# Patient Record
Sex: Female | Born: 1955 | Hispanic: No | Marital: Married | Smoking: Former smoker
Health system: Southern US, Community
[De-identification: ages and names within clinical notes are randomized; demographics above are authoritative.]

## PROBLEM LIST (undated history)

## (undated) DIAGNOSIS — T8859XA Other complications of anesthesia, initial encounter: Secondary | ICD-10-CM

## (undated) DIAGNOSIS — F419 Anxiety disorder, unspecified: Secondary | ICD-10-CM

## (undated) DIAGNOSIS — I10 Essential (primary) hypertension: Secondary | ICD-10-CM

## (undated) DIAGNOSIS — I509 Heart failure, unspecified: Secondary | ICD-10-CM

## (undated) DIAGNOSIS — I341 Nonrheumatic mitral (valve) prolapse: Secondary | ICD-10-CM

## (undated) DIAGNOSIS — I5032 Chronic diastolic (congestive) heart failure: Secondary | ICD-10-CM

## (undated) DIAGNOSIS — T1491XA Suicide attempt, initial encounter: Secondary | ICD-10-CM

## (undated) DIAGNOSIS — K219 Gastro-esophageal reflux disease without esophagitis: Secondary | ICD-10-CM

## (undated) DIAGNOSIS — O24410 Gestational diabetes mellitus in pregnancy, diet controlled: Secondary | ICD-10-CM

## (undated) DIAGNOSIS — R55 Syncope and collapse: Secondary | ICD-10-CM

## (undated) DIAGNOSIS — F32A Depression, unspecified: Secondary | ICD-10-CM

## (undated) DIAGNOSIS — R7302 Impaired glucose tolerance (oral): Secondary | ICD-10-CM

## (undated) DIAGNOSIS — I639 Cerebral infarction, unspecified: Secondary | ICD-10-CM

## (undated) DIAGNOSIS — T4145XA Adverse effect of unspecified anesthetic, initial encounter: Secondary | ICD-10-CM

## (undated) DIAGNOSIS — E785 Hyperlipidemia, unspecified: Secondary | ICD-10-CM

## (undated) DIAGNOSIS — T7840XA Allergy, unspecified, initial encounter: Secondary | ICD-10-CM

## (undated) DIAGNOSIS — J449 Chronic obstructive pulmonary disease, unspecified: Secondary | ICD-10-CM

## (undated) DIAGNOSIS — IMO0002 Reserved for concepts with insufficient information to code with codable children: Secondary | ICD-10-CM

## (undated) DIAGNOSIS — M51369 Other intervertebral disc degeneration, lumbar region without mention of lumbar back pain or lower extremity pain: Secondary | ICD-10-CM

## (undated) DIAGNOSIS — D649 Anemia, unspecified: Secondary | ICD-10-CM

## (undated) DIAGNOSIS — F319 Bipolar disorder, unspecified: Secondary | ICD-10-CM

## (undated) DIAGNOSIS — F329 Major depressive disorder, single episode, unspecified: Secondary | ICD-10-CM

## (undated) DIAGNOSIS — O24919 Unspecified diabetes mellitus in pregnancy, unspecified trimester: Secondary | ICD-10-CM

## (undated) DIAGNOSIS — M5136 Other intervertebral disc degeneration, lumbar region: Secondary | ICD-10-CM

## (undated) HISTORY — DX: Other intervertebral disc degeneration, lumbar region without mention of lumbar back pain or lower extremity pain: M51.369

## (undated) HISTORY — PX: BLADDER SUSPENSION: SHX72

## (undated) HISTORY — DX: Gestational diabetes mellitus in pregnancy, diet controlled: O24.410

## (undated) HISTORY — DX: Chronic diastolic (congestive) heart failure: I50.32

## (undated) HISTORY — PX: AUGMENTATION MAMMAPLASTY: SUR837

## (undated) HISTORY — DX: Allergy, unspecified, initial encounter: T78.40XA

## (undated) HISTORY — DX: Bipolar disorder, unspecified: F31.9

## (undated) HISTORY — DX: Cerebral infarction, unspecified: I63.9

## (undated) HISTORY — DX: Anemia, unspecified: D64.9

## (undated) HISTORY — DX: Suicide attempt, initial encounter: T14.91XA

## (undated) HISTORY — DX: Anxiety disorder, unspecified: F41.9

## (undated) HISTORY — DX: Syncope and collapse: R55

## (undated) HISTORY — DX: Heart failure, unspecified: I50.9

## (undated) HISTORY — DX: Hyperlipidemia, unspecified: E78.5

## (undated) HISTORY — PX: REPAIR PERONEAL TENDONS ANKLE: SUR1201

## (undated) HISTORY — DX: Impaired glucose tolerance (oral): R73.02

## (undated) HISTORY — DX: Essential (primary) hypertension: I10

## (undated) HISTORY — DX: Other intervertebral disc degeneration, lumbar region: M51.36

## (undated) HISTORY — PX: PARTIAL HYSTERECTOMY: SHX80

## (undated) HISTORY — DX: Depression, unspecified: F32.A

## (undated) HISTORY — PX: GASTRIC FUNDOPLICATION: SHX226

## (undated) HISTORY — DX: Chronic obstructive pulmonary disease, unspecified: J44.9

## (undated) HISTORY — PX: GASTRIC BYPASS: SHX52

---
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
## 1977-10-07 HISTORY — PX: TOTAL ABDOMINAL HYSTERECTOMY: SHX209

## 1977-10-07 HISTORY — PX: PARTIAL HYSTERECTOMY: SHX80

## 1999-06-03 ENCOUNTER — Emergency Department (HOSPITAL_COMMUNITY): Admission: EM | Admit: 1999-06-03 | Discharge: 1999-06-03 | Payer: Self-pay | Admitting: Emergency Medicine

## 2000-10-07 HISTORY — PX: BLADDER SUSPENSION: SHX72

## 2001-10-07 HISTORY — PX: AUGMENTATION MAMMAPLASTY: SUR837

## 2006-10-07 HISTORY — PX: REPAIR PERONEAL TENDONS ANKLE: SUR1201

## 2007-07-15 ENCOUNTER — Ambulatory Visit: Payer: Self-pay | Admitting: Family Medicine

## 2007-07-15 DIAGNOSIS — R5381 Other malaise: Secondary | ICD-10-CM | POA: Insufficient documentation

## 2007-07-15 DIAGNOSIS — R5383 Other fatigue: Secondary | ICD-10-CM

## 2007-07-15 DIAGNOSIS — E785 Hyperlipidemia, unspecified: Secondary | ICD-10-CM | POA: Insufficient documentation

## 2007-07-20 ENCOUNTER — Encounter: Payer: Self-pay | Admitting: Family Medicine

## 2007-07-21 ENCOUNTER — Encounter: Payer: Self-pay | Admitting: Family Medicine

## 2007-07-21 LAB — CONVERTED CEMR LAB
ALT: 9 units/L (ref 0–35)
AST: 13 units/L (ref 0–37)
Albumin: 4.1 g/dL (ref 3.5–5.2)
Alkaline Phosphatase: 63 units/L (ref 39–117)
BUN: 9 mg/dL (ref 6–23)
CO2: 23 meq/L (ref 19–32)
Calcium: 9.2 mg/dL (ref 8.4–10.5)
Chloride: 103 meq/L (ref 96–112)
Cholesterol, target level: 200 mg/dL
Cholesterol: 190 mg/dL (ref 0–200)
Creatinine, Ser: 0.76 mg/dL (ref 0.40–1.20)
Glucose, Bld: 100 mg/dL — ABNORMAL HIGH (ref 70–99)
HCT: 44.9 % (ref 36.0–46.0)
HDL goal, serum: 40 mg/dL
HDL: 50 mg/dL (ref >39)
Hemoglobin: 14.7 g/dL (ref 12.0–15.0)
LDL Cholesterol: 115 mg/dL — ABNORMAL HIGH (ref 0–99)
LDL Goal: 160 mg/dL
MCHC: 32.7 g/dL (ref 30.0–36.0)
MCV: 89.4 fL (ref 78.0–100.0)
Platelets: 299 10*3/uL (ref 150–400)
Potassium: 4.2 meq/L (ref 3.5–5.3)
RBC: 5.02 M/uL (ref 3.87–5.11)
RDW: 12.8 % (ref 11.5–14.0)
Sodium: 138 meq/L (ref 135–145)
TSH: 0.822 microintl units/mL (ref 0.350–5.50)
Total Bilirubin: 0.5 mg/dL (ref 0.3–1.2)
Total CHOL/HDL Ratio: 3.8
Total Protein: 6.8 g/dL (ref 6.0–8.3)
Triglycerides: 124 mg/dL (ref <150)
VLDL: 25 mg/dL (ref 0–40)
Vitamin B-12: 331 pg/mL (ref 211–911)
WBC: 10.4 10*3/uL (ref 4.0–10.5)

## 2007-07-24 ENCOUNTER — Ambulatory Visit: Payer: Self-pay | Admitting: Family Medicine

## 2007-12-14 ENCOUNTER — Encounter: Payer: Self-pay | Admitting: Family Medicine

## 2007-12-22 ENCOUNTER — Telehealth: Payer: Self-pay | Admitting: Family Medicine

## 2007-12-22 ENCOUNTER — Ambulatory Visit: Payer: Self-pay | Admitting: Family Medicine

## 2007-12-22 DIAGNOSIS — R7309 Other abnormal glucose: Secondary | ICD-10-CM | POA: Insufficient documentation

## 2008-01-06 ENCOUNTER — Telehealth (INDEPENDENT_AMBULATORY_CARE_PROVIDER_SITE_OTHER): Payer: Self-pay | Admitting: *Deleted

## 2008-01-06 ENCOUNTER — Encounter: Payer: Self-pay | Admitting: Family Medicine

## 2008-01-07 ENCOUNTER — Ambulatory Visit: Payer: Self-pay | Admitting: Family Medicine

## 2008-01-11 ENCOUNTER — Telehealth: Payer: Self-pay | Admitting: Family Medicine

## 2008-01-11 ENCOUNTER — Encounter: Payer: Self-pay | Admitting: Family Medicine

## 2008-02-04 ENCOUNTER — Ambulatory Visit: Payer: Self-pay | Admitting: Family Medicine

## 2008-04-24 ENCOUNTER — Encounter: Payer: Self-pay | Admitting: Family Medicine

## 2008-05-05 ENCOUNTER — Telehealth: Payer: Self-pay | Admitting: Family Medicine

## 2008-06-22 ENCOUNTER — Encounter: Payer: Self-pay | Admitting: Family Medicine

## 2008-07-06 ENCOUNTER — Encounter: Admission: RE | Admit: 2008-07-06 | Discharge: 2008-07-06 | Payer: Self-pay | Admitting: Obstetrics and Gynecology

## 2008-07-29 ENCOUNTER — Encounter: Payer: Self-pay | Admitting: Family Medicine

## 2008-07-31 LAB — CONVERTED CEMR LAB
BUN: 9 mg/dL (ref 6–23)
CO2: 22 meq/L (ref 19–32)
Calcium: 9.2 mg/dL (ref 8.4–10.5)
Chloride: 104 meq/L (ref 96–112)
Creatinine, Ser: 0.69 mg/dL (ref 0.40–1.20)
Glucose, Bld: 155 mg/dL — ABNORMAL HIGH (ref 70–99)
Potassium: 3.5 meq/L (ref 3.5–5.3)
Sodium: 138 meq/L (ref 135–145)

## 2008-08-03 ENCOUNTER — Encounter: Admission: RE | Admit: 2008-08-03 | Discharge: 2008-08-03 | Payer: Self-pay | Admitting: Family Medicine

## 2008-08-03 ENCOUNTER — Ambulatory Visit: Payer: Self-pay | Admitting: Family Medicine

## 2008-08-03 DIAGNOSIS — M79609 Pain in unspecified limb: Secondary | ICD-10-CM | POA: Insufficient documentation

## 2008-08-03 LAB — CONVERTED CEMR LAB: Hgb A1c MFr Bld: 5.6 %

## 2008-08-21 ENCOUNTER — Encounter: Payer: Self-pay | Admitting: Family Medicine

## 2008-08-31 ENCOUNTER — Telehealth: Payer: Self-pay | Admitting: Family Medicine

## 2008-08-31 DIAGNOSIS — G479 Sleep disorder, unspecified: Secondary | ICD-10-CM | POA: Insufficient documentation

## 2008-10-27 ENCOUNTER — Encounter: Payer: Self-pay | Admitting: Family Medicine

## 2008-11-07 ENCOUNTER — Telehealth: Payer: Self-pay | Admitting: Family Medicine

## 2008-12-13 ENCOUNTER — Ambulatory Visit: Payer: Self-pay | Admitting: Family Medicine

## 2008-12-13 LAB — CONVERTED CEMR LAB
Creatinine,U: 200 mg/dL
Hgb A1c MFr Bld: 5.9 %
Microalbumin U total vol: 80 mg/L

## 2008-12-21 ENCOUNTER — Telehealth: Payer: Self-pay | Admitting: Family Medicine

## 2008-12-23 ENCOUNTER — Encounter: Payer: Self-pay | Admitting: Family Medicine

## 2009-05-02 ENCOUNTER — Ambulatory Visit: Payer: Self-pay | Admitting: Family Medicine

## 2009-05-03 ENCOUNTER — Encounter: Payer: Self-pay | Admitting: Family Medicine

## 2009-05-04 LAB — CONVERTED CEMR LAB
ALT: 9 units/L (ref 0–35)
AST: 11 units/L (ref 0–37)
Albumin: 4 g/dL (ref 3.5–5.2)
Alkaline Phosphatase: 90 units/L (ref 39–117)
BUN: 7 mg/dL (ref 6–23)
CO2: 23 meq/L (ref 19–32)
Calcium: 9.5 mg/dL (ref 8.4–10.5)
Chloride: 102 meq/L (ref 96–112)
Cholesterol: 199 mg/dL (ref 0–200)
Creatinine, Ser: 0.76 mg/dL (ref 0.40–1.20)
Glucose, Bld: 103 mg/dL — ABNORMAL HIGH (ref 70–99)
HDL: 38 mg/dL — ABNORMAL LOW (ref >39)
LDL Cholesterol: 127 mg/dL — ABNORMAL HIGH (ref 0–99)
Potassium: 4 meq/L (ref 3.5–5.3)
Sodium: 138 meq/L (ref 135–145)
TSH: 1.465 microintl units/mL (ref 0.350–4.500)
Total Bilirubin: 0.5 mg/dL (ref 0.3–1.2)
Total CHOL/HDL Ratio: 5.2
Total Protein: 6.6 g/dL (ref 6.0–8.3)
Triglycerides: 172 mg/dL — ABNORMAL HIGH (ref <150)
VLDL: 34 mg/dL (ref 0–40)

## 2009-05-05 ENCOUNTER — Telehealth: Payer: Self-pay | Admitting: Family Medicine

## 2009-05-17 ENCOUNTER — Encounter: Payer: Self-pay | Admitting: Cardiology

## 2009-05-17 ENCOUNTER — Ambulatory Visit: Payer: Self-pay | Admitting: Cardiology

## 2009-05-17 DIAGNOSIS — F319 Bipolar disorder, unspecified: Secondary | ICD-10-CM | POA: Insufficient documentation

## 2009-05-17 DIAGNOSIS — R0602 Shortness of breath: Secondary | ICD-10-CM | POA: Insufficient documentation

## 2009-05-17 DIAGNOSIS — R079 Chest pain, unspecified: Secondary | ICD-10-CM | POA: Insufficient documentation

## 2009-05-17 HISTORY — DX: Bipolar disorder, unspecified: F31.9

## 2009-05-22 ENCOUNTER — Ambulatory Visit: Payer: Self-pay | Admitting: Cardiology

## 2009-05-22 ENCOUNTER — Telehealth: Payer: Self-pay | Admitting: Cardiology

## 2009-05-25 ENCOUNTER — Telehealth (INDEPENDENT_AMBULATORY_CARE_PROVIDER_SITE_OTHER): Payer: Self-pay | Admitting: Radiology

## 2009-05-29 ENCOUNTER — Encounter: Payer: Self-pay | Admitting: Cardiology

## 2009-05-29 ENCOUNTER — Ambulatory Visit: Payer: Self-pay

## 2009-05-31 ENCOUNTER — Encounter: Payer: Self-pay | Admitting: Cardiology

## 2009-05-31 ENCOUNTER — Ambulatory Visit: Payer: Self-pay

## 2009-06-02 ENCOUNTER — Ambulatory Visit: Payer: Self-pay | Admitting: Family Medicine

## 2009-06-02 DIAGNOSIS — L680 Hirsutism: Secondary | ICD-10-CM | POA: Insufficient documentation

## 2009-07-13 ENCOUNTER — Encounter (INDEPENDENT_AMBULATORY_CARE_PROVIDER_SITE_OTHER): Payer: Self-pay | Admitting: *Deleted

## 2009-08-09 ENCOUNTER — Encounter: Payer: Self-pay | Admitting: Family Medicine

## 2009-08-23 ENCOUNTER — Telehealth: Payer: Self-pay | Admitting: Family Medicine

## 2009-09-06 ENCOUNTER — Ambulatory Visit: Payer: Self-pay | Admitting: Cardiology

## 2009-09-06 ENCOUNTER — Encounter: Payer: Self-pay | Admitting: Cardiology

## 2009-09-07 ENCOUNTER — Encounter: Payer: Self-pay | Admitting: Cardiology

## 2009-09-11 ENCOUNTER — Ambulatory Visit: Payer: Self-pay | Admitting: Cardiology

## 2009-09-15 ENCOUNTER — Encounter (INDEPENDENT_AMBULATORY_CARE_PROVIDER_SITE_OTHER): Payer: Self-pay | Admitting: *Deleted

## 2009-10-11 ENCOUNTER — Encounter: Payer: Self-pay | Admitting: Cardiology

## 2009-10-11 ENCOUNTER — Ambulatory Visit: Payer: Self-pay | Admitting: Cardiology

## 2009-10-20 ENCOUNTER — Encounter: Payer: Self-pay | Admitting: Cardiology

## 2009-10-20 ENCOUNTER — Encounter: Payer: Self-pay | Admitting: Family Medicine

## 2009-11-13 ENCOUNTER — Encounter: Payer: Self-pay | Admitting: Family Medicine

## 2010-03-16 ENCOUNTER — Encounter: Payer: Self-pay | Admitting: Family Medicine

## 2010-07-18 ENCOUNTER — Ambulatory Visit (HOSPITAL_COMMUNITY): Admission: RE | Admit: 2010-07-18 | Discharge: 2010-07-19 | Payer: Self-pay | Admitting: Obstetrics and Gynecology

## 2010-10-22 ENCOUNTER — Encounter (INDEPENDENT_AMBULATORY_CARE_PROVIDER_SITE_OTHER): Payer: Self-pay | Admitting: *Deleted

## 2010-10-22 ENCOUNTER — Telehealth: Payer: Self-pay | Admitting: Cardiology

## 2010-10-22 ENCOUNTER — Inpatient Hospital Stay (HOSPITAL_COMMUNITY)
Admission: EM | Admit: 2010-10-22 | Discharge: 2010-10-23 | Payer: Self-pay | Source: Home / Self Care | Attending: Cardiology | Admitting: Cardiology

## 2010-10-22 ENCOUNTER — Ambulatory Visit: Admit: 2010-10-22 | Payer: Self-pay | Admitting: Cardiology

## 2010-10-24 LAB — CBC
HCT: 43.4 % (ref 36.0–46.0)
HCT: 37.9 % (ref 36.0–46.0)
Hemoglobin: 14.4 g/dL (ref 12.0–15.0)
Hemoglobin: 12.3 g/dL (ref 12.0–15.0)
MCH: 29 pg (ref 26.0–34.0)
MCH: 28.3 pg (ref 26.0–34.0)
MCHC: 33.2 g/dL (ref 30.0–36.0)
MCHC: 32.5 g/dL (ref 30.0–36.0)
MCV: 87.3 fL (ref 78.0–100.0)
MCV: 87.1 fL (ref 78.0–100.0)
Platelets: 272 10*3/uL (ref 150–400)
Platelets: 235 10*3/uL (ref 150–400)
RBC: 4.97 MIL/uL (ref 3.87–5.11)
RBC: 4.35 MIL/uL (ref 3.87–5.11)
RDW: 13.2 % (ref 11.5–15.5)
RDW: 13.3 % (ref 11.5–15.5)
WBC: 11.8 10*3/uL — ABNORMAL HIGH (ref 4.0–10.5)
WBC: 8.5 10*3/uL (ref 4.0–10.5)

## 2010-10-24 LAB — BASIC METABOLIC PANEL
BUN: 5 mg/dL — ABNORMAL LOW (ref 6–23)
BUN: 9 mg/dL (ref 6–23)
CO2: 27 mEq/L (ref 19–32)
CO2: 29 mEq/L (ref 19–32)
Calcium: 9.3 mg/dL (ref 8.4–10.5)
Calcium: 9 mg/dL (ref 8.4–10.5)
Chloride: 105 mEq/L (ref 96–112)
Chloride: 105 mEq/L (ref 96–112)
Creatinine, Ser: 0.76 mg/dL (ref 0.4–1.2)
Creatinine, Ser: 0.82 mg/dL (ref 0.4–1.2)
GFR calc Af Amer: >60 mL/min (ref >60)
GFR calc Af Amer: >60 mL/min (ref >60)
GFR calc non Af Amer: >60 mL/min (ref >60)
GFR calc non Af Amer: >60 mL/min (ref >60)
Glucose, Bld: 97 mg/dL (ref 70–99)
Glucose, Bld: 104 mg/dL — ABNORMAL HIGH (ref 70–99)
Potassium: 3.7 mEq/L (ref 3.5–5.1)
Potassium: 3.4 mEq/L — ABNORMAL LOW (ref 3.5–5.1)
Sodium: 143 mEq/L (ref 135–145)
Sodium: 141 mEq/L (ref 135–145)

## 2010-10-24 LAB — DIFFERENTIAL
Basophils Absolute: 0.1 10*3/uL (ref 0.0–0.1)
Basophils Relative: 1 % (ref 0–1)
Eosinophils Absolute: 0.4 10*3/uL (ref 0.0–0.7)
Eosinophils Relative: 3 % (ref 0–5)
Lymphocytes Relative: 22 % (ref 12–46)
Lymphs Abs: 2.6 10*3/uL (ref 0.7–4.0)
Monocytes Absolute: 0.7 10*3/uL (ref 0.1–1.0)
Monocytes Relative: 6 % (ref 3–12)
Neutro Abs: 8.1 10*3/uL — ABNORMAL HIGH (ref 1.7–7.7)
Neutrophils Relative %: 68 % (ref 43–77)

## 2010-10-24 LAB — CK TOTAL AND CKMB (NOT AT ARMC)
CK, MB: 1.3 ng/mL (ref 0.3–4.0)
Relative Index: INVALID (ref 0.0–2.5)
Total CK: 86 U/L (ref 7–177)

## 2010-10-24 LAB — TROPONIN I
Troponin I: 0.02 ng/mL (ref 0.00–0.06)
Troponin I: <0.01 ng/mL (ref 0.00–0.06)

## 2010-10-24 LAB — TSH: TSH: 1.36 u[IU]/mL (ref 0.350–4.500)

## 2010-10-24 LAB — MRSA PCR SCREENING: MRSA by PCR: NEGATIVE

## 2010-10-28 ENCOUNTER — Encounter: Payer: Self-pay | Admitting: Cardiology

## 2010-11-06 NOTE — Letter (Signed)
Summary: [facility]  [facility]   Imported By: Lanelle Bal 04/12/2010 13:26:51  _____________________________________________________________________  External Attachment:    Type:   Image     Comment:   External Document

## 2010-11-06 NOTE — Letter (Signed)
Summary: [facility] General Surgery Note  [facility] General Surgery Note   Imported By: Roderic Ovens 02/07/2010 11:43:38  _____________________________________________________________________  External Attachment:    Type:   Image     Comment:   External Document

## 2010-11-06 NOTE — Assessment & Plan Note (Signed)
Summary: [facility]   Visit Type:  6 weeks follow up Primary Provider:  Linford Arnold, C  CC:  No complains.  History of Present Illness: Pleasant female recently seen for hypertension, shortness of breath and chest pain. A nuclear study was performed on 05/29/09 and showed normal perfusion and normal LV function with an ejection fraction of 64%. An echocardiogram was performed on May 29, 2009 and showed normal LV function. Renal Dopplers were technically difficult. There was no stenosis on the left and the right was not well visualized. I last saw her earlier this month we scheduled a CTA to rule out renal artery stenosis and it was negative. We also added hydralazine to her medical regimen. A BNP was also ordered for dyspnea and was normal. Since I last saw her she has some dyspnea on exertion relieved with rest. It is improved compared to previous. It is not associated with chest pain. There is no pedal edema. She has not had palpitations or syncope. She did have foot surgery recently.  Current Medications (verified): 1)  Adult Aspirin Ec Low Strength 81 Mg  Tbec (Aspirin) .... Take 1 Tablet By Mouth Once A Day 2)  Furosemide 20 Mg  Tabs (Furosemide) .... Take 1 Tablet By Mouth Two Times A Day 3)  Klonopin 1 Mg  Tabs (Clonazepam) .... Take 1 Tablet By Mouth Three Times A Day 4)  Geodon 80 Mg Caps (Ziprasidone Hcl) .... Take 1 Tablet By Mouth Two Times A Day 5)  Cymbalta 60 Mg Cpep (Duloxetine Hcl) .... Take 1 Tablet By Mouth Once A Day 6)  Deplin 7.5 Mg  Tabs (L-Methylfolate) .... Take One Tablet By Mouth Once A Day 7)  Guanfacine Hcl 2 Mg  Tabs (Guanfacine Hcl) .... Take One Tablet By Mouth At Bedtime 8)  Ambien Cr 12.5 Mg  Tbcr (Zolpidem Tartrate) .... Take One Tablet By Mouth At Bedtime 9)  Azor 10-40 Mg Tabs (Amlodipine-Olmesartan) .... Take 1 Tablet By Mouth Once A Day 10)  Enalapril Maleate 20 Mg  Tabs (Enalapril Maleate) .... Take 2  Tablet By Mouth Once A Day 11)  Spironolactone  50 Mg Tabs (Spironolactone) .... Take 1 Tablet By Mouth Once A Day 12)  Clonidine Hcl 0.2 Mg Tabs (Clonidine Hcl) .... Take Two Tablets By Mouth Three Times A Day 13)  Pravastatin Sodium 20 Mg Tabs (Pravastatin Sodium) .... Take 1 Tablet By Mouth Once A Day At Bedtime 14)  Carvedilol 25 Mg Tabs (Carvedilol) .... Take 1 Tablet By Mouth Two Times A Day 15)  Percocet 7.5-500 Mg Tabs (Oxycodone-Acetaminophen) .... As Needed 16)  Hydralazine Hcl 25 Mg Tabs (Hydralazine Hcl) .... Take One Tablet By Mouth Three Times A Day  Allergies: 1)  ! Codeine  Past History:  Past Medical History: Reviewed history from 09/06/2009 and no changes required. Current Problems:  HYPERLIPIDEMIA NEC/NOS (ICD-272.4) HYPERTENSION, BENIGN ESSENTIAL (ICD-401.1) SLEEP DISORDER (ICD-780.50) OTHER ABNORMAL GLUCOSE (ICD-790.29) FATIGUE (ICD-780.79) H/O CHF; [facility]osptialized at [facility] Bipolar Disorder Hx of suicide attempt.  Past Surgical History: Reviewed history from 05/17/2009 and no changes required. Partial hysterectomy  Social History: Reviewed history from 08/03/2008 and no changes required. Married to [employer] with 2 adult children.   Former Smoker Alcohol use-yes Drug use-no Regular exercise-yes  Review of Systems       Pain in the Left Foot from Recent Surgery but no fevers or chills, productive cough, hemoptysis, dysphasia, odynophagia, melena, hematochezia, dysuria, hematuria, rash, seizure activity, orthopnea, PND, pedal edema, claudication. Remaining  systems are negative.   Vital Signs:  Patient profile:   55 year old female Height:      65 inches Weight:      235 pounds BMI:     39.25 Pulse rate:   76 / minute Pulse rhythm:   regular Resp:     18 per minute BP sitting:   149 / 90  (right arm) Cuff size:   large  Vitals Entered By: Vikki Ports (October 11, 2009 3:08 PM)  Physical Exam  General:  Well-developed well-nourished in no acute distress.  Skin is warm  and dry.  HEENT is normal.  Neck is supple. No thyromegaly.  Chest is clear to auscultation with normal expansion.  Cardiovascular exam is regular rate and rhythm.  Abdominal exam nontender or distended. No masses palpated. Extremities show no edema. neuro grossly intact    Impression & Recommendations:  Problem # 1:  DYSPNEA (ICD-786.05) She is not volume overloaded on examination and recent BNP normal. I do not think this is cardiac related. Her updated medication list for this problem includes:    Adult Aspirin Ec Low Strength 81 Mg Tbec (Aspirin) .[name]... Take 1 tablet by mouth once a day    Furosemide 20 Mg Tabs (Furosemide) .[name]... Take 1 tablet by mouth two times a day    Azor 10-40 Mg Tabs (Amlodipine-olmesartan) .[name]... Take 1 tablet by mouth once a day    Enalapril Maleate 20 Mg Tabs (Enalapril maleate) .[name]... Take 2  tablet by mouth once a day    Spironolactone 50 Mg Tabs (Spironolactone) .[name]... Take 1 tablet by mouth once a day    Carvedilol 25 Mg Tabs (Carvedilol) .[name]... Take 1 tablet by mouth two times a day  Problem # 2:  HYPERLIPIDEMIA NEC/NOS (ICD-272.4) Continue statin. Lipids and liver monitored by her primary care. Her updated medication list for this problem includes:    Pravastatin Sodium 20 Mg Tabs (Pravastatin sodium) .[name]... Take 1 tablet by mouth once a day at bedtime  Problem # 3:  HYPERTENSION, BENIGN ESSENTIAL (ICD-401.1) Her blood pressure is improved but remains elevated. Increase hydralazine to 50 mg p.o. t.i.d. Her updated medication list for this problem includes:    Adult Aspirin Ec Low Strength 81 Mg Tbec (Aspirin) .[name]... Take 1 tablet by mouth once a day    Furosemide 20 Mg Tabs (Furosemide) .[name]... Take 1 tablet by mouth two times a day    Guanfacine Hcl 2 Mg Tabs (Guanfacine hcl) .[name]... Take one tablet by mouth at bedtime    Azor 10-40 Mg Tabs (Amlodipine-olmesartan) .[name]... Take 1 tablet by mouth once a day    Enalapril Maleate 20 Mg Tabs (Enalapril maleate)  .[name]... Take 2  tablet by mouth once a day    Spironolactone 50 Mg Tabs (Spironolactone) .[name]... Take 1 tablet by mouth once a day    Clonidine Hcl 0.2 Mg Tabs (Clonidine hcl) .[name]... Take two tablets by mouth three times a day    Carvedilol 25 Mg Tabs (Carvedilol) .[name]... Take 1 tablet by mouth two times a day    Hydralazine Hcl 50 Mg Tabs (Hydralazine hcl) .[name]... Take one tablet by mouth three times a day  Problem # 4:  CHEST PAIN UNSPECIFIED (ICD-786.50) No further chest pain. Recent Myoview negative. No further cardiac workup. Her updated medication list for this problem includes:    Adult Aspirin Ec Low Strength 81 Mg Tbec (Aspirin) .[name]... Take 1 tablet by mouth once a day    Azor  10-40 Mg Tabs (Amlodipine-olmesartan) .[name]... Take 1 tablet by mouth once a day    Enalapril Maleate 20 Mg Tabs (Enalapril maleate) .[name]... Take 2  tablet by mouth once a day    Carvedilol 25 Mg Tabs (Carvedilol) .[name]... Take 1 tablet by mouth two times a day  Problem # 5:  BIPOLAR DISORDER UNSPECIFIED (ICD-296.80)  Patient Instructions: 1)  Your physician recommends that you schedule a follow-up appointment in: 3 months 2)  Your physician has recommended you make the following change in your medication: increase hydralazine to 50mg  one tablet three times daily Prescriptions: HYDRALAZINE HCL 50 MG TABS (HYDRALAZINE HCL) Take one tablet by mouth three times a day  #90 x 12   Entered by:   Deliah Goody, RN   Authorized by:   Ferman Hamming, MD, [facility]   Signed by:   Deliah Goody, RN on 10/11/2009   Method used:   Electronically to        [facility] [address] # [phone]* (retail)       [address]       [place], [place]  [address]       Ph: [phone]       Fax: [phone]   RxID:   [id number]

## 2010-11-06 NOTE — Letter (Signed)
Summary: [facility] General Surgery  [facility] General Surgery   Imported By: Lanelle Bal 01/20/2010 10:58:54  _____________________________________________________________________  External Attachment:    Type:   Image     Comment:   External Document

## 2010-11-06 NOTE — Medication Information (Signed)
Summary: [facility]  [facility]   Imported By: Lanelle Bal 11/17/2009 12:34:21  _____________________________________________________________________  External Attachment:    Type:   Image     Comment:   External Document

## 2010-11-08 NOTE — Letter (Signed)
Summary: ER Notification  [facility], Main Office  [address]   [place], [place] [address]   Phone: [phone]  Fax: [phone]    October 22, 2010 10:46 AM  [name]  The above referenced patient has been advised to report directly to the Emergency Room. Please see below for more information:  Dx: recurrent chest pain     Private Vehicle  _____xxxxxx__________    Orders:  Yes ______ or No  _______   Notify upon arrival:     Trish [phone]       Or _________________   Thank you,     [facility]

## 2010-11-08 NOTE — Progress Notes (Signed)
Summary: pt having chest discomfort/weakness  Phone Note Call from Patient   Caller: Patient [phone] Reason for Call: Talk to Nurse Summary of Call: pt had chest pain thursday-went to er sat night, gave her nitro-today feels weak, some chest discomfort, bp 160/108 this am-pls advise Initial call taken by: Glynda Jaeger,  October 22, 2010 9:14 AM  Follow-up for Phone Call        spoke with pt, she had chest pain over the weekend and went to the [facility]. she reports they wanted to admit her due to an abnormal EKG. she reports they told her that her blood work she was not having a MI but that she probably needed a cath. she refused admission and went home. she had another episode of chest pain on sunday and NTG relieved the pain. today she feels very fatiqued and just doesn't feel right in her chest. discussed with dr Jens Som, pt instructed to go to [place]. she will have her husband drive her Deliah Goody, RN  October 22, 2010 10:45 AM

## 2010-11-12 NOTE — Discharge Summary (Signed)
  [name], [name]                ACCOUNT NO.:  [id number]  MEDICAL RECORD NO.:  [id number]          PATIENT TYPE:  INP  LOCATION:  2603                         FACILITY:  [facility]  PHYSICIAN:  Madolyn Frieze. Jens Som, MD, FACCDATE OF BIRTH:  01-13-1956  DATE OF ADMISSION:  10/22/2010 DATE OF DISCHARGE:  10/23/2010                              DISCHARGE SUMMARY   ADDENDUM:  Job #[id number].  Please refer to that dictation for complete details.  This is an updated patient's discharge medication list.  She has informed us that she no longer takes Geodon and therefore we have stricken that from her discharge medication list.     Nicolasa Ducking, ANP   ______________________________ Madolyn Frieze. Jens Som, MD, [facility]    CB/MEDQ  D:  10/23/2010  T:  10/24/2010  Job:  [id number]  Electronically Signed by Nicolasa Ducking ANP on 11/05/2010 12:24:05 PM Electronically Signed by Olga Millers MD [facility] on 11/12/2010 08:10:39 AM

## 2010-11-12 NOTE — Discharge Summary (Signed)
[name], [name]                ACCOUNT NO.:  [id number]  MEDICAL RECORD NO.:  [id number]          PATIENT TYPE:  INP  LOCATION:  2603                         FACILITY:  [facility]  PHYSICIAN:  Madolyn Frieze. Jens Som, MD, FACCDATE OF BIRTH:  January 16, 1956  DATE OF ADMISSION:  10/22/2010 DATE OF DISCHARGE:  10/23/2010                              DISCHARGE SUMMARY   PRIMARY CARDIOLOGIST:  Madolyn Frieze. Jens Som, MD, [facility]  PRIMARY CARE DOCTOR:  Nani Gasser, MD in [place].  DISCHARGE DIAGNOSIS:  Hypertensive urgency.  SECONDARY DIAGNOSES: 1. Chest pain without objective evidence of ischemia. 2. Hyperlipidemia. 3. Questionable medication compliance. 4. Bipolar disorder with history of suicide attempts. 5. History of sleep disorder. 6. Symptomatic cystocele/rectocele and urinary incontinence, status     post sling, October 2011. 7. Status post partial hysterectomy. 8. Status post hip surgery.  ALLERGIES:  CODEINE.  PROCEDURES:  CT angio of the chest revealing no evidence of pulmonary embolism.  Normal thoracic aorta.  No acute pulmonary findings, though small scattered pulmonary nodules were noted with recommendation for followup noncontrast chest CT in 6 months.  HISTORY OF PRESENT ILLNESS:  A 55 year old female with prior history of hypertension and atypical chest pain, status post normal Myoview in August 2010, who was in her usual state of health until late last week, when she developed mild chest discomfort with radiation to her back associated with mild dyspnea.  Discomfort persisted over the subsequent 3 days and were worsened by deep inspiration, exertion and while lying supine.  She presented to [facility] on October 20, 2010 where she apparently relieved after being treated with nitroglycerin.  She refused admission.  On the morning of October 22, 2010, the patient called our office and was instructed to present to the ED for evaluation.  In the ED,  she was hypertensive with pressure was high at 210/123.  ECG showed LVH, but otherwise no acute changes.  She was admitted for further evaluation.  HOSPITAL COURSE:  The patient was hypertensive and placed on intravenous nitroglycerin.  Her home medication regimen was adjusted and according to pharmacy here, after inquire about her medicines through her local pharmacy, it appeared that many of her home medicines were not been filled in quite some time.  With reinstitution of her medications, blood pressure was improved and we were able to discontinue IV nitroglycerin this morning.  Pressure has since been stable in the one teens while cardiac markers have remained negative.  Plan to discharge the patient today in good condition and plan for follow up with Dr. Jens Som in the outpatient setting.  Of note, she was mildly hyperkalemic this morning and as a result, we produced her Lasix dose and plan to follow up basic metabolic panel this Thursday, on October 25, 2010, at which point, decision can be made whether or not the patient will require potassium supplementation going forward.  DISCHARGE LABS:  Hemoglobin 12.3, hematocrit 37.9, WBC 8.5, platelets 235.  Sodium 141, potassium 3.4, chloride 105, CO2 29, BUN 9, creatinine 0.82, glucose 104, calcium 9.0.  CK 86, MB 1.3, troponin-I less than 0.01.  TSH 1.360.  MRSA screen was negative.  DISPOSITION:  The patient will be discharged home today in good condition.  FOLLOWUP PLANS AND APPOINTMENTS:  The patient will have basic metabolic panel on Thursday, October 25, 2010 with her primary care provider.  A prescription has been provided.  She will follow up with Dr. Jens Som on November 21, 2010 at 2:45 p.m. or sooner if necessary.  DISCHARGE MEDICATIONS: 1. Clonazepam 1 mg t.i.d. 2. Duloxetine 60 mg daily. 3. Hydralazine 50 mg t.i.d. 4. Nifedipine XL 60 mg two tablets daily. 5. Geodon 80 mg b.i.d. 6. Enalapril 20 mg b.i.d. 7.  Furosemide 20 mg daily. 8. Aspirin 81 mg daily. 9. Carvedilol 25 mg b.i.d. 10.Clonidine 0.2 mg two tablets t.i.d. 11.Multivitamin one tablet daily. 12.Pravachol 20 mg nightly. 13.Prilosec 20 mg b.i.d. 14.Spirolactone 25 mg daily. 15.Vitamin B12 one tablet daily. 16.Vitamin B6 one tablet daily.  OUTSTANDING LABORATORY STUDIES:  Follow up basic metabolic panel on October 25, 2010.  DURATION OF DISCHARGE ENCOUNTER:  Forty minutes including physician time.     Nicolasa Ducking, ANP   ______________________________ Madolyn Frieze. Jens Som, MD, [facility]    CB/MEDQ  D:  10/23/2010  T:  10/24/2010  Job:  [id number]  cc:   Nani Gasser, M.D.  Electronically Signed by Nicolasa Ducking ANP on 11/05/2010 12:23:46 PM Electronically Signed by Olga Millers MD [facility] on 11/12/2010 08:10:36 AM

## 2010-11-15 NOTE — H&P (Signed)
[name], JANOSKI NO.:  [id number]  MEDICAL RECORD NO.:  [id number]          PATIENT TYPE:  INP  LOCATION:  2603                         FACILITY:  [facility]  PHYSICIAN:  Marca Ancona, MD      DATE OF BIRTH:  01/10/1956  DATE OF ADMISSION:  10/22/2010 DATE OF DISCHARGE:                             HISTORY & PHYSICAL   PRIMARY CARDIOLOGIST:  Madolyn Frieze. Jens Som, MD, [facility]  PRIMARY CARE PHYSICIAN:  Nani Gasser, MD  PLAN:  Chest pain.  HISTORY OF PRESENT ILLNESS:  Ms. [name] is a 55 year old African American female with a known history of severe hypertension and atypical chest discomfort, previously ruled out for ischemia with a negative Myoview in August 2010 and a normally EF on echocardiogram at that same time and ruled out for renal artery stenosis with CTA in December 2010 as well as history significant for hyperlipidemia, chronic back pain, and bipolar disorder (history of suicide attempts) who presents with recurrence of her atypical chest discomfort that had sudden onset in the setting of continued poorly controlled hypertension with reported excellent compliance with her many antihypertensive medications.  The patient was in her usual state of health until she went to see her gastroenterologist last Thursday and she reported some mild chest discomfort similar to the chest pain that brought her in, but much less severe and not lasting very long at all.  On her drive home from her gastroenterologist office (no procedures completed at that appointment), the patient had sudden onset of much more severe left-sided chest discomfort with radiation to her back associated with slightly worsening of her chronic dyspnea (very mild, at baseline).  The patient tried to ignore these symptoms, but unfortunately they continued for the next 3 days seemingly worse with deep inspiration, exertion, and while lying supine.  She eventually presented to [facility] and was given nitroglycerin.  She reports some relief with this medication.  She refused admission because she feared her insurance would not pay for that stay and called [facility] office this morning, was instructed to present to [facility] ED for further evaluation.  Here in the emergency department, the patient's symptoms have significantly improved with only minimal residual chest discomfort, feeling like slight heaviness, but not quite like her prior symptoms leading to her evaluation at [facility].  She does have significant hypertension with systolic ranging from 180-210 and diastolic ranging from 97-123.  EKG does not show any concerning ST-T wave changes nor it does show LVH (?) and she is in normal sinus and rest of her vital signs are within normal limits and stable.  Labs unremarkable including white count only at 11.8.  Her H&H within normal limits.  BMET without any abnormalities and first full set of cardiac enzymes completely within normal limits.  She is resting comfortably now and just concerned about her elevated blood pressure last check 191/103 (checked in both upper extremities without significant change).  Notes faxed from [facility] evaluation has just come through and the patient had one full set of enzymes that were negative, liver function tests within normal limits as well as  normal BMET, CBC within normal limits.  Chest x-ray without any significant findings and blood pressures consistent with findings here in the emergency department.  D- dimer was negative.  PAST MEDICAL HISTORY: 1. Hypertension (poor to moderate control despite multiple     antihypertensives). 2. Hyperlipidemia. 3. Sleep disorder. 4. Bipolar disorder with history of suicide attempts. 5. History of obesity (72-pound weight loss secondary to diet). 6. Symptomatic cystocele/rectocele/urinary incontinence.     a.     S/p anterior and posterior colporrhaphy, TVT sling,  October      2011. 7. S/p partial hysterectomy. 8. S/p foot surgery.  SOCIAL HISTORY:  The patient lives in [place] with her husband. She has 2 children.  She is unemployed.  She has remote tobacco abuse quitting in 2005.  She rarely consumes EtOH and denies any binge drinking.  No illicit drugs.  No herbal meds.  Follows a high-protein diet, eating 6 small meals a day and no regular exercise, but is independent in terms of all daily activities.  FAMILY HISTORY:  Father had a myocardial infarction at age 46.  The patient also thinks she had an aneurysm at that time and blood clot that went to her brain (?).  She had a paternal grandfather who also had a heart attack, unknown age.  Mother and maternal grandparents all with diabetes and hypertension.  Siblings with no known heart disease.  REVIEW OF SYSTEMS:  Please see HPI.  All other systems are reviewed and are negative.  CODE STATUS:  Full.  ALLERGIES:  CODEINE.  MEDICATIONS: 1. Enteric-coated aspirin 81 mg p.o. daily. 2. Furosemide 20 mg 1 tablet p.o. b.i.d. 3. Klonopin 1 mg p.o. t.i.d. 4. Geodon 80 mg p.o. b.i.d. 5. Cymbalta 60 mg 1 tablet p.o. daily. 6. Deplin 7.5 mg 1 tablet p.o. daily. 7. Guaifenesin 2 mg 1 tablet p.o. nightly. 8. Ambien CR 12.5 mg p.o. nightly. 9. Azor 10-40 one tablet p.o. daily. 10.Enalapril 20 mg 2 tablets p.o. daily. 11.Spironolactone 50 mg 1 tablet daily. 12.Clonidine 0.2 mg 2 tablets p.o. t.i.d. 13.Pravastatin 20 mg p.o. nightly. 14.Carvedilol 25 mg 1 tablet p.o. b.i.d. 15.Percocet 7.5-500 mg p.r.n. 16.Hydralazine 50 mg p.o. t.i.d.  PHYSICAL EXAMINATION:  VITAL SIGNS:  Temp 98.2 degrees Fahrenheit, BP ranging from 180-210 with diastolic of 97-123, pulse 64-72, respiration rate 18-22, and O2 saturation 100% on 2 liters by nasal cannula. GENERAL:  The patient is alert and oriented X3, in no apparent distress, able to speak easily in full sentences without any respiratory  distress. HEENT:  Head is normocephalic and atraumatic.  Pupils are equal, round, and reactive to light.  Extraocular muscles are intact.  Nares are patent without discharge.  Dentition is fair.  Oropharynx is without erythema or exudates. NECK:  Supple without lymphadenopathy.  No JVD.  No bruits. HEART:  Rate is regular with audible S1 and S2.  No clicks, rubs, murmurs, or gallops.  Pulses are 2+ and equal in both upper and lower extremities bilaterally. LUNGS:  Clear to auscultation bilaterally. SKIN:  No rashes, lesions, or petechiae. ABDOMEN:  Soft, nontender, and nondistended.  Normal abdominal bowel sounds.  No rebound or guarding. EXTREMITIES:  Without clubbing, cyanosis, or edema. MUSCULOSKELETAL:  Without joint deformity or effusions.  No spinal or CVA tenderness. NEURO:  Cranial nerves II-XII grossly intact.  Strength 5/5 in all extremities and axial groups.  Normal sensation throughout.  Normal cerebellar function.  RADIOLOGY:  Chest x-ray showed no active disease in 1 view.  EKG:  Rhythm NSR, rate 68 bpm, nonspecific ST-T wave changes with question of significant Q-waves in V1 and V2 with normal axis, no evidence of hypertrophy (?).  PR 166, QRS 60, QTc of 395.  No prior tracing for comparison.LABORATORY DATA:  WBC is 11.8, HGB 14.4, HCT 43.4, and PLT count 272. Sodium 143, potassium 3.7, chloride 105, bicarb 27, BUN 5, creatinine 0.76, and glucose 97.  First full set of cardiac enzymes is negative.  ASSESSMENT/PLAN:  The patient was initially seen by Jarrett Ables, PAC, and then seen and thoroughly assessed by Dr. Marca Ancona.  Ms. Ziesmer is a 55 year old African American female with a known history of resistant hypertension who presents with chest discomfort x3 days as well as hypertensive urgency.  Her pain began suddenly 3 days ago and radiated to her back.  Pain is still present, but has significantly subsided.  Cardiac enzymes are negative x2 (here at  the hospital and in [place] on Saturday), EKG without acute changes. 1. Chest pain:  Atypical for ischemia - prolonged, no enzyme     elevation, no significant EKG changes.  Given sudden onset and     severity with associated elevation of blood pressure we will check     a CTA of the chest to rule out aortic dissection.  Also pleuritic     component, so we will assess same study for pulmonary embolism.  We     will continue enteric-coated aspirin, cycle cardiac enzymes.  Note,     negative Myoview in 2010. 2. Hypertension:  Long history of resistant hypertension.  The patient     reports excellent compliance with her medications (?).  We will     continue her home medications except we will discontinue amlodipine     in favor of Procardia XL 120 mg p.o. daily.  We will also initiate     nitroglycerin drip and titrate for systolic blood pressure goal of     160-170 mmHg (slowly convert hyperintensive urgency).  We will also     check 2-D echocardiogram.     Jarrett Ables, PAC   ______________________________ Marca Ancona, MD    MS/MEDQ  D:  10/22/2010  T:  10/23/2010  Job:  [id number]  Electronically Signed by Jarrett Ables PAC on 10/26/2010 01:35:56 PM Electronically Signed by Marca Ancona MD on 11/15/2010 08:34:43 AM

## 2010-11-21 ENCOUNTER — Encounter: Payer: Self-pay | Admitting: Cardiology

## 2010-12-20 LAB — BASIC METABOLIC PANEL
Calcium: 8.4 mg/dL (ref 8.4–10.5)
GFR calc Af Amer: >60 mL/min (ref >60)
GFR calc non Af Amer: >60 mL/min (ref >60)
Glucose, Bld: 125 mg/dL — ABNORMAL HIGH (ref 70–99)
Potassium: 3.2 mEq/L — ABNORMAL LOW (ref 3.5–5.1)
Sodium: 136 mEq/L (ref 135–145)

## 2010-12-20 LAB — CBC
HCT: 36.9 % (ref 36.0–46.0)
HCT: 43.3 % (ref 36.0–46.0)
Hemoglobin: 12.3 g/dL (ref 12.0–15.0)
Hemoglobin: 14.4 g/dL (ref 12.0–15.0)
MCH: 29.5 pg (ref 26.0–34.0)
MCHC: 33.3 g/dL (ref 30.0–36.0)
MCV: 88.5 fL (ref 78.0–100.0)
Platelets: 243 10*3/uL (ref 150–400)
RBC: 4.21 MIL/uL (ref 3.87–5.11)
RBC: 4.89 MIL/uL (ref 3.87–5.11)
RDW: 13.8 % (ref 11.5–15.5)
WBC: 12.4 10*3/uL — ABNORMAL HIGH (ref 4.0–10.5)
WBC: 8 10*3/uL (ref 4.0–10.5)

## 2010-12-20 LAB — URINALYSIS, ROUTINE W REFLEX MICROSCOPIC
Glucose, UA: NEGATIVE mg/dL
Hgb urine dipstick: NEGATIVE
Protein, ur: NEGATIVE mg/dL
pH: 6.5 (ref 5.0–8.0)

## 2010-12-20 LAB — COMPREHENSIVE METABOLIC PANEL
ALT: 15 U/L (ref 0–35)
AST: 21 U/L (ref 0–37)
Alkaline Phosphatase: 85 U/L (ref 39–117)
CO2: 32 mEq/L (ref 19–32)
GFR calc Af Amer: >60 mL/min (ref >60)
Glucose, Bld: 103 mg/dL — ABNORMAL HIGH (ref 70–99)
Potassium: 3.5 mEq/L (ref 3.5–5.1)
Sodium: 139 mEq/L (ref 135–145)
Total Protein: 6.5 g/dL (ref 6.0–8.3)

## 2010-12-20 LAB — GLUCOSE, CAPILLARY: Glucose-Capillary: 136 mg/dL — ABNORMAL HIGH (ref 70–99)

## 2010-12-20 LAB — SURGICAL PCR SCREEN
MRSA, PCR: NEGATIVE
Staphylococcus aureus: NEGATIVE

## 2010-12-20 LAB — PROTIME-INR: Prothrombin Time: 13.7 seconds (ref 11.6–15.2)

## 2011-05-21 ENCOUNTER — Encounter: Payer: Self-pay | Admitting: Cardiology

## 2011-06-25 ENCOUNTER — Telehealth: Payer: Self-pay | Admitting: Family Medicine

## 2011-06-26 NOTE — Telephone Encounter (Signed)
Closed

## 2011-06-27 ENCOUNTER — Encounter: Payer: Self-pay | Admitting: Family Medicine

## 2011-06-27 ENCOUNTER — Telehealth: Payer: Self-pay | Admitting: Family Medicine

## 2011-06-27 ENCOUNTER — Ambulatory Visit (INDEPENDENT_AMBULATORY_CARE_PROVIDER_SITE_OTHER): Payer: Federal, State, Local not specified - PPO | Admitting: Family Medicine

## 2011-06-27 VITALS — BP 176/109 | HR 91 | Temp 98.6°F | Wt 163.0 lb

## 2011-06-27 DIAGNOSIS — J40 Bronchitis, not specified as acute or chronic: Secondary | ICD-10-CM

## 2011-06-27 DIAGNOSIS — Z23 Encounter for immunization: Secondary | ICD-10-CM

## 2011-06-27 MED ORDER — FLUCONAZOLE 150 MG PO TABS: 150.0000 mg | ORAL_TABLET | Freq: Once | ORAL | Status: AC

## 2011-06-27 MED ORDER — BENZONATATE 200 MG PO CAPS: 200.0000 mg | ORAL_CAPSULE | Freq: Three times a day (TID) | ORAL | Status: AC | PRN

## 2011-06-27 MED ORDER — AZITHROMYCIN 500 MG PO TABS: ORAL_TABLET | ORAL | Status: DC

## 2011-06-27 NOTE — Patient Instructions (Signed)
Call if not better in about 5 days and will get a Chest Xray.

## 2011-06-27 NOTE — Progress Notes (Signed)
  Subjective:    Patient ID: [name], female    DOB: 07/22/1956, 55 y.o.   MRN: [id number]  HPI Cough is wet and productive for 3 weeks. No fever, ST or nasal congestion or headache.  Took IBU, robitussin and brandy and didn't really help.   Says her chest feels heavy and feels SOB at times. She says her sputum is mostly white or clear. No worsening or alleviating symptoms. No history of asthma or pulmonary disease.  She is still seeing cardiology for her BP.   Review of Systems     Objective:   Physical Exam  Constitutional: She is oriented to person, place, and time. She appears well-developed and well-nourished.  HENT:  Head: Normocephalic and atraumatic.  Right Ear: External ear normal.  Left Ear: External ear normal.  Nose: Nose normal.  Mouth/Throat: Oropharynx is clear and moist.       TMs and canals are clear.   Eyes: Conjunctivae and EOM are normal. Pupils are equal, round, and reactive to light.  Neck: Neck supple. No thyromegaly present.  Cardiovascular: Normal rate, regular rhythm and normal heart sounds.   Pulmonary/Chest: Effort normal. She has wheezes.       Slight Expiratory wheeze at the bases bilat.  Cleared after several coughs.   Lymphadenopathy:    She has no cervical adenopathy.  Neurological: She is alert and oriented to person, place, and time.  Skin: Skin is warm and dry.  Psychiatric: She has a normal mood and affect.          Assessment & Plan:  Bronchitis - With her SOB and chest heaviness possible PNA. Will tx with zpack, given cough tabs, Call if not iprovment over the weekend and will get a CXR. She also wanted rx for diflucan as usually gets yeast infection with ABX.

## 2011-06-27 NOTE — Telephone Encounter (Signed)
Called pharmacist and corrected dose to 250mg .

## 2011-06-27 NOTE — Telephone Encounter (Signed)
Pharmacist called from [place].  Questioning Zpak prescription that was written for 500mg  strength, but the directions were written as if pt was taking the 250 mg dosing.  If wants the 500mg  that was sent normally it is ordered 500mg  daily X 3 days.  Please advise. Plan:  Routed the encounter to Dr. Marlyne Beards, LPN [facility]

## 2011-07-01 ENCOUNTER — Telehealth: Payer: Self-pay | Admitting: Family Medicine

## 2011-07-01 DIAGNOSIS — R059 Cough, unspecified: Secondary | ICD-10-CM

## 2011-07-01 DIAGNOSIS — R05 Cough: Secondary | ICD-10-CM

## 2011-07-01 NOTE — Telephone Encounter (Signed)
Called and [facility] for the pt telling her to go for the CXR today downstairs in our building. Jarvis Newcomer, LPN [facility]

## 2011-07-01 NOTE — Telephone Encounter (Signed)
Order placed. She can go for it today.

## 2011-07-01 NOTE — Telephone Encounter (Signed)
Pt called, and was seen last Thursday with URI and has finished Ab tx yesterday, and still C/O chest hurting, lots of congestion(in chest), coughing a lot (sometimes productive, and sometimes not).  No fever. Plan: Reviewed last office note and said to do CXR if no better by Monday.  Will send pt for the chest xray, but will route this encounter to Dr. Linford Arnold to determine amt of views for CXR.     Jarvis Newcomer, LPN [facility]

## 2011-07-02 ENCOUNTER — Telehealth: Payer: Self-pay | Admitting: Family Medicine

## 2011-07-02 ENCOUNTER — Ambulatory Visit
Admission: RE | Admit: 2011-07-02 | Discharge: 2011-07-02 | Disposition: A | Discharge status: Home / Self Care | Payer: Medicare Other | Source: Ambulatory Visit | Attending: Family Medicine | Admitting: Family Medicine

## 2011-07-02 DIAGNOSIS — R05 Cough: Secondary | ICD-10-CM

## 2011-07-02 DIAGNOSIS — R059 Cough, unspecified: Secondary | ICD-10-CM

## 2011-07-02 NOTE — Telephone Encounter (Signed)
Closed

## 2011-07-03 ENCOUNTER — Telehealth: Payer: Self-pay | Admitting: *Deleted

## 2011-07-03 NOTE — Telephone Encounter (Signed)
Pt notified of results. Instructed pt that if no better by next week to call and make a followup appt. Pt verbalized understanding. KJ LPN

## 2011-07-03 NOTE — Telephone Encounter (Signed)
Message copied by Lanae Crumbly on Wed Jul 03, 2011  9:05 AM ------      Message from: Nani Gasser D      Created: Tue Jul 02, 2011 10:14 AM       CXR is normal. No pneumon or bronchitis. THat means the cough etc is viral. May take another week to get better symptomatic care.

## 2011-08-08 ENCOUNTER — Encounter: Payer: Self-pay | Admitting: Physician Assistant

## 2011-08-08 ENCOUNTER — Ambulatory Visit (INDEPENDENT_AMBULATORY_CARE_PROVIDER_SITE_OTHER): Payer: Federal, State, Local not specified - PPO | Admitting: Physician Assistant

## 2011-08-08 DIAGNOSIS — R0602 Shortness of breath: Secondary | ICD-10-CM

## 2011-08-08 DIAGNOSIS — Z01810 Encounter for preprocedural cardiovascular examination: Secondary | ICD-10-CM

## 2011-08-08 DIAGNOSIS — I10 Essential (primary) hypertension: Secondary | ICD-10-CM

## 2011-08-08 DIAGNOSIS — M5136 Other intervertebral disc degeneration, lumbar region: Secondary | ICD-10-CM

## 2011-08-08 DIAGNOSIS — M5137 Other intervertebral disc degeneration, lumbosacral region: Secondary | ICD-10-CM

## 2011-08-08 DIAGNOSIS — R55 Syncope and collapse: Secondary | ICD-10-CM | POA: Insufficient documentation

## 2011-08-08 MED ORDER — HYDRALAZINE HCL 50 MG PO TABS: ORAL_TABLET | ORAL | Status: DC

## 2011-08-08 NOTE — Assessment & Plan Note (Signed)
Pending surgery with Dr. Darrelyn Hillock.  She will undergo myoview testing.  If her BNP is ok and her Myoview is low risk,she will require no further workup prior to her surgery.  She should remain on her beta blocker throughout the perioperative period to reduce risk of cardiovascular complications.

## 2011-08-08 NOTE — Assessment & Plan Note (Signed)
She has some chronic dyspnea and has had some slight change in her symptoms.  This may have been related to her recent URI.  She does not particularly look volume overloaded to me.  Check a bmet and bnp.  Proceed with myoview.

## 2011-08-08 NOTE — Assessment & Plan Note (Signed)
Uncontrolled.  Increase Hydralazine to 75 mg TID.

## 2011-08-08 NOTE — Assessment & Plan Note (Addendum)
Etiology unclear.  She was evaluated at [facility] and there has been no apparent recurrence.  I will request records.  I will also arrange Lexiscan Myoview to rule out ischemic heart disease prior to clearing for surgery.  If echo was ok at [facility] and her Celine Ahr is ok, she will require no further cardiac workup prior to her surgery.     Addendum 08/15/11:  Stress Myoview low risk with breast attenuation and normal LV function.   According to [facility] and AHA guidelines, the patient requires no further cardiac workup prior to their noncardiac surgery.  The patient should be at acceptable risk.  Our service is available as necessary in the perioperative period.  08/15/2011 9:29 AM Tereso Newcomer, PA-C

## 2011-08-08 NOTE — Patient Instructions (Signed)
Lab today---BMP/BNP 401.1  780.2  786.05  V72.81  Increase hydralazine to 75mg  three times a day. This will be one and one-half 50mg  tablets three times a day    .  Your physician has requested that you have a lexiscan myoview. For further information please visit [email or web address] Please follow instruction sheet, as given.   Your physician recommends that you schedule a follow-up appointment in: 3 months with Dr Jens Som.

## 2011-08-08 NOTE — Progress Notes (Signed)
History of Present Illness: Primary Cardiologist: Dr. Olga Millers   [name] is a 55 y.o. female presents for surgical clearance.  She has a h/o hypertension, shortness of breath and chest pain. A nuclear study was performed on 05/29/09 and showed normal perfusion and normal LV function with an ejection fraction of 64%. An echocardiogram was performed on May 29, 2009 and showed normal LV function. Renal Dopplers were technically difficult. There was no stenosis on the left and the right was not well visualized.  Follow up CTA to rule out renal artery stenosis was done and it was negative.  Hydralazine was added to her medical regimen. A BNP was also ordered for dyspnea and was normal.  She was last seen by Dr. Jens Som in 10/2009.  She was admitted to [facility] in 10/2010 with chest pain and elevated BP.  MI was r/o and her BP meds were adjusted.  She has not followed up.  She needs L5-S1 microdiskectomy and laminectomy due to HNP with Dr. Darrelyn Hillock.  She has been limited significantly by her back pain.  She cannot achieve 4 METs.  She does note dyspnea from time to time.  She sleeps at an incline chronically without significant change.  No PND.  No edema.  No palpitations.  She does tell me she was admitted to [facility] in the last 6 months for syncope.  States she had an echo and was told she was ok.  She does not think she had a stress test.  She has recently been tx for bronchitis.    Past Medical History  Diagnosis Date  . HLD (hyperlipidemia)     nec/nos  . HTN (hypertension)     benign essential  . Narcolepsy   . Glucose intolerance (impaired glucose tolerance)   . Fatigue   . Chronic diastolic heart failure     hospitalized at [facility]  . Bipolar disorder   . Suicide attempt     hx  . DDD (degenerative disc disease), lumbar     Current Outpatient Prescriptions  Medication Sig Dispense Refill  . aspirin (ADULT ASPIRIN EC LOW STRENGTH) 81 MG EC tablet Take 81 mg by mouth daily.          . carvedilol (COREG) 25 MG tablet Take 25 mg by mouth 2 (two) times daily.        . clonazePAM (KLONOPIN) 1 MG tablet Take 1 mg by mouth 3 (three) times daily.        . cloNIDine (CATAPRES) 0.2 MG tablet Take 0.4 mg by mouth 3 (three) times daily.        . DULoxetine (CYMBALTA) 60 MG capsule Take 60 mg by mouth daily.        . enalapril (VASOTEC) 20 MG tablet Take 40 mg by mouth daily.        . furosemide (LASIX) 20 MG tablet Take 20 mg by mouth 2 (two) times daily.        [name] L-Methylfolate (DEPLIN) 7.5 MG TABS Take by mouth daily.        [name] spironolactone (ALDACTONE) 50 MG tablet Take 25 mg by mouth daily.       [name] zolpidem (AMBIEN CR) 12.5 MG CR tablet Take 12.5 mg by mouth at bedtime.        [name] DISCONTD: hydrALAZINE (APRESOLINE) 50 MG tablet Take 50 mg by mouth 3 (three) times daily.        . hydrALAZINE (APRESOLINE) 50 MG tablet Take 1 and 1/2  tablets three times a day  135 tablet  6  . lamoTRIgine (LAMICTAL) 100 MG tablet 100 mg by Per post-pyloric tube route Daily.        Allergies: Allergies  Allergen Reactions  . Codeine     REACTION: nausea    History  Substance Use Topics  . Smoking status: Former Games developer  . Smokeless tobacco: Not on file  . Alcohol Use: Yes     ROS:  Please see the history of present illness.  All other systems reviewed and negative.   Vital Signs: BP 140/100  Pulse 80  Ht 5\' 6"  (1.676 m)  Wt 168 lb (76.204 kg)  BMI 27.12 kg/m2  PHYSICAL EXAM: Well nourished, well developed, in no acute distress HEENT: normal Neck: no JVD Vascular: No carotid bruits Cardiac:  normal S1, S2; RRR; no murmur Lungs:  Faint crackles at the bases bilaterally that partially clears with cough, no wheezing, rhonchi Abd: soft, nontender, no hepatomegaly Ext: no edema Skin: warm and dry Neuro:  CNs 2-12 intact, no focal abnormalities noted Psych:  Normal affect  EKG:  NSR, HR 68, normal axis, NSSTTW changes, no significant changes  ASSESSMENT AND PLAN:

## 2011-08-09 ENCOUNTER — Telehealth: Payer: Self-pay | Admitting: Physician Assistant

## 2011-08-09 ENCOUNTER — Encounter: Payer: Self-pay | Admitting: Physician Assistant

## 2011-08-09 LAB — BASIC METABOLIC PANEL
BUN: 9 mg/dL (ref 6–23)
CO2: 28 mEq/L (ref 19–32)
Chloride: 104 mEq/L (ref 96–112)
Glucose, Bld: 109 mg/dL — ABNORMAL HIGH (ref 70–99)
Potassium: 3.7 mEq/L (ref 3.5–5.1)
Sodium: 139 mEq/L (ref 135–145)

## 2011-08-09 NOTE — Telephone Encounter (Signed)
ROI was faxed to [facility] 08/08/11 @ 4:15 spoke with Mr Staff there let them know we needed records ASAP for pt in Office seeing Tereso Newcomer, Dena and I Selena Batten M) called their MR Dept for 45 minutes on 2 separate phone lines trying to get someone to answer the phone to check the Status of these records, I finally spoke with Trula Ore in MR to let her know we needed these records STAT @ this Point, Records were finally faxed to [facility] @ 5:07 pm, they have been given to Carol/Scott 08/09/11/km

## 2011-08-13 ENCOUNTER — Telehealth: Payer: Self-pay | Admitting: *Deleted

## 2011-08-13 NOTE — Telephone Encounter (Signed)
lmom will continue with lexiscan here in our office, since no stress test @ [facility]. Danielle Rankin

## 2011-08-14 ENCOUNTER — Ambulatory Visit (HOSPITAL_COMMUNITY): Payer: Federal, State, Local not specified - PPO | Attending: Cardiology | Admitting: Radiology

## 2011-08-14 DIAGNOSIS — Z8249 Family history of ischemic heart disease and other diseases of the circulatory system: Secondary | ICD-10-CM | POA: Insufficient documentation

## 2011-08-14 DIAGNOSIS — R55 Syncope and collapse: Secondary | ICD-10-CM

## 2011-08-14 DIAGNOSIS — R9431 Abnormal electrocardiogram [ECG] [EKG]: Secondary | ICD-10-CM

## 2011-08-14 DIAGNOSIS — E785 Hyperlipidemia, unspecified: Secondary | ICD-10-CM | POA: Insufficient documentation

## 2011-08-14 DIAGNOSIS — R0989 Other specified symptoms and signs involving the circulatory and respiratory systems: Secondary | ICD-10-CM | POA: Insufficient documentation

## 2011-08-14 DIAGNOSIS — R000 Tachycardia, unspecified: Secondary | ICD-10-CM | POA: Insufficient documentation

## 2011-08-14 DIAGNOSIS — E119 Type 2 diabetes mellitus without complications: Secondary | ICD-10-CM | POA: Insufficient documentation

## 2011-08-14 DIAGNOSIS — R0602 Shortness of breath: Secondary | ICD-10-CM

## 2011-08-14 DIAGNOSIS — R5381 Other malaise: Secondary | ICD-10-CM | POA: Insufficient documentation

## 2011-08-14 DIAGNOSIS — R5383 Other fatigue: Secondary | ICD-10-CM | POA: Insufficient documentation

## 2011-08-14 DIAGNOSIS — Z01810 Encounter for preprocedural cardiovascular examination: Secondary | ICD-10-CM

## 2011-08-14 DIAGNOSIS — R110 Nausea: Secondary | ICD-10-CM | POA: Insufficient documentation

## 2011-08-14 DIAGNOSIS — Z87891 Personal history of nicotine dependence: Secondary | ICD-10-CM | POA: Insufficient documentation

## 2011-08-14 DIAGNOSIS — R079 Chest pain, unspecified: Secondary | ICD-10-CM

## 2011-08-14 DIAGNOSIS — R42 Dizziness and giddiness: Secondary | ICD-10-CM | POA: Insufficient documentation

## 2011-08-14 DIAGNOSIS — I10 Essential (primary) hypertension: Secondary | ICD-10-CM | POA: Insufficient documentation

## 2011-08-14 DIAGNOSIS — R0609 Other forms of dyspnea: Secondary | ICD-10-CM | POA: Insufficient documentation

## 2011-08-14 DIAGNOSIS — R002 Palpitations: Secondary | ICD-10-CM | POA: Insufficient documentation

## 2011-08-14 MED ORDER — TECHNETIUM TC 99M TETROFOSMIN IV KIT
33.0000 | PACK | Freq: Once | INTRAVENOUS | Status: AC | PRN
  Administered 2011-08-14: 33 via INTRAVENOUS

## 2011-08-14 MED ORDER — REGADENOSON 0.4 MG/5ML IV SOLN
0.4000 mg | Freq: Once | INTRAVENOUS | Status: AC
  Administered 2011-08-14: 0.4 mg via INTRAVENOUS

## 2011-08-14 MED ORDER — TECHNETIUM TC 99M TETROFOSMIN IV KIT
10.9000 | PACK | Freq: Once | INTRAVENOUS | Status: AC | PRN
  Administered 2011-08-14: 10.9 via INTRAVENOUS

## 2011-08-14 NOTE — Progress Notes (Signed)
[facility] SITE 3 NUCLEAR MED [address] [place]  [address] [phone]  Cardiology Nuclear Med Study  [name] is a 55 y.o. female [id number] 01/21/1956   Nuclear Med Background Indication for Stress Test:  Evaluation for Ischemia and Pending Surgical Clearance for Spinal Surgery by Dr. Gioffre History:  '10 Echo:EF=65%; '10 [facility]:Normal, EF=64%; H/O CHF; ~6 months ago Echo @ [facility]:OK per patient Cardiac Risk Factors: Family History - CAD, History of Smoking, Hypertension, Lipids and Diet Controlled NIDDM  Symptoms:  Chest Pain (last episode of chest discomfort was about six months ago when admitted to [facility] with CP and syncope), Dizziness, DOE, Fatigue, Nausea, Palpitations, Rapid HR and Syncope   Nuclear Pre-Procedure Caffeine/Decaff Intake:  None NPO After: 7:00am   Lungs:  Clear.  O2 SAT 98% on RA. IV 0.9% NS with Angio Cath:  22g  IV Site: R Hand  IV Started by:  Jackie Smith, RN  Chest Size (in):  40 Cup Size: D  Height: 5' 6" (1.676 m)  Weight:  168 lb (76.204 kg)  BMI:  Body mass index is 27.12 kg/(m^2). Tech Comments:  Patient held Lasix and Carvedilol this AM    Nuclear Med Study 1 or 2 day study: 1 day  Stress Test Type:  Lexiscan  Reading MD: Andraya Frigon, MD  Order Authorizing Provider:  Brian Crenshaw; Scott Weaver, PA-C; Ronald Gioffre, MD  Resting Radionuclide: Technetium 99m Tetrofosmin  Resting Radionuclide Dose: 10.9 mCi   Stress Radionuclide:  Technetium 99m Tetrofosmin  Stress Radionuclide Dose: 33 mCi           Stress Protocol Rest HR: 78 Stress HR: 100  Rest BP: 151/99 Stress BP: 153/102 and 171/91  Exercise Time (min): n/a METS: n/a   Predicted Max HR: 166 bpm % Max HR: 60.24 bpm Rate Pressure Product: 17100   Dose of Adenosine (mg):  n/a Dose of Lexiscan: 0.4 mg  Dose of Atropine (mg): n/a Dose of Dobutamine: n/a mcg/kg/min (at max HR)  Stress Test Technologist: Sherri Ballard, CMA-N  Nuclear  Technologist:  Stephen Carbone, CNMT     Rest Procedure:  Myocardial perfusion imaging was performed at rest 45 minutes following the intravenous administration of Technetium 99m Tetrofosmin.  Rest ECG: Diffuse T-wave changes.  Stress Procedure:  The patient received IV Lexiscan 0.4 mg over 15-seconds.  Technetium 99m Tetrofosmin injected at 30-seconds.  There were no significant changes with Lexiscan.  Quantitative spect images were obtained after a 45 minute delay.  Stress ECG: No significant change from baseline ECG  QPS Raw Data Images:  There is a breast shadow that accounts for the anterior attenuation. Stress Images:  Decreased upstake in anterior wall Rest Images:  Decreased uptake in anterior wall Subtraction (SDS):  SDS 2 abnormal at apex Transient Ischemic Dilatation (Normal <1.22):  1.0 Lung/Heart Ratio (Normal <0.45):  .21  Quantitative Gated Spect Images QGS EDV:  71 ml QGS ESV:  20 ml QGS cine images:  NL LV Function; NL Wall Motion QGS EF: 71%  Impression Exercise Capacity:  Lexiscan with no exercise. BP Response:  Normal blood pressure response. Clinical Symptoms:  No chest pain. ECG Impression:  Baseline ECG LVH with strain Comparison with Prior Nuclear Study: No images to compare  Overall Impression:   Breast attenuation.  Low risk study  EF 71%    Rhetta Cleek  

## 2011-08-30 ENCOUNTER — Encounter (HOSPITAL_COMMUNITY): Payer: Self-pay

## 2011-09-03 NOTE — H&P (Signed)
Demeka A. Rowlette DOB: Apr 23, 1956 Married / Language: English / Race: Black or African American Female   History of Present Illness The patient is a 55 year old female who comes in today for a preoperative History and Physical. The patient is scheduled for a hemilaminectomy L-5/S-1 to the right to be performed by Dr. Georges Lynch. Darrelyn Hillock, MD at [facility] on September 11, 2011 .  Problem List/Past Medical Lumbar disc herniation (722.10)  Allergies CODEINE.   Family History Diabetes Mellitus. mother and grandmother mothers side Congestive Heart Failure. grandmother mothers side Heart disease in female family member before age 4 Heart Disease. father and grandfather fathers side Cerebrovascular Accident. father, grandmother mothers side and grandmother fathers side Hypertension. mother and father Heart disease in female family member before age 26 Osteoarthritis. mother   Social History Drug/Alcohol Rehab (Currently). no Current work status. disabled Exercise. Exercises rarely Drug/Alcohol Rehab (Previously). no Children. 2 Alcohol use. never consumed alcohol Illicit drug use. no Tobacco use. former smoker; smoke(d) less than 1/2 pack(s) per day Tobacco / smoke exposure. yes Marital status. married Living situation. live with spouse Pain Contract. no Number of flights of stairs before winded. less than 1   Medication History Percocet (10-650MG  Tablet, TAKE 1 TABLET EVERY 4 HOURS AS NEEDED FOR PAIN., Taken starting 08/22/2009) Active. Amlodipine-Atorvastatin (10-40MG  Tablet, Oral) Active. Aspirin (81MG  Tablet, Oral) Active. Coreg (25MG  Tablet, Oral) Active. CloNIDine HCl (0.2MG  Tablet, Oral) Active. Colace (100MG  Capsule, Oral) Active. B-12 ( Tablet, Oral) Active. Enalapril Maleate (20MG  Tablet, Oral) Active. Lasix (20MG  Tablet, Oral) Active. HydrALAZINE HCl (25MG  Tablet, Oral) Active. PriLOSEC (20MG  Capsule DR, Oral)  Active. Pravastatin Sodium (20MG  Tablet, Oral) Active. Pravastatin Sodium (40MG  Tablet, Oral) Active. Spironolactone (50MG  Tablet, Oral) Active. Ambien CR (12.5MG  Tablet ER, Oral) Active.  Pregnancy / Birth History Pregnant. no  Past Surgical History Foot Surgery. left Colectomy. partial Hysterectomy. partial (cancerous)  Other Problems Psychiatric disorder Osteoarthritis High blood pressure Depression Congestive Heart Failure Gastroesophageal Reflux Disease Diabetes Mellitus, Type II Anxiety Disorder  Review of Systems General:Not Present- Chills, Fever, Night Sweats, Fatigue, Weight Gain, Weight Loss and Memory Loss. Skin:Not Present- Hives, Itching, Rash, Eczema and Lesions. HEENT:Not Present- Tinnitus, Headache, Double Vision, Visual Loss, Hearing Loss and Dentures. Respiratory:Present- Shortness of breath with exertion. Not Present- Shortness of breath at rest, Allergies, Coughing up blood and Chronic Cough. Cardiovascular:Not Present- Chest Pain, Racing/skipping heartbeats, Difficulty Breathing Lying Down, Murmur, Swelling and Palpitations. Gastrointestinal:Present- Heartburn. Not Present- Bloody Stool, Abdominal Pain, Vomiting, Nausea, Constipation, Diarrhea, Difficulty Swallowing, Jaundice and Loss of appetitie. Female Genitourinary:Not Present- Blood in Urine, Urinary frequency, Weak urinary stream, Discharge, Flank Pain, Incontinence, Painful Urination, Urgency, Urinary Retention and Urinating at Night. Musculoskeletal:Present- Back Pain and Spasms. Not Present- Muscle Weakness, Muscle Pain, Joint Swelling, Joint Pain and Morning Stiffness. Neurological:Not Present- Tremor, Dizziness, Blackout spells, Paralysis, Difficulty with balance and Weakness. Psychiatric:Not Present- Insomnia.  Vitals 09/02/2011 4:34 PM Weight: 165 lb Height: 66 in Body Surface Area: 1.87 m Body Mass Index: 26.63 kg/m Pulse: 72 (Regular) Resp.: 17 (Unlabored) BP:  156/90 (Sitting, Left Arm, Standard)   Physical Exam The physical exam findings are as follows: General: Mental Status - Alert, cooperative and good historian. General Appearance- pleasant. Not in acute distress. Orientation- Oriented X3. Build & Nutrition- Well nourished and Well developed. Head- normocephalic, atraumatic . Neck- supple. no bruit auscultated on the right and no bruit auscultated on the left. Eye: Pupil- Bilateral- PERRLA. Motion- Bilateral- EOMI. Chest and Lung Exam:Breath sounds:- clear at anterior  chest wall and - clear at posterior chest wall. Adventitious sounds:- No Adventitious sounds. Cardiovascular:Rhythm- Regular rate and rhythm. Heart Sounds- S1 WNL and S2 WNL. No Murmurs. Abdomen: Palpation/Percussion:Tenderness- Abdomen is non-tender to palpation. Rigidity (guarding)- Abdomen is soft. Bowel sounds normal. Musculoskeletal: On physical exam of her back she can barely go through range of motion because of her back pain. Her straight leg raise is positive on the right. Difficult to detect any definite motor weakness. I examined her today because of the pain she is having. She appears to be having some pain and weakness with plantar flexion and dorsiflexion of the right foot. Her sensory exam, marked decreased sensation in the posterior right calf compared to the left. Her calves are soft and nontender. No phlebitis. Hips and knees are negative.  RADIOGRAPHS: X rays of her lower back show degenerative disc disease at L5-S1. Of note, we have a previous MRI that shows a herniated disc at L5-S1 on the right.  Assessment & Plan Lumbar disc herniation (722.10)  We will proceed with a hemilaminectomy and microdiscectomy at L5-S1 on the right. The patient has been cleared for surgery by Tereso Newcomer, PA-C, from Dr. Lewayne Bunting cardiology office.  Daveion Robar A 09/03/2011 08:06

## 2011-09-06 ENCOUNTER — Encounter (HOSPITAL_COMMUNITY): Payer: Self-pay

## 2011-09-06 ENCOUNTER — Ambulatory Visit (HOSPITAL_COMMUNITY)
Admission: RE | Admit: 2011-09-06 | Discharge: 2011-09-06 | Disposition: A | Discharge status: Home / Self Care | Payer: Federal, State, Local not specified - PPO | Source: Ambulatory Visit | Attending: Orthopedic Surgery | Admitting: Orthopedic Surgery

## 2011-09-06 ENCOUNTER — Encounter (HOSPITAL_COMMUNITY)
Admission: RE | Admit: 2011-09-06 | Discharge: 2011-09-06 | Disposition: A | Discharge status: Home / Self Care | Payer: Federal, State, Local not specified - PPO | Source: Ambulatory Visit | Attending: Orthopedic Surgery | Admitting: Orthopedic Surgery

## 2011-09-06 DIAGNOSIS — M5126 Other intervertebral disc displacement, lumbar region: Secondary | ICD-10-CM | POA: Insufficient documentation

## 2011-09-06 DIAGNOSIS — J984 Other disorders of lung: Secondary | ICD-10-CM | POA: Insufficient documentation

## 2011-09-06 DIAGNOSIS — Z01811 Encounter for preprocedural respiratory examination: Secondary | ICD-10-CM | POA: Insufficient documentation

## 2011-09-06 DIAGNOSIS — Z01812 Encounter for preprocedural laboratory examination: Secondary | ICD-10-CM | POA: Insufficient documentation

## 2011-09-06 HISTORY — DX: Reserved for concepts with insufficient information to code with codable children: IMO0002

## 2011-09-06 HISTORY — DX: Gastro-esophageal reflux disease without esophagitis: K21.9

## 2011-09-06 HISTORY — DX: Unspecified diabetes mellitus in pregnancy, unspecified trimester: O24.919

## 2011-09-06 HISTORY — DX: Nonrheumatic mitral (valve) prolapse: I34.1

## 2011-09-06 HISTORY — DX: Adverse effect of unspecified anesthetic, initial encounter: T41.45XA

## 2011-09-06 HISTORY — DX: Other complications of anesthesia, initial encounter: T88.59XA

## 2011-09-06 LAB — URINALYSIS, ROUTINE W REFLEX MICROSCOPIC
Bilirubin Urine: NEGATIVE
Glucose, UA: NEGATIVE mg/dL
Hgb urine dipstick: NEGATIVE
Specific Gravity, Urine: 1.019 (ref 1.005–1.030)
pH: 6 (ref 5.0–8.0)

## 2011-09-06 LAB — DIFFERENTIAL
Basophils Absolute: 0.1 10*3/uL (ref 0.0–0.1)
Basophils Relative: 1 % (ref 0–1)
Eosinophils Absolute: 0.5 10*3/uL (ref 0.0–0.7)
Eosinophils Relative: 6 % — ABNORMAL HIGH (ref 0–5)
Lymphocytes Relative: 35 % (ref 12–46)

## 2011-09-06 LAB — COMPREHENSIVE METABOLIC PANEL
ALT: 10 U/L (ref 0–35)
CO2: 29 mEq/L (ref 19–32)
Calcium: 9.4 mg/dL (ref 8.4–10.5)
GFR calc Af Amer: >90 mL/min (ref >90)
GFR calc non Af Amer: >90 mL/min (ref >90)
Glucose, Bld: 95 mg/dL (ref 70–99)
Sodium: 139 mEq/L (ref 135–145)
Total Bilirubin: 0.4 mg/dL (ref 0.3–1.2)

## 2011-09-06 LAB — CBC
Hemoglobin: 14 g/dL (ref 12.0–15.0)
MCH: 28.6 pg (ref 26.0–34.0)
MCV: 89 fL (ref 78.0–100.0)
RBC: 4.9 MIL/uL (ref 3.87–5.11)

## 2011-09-06 LAB — ABO/RH: ABO/RH(D): B NEG

## 2011-09-06 LAB — SURGICAL PCR SCREEN
MRSA, PCR: NEGATIVE
Staphylococcus aureus: NEGATIVE

## 2011-09-06 LAB — PROTIME-INR: Prothrombin Time: 14 seconds (ref 11.6–15.2)

## 2011-09-06 NOTE — Patient Instructions (Addendum)
20 VALKYRIE GUARDIOLA  09/06/2011   Your procedure is scheduled on:  09/11/11  Report to [facility] at 6:30 AM.  Call this number if you have problems the morning of surgery: [phone]   Remember:   Do not eat food:After Midnight.  May have clear liquids:until Midnight .  Clear liquids include soda, tea, black coffee, apple or grape juice, broth.  Take these medicines the morning of surgery with A SIP OF WATER: carvedilol / klonopin / catapres / nexium / apresoline / dilaudid if needed or oxycodone   Do not wear jewelry, make-up or nail polish.  Do not wear lotions, powders, or perfumes. You may wear deodorant.  Do not shave 48 hours prior to surgery.  Do not bring valuables to the hospital.  Contacts, dentures or bridgework may not be worn into surgery.  Leave suitcase in the car. After surgery it may be brought to your room.  For patients admitted to the hospital, checkout time is 11:00 AM the day of discharge.   Patients discharged the day of surgery will not be allowed to drive home.  Name and phone number of your driver:   Special Instructions: CHG Shower Use Special Wash: 1/2 bottle night before surgery and 1/2 bottle morning of surgery.   Please read over the following fact sheets that you were given: MRSA Information

## 2011-09-10 NOTE — Anesthesia Preprocedure Evaluation (Addendum)
Anesthesia Evaluation  Patient identified by MRN, date of birth, ID band Patient awake    Reviewed: Allergy & Precautions, H&P , NPO status , Patient's Chart, lab work & pertinent test results  Airway Mallampati: II TM Distance: >3 FB Neck ROM: full    Dental No notable dental hx. (+) Teeth Intact and Dental Advisory Given   Pulmonary neg pulmonary ROS, shortness of breath and with exertion,  Difficulty breathing when wakes up from anesthesia sometimes clear to auscultation  Pulmonary exam normal       Cardiovascular Exercise Tolerance: Good hypertension, On Medications +CHF and neg cardio ROS regular Normal Had wet lungs after a surgery one time.   Neuro/Psych PSYCHIATRIC DISORDERS Bipolar Disorder Negative Neurological ROS  Negative Psych ROS   GI/Hepatic negative GI ROS, Neg liver ROS, GERD-  Controlled and Medicated,  Endo/Other  Negative Endocrine ROSDiabetes mellitus-, Type 2  Renal/GU negative Renal ROS  Genitourinary negative   Musculoskeletal   Abdominal   Peds  Hematology negative hematology ROS (+)   Anesthesia Other Findings   Reproductive/Obstetrics negative OB ROS                          Anesthesia Physical Anesthesia Plan  ASA: III  Anesthesia Plan: General   Post-op Pain Management:    Induction: Intravenous  Airway Management Planned: Oral ETT  Additional Equipment:   Intra-op Plan:   Post-operative Plan: Extubation in OR  Informed Consent: I have reviewed the patients History and Physical, chart, labs and discussed the procedure including the risks, benefits and alternatives for the proposed anesthesia with the patient or authorized representative who has indicated his/her understanding and acceptance.   Dental Advisory Given  Plan Discussed with: CRNA and Surgeon  Anesthesia Plan Comments:         Anesthesia Quick Evaluation

## 2011-09-11 ENCOUNTER — Other Ambulatory Visit: Payer: Self-pay | Admitting: Orthopedic Surgery

## 2011-09-11 ENCOUNTER — Encounter (HOSPITAL_COMMUNITY): Payer: Self-pay | Admitting: Anesthesiology

## 2011-09-11 ENCOUNTER — Other Ambulatory Visit (HOSPITAL_COMMUNITY): Payer: Federal, State, Local not specified - PPO

## 2011-09-11 ENCOUNTER — Encounter (HOSPITAL_COMMUNITY)
Admission: RE | Disposition: A | Discharge status: Home / Self Care | Payer: Self-pay | Source: Ambulatory Visit | Attending: Orthopedic Surgery

## 2011-09-11 ENCOUNTER — Inpatient Hospital Stay (HOSPITAL_COMMUNITY): Payer: Federal, State, Local not specified - PPO

## 2011-09-11 ENCOUNTER — Inpatient Hospital Stay (HOSPITAL_COMMUNITY): Payer: Federal, State, Local not specified - PPO | Admitting: Anesthesiology

## 2011-09-11 ENCOUNTER — Ambulatory Visit (HOSPITAL_COMMUNITY)
Admission: RE | Admit: 2011-09-11 | Discharge: 2011-09-13 | Disposition: A | Discharge status: Home / Self Care | Payer: Federal, State, Local not specified - PPO | Source: Ambulatory Visit | Attending: Orthopedic Surgery | Admitting: Orthopedic Surgery

## 2011-09-11 ENCOUNTER — Encounter (HOSPITAL_COMMUNITY): Payer: Self-pay | Admitting: *Deleted

## 2011-09-11 DIAGNOSIS — F3289 Other specified depressive episodes: Secondary | ICD-10-CM | POA: Insufficient documentation

## 2011-09-11 DIAGNOSIS — E119 Type 2 diabetes mellitus without complications: Secondary | ICD-10-CM | POA: Insufficient documentation

## 2011-09-11 DIAGNOSIS — I10 Essential (primary) hypertension: Secondary | ICD-10-CM | POA: Insufficient documentation

## 2011-09-11 DIAGNOSIS — I509 Heart failure, unspecified: Secondary | ICD-10-CM | POA: Insufficient documentation

## 2011-09-11 DIAGNOSIS — F411 Generalized anxiety disorder: Secondary | ICD-10-CM | POA: Insufficient documentation

## 2011-09-11 DIAGNOSIS — M5117 Intervertebral disc disorders with radiculopathy, lumbosacral region: Secondary | ICD-10-CM | POA: Diagnosis present

## 2011-09-11 DIAGNOSIS — Z79899 Other long term (current) drug therapy: Secondary | ICD-10-CM | POA: Insufficient documentation

## 2011-09-11 DIAGNOSIS — K219 Gastro-esophageal reflux disease without esophagitis: Secondary | ICD-10-CM | POA: Insufficient documentation

## 2011-09-11 DIAGNOSIS — M1990 Unspecified osteoarthritis, unspecified site: Secondary | ICD-10-CM | POA: Insufficient documentation

## 2011-09-11 DIAGNOSIS — F329 Major depressive disorder, single episode, unspecified: Secondary | ICD-10-CM | POA: Insufficient documentation

## 2011-09-11 DIAGNOSIS — M5126 Other intervertebral disc displacement, lumbar region: Secondary | ICD-10-CM | POA: Insufficient documentation

## 2011-09-11 HISTORY — PX: LUMBAR LAMINECTOMY/DECOMPRESSION MICRODISCECTOMY: SHX5026

## 2011-09-11 LAB — TYPE AND SCREEN: Antibody Screen: NEGATIVE

## 2011-09-11 SURGERY — LUMBAR LAMINECTOMY/DECOMPRESSION MICRODISCECTOMY
Anesthesia: General | Site: Back | Laterality: Right | Wound class: Clean

## 2011-09-11 MED ORDER — ROCURONIUM BROMIDE 100 MG/10ML IV SOLN
INTRAVENOUS | Status: DC | PRN
  Administered 2011-09-11: 10 mg via INTRAVENOUS
  Administered 2011-09-11: 40 mg via INTRAVENOUS

## 2011-09-11 MED ORDER — DULOXETINE HCL 60 MG PO CPEP
60.0000 mg | ORAL_CAPSULE | Freq: Every evening | ORAL | Status: DC
  Administered 2011-09-11 – 2011-09-12 (×2): 60 mg via ORAL
  Filled 2011-09-11 (×3): qty 1

## 2011-09-11 MED ORDER — EPHEDRINE SULFATE 50 MG/ML IJ SOLN
INTRAMUSCULAR | Status: DC | PRN
  Administered 2011-09-11: 5 mg via INTRAVENOUS
  Administered 2011-09-11: 10 mg via INTRAVENOUS

## 2011-09-11 MED ORDER — PROMETHAZINE HCL 25 MG/ML IJ SOLN
6.2500 mg | INTRAMUSCULAR | Status: DC | PRN
  Administered 2011-09-11: 6.25 mg via INTRAVENOUS

## 2011-09-11 MED ORDER — MENTHOL 3 MG MT LOZG: 1.0000 | LOZENGE | OROMUCOSAL | Status: DC | PRN

## 2011-09-11 MED ORDER — METHOCARBAMOL 500 MG PO TABS
500.0000 mg | ORAL_TABLET | Freq: Four times a day (QID) | ORAL | Status: DC | PRN
  Administered 2011-09-11 – 2011-09-12 (×4): 500 mg via ORAL
  Filled 2011-09-11 (×7): qty 1

## 2011-09-11 MED ORDER — HYDRALAZINE HCL 50 MG PO TABS
75.0000 mg | ORAL_TABLET | Freq: Three times a day (TID) | ORAL | Status: DC
  Administered 2011-09-11 – 2011-09-13 (×6): 75 mg via ORAL
  Filled 2011-09-11 (×8): qty 1

## 2011-09-11 MED ORDER — GLYCOPYRROLATE 0.2 MG/ML IJ SOLN
INTRAMUSCULAR | Status: DC | PRN
  Administered 2011-09-11: .6 mg via INTRAVENOUS

## 2011-09-11 MED ORDER — ACETAMINOPHEN 650 MG RE SUPP: 650.0000 mg | RECTAL | Status: DC | PRN

## 2011-09-11 MED ORDER — POVIDONE-IODINE 7.5 % EX SOLN
Freq: Once | CUTANEOUS | Status: DC
  Filled 2011-09-11: qty 118

## 2011-09-11 MED ORDER — LACTATED RINGERS IV SOLN
INTRAVENOUS | Status: DC
  Administered 2011-09-11 (×2): via INTRAVENOUS

## 2011-09-11 MED ORDER — DEPLIN 7.5 MG PO TABS
1.0000 | ORAL_TABLET | Freq: Every evening | ORAL | Status: DC
  Administered 2011-09-11 – 2011-09-12 (×2): 7.5 mg via ORAL
  Filled 2011-09-11 (×3): qty 1

## 2011-09-11 MED ORDER — HYDROMORPHONE HCL PF 1 MG/ML IJ SOLN
0.2500 mg | INTRAMUSCULAR | Status: DC | PRN
  Administered 2011-09-11 (×4): 0.5 mg via INTRAVENOUS

## 2011-09-11 MED ORDER — SPIRONOLACTONE 25 MG PO TABS
25.0000 mg | ORAL_TABLET | Freq: Every day | ORAL | Status: DC
Start: 2011-09-12 — End: 2011-09-13
  Administered 2011-09-12 – 2011-09-13 (×2): 25 mg via ORAL
  Filled 2011-09-11 (×2): qty 1

## 2011-09-11 MED ORDER — HYDROMORPHONE HCL PF 1 MG/ML IJ SOLN
INTRAMUSCULAR | Status: AC
  Filled 2011-09-11: qty 1

## 2011-09-11 MED ORDER — ONDANSETRON HCL 4 MG/2ML IJ SOLN
INTRAMUSCULAR | Status: DC | PRN
  Administered 2011-09-11: 4 mg via INTRAVENOUS

## 2011-09-11 MED ORDER — PROMETHAZINE HCL 25 MG/ML IJ SOLN
INTRAMUSCULAR | Status: AC
  Filled 2011-09-11: qty 1

## 2011-09-11 MED ORDER — SODIUM CHLORIDE 0.9 % IR SOLN
Status: DC | PRN
  Administered 2011-09-11: 08:00:00

## 2011-09-11 MED ORDER — FUROSEMIDE 20 MG PO TABS
20.0000 mg | ORAL_TABLET | Freq: Every day | ORAL | Status: DC
  Administered 2011-09-11 – 2011-09-13 (×3): 20 mg via ORAL
  Filled 2011-09-11 (×3): qty 1

## 2011-09-11 MED ORDER — CARVEDILOL 25 MG PO TABS
25.0000 mg | ORAL_TABLET | Freq: Two times a day (BID) | ORAL | Status: DC
  Administered 2011-09-11 – 2011-09-13 (×5): 25 mg via ORAL
  Filled 2011-09-11 (×6): qty 1

## 2011-09-11 MED ORDER — PANTOPRAZOLE SODIUM 40 MG PO TBEC
40.0000 mg | DELAYED_RELEASE_TABLET | Freq: Every day | ORAL | Status: DC
  Administered 2011-09-11 – 2011-09-13 (×3): 40 mg via ORAL
  Filled 2011-09-11 (×3): qty 1

## 2011-09-11 MED ORDER — ENALAPRIL MALEATE 20 MG PO TABS
20.0000 mg | ORAL_TABLET | Freq: Two times a day (BID) | ORAL | Status: DC
  Administered 2011-09-11 – 2011-09-13 (×5): 20 mg via ORAL
  Filled 2011-09-11 (×6): qty 1

## 2011-09-11 MED ORDER — THROMBIN 5000 UNITS EX KIT
PACK | CUTANEOUS | Status: DC | PRN
  Administered 2011-09-11: 10000 [IU] via TOPICAL

## 2011-09-11 MED ORDER — PHENOL 1.4 % MT LIQD: 1.0000 | OROMUCOSAL | Status: DC | PRN

## 2011-09-11 MED ORDER — CEFAZOLIN SODIUM 1-5 GM-% IV SOLN
1.0000 g | Freq: Three times a day (TID) | INTRAVENOUS | Status: AC
  Administered 2011-09-11 – 2011-09-12 (×3): 1 g via INTRAVENOUS
  Filled 2011-09-11 (×3): qty 50

## 2011-09-11 MED ORDER — MIDAZOLAM HCL 5 MG/5ML IJ SOLN
INTRAMUSCULAR | Status: DC | PRN
  Administered 2011-09-11: 2 mg via INTRAVENOUS

## 2011-09-11 MED ORDER — ONDANSETRON HCL 4 MG/2ML IJ SOLN: 4.0000 mg | INTRAMUSCULAR | Status: DC | PRN

## 2011-09-11 MED ORDER — NEOSTIGMINE METHYLSULFATE 1 MG/ML IJ SOLN
INTRAMUSCULAR | Status: DC | PRN
  Administered 2011-09-11: 4 mg via INTRAVENOUS

## 2011-09-11 MED ORDER — ACETAMINOPHEN 325 MG PO TABS: 650.0000 mg | ORAL_TABLET | ORAL | Status: DC | PRN

## 2011-09-11 MED ORDER — PROPOFOL 10 MG/ML IV BOLUS
INTRAVENOUS | Status: DC | PRN
  Administered 2011-09-11: 150 mg via INTRAVENOUS

## 2011-09-11 MED ORDER — HYDROMORPHONE HCL PF 1 MG/ML IJ SOLN
0.5000 mg | INTRAMUSCULAR | Status: DC | PRN
  Administered 2011-09-11 – 2011-09-13 (×8): 1 mg via INTRAVENOUS
  Filled 2011-09-11 (×9): qty 1

## 2011-09-11 MED ORDER — METHOCARBAMOL 100 MG/ML IJ SOLN
500.0000 mg | Freq: Four times a day (QID) | INTRAVENOUS | Status: DC | PRN
  Filled 2011-09-11: qty 5

## 2011-09-11 MED ORDER — FENTANYL CITRATE 0.05 MG/ML IJ SOLN
INTRAMUSCULAR | Status: DC | PRN
  Administered 2011-09-11: 50 ug via INTRAVENOUS
  Administered 2011-09-11 (×2): 100 ug via INTRAVENOUS

## 2011-09-11 MED ORDER — CLONAZEPAM 1 MG PO TABS
1.0000 mg | ORAL_TABLET | Freq: Three times a day (TID) | ORAL | Status: DC
  Administered 2011-09-11 – 2011-09-13 (×6): 1 mg via ORAL
  Filled 2011-09-11 (×6): qty 1

## 2011-09-11 MED ORDER — LACTATED RINGERS IV SOLN
INTRAVENOUS | Status: DC
  Administered 2011-09-11: 125 mL/h via INTRAVENOUS

## 2011-09-11 MED ORDER — BUPIVACAINE LIPOSOME 1.3 % IJ SUSP
20.0000 mL | INTRAMUSCULAR | Status: DC
  Filled 2011-09-11: qty 20

## 2011-09-11 MED ORDER — CEFAZOLIN SODIUM 1-5 GM-% IV SOLN
1.0000 g | INTRAVENOUS | Status: AC
  Administered 2011-09-11: 1 g via INTRAVENOUS

## 2011-09-11 MED ORDER — BACITRACIN-NEOMYCIN-POLYMYXIN 400-5-5000 EX OINT
TOPICAL_OINTMENT | CUTANEOUS | Status: DC | PRN
  Administered 2011-09-11: 1 via TOPICAL

## 2011-09-11 MED ORDER — CLONIDINE HCL 0.2 MG PO TABS
0.2000 mg | ORAL_TABLET | Freq: Two times a day (BID) | ORAL | Status: DC
  Administered 2011-09-11 – 2011-09-13 (×5): 0.2 mg via ORAL
  Filled 2011-09-11 (×6): qty 1

## 2011-09-11 MED ORDER — ACETAMINOPHEN 10 MG/ML IV SOLN
INTRAVENOUS | Status: DC | PRN
  Administered 2011-09-11: 1000 mg via INTRAVENOUS

## 2011-09-11 MED ORDER — LACTATED RINGERS IV SOLN
INTRAVENOUS | Status: DC
  Administered 2011-09-12 – 2011-09-13 (×3): via INTRAVENOUS

## 2011-09-11 MED ORDER — ALUM & MAG HYDROXIDE-SIMETH 400-400-40 MG/5ML PO SUSP
30.0000 mL | Freq: Four times a day (QID) | ORAL | Status: DC | PRN
  Filled 2011-09-11: qty 30

## 2011-09-11 MED ORDER — LIDOCAINE HCL (CARDIAC) 20 MG/ML IV SOLN
INTRAVENOUS | Status: DC | PRN
  Administered 2011-09-11: 60 mg via INTRAVENOUS

## 2011-09-11 SURGICAL SUPPLY — 40 items
BAG ZIPLOCK 12X15 (MISCELLANEOUS) ×2 IMPLANT
BENZOIN TINCTURE PRP APPL 2/3 (GAUZE/BANDAGES/DRESSINGS) ×2 IMPLANT
CLEANER TIP ELECTROSURG 2X2 (MISCELLANEOUS) ×2 IMPLANT
CLOTH BEACON ORANGE TIMEOUT ST (SAFETY) ×2 IMPLANT
CONT SPECI 4OZ STER CLIK (MISCELLANEOUS) ×2 IMPLANT
DRAIN PENROSE 18X1/4 LTX STRL (WOUND CARE) IMPLANT
DRAPE MICROSCOPE LEICA (MISCELLANEOUS) ×2 IMPLANT
DRAPE POUCH INSTRU U-SHP 10X18 (DRAPES) ×2 IMPLANT
DRAPE SURG 17X11 SM STRL (DRAPES) ×2 IMPLANT
DRSG ADAPTIC 3X8 NADH LF (GAUZE/BANDAGES/DRESSINGS) ×2 IMPLANT
DRSG PAD ABDOMINAL 8X10 ST (GAUZE/BANDAGES/DRESSINGS) ×8 IMPLANT
DURAPREP 26ML APPLICATOR (WOUND CARE) ×2 IMPLANT
ELECT REM PT RETURN 9FT ADLT (ELECTROSURGICAL) ×2
ELECTRODE REM PT RTRN 9FT ADLT (ELECTROSURGICAL) ×1 IMPLANT
GLOVE BIOGEL PI IND STRL 8.5 (GLOVE) ×1 IMPLANT
GLOVE BIOGEL PI INDICATOR 8.5 (GLOVE) ×1
GLOVE ECLIPSE 8.0 STRL XLNG CF (GLOVE) ×2 IMPLANT
GOWN PREVENTION PLUS LG XLONG (DISPOSABLE) ×4 IMPLANT
GOWN STRL REIN XL XLG (GOWN DISPOSABLE) ×4 IMPLANT
KIT BASIN OR (CUSTOM PROCEDURE TRAY) ×2 IMPLANT
KIT POSITIONING SURG ANDREWS (MISCELLANEOUS) ×2 IMPLANT
MANIFOLD NEPTUNE II (INSTRUMENTS) ×2 IMPLANT
NEEDLE SPNL 18GX3.5 QUINCKE PK (NEEDLE) ×4 IMPLANT
NS IRRIG 1000ML POUR BTL (IV SOLUTION) ×2 IMPLANT
PATTIES SURGICAL .5 X.5 (GAUZE/BANDAGES/DRESSINGS) IMPLANT
PATTIES SURGICAL .75X.75 (GAUZE/BANDAGES/DRESSINGS) IMPLANT
PATTIES SURGICAL 1X1 (DISPOSABLE) IMPLANT
PIN SAFETY NICK PLATE  2 MED (MISCELLANEOUS)
PIN SAFETY NICK PLATE 2 MED (MISCELLANEOUS) IMPLANT
POSITIONER SURGICAL ARM (MISCELLANEOUS) ×2 IMPLANT
SPONGE LAP 4X18 X RAY DECT (DISPOSABLE) IMPLANT
SPONGE SURGIFOAM ABS GEL 100 (HEMOSTASIS) ×2 IMPLANT
STAPLER VISISTAT 35W (STAPLE) IMPLANT
SUT VIC AB 0 CT1 27 (SUTURE) ×1
SUT VIC AB 0 CT1 27XBRD ANTBC (SUTURE) ×1 IMPLANT
SUT VIC AB 1 CT1 27 (SUTURE) ×4
SUT VIC AB 1 CT1 27XBRD ANTBC (SUTURE) ×4 IMPLANT
TOWEL OR 17X26 10 PK STRL BLUE (TOWEL DISPOSABLE) ×4 IMPLANT
TRAY LAMINECTOMY (CUSTOM PROCEDURE TRAY) ×2 IMPLANT
WATER STERILE IRR 1500ML POUR (IV SOLUTION) ×2 IMPLANT

## 2011-09-11 NOTE — Brief Op Note (Signed)
09/11/2011  10:15 AM  PATIENT:  [name]  55 y.o. female  PRE-OPERATIVE DIAGNOSIS:  Herniated Disc L-5--S-1 on right and Spinal Stenosis POST-OPERATIVE DIAGNOSIS:  herniated disc L-5--S-1 right  PROCEDURE:  Procedure(s): LUMBAR LAMINECTOMY/DECOMPRESSION MICRODISCECTOMY L-5--S-1 on right for Spinal Stenosis and HNP  SURGEON:  Surgeon(s): Windy Fast A Morena Mckissack James P Aplington  PHYSICIAN ASSISTANT:   ASSISTANTS: Dr. Fayrene Fearing Aplington   ANESTHESIA:   general  EBL:  Total I/O In: 1150 [I.V.:1000; IV Piggyback:150] Out: -   BLOOD ADMINISTERED:none  DRAINS: none   LOCAL MEDICATIONS USED:  MARCAINE 20CCExaparel plus 20cc Saline  SPECIMEN:  Source of Specimen:  L-5--S-1 on the Right  DISPOSITION OF SPECIMEN:  PATHOLOGY  COUNTS:  YES    DICTATION: .Other Dictation: Dictation Number [id number]  PLAN OF CARE: Admit for overnight observation  PATIENT DISPOSITION:  PACU - hemodynamically stable.

## 2011-09-11 NOTE — Transfer of Care (Signed)
Immediate Anesthesia Transfer of Care Note  Patient: [name]  Procedure(s) Performed:  LUMBAR LAMINECTOMY/DECOMPRESSION MICRODISCECTOMY - Hemi Laminectomy/Microdiscectomy L5 - S1 on the Right (X-Ray)  Patient Location: PACU  Anesthesia Type: General  Level of Consciousness: awake, alert  and oriented  Airway & Oxygen Therapy: Patient Spontanous Breathing and Patient connected to face mask oxygen  Post-op Assessment: Report given to PACU RN, Post -op Vital signs reviewed and stable and Patient moving all extremities  Post vital signs: Reviewed and stable  Complications: No apparent anesthesia complications

## 2011-09-11 NOTE — Op Note (Signed)
NAMEMARYJEAN, CORPENING                ACCOUNT NO.:  [id number]  MEDICAL RECORD NO.:  [id number]  LOCATION:  [facility]                         FACILITY:  [facility]  PHYSICIAN:  Georges Lynch. Haneef Hallquist, M.D.DATE OF BIRTH:  [birth year]  DATE OF PROCEDURE:  09/11/2011 DATE OF DISCHARGE:                              OPERATIVE REPORT   SURGEON:  Georges Lynch. Darrelyn Hillock, M.D.  ASSISTANT:  Marlowe Kays, M.D.  PREOPERATIVE DIAGNOSES: 1. Severe lateral recess stenosis, L5-S1 on the right. 2. Herniated lumbar disk to L5-S1 on the right.  Note she had right     leg pain preoperatively and some weakness of the right foot     preoperatively in regard to dorsiflex.  POSTOPERATIVE DIAGNOSES: 1. Severe lateral recess stenosis, L5-S1 on the right. 2. Herniated lumbar disk to L5-S1 on the right.  Note she had right     leg pain preoperatively and some weakness of the right foot     preoperatively in regard to dorsiflex.  OPERATION: 1. Decompressive lateral recess of L5-S1 on the right extensive     foraminotomy with the S1 root. 2. Microdiskectomy at L5-S1 on the right.  PROCEDURE:  Under general anesthesia, routine orthopedic prep and draping was carried out with the patient on the spinal frame.  She had 1 g of IV Ancef.  The appropriate time-out was carried out prior to placing any needles in the back or making incision.  I also marked the appropriate right side of the back in the holding area.  After the time- out, 2 needles were placed in the back for localization.  For localization purposes, x-ray was taken.  Incision was made over the L5- S1 interspace.  Bleeders identified and cauterized.  I then stripped the muscle bilaterally from the lamina spinous process.  I identified the sacrum I inserted the self-retaining retractors after I took another x- ray.  Following that, I identified L5-S1 and carried out my hemilaminectomy in usual fashion, both proximally and distally on out far laterally to decompress  the recess.  The microscope was brought in. I removed the ligamentum flavum with great care taken not to injure the underlying dura.  At this time, I identified the S1 root.  Highlighted the S1 root was quite swollen at this point.  We gently retracted that we cauterized lateral recess veins.  I then made a cruciate incision in the posterior longitudinal ligament, went down and completed microdiskectomy.  A instrument was placed in the space for identification purposes as well.  Following that, I went subligamentous with the Epstein curettes and a nerve hook, went in all directions and totally decompressed the space.  I went up into the foramen as well and removed the disk material from the foramen thoroughly irrigated out the area of the root and dura now was completely free.  I was able to easily pass a hockey-stick about all directions without any problem.  As I stated, the S1 root was quite swollen there in the right, thoroughly irrigated out the area, loosely applied some thrombin-soaked Gelfoam, and I closed the wound layers usual fashion.  I left a small deep distal and proximal part of the wound open for  drainage purposes.  The subcutaneous was closed with 0 Vicryl, skin with metal staples and a sterile Neosporin dressing applied.  The surgeon Dr. Darrelyn Hillock, assistant Dr. Simonne Come the during the procedure.  During the procedure, Dr. Simonne Come helped in the laminectomy as well as gently retracting the dura and also with the hemostasis.          ______________________________ Georges Lynch Darrelyn Hillock, M.D.     RAG/MEDQ  D:  09/11/2011  T:  09/11/2011  Job:  [id number]

## 2011-09-11 NOTE — Anesthesia Postprocedure Evaluation (Signed)
  Anesthesia Post-op Note  Patient: [name]  Procedure(s) Performed:  LUMBAR LAMINECTOMY/DECOMPRESSION MICRODISCECTOMY - Hemi Laminectomy/Microdiscectomy L5 - S1 on the Right (X-Ray)  Patient Location: PACU  Anesthesia Type: General  Level of Consciousness: awake and alert   Airway and Oxygen Therapy: Patient Spontanous Breathing  Post-op Pain: mild  Post-op Assessment: Post-op Vital signs reviewed, Patient's Cardiovascular Status Stable, Respiratory Function Stable, Patent Airway and No signs of Nausea or vomiting  Post-op Vital Signs: stable  Complications: No apparent anesthesia complications

## 2011-09-11 NOTE — Discharge Summary (Signed)
Physician Discharge Summary  Patient ID: [name] MRN: [id number] DOB/AGE: 1956/07/07 55 y.o.  Admit date: 09/11/2011  Discharge date: 09/13/2011 Admission Diagnoses: Herniated Disc and spinal stenosis.  Discharge Diagnoses: Same  Discharged Condition: stable   Hospital Course:  I reviewed vitals, labs, x-rays, and performed physical exam daily. There were no complicating factors post-operatively.   Prednisone started because of severe leg pain post op  Consults: none  Significant Diagnostic Studies: Xrays in OR  Operative Procedure:  Procedure(s): LUMBAR LAMINECTOMY/DECOMPRESSION MICRODISCECTOMY @L -5--S-1 on right  Date of Surgical Procedure: 09/11/2011  Disposition: DC Home today.  Discharge Orders    Future Appointments: Provider: Department: Dept Phone: Center:   11/13/2011 2:00 PM Lewayne Bunting, MD [facility] [phone] [id number]     Current Discharge Medication List    CONTINUE these medications which have NOT CHANGED   Details  aspirin (ADULT ASPIRIN EC LOW STRENGTH) 81 MG EC tablet Take 81 mg by mouth daily.      carvedilol (COREG) 25 MG tablet Take 25 mg by mouth 2 (two) times daily.     clonazePAM (KLONOPIN) 1 MG tablet Take 1 mg by mouth 3 (three) times daily.     cloNIDine (CATAPRES) 0.2 MG tablet Take 0.2 mg by mouth 2 (two) times daily.     Cyanocobalamin (VITAMIN B-12 PO) Take 1 tablet by mouth daily.      enalapril (VASOTEC) 20 MG tablet Take 20 mg by mouth 2 (two) times daily.     esomeprazole (NEXIUM) 40 MG capsule Take 40 mg by mouth 2 (two) times daily as needed. 1 capsule daily scheduled, will take an additional capsule if needed    furosemide (LASIX) 20 MG tablet Take 20 mg by mouth.     hydrALAZINE (APRESOLINE) 50 MG tablet Take 75 mg by mouth 3 (three) times daily.     HYDROmorphone (DILAUDID) 2 MG tablet Take 2 mg by mouth every 4 (four) hours as needed. For pain    lamoTRIgine (LAMICTAL) 25 MG tablet Take 25 mg by mouth  every evening.     Multiple Vitamins-Minerals (MULTIVITAMINS THER. W/MINERALS) TABS Take 1 tablet by mouth daily.      PRESCRIPTION MEDICATION every 2 (two) hours. Pain medication every 2 to 4 hours as needed for pain    spironolactone (ALDACTONE) 25 MG tablet Take 25 mg by mouth every morning.     zolpidem (AMBIEN CR) 12.5 MG CR tablet Take 12.5 mg by mouth at bedtime as needed. For sleep    DULoxetine (CYMBALTA) 60 MG capsule Take 60 mg by mouth every evening.     L-Methylfolate (DEPLIN) 7.5 MG TABS Take 1 tablet by mouth every evening.     oxyCODONE-acetaminophen (PERCOCET) 10-650 MG per tablet Take 1 tablet by mouth every 6 (six) hours as needed.           Discharge Instructions: Change your dressing daily.Continue your Prednisone at home as instructed. Shower only, no tub bath. Call if any temperatures greater than 101 or any wound complications: [phone] during the day and ask for Dr. Jeannetta Ellis nurse, Mackey [place].  Signed: Jacki Cones 09/12/2011

## 2011-09-11 NOTE — Interval H&P Note (Signed)
History and Physical Interval Note:  09/11/2011 8:03 AM  [name]  has presented today for surgery, with the diagnosis of Herniated Disc  The various methods of treatment have been discussed with the patient and family. After consideration of risks, benefits and other options for treatment, the patient has consented to  Procedure(s): LUMBAR LAMINECTOMY/DECOMPRESSION MICRODISCECTOMY as a surgical intervention .  The patients' history has been reviewed, patient examined, no change in status, stable for surgery.  I have reviewed the patients' chart and labs.  Questions were answered to the patient's satisfaction.     Iyauna Sing A

## 2011-09-11 NOTE — H&P (View-Only) (Signed)
[facility] SITE 3 NUCLEAR MED [address] [place] [address] [phone]  Cardiology Nuclear Med Study  [name] is a 55 y.o. female [id number] 02/03/1956   Nuclear Med Background Indication for Stress Test:  Evaluation for Ischemia and Pending Surgical Clearance for Spinal Surgery by Dr. Darrelyn Hillock History:  '10 Echo:EF=65%; '10 QIO:NGEXBM, EF=64%; H/O CHF; ~6 months ago Echo @ [facility] per patient Cardiac Risk Factors: Family History - CAD, History of Smoking, Hypertension, Lipids and Diet Controlled NIDDM  Symptoms:  Chest Pain (last episode of chest discomfort was about six months ago when admitted to [facility] with CP and syncope), Dizziness, DOE, Fatigue, Nausea, Palpitations, Rapid HR and Syncope   Nuclear Pre-Procedure Caffeine/Decaff Intake:  None NPO After: 7:00am   Lungs:  Clear.  O2 SAT 98% on RA. IV 0.9% NS with Angio Cath:  22g  IV Site: R Hand  IV Started by:  Bonnita Levan, RN  Chest Size (in):  40 Cup Size: D  Height: 5\' 6"  (1.676 m)  Weight:  168 lb (76.204 kg)  BMI:  Body mass index is 27.12 kg/(m^2). Tech Comments:  Patient held Lasix and Carvedilol this AM    Nuclear Med Study 1 or 2 day study: 1 day  Stress Test Type:  Lexiscan  Reading MD: Charlton Haws, MD  Order Authorizing Provider:  Olga Millers; Tereso Newcomer, PA-C; Ranee Gosselin, MD  Resting Radionuclide: Technetium 28m Tetrofosmin  Resting Radionuclide Dose: 10.9 mCi   Stress Radionuclide:  Technetium 46m Tetrofosmin  Stress Radionuclide Dose: 33 mCi           Stress Protocol Rest HR: 78 Stress HR: 100  Rest BP: 151/99 Stress BP: 153/102 and 171/91  Exercise Time (min): n/a METS: n/a   Predicted Max HR: 166 bpm % Max HR: 60.24 bpm Rate Pressure Product: 84132   Dose of Adenosine (mg):  n/a Dose of Lexiscan: 0.4 mg  Dose of Atropine (mg): n/a Dose of Dobutamine: n/a mcg/kg/min (at max HR)  Stress Test Technologist: Smiley Houseman, CMA-N  Nuclear  Technologist:  Domenic Polite, CNMT     Rest Procedure:  Myocardial perfusion imaging was performed at rest 45 minutes following the intravenous administration of Technetium 66m Tetrofosmin.  Rest ECG: Diffuse T-wave changes.  Stress Procedure:  The patient received IV Lexiscan 0.4 mg over 15-seconds.  Technetium 68m Tetrofosmin injected at 30-seconds.  There were no significant changes with Lexiscan.  Quantitative spect images were obtained after a 45 minute delay.  Stress ECG: No significant change from baseline ECG  QPS Raw Data Images:  There is a breast shadow that accounts for the anterior attenuation. Stress Images:  Decreased upstake in anterior wall Rest Images:  Decreased uptake in anterior wall Subtraction (SDS):  SDS 2 abnormal at apex Transient Ischemic Dilatation (Normal <1.22):  1.0 Lung/Heart Ratio (Normal <0.45):  .21  Quantitative Gated Spect Images QGS EDV:  71 ml QGS ESV:  20 ml QGS cine images:  NL LV Function; NL Wall Motion QGS EF: 71%  Impression Exercise Capacity:  Lexiscan with no exercise. BP Response:  Normal blood pressure response. Clinical Symptoms:  No chest pain. ECG Impression:  Baseline ECG LVH with strain Comparison with Prior Nuclear Study: No images to compare  Overall Impression:   Breast attenuation.  Low risk study  EF 71%    Charlton Haws

## 2011-09-11 NOTE — Preoperative (Signed)
Beta Blockers   Reason not to administer Beta Blockers:Patient took Coreg this am at 0530

## 2011-09-12 ENCOUNTER — Encounter (HOSPITAL_COMMUNITY): Payer: Self-pay | Admitting: Orthopedic Surgery

## 2011-09-12 MED ORDER — PREDNISONE (PAK) 10 MG PO TABS
20.0000 mg | ORAL_TABLET | Freq: Every morning | ORAL | Status: AC
  Administered 2011-09-12: 20 mg via ORAL
  Filled 2011-09-12: qty 21

## 2011-09-12 MED ORDER — PREDNISONE (PAK) 10 MG PO TABS
10.0000 mg | ORAL_TABLET | ORAL | Status: AC
  Administered 2011-09-12: 10 mg via ORAL

## 2011-09-12 MED ORDER — HYDROMORPHONE HCL 2 MG PO TABS
2.0000 mg | ORAL_TABLET | ORAL | Status: DC | PRN
  Administered 2011-09-12 – 2011-09-13 (×7): 2 mg via ORAL
  Filled 2011-09-12 (×7): qty 1

## 2011-09-12 MED ORDER — PREDNISONE (PAK) 10 MG PO TABS: 10.0000 mg | ORAL_TABLET | Freq: Four times a day (QID) | ORAL | Status: DC

## 2011-09-12 MED ORDER — PREDNISONE (PAK) 10 MG PO TABS
20.0000 mg | ORAL_TABLET | Freq: Every evening | ORAL | Status: AC
  Administered 2011-09-12: 20 mg via ORAL

## 2011-09-12 MED ORDER — PREDNISONE (PAK) 10 MG PO TABS
10.0000 mg | ORAL_TABLET | Freq: Three times a day (TID) | ORAL | Status: DC
  Administered 2011-09-13 (×2): 10 mg via ORAL

## 2011-09-12 MED ORDER — DOCUSATE SODIUM 100 MG PO CAPS
100.0000 mg | ORAL_CAPSULE | Freq: Two times a day (BID) | ORAL | Status: DC
  Administered 2011-09-12 – 2011-09-13 (×3): 100 mg via ORAL
  Filled 2011-09-12 (×4): qty 1

## 2011-09-12 MED ORDER — PREDNISONE (PAK) 10 MG PO TABS: 20.0000 mg | ORAL_TABLET | Freq: Every evening | ORAL | Status: DC

## 2011-09-12 NOTE — Progress Notes (Signed)
09/12/2011, Kathi Der RNC-MNN, BSN.  CM reviewed orders.  OT has recommended HH OT and 3N1.  Will follow for additional needs.

## 2011-09-12 NOTE — Progress Notes (Signed)
Physical Therapy Evaluation Patient Details Name: [name] MRN: [id number] DOB: January 15, 1956 Today's Date: 09/12/2011  Problem List:  Patient Active Problem List  Diagnoses  . HYPERLIPIDEMIA NEC/NOS  . BIPOLAR DISORDER UNSPECIFIED  . HYPERTENSION, BENIGN ESSENTIAL  . HIRSUTISM  . Pain in Soft Tissues of Limb  . SLEEP DISORDER  . FATIGUE  . DYSPNEA  . CHEST PAIN UNSPECIFIED  . OTHER ABNORMAL GLUCOSE  . Syncope and collapse  . DDD (degenerative disc disease), lumbar  . 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 HLD (hyperlipidemia)     nec/nos  . HTN (hypertension)     benign essential  . Narcolepsy   . Glucose intolerance (impaired glucose tolerance)   . Fatigue   . Chronic diastolic heart failure     hospitalized at [facility]  . Bipolar disorder   . Suicide attempt     hx  . Syncope     admx to [facility] in 2/12: Echo with normal LVF; head CT unremarkable, ECG stable, no further w/u  . Complication of anesthesia     had "breathing problems and ended up in ICU" 03/2010 report on chart  . CHF (congestive heart failure)   . MVP (mitral valve prolapse)   . DDD (degenerative disc disease), lumbar   . GERD (gastroesophageal reflux disease)   . Ulcer   . Diabetes in pregnancy     diet cnontrolled  . Bipolar 1 disorder    Past Surgical History:  Past Surgical History  Procedure Date  . Partial hysterectomy   . Bladder suspension   . Gastric bypass   . Repair peroneal tendons ankle     PT Assessment/Plan/Recommendation PT Assessment Clinical Impression Statement: 55 y.o. female admitted to [facility] s/p Hemi Laminectomy/Microdiscectomy L5 - S1 on the Right.  The patient presents with increased pain, decreased mobility, decreased awareness of precautions, decreased strength, balance and gait.  She would benefit from skilled PT in the acute care setting to maximize  independence, functional mobility and safety so that she may be able to return home with husband's assist and supervision at discharge.   PT Recommendation/Assessment: Patient will need skilled PT in the acute care venue PT Problem List: Decreased strength;Decreased activity tolerance;Decreased balance;Decreased mobility;Decreased knowledge of use of DME;Decreased knowledge of precautions;Pain PT Therapy Diagnosis : Difficulty walking;Abnormality of gait;Generalized weakness;Acute pain PT Plan PT Frequency: Min 5X/week PT Treatment/Interventions: DME instruction;Gait training;Stair training;Functional mobility training;Therapeutic activities;Therapeutic exercise;Balance training;Neuromuscular re-education;Patient/family education PT Recommendation Follow Up Recommendations: Home health PT Equipment Recommended:  (wheel attachments for her standard walker) PT Goals  Acute Rehab PT Goals PT Goal Formulation: With patient Time For Goal Achievement: 7 days Pt will Roll Supine to Right Side: with modified independence PT Goal: Rolling Supine to Right Side - Progress: Not met Pt will Roll Supine to Left Side: with modified independence PT Goal: Rolling Supine to Left Side - Progress: Not met Pt will go Supine/Side to Sit: with modified independence;with HOB 0 degrees PT Goal: Supine/Side to Sit - Progress: Not met Pt will go Sit to Supine/Side: with modified independence;with HOB 0 degrees PT Goal: Sit to Supine/Side - Progress: Not met Pt will go Sit to Stand: with modified independence PT Goal: Sit to Stand - Progress: Progressing toward goal Pt will go Stand to Sit: with modified independence PT Goal: Stand to Sit - Progress: Progressing toward goal Pt will Transfer Bed  to Chair/Chair to Bed: with modified independence PT Transfer Goal: Bed to Chair/Chair to Bed - Progress: Not met Pt will Ambulate: >150 feet;with modified independence;with rolling walker PT Goal: Ambulate - Progress: Not  met Additional Goals Additional Goal #1: Patient will report 3/3 back precautions independently PT Goal: Additional Goal #1 - Progress: Progressing toward goal  PT Evaluation Precautions/Restrictions  Precautions Precautions: Back Precaution Booklet Issued: Yes (comment) Required Braces or Orthoses: No Prior Functioning  Home Living Lives With: Spouse;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 (husband to take a few days off and use FMLA to help) Type of Home: House Home Layout: One level Home Access: Level entry Bathroom Shower/Tub: Tub/shower unit;Curtain Bathroom Toilet: Handicapped height Home Adaptive Equipment: Walker - standard;Wheelchair - manual;Straight cane Prior Function Level of Independence: Independent with basic ADLs;Independent with homemaking with ambulation;Independent with gait;Independent with transfers Driving: Yes Cognition Cognition Arousal/Alertness: Awake/alert Overall Cognitive Status: Appears within functional limits for tasks assessed Orientation Level: Oriented X4 Sensation/Coordination Sensation Light Touch: Appears Intact Extremity Assessment RUE Assessment RUE Assessment: Within Functional Limits LUE Assessment LUE Assessment: Within Functional Limits RLE Assessment RLE Assessment: Exceptions to [facility] RLE Strength RLE Overall Strength: Deficits;Due to pain (pain in right hip with transitions and upon standing.) RLE Overall Strength Comments: grossly 5/5 per functional assessment.   LLE Assessment LLE Assessment: Within Functional Limits Mobility (including Balance) Bed Mobility Bed Mobility: Yes Left Sidelying to Sit: 4: Min assist Left Sidelying to Sit Details (indicate cue type and reason): educated on log roll technique. patient on her side when OT arrived. Transfers Transfers: Yes Sit to Stand: 4: Min assist Sit to Stand Details (indicate cue type and reason): min assist to support trunk fro balance during  transition from armrests on chair to RW.  Verbal cues for safe hand placement.   Stand to Sit: 4: Min assist Stand to Sit Details: to help control descent to sit.  Verbal cues for slow lowering and hand placement.   Ambulation/Gait Ambulation/Gait: No (patient unable at this time secondary to pain in R hip.)    Exercise    End of Session PT - End of Session Equipment Utilized During Treatment:  (RW) Activity Tolerance: Patient limited by pain Patient left: in chair;with call bell in reach Nurse Communication: Mobility status for transfers General Behavior During Session: [facility] for tasks performed Cognition: [facility] for tasks performed  Lovetta Condie B. Daltyn Degroat, PT, DPT [phone]  09/12/2011, 3:31 PM

## 2011-09-12 NOTE — Progress Notes (Signed)
Occupational Therapy Evaluation Patient Details Name: [name] MRN: [id number] DOB: October 06, 1956 Today's Date: 09/12/2011 Time in: 9:21 am Time out: 10:00am Eval II El[place]  Problem List:  Patient Active Problem List  Diagnoses  . HYPERLIPIDEMIA NEC/NOS  . BIPOLAR DISORDER UNSPECIFIED  . HYPERTENSION, BENIGN ESSENTIAL  . HIRSUTISM  . Pain in Soft Tissues of Limb  . SLEEP DISORDER  . FATIGUE  . DYSPNEA  . CHEST PAIN UNSPECIFIED  . OTHER ABNORMAL GLUCOSE  . Syncope and collapse  . DDD (degenerative disc disease), lumbar  . 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 HLD (hyperlipidemia)     nec/nos  . HTN (hypertension)     benign essential  . Narcolepsy   . Glucose intolerance (impaired glucose tolerance)   . Fatigue   . Chronic diastolic heart failure     hospitalized at [facility]  . Bipolar disorder   . Suicide attempt     hx  . Syncope     admx to [facility] in 2/12: Echo with normal LVF; head CT unremarkable, ECG stable, no further w/u  . Complication of anesthesia     had "breathing problems and ended up in ICU" 03/2010 report on chart  . CHF (congestive heart failure)   . MVP (mitral valve prolapse)   . DDD (degenerative disc disease), lumbar   . GERD (gastroesophageal reflux disease)   . Ulcer   . Diabetes in pregnancy     diet cnontrolled  . Bipolar 1 disorder    Past Surgical History:  Past Surgical History  Procedure Date  . Partial hysterectomy   . Bladder suspension   . Gastric bypass   . Repair peroneal tendons ankle     OT Assessment/Plan/Recommendation OT Assessment Clinical Impression Statement: Patient will benefit from OT services to increase independence and awareness of back precautions with ADL for discharge home with spouse.  OT Recommendation/Assessment: Patient will need skilled OT in the acute care venue OT Problem List: Decreased  strength;Decreased activity tolerance;Decreased knowledge of use of DME or AE;Decreased knowledge of precautions;Pain OT Therapy Diagnosis : Generalized weakness;Acute pain OT Plan OT Frequency: Min 2X/week OT Treatment/Interventions: Self-care/ADL training;Therapeutic activities;DME and/or AE instruction;Patient/family education OT Recommendation Follow Up Recommendations: Home health OT Equipment Recommended: 3 in 1 bedside comode Individuals Consulted Consulted and Agree with Results and Recommendations: Patient OT Goals Acute Rehab OT Goals OT Goal Formulation: With patient Time For Goal Achievement: 7 days ADL Goals Pt Will Perform Grooming: with supervision;Standing at sink ADL Goal: Grooming - Progress: Progressing toward goals Pt Will Perform Upper Body Bathing: with set-up;Sitting, chair;Sitting, edge of bed ADL Goal: Upper Body Bathing - Progress: Progressing toward goals Pt Will Perform Lower Body Bathing: with supervision;Sit to stand from chair;Sit to stand from bed;with adaptive equipment ADL Goal: Lower Body Bathing - Progress: Progressing toward goals Pt Will Perform Upper Body Dressing: with set-up;Sitting, bed;Sitting, chair ADL Goal: Upper Body Dressing - Progress: Progressing toward goals Pt Will Perform Lower Body Dressing: with supervision;Sit to stand from chair;Sit to stand from bed;with adaptive equipment ADL Goal: Lower Body Dressing - Progress: Progressing toward goals Pt Will Transfer to Toilet: with supervision;with DME;3-in-1;Ambulation ADL Goal: Toilet Transfer - Progress: Progressing toward goals Pt Will Perform Toileting - Clothing Manipulation: with supervision;Standing ADL Goal: Toileting - Clothing Manipulation - Progress: Progressing toward goals Pt Will Perform Toileting - Hygiene: with supervision;Standing at 3-in-1/toilet ADL  Goal: Toileting - Hygiene - Progress: Progressing toward goals Pt Will Perform Tub/Shower Transfer: with supervision;with  DME;Other (comment) (may need a tubseat) ADL Goal: Tub/Shower Transfer - Progress: Progressing toward goals  OT Evaluation Precautions/Restrictions  Precautions Precautions: Back Precaution Booklet Issued: Yes (comment) Prior Functioning Home Living Lives With: Spouse;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 (husband to take a few days off and use FMLA to help) Type of Home: House Home Layout: One level Home Access: Level entry Bathroom Shower/Tub: Tub/shower unit;Curtain Bathroom Toilet: Handicapped height Home Adaptive Equipment: Walker - standard;Wheelchair - manual;Straight cane Prior Function Level of Independence: Independent with basic ADLs;Independent with homemaking with ambulation;Independent with gait;Independent with transfers Driving: Yes ADL ADL Eating/Feeding: Simulated;Independent Where Assessed - Eating/Feeding: Edge of bed Grooming: Performed;Minimal assistance Grooming Details (indicate cue type and reason): min guard assist Where Assessed - Grooming: Standing at sink Upper Body Bathing: Simulated;Chest;Right arm;Left arm;Abdomen;Set up;Supervision/safety Where Assessed - Upper Body Bathing: Sitting, bed;Unsupported Lower Body Bathing: Simulated;Moderate assistance Lower Body Bathing Details (indicate cue type and reason): without adaptive equipment Where Assessed - Lower Body Bathing: Sit to stand from bed Upper Body Dressing: Simulated;Set up;Supervision/safety Where Assessed - Upper Body Dressing: Sitting, bed;Unsupported Lower Body Dressing: Maximal assistance Lower Body Dressing Details (indicate cue type and reason): without adaptive equipment Where Assessed - Lower Body Dressing: Sit to stand from bed Toilet Transfer: Performed;Minimal assistance Toilet Transfer Details (indicate cue type and reason): min verbal cues for hand placement and back precautions Toilet Transfer Method: Ambulating Toilet Transfer Equipment: Raised  toilet seat with arms (or 3-in-1 over toilet) Toileting - Clothing Manipulation: Minimal assistance Where Assessed - Toileting Clothing Manipulation: Sit to stand from 3-in-1 or toilet Toileting - Hygiene: Performed;Minimal assistance Where Assessed - Toileting Hygiene: Standing Tub/Shower Transfer: Not assessed Tub/Shower Transfer Method: Not assessed Equipment Used: Rolling walker ADL Comments: Patient with significant pain at rest and with activity even with being premedicated. She is pleasant and very motivated to return to independent level. Will need to assess for AE needs. Reviewed back precautions.  Vision/Perception  Vision - History Baseline Vision: No visual deficits Cognition Cognition Arousal/Alertness: Awake/alert Overall Cognitive Status: Appears within functional limits for tasks assessed Orientation Level: Oriented X4 Sensation/Coordination Sensation Light Touch: Appears Intact (for upper extremities.) Extremity Assessment RUE Assessment RUE Assessment: Within Functional Limits LUE Assessment LUE Assessment: Within Functional Limits Mobility  Bed Mobility Bed Mobility: Yes Left Sidelying to Sit: 4: Min assist Left Sidelying to Sit Details (indicate cue type and reason): educated on log roll technique. patient on her side when OT arrived. Transfers Transfers: Yes Sit to Stand: 4: Min assist Sit to Stand Details (indicate cue type and reason): min assist to support trunk fro balance during transition from armrests on chair to RW.  Verbal cues for safe hand placement.   Stand to Sit: 4: Min assist Stand to Sit Details: slowly due to increased pain reported in right hip Exercises   End of Session OT - End of Session Equipment Utilized During Treatment: Other (comment) (rolling walker) Activity Tolerance: Patient limited by pain Patient left: in chair;with call bell in reach Nurse Communication: Mobility status for transfers General Behavior During Session:  [facility] for tasks performed Cognition: [facility] for tasks performed   Lennox Laity [phone] 09/12/2011, 12:05 PM

## 2011-09-12 NOTE — Progress Notes (Signed)
Subjective: Significant right leg pain today will KEEP HER ANOTHER DAY FOR PAIN CONTROL.  Will start Prednisone .  Objective: Vital signs in last 24 hours: Temp:  [97 F (36.1 C)-99.7 F (37.6 C)] 99.7 F (37.6 C) (12/06 0546) Pulse Rate:  [45-84] 83  (12/06 0546) Resp:  [8-18] 16  (12/06 0546) BP: (100-162)/(63-101) 120/76 mmHg (12/06 0546) SpO2:  [96 %-100 %] 97 % (12/06 0546) Weight:  [74.844 kg (165 lb)] 165 lb (74.844 kg) (12/05 1343)  Intake/Output from previous day: 12/05 0701 - 12/06 0700 In: 2575 [P.O.:240; I.V.:2185; IV Piggyback:150] Out: 2300 [Urine:2300] Intake/Output this shift:    No results found for this basename: HGB:5 in the last 72 hours No results found for this basename: WBC:2,RBC:2,HCT:2,PLT:2 in the last 72 hours No results found for this basename: NA:2,K:2,CL:2,CO2:2,BUN:2,CREATININE:2,GLUCOSE:2,CALCIUM:2 in the last 72 hours No results found for this basename: LABPT:2,INR:2 in the last 72 hours  Neurologically intact Dorsiflexion/Plantar flexion intact  Assessment/Plan: KEEP FOR PAIN CONTROL,UNTIL TOMORROW   Che Rachal A 09/12/2011, 7:17 AM

## 2011-09-12 NOTE — Progress Notes (Signed)
Physical Therapy Treatment Patient Details Name: [name] MRN: [id number] DOB: [birth year] Today's Date: 09/12/2011  PT Assessment/Plan  PT - Assessment/Plan Comments on Treatment Session: Patient was tearful throughout the treatment session.  "I didn't think it was going to be like this" re: post-op pain.  She moves very well with very little assistance despite her pain and I anticipate she will progress well enough to go home with her husband.   PT Plan: Discharge plan remains appropriate PT Frequency: Min 5X/week Follow Up Recommendations: Home health PT Equipment Recommended:  (needs wheel attachments for her standard walker.) PT Goals  Acute Rehab PT Goals PT Goal Formulation: With patient Time For Goal Achievement: 7 days Pt will Roll Supine to Right Side: Independently PT Goal: Rolling Supine to Right Side - Progress: Revised (modified due to lack of progress/goal met) Pt will Roll Supine to Left Side: with modified independence PT Goal: Rolling Supine to Left Side - Progress: Not met Pt will go Supine/Side to Sit: with modified independence;with HOB 0 degrees PT Goal: Supine/Side to Sit - Progress: Not met Pt will go Sit to Supine/Side: with modified independence;with HOB 0 degrees PT Goal: Sit to Supine/Side - Progress: Progressing toward goal Pt will go Sit to Stand: with modified independence PT Goal: Sit to Stand - Progress: Progressing toward goal Pt will go Stand to Sit: with modified independence PT Goal: Stand to Sit - Progress: Progressing toward goal Pt will Transfer Bed to Chair/Chair to Bed: with modified independence PT Transfer Goal: Bed to Chair/Chair to Bed - Progress: Not met Pt will Ambulate: >150 feet;with modified independence;with rolling walker PT Goal: Ambulate - Progress: Progressing toward goal Additional Goals Additional Goal #1: Patient will report 3/3 back precautions independently PT Goal: Additional Goal #1 - Progress: Progressing toward  goal  PT Treatment Precautions/Restrictions  Precautions Precautions: Back Precaution Booklet Issued: Yes (comment)-given by OT Required Braces or Orthoses: No Mobility (including Balance) Bed Mobility Bed Mobility: Yes Rolling Right: 6: Modified independent (Device/Increase time);With rail Sit to Supine - Right: 4: Min assist;With rail;HOB flat Sit to Supine - Right Details (indicate cue type and reason): min assist of bil legs back into bed.  Verbal cues for safe technique.   Transfers Transfers: Yes Sit to Stand: 4: Min assist;From chair/3-in-1 Sit to Stand Details (indicate cue type and reason): min guard assist to make sure that patient's legs would support her.  Stand to Sit: 4: Min assist;To bed;To chair/3-in-1 Stand to Sit Details: to help control descent to sit Ambulation/Gait Ambulation/Gait: Yes Ambulation/Gait Assistance: 4: Min assist Ambulation/Gait Assistance Details (indicate cue type and reason): min guard assist for safety secondary to patient reporting so much right leg and hip pain Ambulation Distance (Feet): 20 Feet Assistive device: Rolling walker Gait Pattern: Shuffle;Antalgic;Decreased stance time - right;Decreased weight shift to right    Exercise    End of Session PT - End of Session Equipment Utilized During Treatment:  (RW) Activity Tolerance: Patient limited by pain Patient left: in bed;with call bell in reach;Other (comment) (lying on her right side with pillow between her knees ) Nurse Communication:  (called for pain meds) General Behavior During Session: [facility] for tasks performed Cognition: [facility] for tasks performed  [name], Claretta Fraise 09/12/2011, 4:12 PM

## 2011-09-13 MED ORDER — PREDNISONE (PAK) 10 MG PO TABS: 10.0000 mg | ORAL_TABLET | Freq: Three times a day (TID) | ORAL | Status: AC

## 2011-09-13 NOTE — Progress Notes (Signed)
Subjective: Improved on Prednisone. Has voided    Objective: Vital signs in last 24 hours: Temp:  [98 F (36.7 C)-99.7 F (37.6 C)] 98.4 F (36.9 C) (12/07 0520) Pulse Rate:  [70-104] 70  (12/07 0520) Resp:  [18-20] 18  (12/07 0520) BP: (106-154)/(70-90) 106/70 mmHg (12/07 0520) SpO2:  [92 %-97 %] 96 % (12/07 0520)  Intake/Output from previous day: 12/06 0701 - 12/07 0700 In: 1380 [P.O.:480; I.V.:900] Out: 2600 [Urine:2600] Intake/Output this shift: Total I/O In: 480 [P.O.:480] Out: 1600 [Urine:1600]  No results found for this basename: HGB:5 in the last 72 hours No results found for this basename: WBC:2,RBC:2,HCT:2,PLT:2 in the last 72 hours No results found for this basename: NA:2,K:2,CL:2,CO2:2,BUN:2,CREATININE:2,GLUCOSE:2,CALCIUM:2 in the last 72 hours No results found for this basename: LABPT:2,INR:2 in the last 72 hours  Neurologically intact Dorsiflexion/Plantar flexion intact Incision: dressing C/D/I and no drainage  Assessment/Plan: Plan on DC later today.   Shateria Paternostro A 09/13/2011, 6:42 AM

## 2011-09-13 NOTE — Progress Notes (Signed)
Physical Therapy Treatment Patient Details Name: [name] MRN: [id number] DOB: May 09, 1956 Today's Date: 09/13/2011  PT Assessment/Plan  PT - Assessment/Plan Comments on Treatment Session: The patient is progressing slowly, but mostly moving on her own power.  Supervision only needed to make sure she is safe.   PT Plan: Discharge plan remains appropriate Follow Up Recommendations: Home health PT Equipment Recommended: 3 in 1 bedside comode (wheel attachments for her standard walker) PT Goals  Acute Rehab PT Goals PT Goal: Sit to Stand - Progress: Progressing toward goal PT Goal: Stand to Sit - Progress: Progressing toward goal PT Goal: Ambulate - Progress: Progressing toward goal Additional Goals PT Goal: Additional Goal #1 - Progress: Progressing toward goal (she named 2/3 today)  PT Treatment Precautions/Restrictions  Precautions Precautions: Back Precaution Booklet Issued: Yes (comment) Required Braces or Orthoses: No Restrictions Weight Bearing Restrictions: No Mobility (including Balance) Transfers Sit to Stand: 5: Supervision;From chair/3-in-1;With upper extremity assist;With armrests Sit to Stand Details (indicate cue type and reason): close supervision for safety.  Patient very slow to move secondary to right hip pain.   Stand to Sit: 5: Supervision;With upper extremity assist;With armrests;To chair/3-in-1 Stand to Sit Details: supervision for safety, patient lowering slowly to chair with arms.   Ambulation/Gait Ambulation/Gait Assistance: 5: Supervision Ambulation/Gait Assistance Details (indicate cue type and reason): close supervision needed for safetly Ambulation Distance (Feet): 65 Feet Assistive device: Rolling walker Gait Pattern: Shuffle    Exercise    End of Session PT - End of Session Equipment Utilized During Treatment:  (RW) Activity Tolerance: Patient limited by pain Patient left: in chair;with call bell in reach;with family/visitor present  (husband came in at end of session) General Behavior During Session: [name] for tasks performed Cognition: [name] for tasks performed  Areta Terwilliger B. Lynnley Doddridge, PT, DPT [phone]  09/13/2011, 12:52 PM

## 2011-09-13 NOTE — Progress Notes (Signed)
Occupational Therapy Treatment Patient Details Name: [name] MRN: [id number] DOB: 1956-05-31 Today's Date: 09/13/2011 0820 [id number] 2 [place] OT Assessment/Plan OT Assessment/Plan OT Plan: Discharge plan remains appropriate OT Frequency: Min 2X/week Follow Up Recommendations: Home health OT Equipment Recommended: 3 in 1 bedside comode;Other (comment) (HHOT can further assess for tub bench) OT Goals ADL Goals ADL Goal: Grooming - Progress: Not addressed ADL Goal: Upper Body Bathing - Progress: Not addressed ADL Goal: Lower Body Bathing - Progress: Other (comment) (simulated with reacher) ADL Goal: Upper Body Dressing - Progress: Not addressed ADL Goal: Lower Body Dressing - Progress: Progressing toward goals ADL Goal: Toilet Transfer - Progress: Not addressed ADL Goal: Toileting - Clothing Manipulation - Progress: Not addressed ADL Goal: Toileting - Hygiene - Progress: Progressing toward goals ADL Goal: Tub/Shower Transfer - Progress: Not met  OT Treatment Precautions/Restrictions  Precautions Precautions: Back Required Braces or Orthoses: No Restrictions Weight Bearing Restrictions: No   ADL ADL Lower Body Bathing: Simulated;Supervision/safety Where Assessed - Lower Body Bathing: Sit to stand from bed;Other (comment) (with AE) Lower Body Dressing: Performed;Minimal assistance;Other (comment) (socks with sock aid; simulated pants with reacher) Where Assessed - Lower Body Dressing: Sit to stand from bed Toileting - Hygiene: Simulated;Other (comment) (pt feels she is OK:  no twisting noted but simulated only) Tub/Shower Transfer: Other (comment) (educated on method for stepping over tub; pt not ready.  ) Equipment Used: Reacher;Sock aid;Other (comment) (educated on long sponge; wears slide on shoes) ADL Comments: pain interferes with function.  Pt had performed ADL prior to OT.  Educated on AE and back precautions with ADLS. May want to consider tub bench Mobility  Bed Mobility Bed  Mobility: Yes Sit to Supine - Right: 5: Supervision;With rail;HOB flat Sit to Supine - Right Details (indicate cue type and reason): assisted to place pillow between legs in sidelying Transfers Sit to Stand: 5: Supervision;With armrests;Other (comment) (surface high; would beneift from 3:1 commode over her toilet) Stand to Sit: 5: Supervision Exercises    End of Session OT - End of Session Activity Tolerance: Patient limited by pain Patient left: in bed;with call bell in reach Nurse Communication: Other (comment) (pain level) General Behavior During Session: [name] for tasks performed Cognition: [facility] for tasks performed  [name] [phone] 09/13/2011, 9:25 AM

## 2011-09-13 NOTE — Progress Notes (Signed)
CM reviewed orders.  HH PT has been rec. for pt as well.  Pt will be using [facility] for [facility] services.

## 2011-09-13 NOTE — Discharge Instructions (Signed)
Change your dressing daily. °Shower only, no tub bath. °Call if any temperatures greater than 101 or any wound complications: [phone] during the day and ask for Dr. Dareld Mcauliffe's nurse, Tammy Johnson. °

## 2011-09-13 NOTE — Progress Notes (Signed)
Discharge instructions and prescriptions given to spouse.  Left via wheelchair, with spouse.  No questions.  Patient to have [place] for home health pt/ot and advanced home care to bring 3 in 1 chair as well as fit for wheels for walker.

## 2011-10-16 DIAGNOSIS — M5126 Other intervertebral disc displacement, lumbar region: Secondary | ICD-10-CM | POA: Diagnosis not present

## 2011-10-21 DIAGNOSIS — F314 Bipolar disorder, current episode depressed, severe, without psychotic features: Secondary | ICD-10-CM | POA: Diagnosis not present

## 2011-10-22 DIAGNOSIS — M5126 Other intervertebral disc displacement, lumbar region: Secondary | ICD-10-CM | POA: Diagnosis not present

## 2011-10-24 DIAGNOSIS — M5126 Other intervertebral disc displacement, lumbar region: Secondary | ICD-10-CM | POA: Diagnosis not present

## 2011-10-28 DIAGNOSIS — M5126 Other intervertebral disc displacement, lumbar region: Secondary | ICD-10-CM | POA: Diagnosis not present

## 2011-11-10 DIAGNOSIS — J209 Acute bronchitis, unspecified: Secondary | ICD-10-CM | POA: Diagnosis not present

## 2011-11-10 DIAGNOSIS — R0602 Shortness of breath: Secondary | ICD-10-CM | POA: Diagnosis not present

## 2011-11-11 DIAGNOSIS — M1610 Unilateral primary osteoarthritis, unspecified hip: Secondary | ICD-10-CM | POA: Diagnosis not present

## 2011-11-11 DIAGNOSIS — M169 Osteoarthritis of hip, unspecified: Secondary | ICD-10-CM | POA: Diagnosis not present

## 2011-11-13 ENCOUNTER — Encounter: Payer: Federal, State, Local not specified - PPO | Admitting: Cardiology

## 2011-11-13 NOTE — Progress Notes (Signed)
HPI: Pleasant female previously seen for hypertension, shortness of breath and chest pain. A nuclear study was performed on 05/29/09 and showed normal perfusion and normal LV function with an ejection fraction of 64%. An echocardiogram was performed on May 29, 2009 and showed normal LV function. Renal Dopplers were technically difficult. There was no stenosis on the left and the right was not well visualized. FU CTA to rule out renal artery stenosis was negative. Followup echocardiogram at [facility] in February of 2012 showed normal LV function and grade 1 diastolic dysfunction. Recently seen by Tereso Newcomer for preoperative clearance. Repeat Myoview in November of 2012 revealed breast attenuation but no ischemia. Ejection fraction 71%. Note previous CT in January of 2012 showed small modules and followup recommended.  Current Outpatient Prescriptions  Medication Sig Dispense Refill  . aspirin (ADULT ASPIRIN EC LOW STRENGTH) 81 MG EC tablet Take 81 mg by mouth daily.        . carvedilol (COREG) 25 MG tablet Take 25 mg by mouth 2 (two) times daily.       . clonazePAM (KLONOPIN) 1 MG tablet Take 1 mg by mouth 3 (three) times daily.       . cloNIDine (CATAPRES) 0.2 MG tablet Take 0.2 mg by mouth 2 (two) times daily.       . Cyanocobalamin (VITAMIN B-12 PO) Take 1 tablet by mouth daily.        . DULoxetine (CYMBALTA) 60 MG capsule Take 60 mg by mouth every evening.       . enalapril (VASOTEC) 20 MG tablet Take 20 mg by mouth 2 (two) times daily.       [name] esomeprazole (NEXIUM) 40 MG capsule Take 40 mg by mouth 2 (two) times daily as needed. 1 capsule daily scheduled, will take an additional capsule if needed      . furosemide (LASIX) 20 MG tablet Take 20 mg by mouth.       . hydrALAZINE (APRESOLINE) 50 MG tablet Take 75 mg by mouth 3 (three) times daily.       [name] HYDROmorphone (DILAUDID) 2 MG tablet Take 2 mg by mouth every 4 (four) hours as needed. For pain      . L-Methylfolate (DEPLIN) 7.5 MG TABS  Take 1 tablet by mouth every evening.       . lamoTRIgine (LAMICTAL) 25 MG tablet Take 25 mg by mouth every evening.       . Multiple Vitamins-Minerals (MULTIVITAMINS THER. W/MINERALS) TABS Take 1 tablet by mouth daily.        [name] spironolactone (ALDACTONE) 25 MG tablet Take 25 mg by mouth every morning.          Past Medical History  Diagnosis Date  . HLD (hyperlipidemia)     nec/nos  . HTN (hypertension)     benign essential  . Narcolepsy   . Glucose intolerance (impaired glucose tolerance)   . Fatigue   . Chronic diastolic heart failure     hospitalized at [facility]  . Bipolar disorder   . Suicide attempt     hx  . Syncope     admx to [facility] in 2/12: Echo with normal LVF; head CT unremarkable, ECG stable, no further w/u  . Complication of anesthesia     had "breathing problems and ended up in ICU" 03/2010 report on chart  . CHF (congestive heart failure)   . MVP (mitral valve prolapse)   . DDD (degenerative disc disease), lumbar   .  GERD (gastroesophageal reflux disease)   . Ulcer   . Diabetes in pregnancy     diet cnontrolled  . Bipolar 1 disorder     Past Surgical History  Procedure Date  . Partial hysterectomy   . Bladder suspension   . Gastric bypass   . Repair peroneal tendons ankle   . Lumbar laminectomy/decompression microdiscectomy 09/11/2011    Procedure: LUMBAR LAMINECTOMY/DECOMPRESSION MICRODISCECTOMY;  Surgeon: Jacki Cones;  Location: [facility] ORS;  Service: Orthopedics;  Laterality: Right;  Hemi Laminectomy/Microdiscectomy L5 - S1 on the Right (X-Ray)    History   Social History  . Marital Status: Married    Spouse Name: N/A    Number of Children: N/A  . Years of Education: N/A   Occupational History  . Not on file.   Social History Main Topics  . Smoking status: Former Smoker    Quit date: 10/08/2003  . Smokeless tobacco: Not on file  . Alcohol Use: Yes     rare  . Drug Use: No  . Sexually Active:    Other Topics Concern  . Not  on file   Social History Narrative   Regularly exercises.     ROS: no fevers or chills, productive cough, hemoptysis, dysphasia, odynophagia, melena, hematochezia, dysuria, hematuria, rash, seizure activity, orthopnea, PND, pedal edema, claudication. Remaining systems are negative.  Physical Exam: Well-developed well-nourished in no acute distress.  Skin is warm and dry.  HEENT is normal.  Neck is supple. No thyromegaly.  Chest is clear to auscultation with normal expansion.  Cardiovascular exam is regular rate and rhythm.  Abdominal exam nontender or distended. No masses palpated. Extremities show no edema. neuro grossly intact  ECG     This encounter was created in error - please disregard.

## 2011-11-21 DIAGNOSIS — F314 Bipolar disorder, current episode depressed, severe, without psychotic features: Secondary | ICD-10-CM | POA: Diagnosis not present

## 2012-03-05 DIAGNOSIS — M169 Osteoarthritis of hip, unspecified: Secondary | ICD-10-CM | POA: Diagnosis not present

## 2012-03-05 DIAGNOSIS — M1610 Unilateral primary osteoarthritis, unspecified hip: Secondary | ICD-10-CM | POA: Diagnosis not present

## 2012-03-09 ENCOUNTER — Other Ambulatory Visit: Payer: Self-pay | Admitting: Cardiovascular Disease

## 2012-03-10 DIAGNOSIS — M1610 Unilateral primary osteoarthritis, unspecified hip: Secondary | ICD-10-CM | POA: Diagnosis not present

## 2012-03-10 DIAGNOSIS — M169 Osteoarthritis of hip, unspecified: Secondary | ICD-10-CM | POA: Diagnosis not present

## 2012-03-13 DIAGNOSIS — M5126 Other intervertebral disc displacement, lumbar region: Secondary | ICD-10-CM | POA: Diagnosis not present

## 2012-03-13 DIAGNOSIS — M549 Dorsalgia, unspecified: Secondary | ICD-10-CM | POA: Diagnosis not present

## 2012-03-16 DIAGNOSIS — F314 Bipolar disorder, current episode depressed, severe, without psychotic features: Secondary | ICD-10-CM | POA: Diagnosis not present

## 2012-03-19 DIAGNOSIS — M5126 Other intervertebral disc displacement, lumbar region: Secondary | ICD-10-CM | POA: Diagnosis not present

## 2012-03-19 DIAGNOSIS — M549 Dorsalgia, unspecified: Secondary | ICD-10-CM | POA: Diagnosis not present

## 2012-04-13 DIAGNOSIS — M5126 Other intervertebral disc displacement, lumbar region: Secondary | ICD-10-CM | POA: Diagnosis not present

## 2012-06-02 DIAGNOSIS — F314 Bipolar disorder, current episode depressed, severe, without psychotic features: Secondary | ICD-10-CM | POA: Diagnosis not present

## 2012-06-05 ENCOUNTER — Encounter: Payer: Federal, State, Local not specified - PPO | Admitting: Family Medicine

## 2012-06-17 ENCOUNTER — Encounter (INDEPENDENT_AMBULATORY_CARE_PROVIDER_SITE_OTHER): Payer: Federal, State, Local not specified - PPO | Admitting: Cardiology

## 2012-06-17 ENCOUNTER — Encounter: Payer: Self-pay | Admitting: Cardiology

## 2012-06-17 ENCOUNTER — Ambulatory Visit (INDEPENDENT_AMBULATORY_CARE_PROVIDER_SITE_OTHER): Payer: Federal, State, Local not specified - PPO | Admitting: Cardiology

## 2012-06-17 VITALS — BP 171/100 | HR 62 | Ht 65.0 in | Wt 168.0 lb

## 2012-06-17 DIAGNOSIS — I10 Essential (primary) hypertension: Secondary | ICD-10-CM | POA: Diagnosis not present

## 2012-06-17 DIAGNOSIS — R0989 Other specified symptoms and signs involving the circulatory and respiratory systems: Secondary | ICD-10-CM

## 2012-06-17 MED ORDER — AMLODIPINE BESYLATE 10 MG PO TABS: 10.0000 mg | ORAL_TABLET | Freq: Every day | ORAL | Status: DC

## 2012-06-17 NOTE — Patient Instructions (Addendum)
Your physician wants you to follow-up in: 6 MONTHS WITH DR Jens Som [place]  You will receive a reminder letter in the mail two months in advance. If you don't receive a letter, please call our office to schedule the follow-up appointment.   Your physician recommends that you HAVE LAB WORK TODAY  START AMLODIPINE 10 MG ONCE DAILY

## 2012-06-17 NOTE — Progress Notes (Signed)
HPI:  Pleasant female for fu of hypertension and chest pain. Previous renal Dopplers were technically difficult. There was no stenosis on the left and the right was not well visualized. Follow up CTA to rule out renal artery stenosis was done and was negative. Echocardiogram in February of 2012 showed normal LV function. Last Myoview in November 2012 showed an ejection fraction of 71%, breast attenuation but no ischemia. Since I last saw her she denies significant dyspnea, chest pain, palpitations or syncope. She states her blood pressure is running approximately 170/100.  Current Outpatient Prescriptions  Medication Sig Dispense Refill  . aspirin (ADULT ASPIRIN EC LOW STRENGTH) 81 MG EC tablet Take 81 mg by mouth daily.        . carvedilol (COREG) 25 MG tablet Take 25 mg by mouth 2 (two) times daily.       . clonazePAM (KLONOPIN) 1 MG tablet Take 1 mg by mouth 3 (three) times daily.       . cloNIDine (CATAPRES) 0.2 MG tablet TAKE TWO TABLETS BY MOUTH THREE TIMES DAILY  90 tablet  1  . Cyanocobalamin (VITAMIN B-12 PO) Take 1 tablet by mouth daily.        . DULoxetine (CYMBALTA) 60 MG capsule Take 60 mg by mouth every evening.       . enalapril (VASOTEC) 20 MG tablet Take 20 mg by mouth 2 (two) times daily.       [name] esomeprazole (NEXIUM) 40 MG capsule Take 40 mg by mouth 2 (two) times daily as needed. 1 capsule daily scheduled, will take an additional capsule if needed      . furosemide (LASIX) 20 MG tablet Take 20 mg by mouth.       . hydrALAZINE (APRESOLINE) 50 MG tablet Take 75 mg by mouth 3 (three) times daily.       [name] HYDROmorphone (DILAUDID) 2 MG tablet Take 2 mg by mouth every 4 (four) hours as needed. For pain      . L-Methylfolate (DEPLIN) 7.5 MG TABS Take 1 tablet by mouth every evening.       . lamoTRIgine (LAMICTAL) 25 MG tablet Take 25 mg by mouth every evening.       . Multiple Vitamins-Minerals (MULTIVITAMINS THER. W/MINERALS) TABS Take 1 tablet by mouth daily.        [name]  spironolactone (ALDACTONE) 25 MG tablet Take 25 mg by mouth every morning.       . VENTOLIN HFA 108 (90 BASE) MCG/ACT inhaler prn         Past Medical History  Diagnosis Date  . HLD (hyperlipidemia)     nec/nos  . HTN (hypertension)     benign essential  . Narcolepsy   . Glucose intolerance (impaired glucose tolerance)   . Chronic diastolic heart failure     hospitalized at [facility]  . Bipolar disorder   . Suicide attempt     hx  . Syncope     admx to [facility] in 2/12: Echo with normal LVF; head CT unremarkable, ECG stable, no further w/u  . Complication of anesthesia     had "breathing problems and ended up in ICU" 03/2010 report on chart  . CHF (congestive heart failure)   . MVP (mitral valve prolapse)   . DDD (degenerative disc disease), lumbar   . GERD (gastroesophageal reflux disease)   . Ulcer   . Diabetes in pregnancy     diet cnontrolled    Past Surgical History  Procedure Date  . Partial hysterectomy   . Bladder suspension   . Gastric bypass   . Repair peroneal tendons ankle   . Lumbar laminectomy/decompression microdiscectomy 09/11/2011    Procedure: LUMBAR LAMINECTOMY/DECOMPRESSION MICRODISCECTOMY;  Surgeon: Jacki Cones;  Location: [facility];  Service: Orthopedics;  Laterality: Right;  Hemi Laminectomy/Microdiscectomy L5 - S1 on the Right (X-Ray)    History   Social History  . Marital Status: Married    Spouse Name: N/A    Number of Children: N/A  . Years of Education: N/A   Occupational History  . Not on file.   Social History Main Topics  . Smoking status: Former Smoker    Quit date: 10/08/2003  . Smokeless tobacco: Not on file  . Alcohol Use: Yes     rare  . Drug Use: No  . Sexually Active:    Other Topics Concern  . Not on file   Social History Narrative   Regularly exercises.     ROS: no fevers or chills, productive cough, hemoptysis, dysphasia, odynophagia, melena, hematochezia, dysuria, hematuria, rash, seizure activity,  orthopnea, PND, pedal edema, claudication. Remaining systems are negative.  Physical Exam: Well-developed well-nourished in no acute distress.  Skin is warm and dry.  HEENT is normal.  Neck is supple.  Chest is clear to auscultation with normal expansion.  Cardiovascular exam is regular rate and rhythm.  Abdominal exam nontender or distended. No masses palpated. Extremities show no edema. neuro grossly intact  ECG sinus rhythm at a rate of 62. Nonspecific T-wave changes.

## 2012-06-17 NOTE — Progress Notes (Addendum)
HPI: Pleasant female for fu of hypertension and chest pain. Previous renal Dopplers were technically difficult. There was no stenosis on the left and the right was not well visualized. Follow up CTA to rule out renal artery stenosis was done and was negative. Echocardiogram in February of 2012 showed normal LV function. Last Myoview in November 2012 showed an ejection fraction of 71%, breast attenuation but no ischemia.    Current Outpatient Prescriptions  Medication Sig Dispense Refill  . amoxicillin-clavulanate (AUGMENTIN) 875-125 MG per tablet As directed      . aspirin (ADULT ASPIRIN EC LOW STRENGTH) 81 MG EC tablet Take 81 mg by mouth daily.        . carvedilol (COREG) 25 MG tablet Take 25 mg by mouth 2 (two) times daily.       . clonazePAM (KLONOPIN) 1 MG tablet Take 1 mg by mouth 3 (three) times daily.       . cloNIDine (CATAPRES) 0.2 MG tablet TAKE TWO TABLETS BY MOUTH THREE TIMES DAILY  90 tablet  1  . Cyanocobalamin (VITAMIN B-12 PO) Take 1 tablet by mouth daily.        . DULoxetine (CYMBALTA) 60 MG capsule Take 60 mg by mouth every evening.       . enalapril (VASOTEC) 20 MG tablet Take 20 mg by mouth 2 (two) times daily.       [name] esomeprazole (NEXIUM) 40 MG capsule Take 40 mg by mouth 2 (two) times daily as needed. 1 capsule daily scheduled, will take an additional capsule if needed      . furosemide (LASIX) 20 MG tablet Take 20 mg by mouth.       . hydrALAZINE (APRESOLINE) 50 MG tablet Take 75 mg by mouth 3 (three) times daily.       [name] HYDROmorphone (DILAUDID) 2 MG tablet Take 2 mg by mouth every 4 (four) hours as needed. For pain      . L-Methylfolate (DEPLIN) 7.5 MG TABS Take 1 tablet by mouth every evening.       . lamoTRIgine (LAMICTAL) 25 MG tablet Take 25 mg by mouth every evening.       . Multiple Vitamins-Minerals (MULTIVITAMINS THER. W/MINERALS) TABS Take 1 tablet by mouth daily.        [name] spironolactone (ALDACTONE) 25 MG tablet Take 25 mg by mouth every morning.       .  VENTOLIN HFA 108 (90 BASE) MCG/ACT inhaler prn         Past Medical History  Diagnosis Date  . HLD (hyperlipidemia)     nec/nos  . HTN (hypertension)     benign essential  . Narcolepsy   . Glucose intolerance (impaired glucose tolerance)   . Chronic diastolic heart failure     hospitalized at [facility]  . Bipolar disorder   . Suicide attempt     hx  . Syncope     admx to [facility] in 2/12: Echo with normal LVF; head CT unremarkable, ECG stable, no further w/u  . Complication of anesthesia     had "breathing problems and ended up in ICU" 03/2010 report on chart  . CHF (congestive heart failure)   . MVP (mitral valve prolapse)   . DDD (degenerative disc disease), lumbar   . GERD (gastroesophageal reflux disease)   . Ulcer   . Diabetes in pregnancy     diet cnontrolled    Past Surgical History  Procedure Date  . Partial hysterectomy   .  Bladder suspension   . Gastric bypass   . Repair peroneal tendons ankle   . Lumbar laminectomy/decompression microdiscectomy 09/11/2011    Procedure: LUMBAR LAMINECTOMY/DECOMPRESSION MICRODISCECTOMY;  Surgeon: Jacki Cones;  Location: [facility];  Service: Orthopedics;  Laterality: Right;  Hemi Laminectomy/Microdiscectomy L5 - S1 on the Right (X-Ray)    History   Social History  . Marital Status: Married    Spouse Name: N/A    Number of Children: N/A  . Years of Education: N/A   Occupational History  . Not on file.   Social History Main Topics  . Smoking status: Former Smoker    Quit date: 10/08/2003  . Smokeless tobacco: Not on file  . Alcohol Use: Yes     rare  . Drug Use: No  . Sexually Active:    Other Topics Concern  . Not on file   Social History Narrative   Regularly exercises.     ROS: no fevers or chills, productive cough, hemoptysis, dysphasia, odynophagia, melena, hematochezia, dysuria, hematuria, rash, seizure activity, orthopnea, PND, pedal edema, claudication. Remaining systems are  negative.  Physical Exam: Well-developed well-nourished in no acute distress.  Skin is warm and dry.  HEENT is normal.  Neck is supple. No thyromegaly.  Chest is clear to auscultation with normal expansion.  Cardiovascular exam is regular rate and rhythm.  Abdominal exam nontender or distended. No masses palpated. Extremities show no edema. neuro grossly intact  ECG     This encounter was created in error - please disregard.

## 2012-06-17 NOTE — Assessment & Plan Note (Signed)
Symptoms have improved. LV function normal. Euvolemic on examination.

## 2012-06-17 NOTE — Assessment & Plan Note (Signed)
Management per primary care. 

## 2012-06-17 NOTE — Assessment & Plan Note (Signed)
Blood pressure remains elevated. Continue present medications and add Norvasc 10 mg daily. Continue to adjust as needed. Check potassium and renal function.

## 2012-06-18 LAB — BASIC METABOLIC PANEL WITH GFR
GFR, Est African American: 83 mL/min
GFR, Est Non African American: 72 mL/min
Potassium: 3.9 mEq/L (ref 3.5–5.3)
Sodium: 141 mEq/L (ref 135–145)

## 2012-06-23 NOTE — Addendum Note (Signed)
Addended by: Kem Parkinson on: 06/23/2012 11:24 AM   Modules accepted: Orders

## 2012-06-30 ENCOUNTER — Encounter: Payer: Self-pay | Admitting: *Deleted

## 2012-08-11 ENCOUNTER — Other Ambulatory Visit: Payer: Self-pay | Admitting: Cardiovascular Disease

## 2012-08-12 ENCOUNTER — Other Ambulatory Visit: Payer: Self-pay | Admitting: Cardiovascular Disease

## 2012-08-12 NOTE — Telephone Encounter (Signed)
Fax Received. Refill Completed. [name] (R.M.A)   

## 2012-10-07 HISTORY — PX: GASTRIC BYPASS: SHX52

## 2012-11-15 ENCOUNTER — Other Ambulatory Visit: Payer: Self-pay | Admitting: Cardiovascular Disease

## 2012-11-17 DIAGNOSIS — M169 Osteoarthritis of hip, unspecified: Secondary | ICD-10-CM | POA: Diagnosis not present

## 2012-11-17 DIAGNOSIS — M5126 Other intervertebral disc displacement, lumbar region: Secondary | ICD-10-CM | POA: Diagnosis not present

## 2012-11-17 DIAGNOSIS — M1610 Unilateral primary osteoarthritis, unspecified hip: Secondary | ICD-10-CM | POA: Diagnosis not present

## 2012-11-23 DIAGNOSIS — M169 Osteoarthritis of hip, unspecified: Secondary | ICD-10-CM | POA: Diagnosis not present

## 2012-11-23 DIAGNOSIS — M1610 Unilateral primary osteoarthritis, unspecified hip: Secondary | ICD-10-CM | POA: Diagnosis not present

## 2012-11-25 DIAGNOSIS — F314 Bipolar disorder, current episode depressed, severe, without psychotic features: Secondary | ICD-10-CM | POA: Diagnosis not present

## 2012-11-27 ENCOUNTER — Other Ambulatory Visit: Payer: Self-pay | Admitting: Family Medicine

## 2012-11-27 ENCOUNTER — Other Ambulatory Visit: Payer: Self-pay | Admitting: Cardiovascular Disease

## 2012-11-27 DIAGNOSIS — Z1231 Encounter for screening mammogram for malignant neoplasm of breast: Secondary | ICD-10-CM | POA: Diagnosis not present

## 2012-11-27 DIAGNOSIS — Z01419 Encounter for gynecological examination (general) (routine) without abnormal findings: Secondary | ICD-10-CM | POA: Diagnosis not present

## 2012-12-04 DIAGNOSIS — M25559 Pain in unspecified hip: Secondary | ICD-10-CM | POA: Diagnosis not present

## 2012-12-04 DIAGNOSIS — I10 Essential (primary) hypertension: Secondary | ICD-10-CM | POA: Diagnosis not present

## 2012-12-04 DIAGNOSIS — M545 Low back pain, unspecified: Secondary | ICD-10-CM | POA: Diagnosis not present

## 2012-12-16 DIAGNOSIS — M545 Low back pain, unspecified: Secondary | ICD-10-CM | POA: Diagnosis not present

## 2012-12-17 DIAGNOSIS — M545 Low back pain, unspecified: Secondary | ICD-10-CM | POA: Diagnosis not present

## 2012-12-29 DIAGNOSIS — F314 Bipolar disorder, current episode depressed, severe, without psychotic features: Secondary | ICD-10-CM | POA: Diagnosis not present

## 2012-12-31 DIAGNOSIS — M961 Postlaminectomy syndrome, not elsewhere classified: Secondary | ICD-10-CM | POA: Diagnosis not present

## 2012-12-31 DIAGNOSIS — M5126 Other intervertebral disc displacement, lumbar region: Secondary | ICD-10-CM | POA: Diagnosis not present

## 2013-01-05 ENCOUNTER — Other Ambulatory Visit: Payer: Self-pay | Admitting: Cardiovascular Disease

## 2013-01-26 DIAGNOSIS — M48061 Spinal stenosis, lumbar region without neurogenic claudication: Secondary | ICD-10-CM | POA: Diagnosis not present

## 2013-02-02 ENCOUNTER — Other Ambulatory Visit: Payer: Self-pay | Admitting: Cardiology

## 2013-02-08 DIAGNOSIS — M961 Postlaminectomy syndrome, not elsewhere classified: Secondary | ICD-10-CM | POA: Diagnosis not present

## 2013-02-08 DIAGNOSIS — M48061 Spinal stenosis, lumbar region without neurogenic claudication: Secondary | ICD-10-CM | POA: Diagnosis not present

## 2013-02-22 DIAGNOSIS — M25559 Pain in unspecified hip: Secondary | ICD-10-CM | POA: Diagnosis not present

## 2013-02-22 DIAGNOSIS — M5126 Other intervertebral disc displacement, lumbar region: Secondary | ICD-10-CM | POA: Diagnosis not present

## 2013-03-02 DIAGNOSIS — M25559 Pain in unspecified hip: Secondary | ICD-10-CM | POA: Diagnosis not present

## 2013-03-13 DIAGNOSIS — F5105 Insomnia due to other mental disorder: Secondary | ICD-10-CM | POA: Insufficient documentation

## 2013-03-13 DIAGNOSIS — K219 Gastro-esophageal reflux disease without esophagitis: Secondary | ICD-10-CM | POA: Insufficient documentation

## 2013-03-13 DIAGNOSIS — E119 Type 2 diabetes mellitus without complications: Secondary | ICD-10-CM

## 2013-03-13 DIAGNOSIS — I2510 Atherosclerotic heart disease of native coronary artery without angina pectoris: Secondary | ICD-10-CM

## 2013-03-13 DIAGNOSIS — G47419 Narcolepsy without cataplexy: Secondary | ICD-10-CM | POA: Insufficient documentation

## 2013-03-13 HISTORY — DX: Atherosclerotic heart disease of native coronary artery without angina pectoris: I25.10

## 2013-03-13 HISTORY — DX: Type 2 diabetes mellitus without complications: E11.9

## 2013-03-15 ENCOUNTER — Other Ambulatory Visit: Payer: Self-pay | Admitting: Cardiology

## 2013-03-19 ENCOUNTER — Encounter: Payer: Self-pay | Admitting: Physician Assistant

## 2013-03-19 ENCOUNTER — Ambulatory Visit (INDEPENDENT_AMBULATORY_CARE_PROVIDER_SITE_OTHER): Payer: Federal, State, Local not specified - PPO | Admitting: Physician Assistant

## 2013-03-19 VITALS — BP 211/124 | HR 91 | Wt 170.0 lb

## 2013-03-19 DIAGNOSIS — I10 Essential (primary) hypertension: Secondary | ICD-10-CM

## 2013-03-19 MED ORDER — HYDRALAZINE HCL 50 MG PO TABS: 50.0000 mg | ORAL_TABLET | Freq: Four times a day (QID) | ORAL | Status: DC

## 2013-03-19 MED ORDER — SPIRONOLACTONE 50 MG PO TABS: 50.0000 mg | ORAL_TABLET | Freq: Every day | ORAL | Status: DC

## 2013-03-19 NOTE — Patient Instructions (Addendum)
Increase spironactlone to 50mg  and hydralazine to four times a day. Just for today 2 tabs of lasix but then go back to 20mg  once a day.   1.5 Gram Low Sodium Diet A 1.5 gram sodium diet restricts the amount of sodium in the diet to no more than 1.5 g or 1500 mg daily. The American Heart Association recommends Americans over the age of 62 to consume no more than 1500 mg of sodium each day to reduce the risk of developing high blood pressure. Research also shows that limiting sodium may reduce heart attack and stroke risk. Many foods contain sodium for flavor and sometimes as a preservative. When the amount of sodium in a diet needs to be low, it is important to know what to look for when choosing foods and drinks. The following includes some information and guidelines to help make it easier for you to adapt to a low sodium diet. QUICK TIPS  Do not add salt to food.  Avoid convenience items and fast food.  Choose unsalted snack foods.  Buy lower sodium products, often labeled as "lower sodium" or "no salt added."  Check food labels to learn how much sodium is in 1 serving.  When eating at a restaurant, ask that your food be prepared with less salt or none, if possible. READING FOOD LABELS FOR SODIUM INFORMATION The nutrition facts label is a good place to find how much sodium is in foods. Look for products with no more than 400 mg of sodium per serving. Remember that 1.5 g = 1500 mg. The food label may also list foods as:  Sodium-free: Less than 5 mg in a serving.  Very low sodium: 35 mg or less in a serving.  Low-sodium: 140 mg or less in a serving.  Light in sodium: 50% less sodium in a serving. For example, if a food that usually has 300 mg of sodium is changed to become light in sodium, it will have 150 mg of sodium.  Reduced sodium: 25% less sodium in a serving. For example, if a food that usually has 400 mg of sodium is changed to reduced sodium, it will have 300 mg of  sodium. CHOOSING FOODS Grains  Avoid: Salted crackers and snack items. Some cereals, including instant hot cereals. Bread stuffing and biscuit mixes. Seasoned rice or pasta mixes.  Choose: Unsalted snack items. Low-sodium cereals, oats, puffed wheat and rice, shredded wheat. English muffins and bread. Pasta. Meats  Avoid: Salted, canned, smoked, spiced, pickled meats, including fish and poultry. Bacon, ham, sausage, cold cuts, hot dogs, anchovies.  Choose: Low-sodium canned tuna and salmon. Fresh or frozen meat, poultry, and fish. Dairy  Avoid: Processed cheese and spreads. Cottage cheese. Buttermilk and condensed milk. Regular cheese.  Choose: Milk. Low-sodium cottage cheese. Yogurt. Sour cream. Low-sodium cheese. Fruits and Vegetables  Avoid: Regular canned vegetables. Regular canned tomato sauce and paste. Frozen vegetables in sauces. Olives. Rosita Fire. Relishes. Sauerkraut.  Choose: Low-sodium canned vegetables. Low-sodium tomato sauce and paste. Frozen or fresh vegetables. Fresh and frozen fruit. Condiments  Avoid: Canned and packaged gravies. Worcestershire sauce. Tartar sauce. Barbecue sauce. Soy sauce. Steak sauce. Ketchup. Onion, garlic, and table salt. Meat flavorings and tenderizers.  Choose: Fresh and dried herbs and spices. Low-sodium varieties of mustard and ketchup. Lemon juice. Tabasco sauce. Horseradish. SAMPLE 1.5 GRAM SODIUM MEAL PLAN Breakfast / Sodium (mg)  1 cup low-fat milk / 143 mg  1 whole-wheat English muffin / 240 mg  1 tbs heart-healthy margarine / 153  mg  1 hard-boiled egg / 139 mg  1 small orange / 0 mg Lunch / Sodium (mg)  1 cup raw carrots / 76 mg  2 tbs no salt added peanut butter / 5 mg  2 slices whole-wheat bread / 270 mg  1 tbs jelly / 6 mg   cup red grapes / 2 mg Dinner / Sodium (mg)  1 cup whole-wheat pasta / 2 mg  1 cup low-sodium tomato sauce / 73 mg  3 oz lean ground beef / 57 mg  1 small side salad (1 cup raw spinach  leaves,  cup cucumber,  cup yellow bell pepper) with 1 tsp olive oil and 1 tsp red wine vinegar / 25 mg Snack / Sodium (mg)  1 container low-fat vanilla yogurt / 107 mg  3 graham cracker squares / 127 mg Nutrient Analysis  Calories: 1745  Protein: 75 g  Carbohydrate: 237 g  Fat: 57 g  Sodium: 1425 mg Document Released: 09/23/2005 Document Revised: 12/16/2011 Document Reviewed: 12/25/2009 Exit[employer] Patient Information 2014 [place], [place].

## 2013-03-21 NOTE — Progress Notes (Addendum)
  Subjective:    Patient ID: [name], female    DOB: 09-11-1956, 57 y.o.   MRN: [id number]  HPI Patient is a 57 yo female of Dr. Linford Arnold who presents with elevated blood pressure. She has history of uncontrolled Blood Pressure. It has gotten progressively worse and over the past 1 month she has had lots of headaches, dizziness and feels worn out. She does not check her blood pressure at home but she states she has known it was high. She does not keep to a strict salt diet. She has been tested for sleep apnea and was negative. She sees Dr. Jens Som, cardiologist, but last appt was 9/13. She has appt with Cardiologist in August. She denies any chest pain or palpation. She reports she is taking all of her medications as directed.    Review of Systems     Objective:   Physical Exam  Constitutional: She is oriented to person, place, and time. She appears well-developed and well-nourished.  HENT:  Head: Normocephalic and atraumatic.  Cardiovascular: Normal rate, regular rhythm and normal heart sounds.   Pulmonary/Chest: Effort normal and breath sounds normal.  Neurological: She is alert and oriented to person, place, and time.  Skin: Skin is warm and dry.  Psychiatric: She has a normal mood and affect. Her behavior is normal.          Assessment & Plan:  HTN, uncontrolled- increased spironactlone back up to 50mg  daily, increased hydraline to 50mg  QID. Discussed low salt diet and keeping under 1500mg  daily. Follow up in 1 week. If worsening CP or new symptoms go to ER. Discussed with pt to Call cardiologist and get appt moved up as soon as she could.

## 2013-03-26 ENCOUNTER — Encounter: Payer: Self-pay | Admitting: Physician Assistant

## 2013-03-26 ENCOUNTER — Ambulatory Visit (INDEPENDENT_AMBULATORY_CARE_PROVIDER_SITE_OTHER): Payer: Federal, State, Local not specified - PPO | Admitting: Physician Assistant

## 2013-03-26 ENCOUNTER — Ambulatory Visit (INDEPENDENT_AMBULATORY_CARE_PROVIDER_SITE_OTHER): Payer: Federal, State, Local not specified - PPO

## 2013-03-26 VITALS — BP 185/106 | HR 68 | Wt 175.0 lb

## 2013-03-26 DIAGNOSIS — I10 Essential (primary) hypertension: Secondary | ICD-10-CM | POA: Diagnosis not present

## 2013-03-26 DIAGNOSIS — R8290 Unspecified abnormal findings in urine: Secondary | ICD-10-CM

## 2013-03-26 DIAGNOSIS — R635 Abnormal weight gain: Secondary | ICD-10-CM

## 2013-03-26 DIAGNOSIS — R0602 Shortness of breath: Secondary | ICD-10-CM | POA: Diagnosis not present

## 2013-03-26 DIAGNOSIS — R82998 Other abnormal findings in urine: Secondary | ICD-10-CM | POA: Diagnosis not present

## 2013-03-26 LAB — POCT URINALYSIS DIPSTICK
Bilirubin, UA: NEGATIVE
Blood, UA: NEGATIVE
Glucose, UA: NEGATIVE
Spec Grav, UA: 1.025

## 2013-03-26 NOTE — Patient Instructions (Addendum)
Increase lasix to 40 mg. Follow up in one week.

## 2013-03-26 NOTE — Progress Notes (Signed)
  Subjective:    Patient ID: [name], female    DOB: 01-04-1956, 57 y.o.   MRN: [id number]  HPI Patient comes into to recheck her blood pressure after 1 week. BP still up after increase hydralazine and spironactlone and keeping a low salt diet. She denies any CP or palipitations. She is some short of breath but worse when she is outside in heat. Denies sleeping on more pillows. She denies any edema in extremities. She has gained 5lbs in one week. Not had echo since 2012 but was 60 percent.     Pt has had some urine odor and some low back pain that is off and on and dull. She has no urinary symptoms of frequency, urgency, dysuria. No fever, chills, n/v/d, or abdominal pain.      Review of Systems     Objective:   Physical Exam  Constitutional: She is oriented to person, place, and time. She appears well-developed and well-nourished.  HENT:  Head: Normocephalic and atraumatic.  Neck:  NO JVD distention.   Cardiovascular: Normal rate, regular rhythm and normal heart sounds.   Pulmonary/Chest: Effort normal and breath sounds normal. She has no wheezes. She has no rales.  No crackles. No CVA tenderness.  Neurological: She is alert and oriented to person, place, and time.  Skin: Skin is warm and dry.  NO ankle edema.   Psychiatric: She has a normal mood and affect. Her behavior is normal.          Assessment & Plan:  HTN/SOB/weight gain- BP still remains very elevated despite increase in meds. Pt is not having typical symptoms of CHF and edema but does have a 5lb weight gain and feeling SOB. Will increase lasix to 40mg  daily. Will also get chest xray. Reminded to keep salt to 1500mg  daily. Will check BMP. Follow up in 1 week. Keep appt with Dr. Jens Som. Last echo done 2012 suspect echo should be ordered.   Abnormal urine odor- UA was negative for leuks, nitrates, blood but was cloudy. Will send for culture.

## 2013-03-27 LAB — BASIC METABOLIC PANEL WITH GFR
Calcium: 9.1 mg/dL (ref 8.4–10.5)
Chloride: 104 mEq/L (ref 96–112)
Creat: 0.89 mg/dL (ref 0.50–1.10)
GFR, Est African American: 84 mL/min
GFR, Est Non African American: 73 mL/min

## 2013-04-06 DIAGNOSIS — F314 Bipolar disorder, current episode depressed, severe, without psychotic features: Secondary | ICD-10-CM | POA: Diagnosis not present

## 2013-04-30 ENCOUNTER — Other Ambulatory Visit: Payer: Self-pay | Admitting: Cardiology

## 2013-05-26 ENCOUNTER — Encounter: Payer: Federal, State, Local not specified - PPO | Admitting: Cardiology

## 2013-05-26 NOTE — Progress Notes (Signed)
HPI: Pleasant female for fu of hypertension. Previous renal Dopplers were technically difficult. There was no stenosis on the left and the right was not well visualized. Follow up CTA to rule out renal artery stenosis was done and was negative. Echocardiogram in February of 2012 showed normal LV function. Last Myoview in November 2012 showed an ejection fraction of 71%, breast attenuation but no ischemia. Since I last saw her in Sept 2013    Current Outpatient Prescriptions  Medication Sig Dispense Refill  . amLODipine (NORVASC) 10 MG tablet Take 1 tablet (10 mg total) by mouth daily.  180 tablet  3  . aspirin (ADULT ASPIRIN EC LOW STRENGTH) 81 MG EC tablet Take 81 mg by mouth daily.        . carvedilol (COREG) 25 MG tablet TAKE ONE TABLET BY MOUTH TWICE DAILY  60 tablet  5  . clonazePAM (KLONOPIN) 1 MG tablet Take 1 mg by mouth 3 (three) times daily.       . cloNIDine (CATAPRES) 0.2 MG tablet TAKE TWO TABLETS BY MOUTH THREE TIMES DAILY  90 tablet  0  . Cyanocobalamin (VITAMIN B-12 PO) Take 1 tablet by mouth daily.        . DULoxetine (CYMBALTA) 60 MG capsule Take 60 mg by mouth every evening.       . enalapril (VASOTEC) 20 MG tablet TAKE ONE TABLET BY MOUTH TWICE DAILY  60 tablet  3  . esomeprazole (NEXIUM) 40 MG capsule Take 40 mg by mouth 2 (two) times daily as needed. 1 capsule daily scheduled, will take an additional capsule if needed      . furosemide (LASIX) 20 MG tablet Take 20 mg by mouth.       . hydrALAZINE (APRESOLINE) 50 MG tablet Take 1 tablet (50 mg total) by mouth 4 (four) times daily.  120 tablet  1  . HYDROmorphone (DILAUDID) 2 MG tablet Take 2 mg by mouth every 4 (four) hours as needed. For pain      . L-Methylfolate (DEPLIN) 7.5 MG TABS Take 1 tablet by mouth every evening.       . lamoTRIgine (LAMICTAL) 25 MG tablet Take 25 mg by mouth every evening.       . Multiple Vitamins-Minerals (MULTIVITAMINS THER. W/MINERALS) TABS Take 1 tablet by mouth daily.        [name]  spironolactone (ALDACTONE) 50 MG tablet Take 1 tablet (50 mg total) by mouth daily.  30 tablet  1  . VENTOLIN HFA 108 (90 BASE) MCG/ACT inhaler prn       No current facility-administered medications for this visit.     Past Medical History  Diagnosis Date  . HLD (hyperlipidemia)     nec/nos  . HTN (hypertension)     benign essential  . Narcolepsy   . Glucose intolerance (impaired glucose tolerance)   . Chronic diastolic heart failure     hospitalized at [facility]  . Bipolar disorder   . Suicide attempt     hx  . Syncope     admx to [facility] in 2/12: Echo with normal LVF; head CT unremarkable, ECG stable, no further w/u  . Complication of anesthesia     had "breathing problems and ended up in ICU" 03/2010 report on chart  . CHF (congestive heart failure)   . MVP (mitral valve prolapse)   . DDD (degenerative disc disease), lumbar   . GERD (gastroesophageal reflux disease)   . Ulcer   . 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 Partial hysterectomy    . Bladder suspension    . Gastric bypass    . Repair peroneal tendons ankle    . Lumbar laminectomy/decompression microdiscectomy  09/11/2011    Procedure: LUMBAR LAMINECTOMY/DECOMPRESSION MICRODISCECTOMY;  Surgeon: Jacki Cones;  Location: [facility];  Service: Orthopedics;  Laterality: Right;  Hemi Laminectomy/Microdiscectomy L5 - S1 on the Right (X-Ray)    History   Social History  . Marital Status: Married    Spouse Name: N/A    Number of Children: N/A  . Years of Education: N/A   Occupational History  . Not on file.   Social History Main Topics  . Smoking status: Former Smoker    Quit date: 10/08/2003  . Smokeless tobacco: Not on file  . Alcohol Use: Yes     Comment: rare  . Drug Use: No  . Sexual Activity:    Other Topics Concern  . Not on file   Social History Narrative   Regularly exercises.     ROS: no fevers or chills, productive  cough, hemoptysis, dysphasia, odynophagia, melena, hematochezia, dysuria, hematuria, rash, seizure activity, orthopnea, PND, pedal edema, claudication. Remaining systems are negative.  Physical Exam: Well-developed well-nourished in no acute distress.  Skin is warm and dry.  HEENT is normal.  Neck is supple.  Chest is clear to auscultation with normal expansion.  Cardiovascular exam is regular rate and rhythm.  Abdominal exam nontender or distended. No masses palpated. Extremities show no edema. neuro grossly intact  ECG     This encounter was created in error - please disregard.

## 2013-05-28 ENCOUNTER — Encounter: Payer: Self-pay | Admitting: Cardiology

## 2013-06-15 ENCOUNTER — Other Ambulatory Visit: Payer: Self-pay | Admitting: Cardiology

## 2013-06-15 NOTE — Telephone Encounter (Signed)
Trying to contact patient in regards to refill on medication ( catapress), need appt with Dr Jens Som due March 2014, contact number has been disconnected, will refill partial script and note on script to call Dr Jens Som office for f/u appt, she has been seen by her PCP recently (03/2013)

## 2013-07-06 DIAGNOSIS — F319 Bipolar disorder, unspecified: Secondary | ICD-10-CM | POA: Diagnosis not present

## 2013-08-15 ENCOUNTER — Emergency Department (INDEPENDENT_AMBULATORY_CARE_PROVIDER_SITE_OTHER)
Admission: EM | Admit: 2013-08-15 | Discharge: 2013-08-15 | Disposition: A | Discharge status: Home / Self Care | Payer: Federal, State, Local not specified - PPO | Source: Home / Self Care | Attending: Family Medicine | Admitting: Family Medicine

## 2013-08-15 ENCOUNTER — Emergency Department (INDEPENDENT_AMBULATORY_CARE_PROVIDER_SITE_OTHER): Payer: Federal, State, Local not specified - PPO

## 2013-08-15 ENCOUNTER — Encounter: Payer: Self-pay | Admitting: Emergency Medicine

## 2013-08-15 DIAGNOSIS — J209 Acute bronchitis, unspecified: Secondary | ICD-10-CM | POA: Diagnosis not present

## 2013-08-15 DIAGNOSIS — R05 Cough: Secondary | ICD-10-CM | POA: Diagnosis not present

## 2013-08-15 DIAGNOSIS — R059 Cough, unspecified: Secondary | ICD-10-CM | POA: Diagnosis not present

## 2013-08-15 LAB — POCT CBC W AUTO DIFF (K'VILLE URGENT CARE)

## 2013-08-15 MED ORDER — BENZONATATE 200 MG PO CAPS: 200.0000 mg | ORAL_CAPSULE | Freq: Every day | ORAL | Status: DC

## 2013-08-15 MED ORDER — CEFDINIR 300 MG PO CAPS: 300.0000 mg | ORAL_CAPSULE | Freq: Two times a day (BID) | ORAL | Status: DC

## 2013-08-15 NOTE — ED Notes (Signed)
Harjit complains of a dry cough for 4 days. She also reports rib pain due to the coughing. She has had some night sweats. Denies fever or chills.

## 2013-08-15 NOTE — ED Provider Notes (Signed)
CSN: [id number]     Arrival date & time 08/15/13  1213 History   First MD Initiated Contact with Patient 08/15/13 1238     Chief Complaint  Patient presents with  . Cough    x 4 days      HPI Comments: Patient complains of onset of a non-productive cough four days ago, worse at night.  She has had sweats but no fever.  No sore throat or nasal congestion.  She has a history of CHF but has had no lower leg edema.  She has bilateral rib soreness from coughing but no pleuritic pain. She admits that her BP pends to remain as high as today's reading, and occasional decreases to about 160/90 to 95.  The history is provided by the patient.    Past Medical History  Diagnosis Date  . HLD (hyperlipidemia)     nec/nos  . HTN (hypertension)     benign essential  . Narcolepsy   . Glucose intolerance (impaired glucose tolerance)   . Chronic diastolic heart failure     hospitalized at [facility]  . Bipolar disorder   . Suicide attempt     hx  . Syncope     admx to [facility] in 2/12: Echo with normal LVF; head CT unremarkable, ECG stable, no further w/u  . Complication of anesthesia     had "breathing problems and ended up in ICU" 03/2010 report on chart  . CHF (congestive heart failure)   . MVP (mitral valve prolapse)   . DDD (degenerative disc disease), lumbar   . GERD (gastroesophageal reflux disease)   . Ulcer   . Diabetes in pregnancy     diet cnontrolled   Past Surgical History  Procedure Laterality Date  . Partial hysterectomy    . Bladder suspension    . Gastric bypass    . Repair peroneal tendons ankle    . Lumbar laminectomy/decompression microdiscectomy  09/11/2011    Procedure: LUMBAR LAMINECTOMY/DECOMPRESSION MICRODISCECTOMY;  Surgeon: Jacki Cones;  Location: [facility];  Service: Orthopedics;  Laterality: Right;  Hemi Laminectomy/Microdiscectomy L5 - S1 on the Right (X-Ray)   Family History  Problem Relation Age of Onset  . Diabetes Mother   . Hypertension Mother    . Heart attack Father 26  . Hypertension Father   . Stroke Other     grandmother   History  Substance Use Topics  . Smoking status: Former Smoker    Quit date: 10/08/2003  . Smokeless tobacco: Not on file  . Alcohol Use: Yes     Comment: rare   OB History   Grav Para Term Preterm Abortions TAB SAB Ect Mult Living                 Review of Systems No sore throat + cough No pleuritic pain, but sore ribs when coughing No wheezing No nasal congestion No post-nasal drainage No sinus pain/pressure No itchy/red eyes No earache No hemoptysis + SOB with activity No fever/chills; + sweats No nausea No vomiting No abdominal pain No diarrhea No urinary symptoms No skin rash + fatigue No myalgias No headache    Allergies  Codeine  Home Medications   Current Outpatient Rx  Name  Route  Sig  Dispense  Refill  . amLODipine (NORVASC) 10 MG tablet   Oral   Take 1 tablet (10 mg total) by mouth daily.   180 tablet   3   . aspirin (ADULT ASPIRIN EC LOW  STRENGTH) 81 MG EC tablet   Oral   Take 81 mg by mouth daily.           . carvedilol (COREG) 25 MG tablet      TAKE ONE TABLET BY MOUTH TWICE DAILY   60 tablet   5   . clonazePAM (KLONOPIN) 1 MG tablet   Oral   Take 1 mg by mouth 3 (three) times daily.          . cloNIDine (CATAPRES) 0.2 MG tablet      TAKE TWO TABLETS BY MOUTH THREE TIMES DAILY   180 tablet   0     1 month supply, patient needs to call and make app ...   . Cyanocobalamin (VITAMIN B-12 PO)   Oral   Take 1 tablet by mouth daily.           . DULoxetine (CYMBALTA) 60 MG capsule   Oral   Take 60 mg by mouth every evening.          . enalapril (VASOTEC) 20 MG tablet      TAKE ONE TABLET BY MOUTH TWICE DAILY   60 tablet   3   . esomeprazole (NEXIUM) 40 MG capsule   Oral   Take 40 mg by mouth 2 (two) times daily as needed. 1 capsule daily scheduled, will take an additional capsule if needed         . furosemide (LASIX) 20 MG  tablet   Oral   Take 20 mg by mouth.          . hydrALAZINE (APRESOLINE) 50 MG tablet   Oral   Take 1 tablet (50 mg total) by mouth 4 (four) times daily.   120 tablet   1   . L-Methylfolate (DEPLIN) 7.5 MG TABS   Oral   Take 1 tablet by mouth every evening.          . lamoTRIgine (LAMICTAL) 25 MG tablet   Oral   Take 25 mg by mouth every evening.          [name] spironolactone (ALDACTONE) 50 MG tablet   Oral   Take 1 tablet (50 mg total) by mouth daily.   30 tablet   1   . VENTOLIN HFA 108 (90 BASE) MCG/ACT inhaler      prn         . benzonatate (TESSALON) 200 MG capsule   Oral   Take 1 capsule (200 mg total) by mouth at bedtime. Take as needed for cough   12 capsule   0   . cefdinir (OMNICEF) 300 MG capsule   Oral   Take 1 capsule (300 mg total) by mouth 2 (two) times daily.   20 capsule   0   . HYDROmorphone (DILAUDID) 2 MG tablet   Oral   Take 2 mg by mouth every 4 (four) hours as needed. For pain         . Multiple Vitamins-Minerals (MULTIVITAMINS THER. W/MINERALS) TABS   Oral   Take 1 tablet by mouth daily.            BP 181/115  Pulse 82  Temp(Src) 97.9 F (36.6 C) (Oral)  Ht 5\' 6"  (1.676 m)  Wt 177 lb (80.287 kg)  BMI 28.58 kg/m2  SpO2 100% Physical Exam Nursing notes and Vital Signs reviewed. Appearance:  Patient appears stated age, and in no acute distress Eyes:  Pupils are equal, round, and reactive to light and accomodation.  Extraocular movement is intact.  Conjunctivae are not inflamed  Ears:  Canals normal.  Tympanic membranes normal.  Nose:  Mildly congested turbinates.  No sinus tenderness.    Pharynx:  Normal Neck:  Supple.  No adenopathy.  No JVD Lungs:  Clear to auscultation.  Breath sounds are equal.  Heart:  Regular rate and rhythm without murmurs, rubs, or gallops.  Abdomen:  Nontender without masses or hepatosplenomegaly.  Bowel sounds are present.  No CVA or flank tenderness.  Extremities:  No edema.  No calf  tenderness Skin:  No rash present.   ED Course  Procedures  none    Labs Reviewed  POCT CBC W AUTO DIFF ([facility]): WBC 10.3; LY 25.3; MO 6.1; GR 68.6; Hgb 14.4; Platelets 199    Imaging Review Dg Chest 2 View  08/15/2013   CLINICAL DATA:  4 days of cough.  EXAM: CHEST  2 VIEW  COMPARISON:  03/26/2013.  FINDINGS: The heart size and mediastinal contours are within normal limits. Both lungs are clear. The visualized skeletal structures are unremarkable.  IMPRESSION: No active cardiopulmonary disease.   Electronically Signed   By: Elige Ko   On: 08/15/2013 13:26      MDM   1. Acute bronchitis.  Her new onset cough does not appear to represent exacerbation of CHF   Note uncontrolled hypertension  Begin Omnicef. Prescription written for Benzonatate [facility]) to take at bedtime for night-time cough.  Take plain Mucinex (guaifenesin) twice daily for cough and congestion.  Increase fluid intake, rest. Follow-up with family doctor for blood pressure within the next 5 to 7 days. If symptoms become significantly worse during the night or over the weekend, proceed to the local emergency room.     Lattie Haw, MD 08/16/13 [phone]

## 2013-08-17 DIAGNOSIS — F319 Bipolar disorder, unspecified: Secondary | ICD-10-CM | POA: Diagnosis not present

## 2013-09-21 DIAGNOSIS — F319 Bipolar disorder, unspecified: Secondary | ICD-10-CM | POA: Diagnosis not present

## 2013-09-21 DIAGNOSIS — F314 Bipolar disorder, current episode depressed, severe, without psychotic features: Secondary | ICD-10-CM | POA: Diagnosis not present

## 2013-09-21 DIAGNOSIS — Z87891 Personal history of nicotine dependence: Secondary | ICD-10-CM | POA: Diagnosis not present

## 2013-10-04 ENCOUNTER — Other Ambulatory Visit: Payer: Self-pay

## 2013-10-04 MED ORDER — SPIRONOLACTONE 50 MG PO TABS: 50.0000 mg | ORAL_TABLET | Freq: Every day | ORAL | Status: DC

## 2013-10-04 MED ORDER — HYDRALAZINE HCL 50 MG PO TABS: 50.0000 mg | ORAL_TABLET | Freq: Four times a day (QID) | ORAL | Status: DC

## 2013-10-04 MED ORDER — AMLODIPINE BESYLATE 10 MG PO TABS: 10.0000 mg | ORAL_TABLET | Freq: Every day | ORAL | Status: DC

## 2013-10-04 MED ORDER — ENALAPRIL MALEATE 20 MG PO TABS: ORAL_TABLET | ORAL | Status: DC

## 2013-10-04 MED ORDER — FUROSEMIDE 20 MG PO TABS: 20.0000 mg | ORAL_TABLET | Freq: Every day | ORAL | Status: DC

## 2013-10-04 MED ORDER — CLONIDINE HCL 0.2 MG PO TABS: ORAL_TABLET | ORAL | Status: DC

## 2013-10-10 IMAGING — CR DG SPINE 1V PORT
1 series · 1 of 1 positions shown · non-contrast
Comparison: Intraoperative film of 2256 hours.  Abdominal pelvic
CTA of 09/11/2009.  Preoperative study of 09/06/2011.

CLINICAL DATA: L5-S1 surgery.

DG SPINE PORTABLE - 1 VIEW

[lateral]
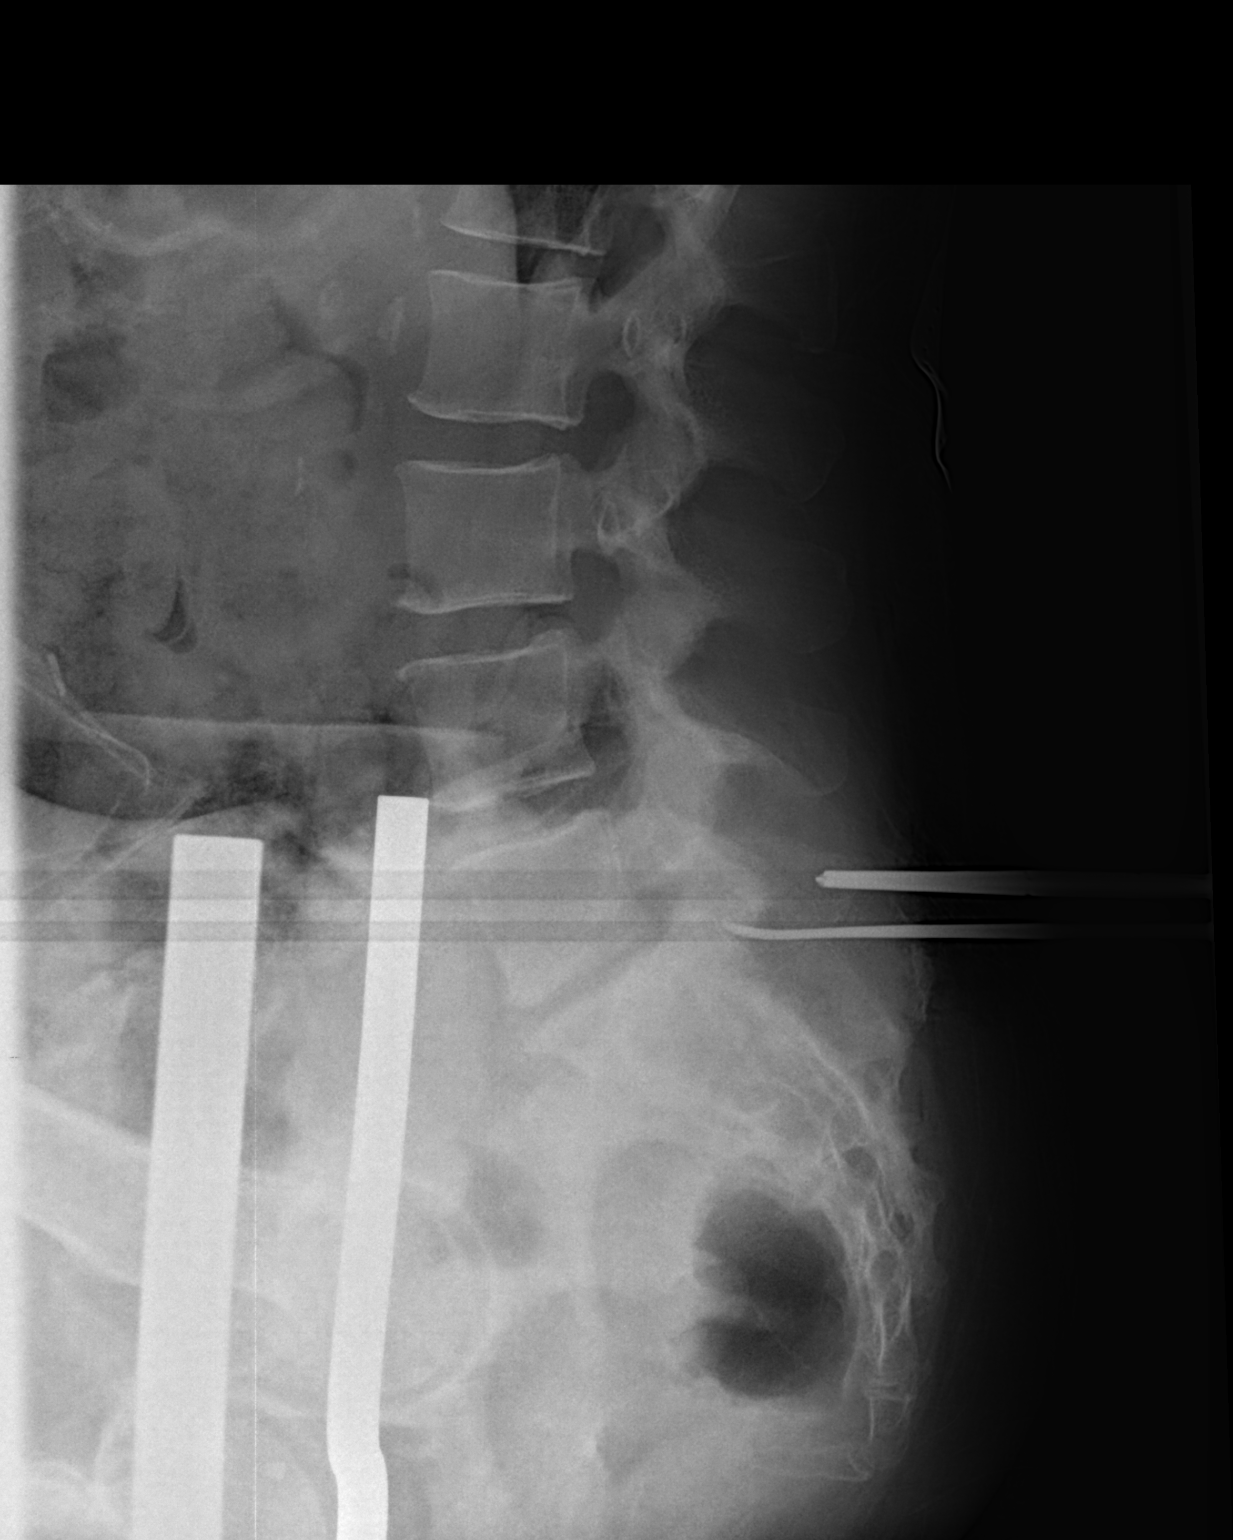

[1 of 1 positions shown; findings below may reference images not displayed]

FINDINGS: Surgical devices are seen projecting posterior to the L5-
S1 level.  Degenerative disc disease at this level.  Vertebral body
height maintained.  Aortic atherosclerosis.
IMPRESSION: Intraoperative localization of L5-S1.

## 2013-10-10 IMAGING — CR DG SPINE 1V PORT
1 series · 1 of 1 positions shown · non-contrast
Comparison: Earlier in the day, at 7673 hours.

CLINICAL DATA: L5-S1 surgery.

DG SPINE PORTABLE - 1 VIEW

[lateral]
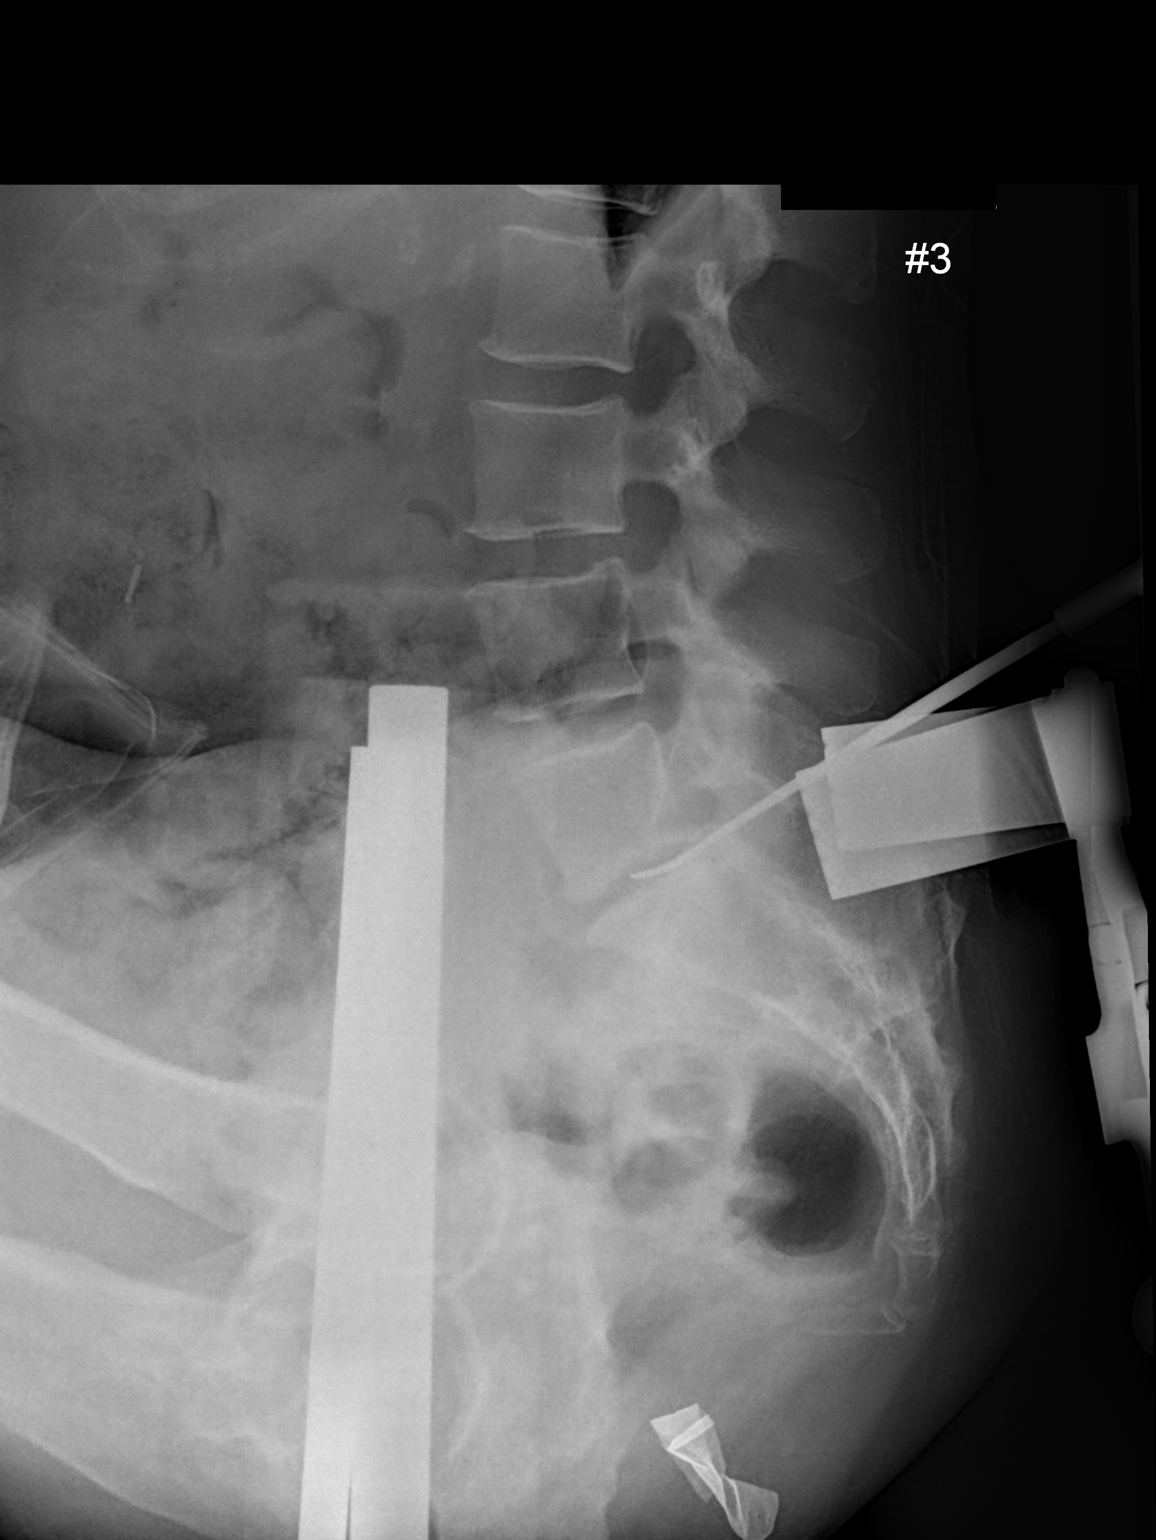

[1 of 1 positions shown; findings below may reference images not displayed]

FINDINGS: Single lateral view, labeled 9754 hours.  Surgical device
projects over the L5-S1 level.  No other interval change.
IMPRESSION: Intraoperative localization of L5/S1.

## 2013-10-19 DIAGNOSIS — M961 Postlaminectomy syndrome, not elsewhere classified: Secondary | ICD-10-CM | POA: Diagnosis not present

## 2013-11-01 DIAGNOSIS — F319 Bipolar disorder, unspecified: Secondary | ICD-10-CM | POA: Diagnosis not present

## 2013-11-03 ENCOUNTER — Ambulatory Visit (INDEPENDENT_AMBULATORY_CARE_PROVIDER_SITE_OTHER): Payer: Federal, State, Local not specified - PPO | Admitting: Cardiology

## 2013-11-03 ENCOUNTER — Encounter: Payer: Self-pay | Admitting: Cardiology

## 2013-11-03 VITALS — BP 130/80 | HR 69 | Wt 182.0 lb

## 2013-11-03 DIAGNOSIS — I10 Essential (primary) hypertension: Secondary | ICD-10-CM | POA: Diagnosis not present

## 2013-11-03 DIAGNOSIS — E785 Hyperlipidemia, unspecified: Secondary | ICD-10-CM

## 2013-11-03 NOTE — Assessment & Plan Note (Signed)
Blood pressure controlled. Continue present medications. Check potassium and renal function. 

## 2013-11-03 NOTE — Patient Instructions (Signed)
Your physician wants you to follow-up in: [place] will receive a reminder letter in the mail two months in advance. If you don't receive a letter, please call our office to schedule the follow-up appointment.   Your physician recommends that you HAVE LAB WORK TODAY

## 2013-11-03 NOTE — Progress Notes (Signed)
HPI: FU hypertension and chest pain. Previous renal Dopplers were technically difficult. There was no stenosis on the left and the right was not well visualized. Follow up CTA to rule out renal artery stenosis was done and was negative. Echocardiogram in February of 2012 showed normal LV function. Last Myoview in November 2012 showed an ejection fraction of 71%, breast attenuation but no ischemia. I last saw her in September of 2013. Since then, the patient has dyspnea with more extreme activities but not with routine activities. It is relieved with rest. It is not associated with chest pain. There is no orthopnea, PND or pedal edema. There is no syncope or palpitations. There is no exertional chest pain.    Current Outpatient Prescriptions  Medication Sig Dispense Refill  . amLODipine (NORVASC) 10 MG tablet Take 1 tablet (10 mg total) by mouth daily.  30 tablet  0  . aspirin (ADULT ASPIRIN EC LOW STRENGTH) 81 MG EC tablet Take 81 mg by mouth daily.        . benzonatate (TESSALON) 200 MG capsule Take 1 capsule (200 mg total) by mouth at bedtime. Take as needed for cough  12 capsule  0  . carvedilol (COREG) 25 MG tablet TAKE ONE TABLET BY MOUTH TWICE DAILY  60 tablet  5  . cefdinir (OMNICEF) 300 MG capsule Take 1 capsule (300 mg total) by mouth 2 (two) times daily.  20 capsule  0  . clonazePAM (KLONOPIN) 1 MG tablet Take 1 mg by mouth 3 (three) times daily.       . cloNIDine (CATAPRES) 0.2 MG tablet TAKE TWO TABLETS BY MOUTH THREE TIMES DAILY  180 tablet  0  . Cyanocobalamin (VITAMIN B-12 PO) Take 1 tablet by mouth daily.        . DULoxetine (CYMBALTA) 60 MG capsule Take 60 mg by mouth every evening.       . enalapril (VASOTEC) 20 MG tablet TAKE ONE TABLET BY MOUTH TWICE DAILY  60 tablet  0  . esomeprazole (NEXIUM) 40 MG capsule Take 40 mg by mouth 2 (two) times daily as needed. 1 capsule daily scheduled, will take an additional capsule if needed      . furosemide (LASIX) 20 MG tablet Take  1 tablet (20 mg total) by mouth daily.  30 tablet  0  . hydrALAZINE (APRESOLINE) 50 MG tablet Take 1 tablet (50 mg total) by mouth 4 (four) times daily.  120 tablet  0  . HYDROmorphone (DILAUDID) 2 MG tablet Take 2 mg by mouth every 4 (four) hours as needed. For pain      . L-Methylfolate (DEPLIN) 7.5 MG TABS Take 1 tablet by mouth every evening.       . lamoTRIgine (LAMICTAL) 25 MG tablet Take 25 mg by mouth every evening.       . Multiple Vitamins-Minerals (MULTIVITAMINS THER. W/MINERALS) TABS Take 1 tablet by mouth daily.        [name] spironolactone (ALDACTONE) 50 MG tablet Take 1 tablet (50 mg total) by mouth daily.  30 tablet  0  . VENTOLIN HFA 108 (90 BASE) MCG/ACT inhaler prn       No current facility-administered medications for this visit.     Past Medical History  Diagnosis Date  . HLD (hyperlipidemia)     nec/nos  . HTN (hypertension)     benign essential  . Narcolepsy   . Glucose intolerance (impaired glucose tolerance)   . Chronic diastolic heart failure  hospitalized at [facility]  . Bipolar disorder   . Suicide attempt     hx  . Syncope     admx to [facility] in 2/12: Echo with normal LVF; head CT unremarkable, ECG stable, no further w/u  . Complication of anesthesia     had "breathing problems and ended up in ICU" 03/2010 report on chart  . CHF (congestive heart failure)   . MVP (mitral valve prolapse)   . DDD (degenerative disc disease), lumbar   . GERD (gastroesophageal reflux disease)   . Ulcer   . Diabetes in pregnancy     diet cnontrolled    Past Surgical History  Procedure Laterality Date  . Partial hysterectomy    . Bladder suspension    . Gastric bypass    . Repair peroneal tendons ankle    . Lumbar laminectomy/decompression microdiscectomy  09/11/2011    Procedure: LUMBAR LAMINECTOMY/DECOMPRESSION MICRODISCECTOMY;  Surgeon: Tobi Bastos;  Location: [facility];  Service: Orthopedics;  Laterality: Right;  Hemi Laminectomy/Microdiscectomy L5 - S1  on the Right (X-Ray)    History   Social History  . Marital Status: Married    Spouse Name: N/A    Number of Children: N/A  . Years of Education: N/A   Occupational History  . Not on file.   Social History Main Topics  . Smoking status: Former Smoker    Quit date: 10/08/2003  . Smokeless tobacco: Not on file  . Alcohol Use: Yes     Comment: rare  . Drug Use: No  . Sexual Activity:    Other Topics Concern  . Not on file   Social History Narrative   Regularly exercises.     ROS: back pain but no fevers or chills, productive cough, hemoptysis, dysphasia, odynophagia, melena, hematochezia, dysuria, hematuria, rash, seizure activity, orthopnea, PND, pedal edema, claudication. Remaining systems are negative.  Physical Exam: Well-developed well-nourished in no acute distress.  Skin is warm and dry.  HEENT is normal.  Neck is supple.  Chest is clear to auscultation with normal expansion.  Cardiovascular exam is regular rate and rhythm.  Abdominal exam nontender or distended. No masses palpated. Extremities show no edema. neuro grossly intact  ECG sinus rhythm at a rate of 69. Right axis deviation, right atrial enlargement, nonspecific ST changes.

## 2013-11-03 NOTE — Assessment & Plan Note (Signed)
Management per primary care. 

## 2013-11-04 LAB — BASIC METABOLIC PANEL WITH GFR
BUN: 9 mg/dL (ref 6–23)
CO2: 31 mEq/L (ref 19–32)
CREATININE: 0.7 mg/dL (ref 0.50–1.10)
Calcium: 9.3 mg/dL (ref 8.4–10.5)
Chloride: 102 mEq/L (ref 96–112)
Glucose, Bld: 94 mg/dL (ref 70–99)
POTASSIUM: 4.3 meq/L (ref 3.5–5.3)
Sodium: 137 mEq/L (ref 135–145)

## 2013-11-05 DIAGNOSIS — Z9889 Other specified postprocedural states: Secondary | ICD-10-CM | POA: Diagnosis not present

## 2013-11-05 DIAGNOSIS — M5126 Other intervertebral disc displacement, lumbar region: Secondary | ICD-10-CM | POA: Diagnosis not present

## 2013-11-05 DIAGNOSIS — M47817 Spondylosis without myelopathy or radiculopathy, lumbosacral region: Secondary | ICD-10-CM | POA: Diagnosis not present

## 2013-11-05 DIAGNOSIS — M5137 Other intervertebral disc degeneration, lumbosacral region: Secondary | ICD-10-CM | POA: Diagnosis not present

## 2013-12-01 ENCOUNTER — Telehealth: Payer: Self-pay | Admitting: *Deleted

## 2013-12-01 NOTE — Telephone Encounter (Signed)
lvm informing pt that form is up front ready for p/u.[name]

## 2013-12-09 ENCOUNTER — Other Ambulatory Visit: Payer: Self-pay

## 2013-12-09 DIAGNOSIS — Z1231 Encounter for screening mammogram for malignant neoplasm of breast: Secondary | ICD-10-CM

## 2013-12-13 ENCOUNTER — Other Ambulatory Visit: Payer: Self-pay | Admitting: Cardiology

## 2013-12-14 DIAGNOSIS — Z87891 Personal history of nicotine dependence: Secondary | ICD-10-CM | POA: Diagnosis not present

## 2013-12-14 DIAGNOSIS — F319 Bipolar disorder, unspecified: Secondary | ICD-10-CM | POA: Diagnosis not present

## 2013-12-15 ENCOUNTER — Other Ambulatory Visit: Payer: Self-pay

## 2013-12-15 DIAGNOSIS — F319 Bipolar disorder, unspecified: Secondary | ICD-10-CM | POA: Diagnosis not present

## 2013-12-15 MED ORDER — CLONIDINE HCL 0.2 MG PO TABS: ORAL_TABLET | ORAL | Status: DC

## 2013-12-30 ENCOUNTER — Ambulatory Visit: Payer: Federal, State, Local not specified - PPO

## 2014-01-20 DIAGNOSIS — F319 Bipolar disorder, unspecified: Secondary | ICD-10-CM | POA: Diagnosis not present

## 2014-02-24 DIAGNOSIS — F319 Bipolar disorder, unspecified: Secondary | ICD-10-CM | POA: Diagnosis not present

## 2014-03-22 DIAGNOSIS — F319 Bipolar disorder, unspecified: Secondary | ICD-10-CM | POA: Diagnosis not present

## 2014-06-17 DIAGNOSIS — I10 Essential (primary) hypertension: Secondary | ICD-10-CM | POA: Diagnosis not present

## 2014-06-17 DIAGNOSIS — H524 Presbyopia: Secondary | ICD-10-CM | POA: Diagnosis not present

## 2014-06-17 DIAGNOSIS — H35039 Hypertensive retinopathy, unspecified eye: Secondary | ICD-10-CM | POA: Diagnosis not present

## 2014-08-17 DIAGNOSIS — F319 Bipolar disorder, unspecified: Secondary | ICD-10-CM | POA: Diagnosis not present

## 2014-08-24 DIAGNOSIS — F319 Bipolar disorder, unspecified: Secondary | ICD-10-CM | POA: Diagnosis not present

## 2014-09-08 DIAGNOSIS — F319 Bipolar disorder, unspecified: Secondary | ICD-10-CM | POA: Diagnosis not present

## 2014-09-21 DIAGNOSIS — F319 Bipolar disorder, unspecified: Secondary | ICD-10-CM | POA: Diagnosis not present

## 2014-10-07 HISTORY — PX: DENTAL SURGERY: SHX609

## 2014-10-11 DIAGNOSIS — F319 Bipolar disorder, unspecified: Secondary | ICD-10-CM | POA: Diagnosis not present

## 2014-10-11 DIAGNOSIS — Z681 Body mass index (BMI) 19 or less, adult: Secondary | ICD-10-CM | POA: Diagnosis not present

## 2014-11-10 DIAGNOSIS — F319 Bipolar disorder, unspecified: Secondary | ICD-10-CM | POA: Diagnosis not present

## 2014-11-10 DIAGNOSIS — Z681 Body mass index (BMI) 19 or less, adult: Secondary | ICD-10-CM | POA: Diagnosis not present

## 2014-12-21 ENCOUNTER — Other Ambulatory Visit: Payer: Self-pay | Admitting: Family Medicine

## 2014-12-21 ENCOUNTER — Encounter: Payer: Self-pay | Admitting: Family Medicine

## 2014-12-21 ENCOUNTER — Ambulatory Visit (INDEPENDENT_AMBULATORY_CARE_PROVIDER_SITE_OTHER): Payer: Federal, State, Local not specified - PPO | Admitting: Family Medicine

## 2014-12-21 VITALS — BP 167/98 | HR 65 | Ht 66.0 in | Wt 185.0 lb

## 2014-12-21 DIAGNOSIS — R310 Gross hematuria: Secondary | ICD-10-CM

## 2014-12-21 DIAGNOSIS — E785 Hyperlipidemia, unspecified: Secondary | ICD-10-CM

## 2014-12-21 DIAGNOSIS — E049 Nontoxic goiter, unspecified: Secondary | ICD-10-CM

## 2014-12-21 DIAGNOSIS — Z1159 Encounter for screening for other viral diseases: Secondary | ICD-10-CM

## 2014-12-21 DIAGNOSIS — Z23 Encounter for immunization: Secondary | ICD-10-CM | POA: Diagnosis not present

## 2014-12-21 DIAGNOSIS — Z1231 Encounter for screening mammogram for malignant neoplasm of breast: Secondary | ICD-10-CM

## 2014-12-21 DIAGNOSIS — H547 Unspecified visual loss: Secondary | ICD-10-CM

## 2014-12-21 DIAGNOSIS — R7309 Other abnormal glucose: Secondary | ICD-10-CM | POA: Diagnosis not present

## 2014-12-21 DIAGNOSIS — I10 Essential (primary) hypertension: Secondary | ICD-10-CM

## 2014-12-21 DIAGNOSIS — Z114 Encounter for screening for human immunodeficiency virus [HIV]: Secondary | ICD-10-CM

## 2014-12-21 DIAGNOSIS — Z0000 Encounter for general adult medical examination without abnormal findings: Secondary | ICD-10-CM

## 2014-12-21 LAB — CBC WITH DIFFERENTIAL/PLATELET
BASOS ABS: 0.1 10*3/uL (ref 0.0–0.1)
Basophils Relative: 1 % (ref 0–1)
Eosinophils Absolute: 0.5 10*3/uL (ref 0.0–0.7)
Eosinophils Relative: 6 % — ABNORMAL HIGH (ref 0–5)
HEMATOCRIT: 43.6 % (ref 36.0–46.0)
Hemoglobin: 14.6 g/dL (ref 12.0–15.0)
LYMPHS ABS: 2.7 10*3/uL (ref 0.7–4.0)
LYMPHS PCT: 33 % (ref 12–46)
MCH: 29 pg (ref 26.0–34.0)
MCHC: 33.5 g/dL (ref 30.0–36.0)
MCV: 86.7 fL (ref 78.0–100.0)
MONO ABS: 0.5 10*3/uL (ref 0.1–1.0)
MPV: 11.2 fL (ref 8.6–12.4)
Monocytes Relative: 6 % (ref 3–12)
Neutro Abs: 4.5 10*3/uL (ref 1.7–7.7)
Neutrophils Relative %: 54 % (ref 43–77)
PLATELETS: 218 10*3/uL (ref 150–400)
RBC: 5.03 MIL/uL (ref 3.87–5.11)
RDW: 14 % (ref 11.5–15.5)
WBC: 8.3 10*3/uL (ref 4.0–10.5)

## 2014-12-21 LAB — COMPLETE METABOLIC PANEL WITH GFR
ALK PHOS: 106 U/L (ref 39–117)
ALT: 8 U/L (ref 0–35)
AST: 15 U/L (ref 0–37)
Albumin: 4.1 g/dL (ref 3.5–5.2)
BUN: 8 mg/dL (ref 6–23)
CO2: 26 mEq/L (ref 19–32)
CREATININE: 0.77 mg/dL (ref 0.50–1.10)
Calcium: 8.9 mg/dL (ref 8.4–10.5)
Chloride: 107 mEq/L (ref 96–112)
GFR, EST NON AFRICAN AMERICAN: 86 mL/min
GFR, Est African American: >89 mL/min
GLUCOSE: 83 mg/dL (ref 70–99)
POTASSIUM: 3.6 meq/L (ref 3.5–5.3)
Sodium: 142 mEq/L (ref 135–145)
Total Bilirubin: 0.6 mg/dL (ref 0.2–1.2)
Total Protein: 6.6 g/dL (ref 6.0–8.3)

## 2014-12-21 LAB — LIPID PANEL
Cholesterol: 169 mg/dL (ref 0–200)
HDL: 55 mg/dL (ref >=46)
LDL Cholesterol: 94 mg/dL (ref 0–99)
Total CHOL/HDL Ratio: 3.1 Ratio
Triglycerides: 98 mg/dL (ref <150)
VLDL: 20 mg/dL (ref 0–40)

## 2014-12-21 LAB — URINALYSIS, ROUTINE W REFLEX MICROSCOPIC
BILIRUBIN URINE: NEGATIVE
GLUCOSE, UA: NEGATIVE mg/dL
Hgb urine dipstick: NEGATIVE
KETONES UR: NEGATIVE mg/dL
Leukocytes, UA: NEGATIVE
Nitrite: NEGATIVE
Protein, ur: NEGATIVE mg/dL
Specific Gravity, Urine: 1.01 (ref 1.005–1.030)
Urobilinogen, UA: 1 mg/dL (ref 0.0–1.0)
pH: 6 (ref 5.0–8.0)

## 2014-12-21 LAB — POCT GLYCOSYLATED HEMOGLOBIN (HGB A1C): Hemoglobin A1C: 5.8

## 2014-12-21 LAB — HEPATITIS C ANTIBODY: HCV Ab: NEGATIVE

## 2014-12-21 NOTE — Progress Notes (Signed)
Subjective:    [name] is a 59 y.o. female who presents for Medicare Annual/Subsequent preventive examination.  Preventive Screening-Counseling & Management  Tobacco History  Smoking status  . Former Smoker  . Quit date: 10/08/2003  Smokeless tobacco  . Not on file     Problems Prior to Visit 1.  Today she would like me to examine her neck. She says she feels like there something in there and sometimes emesis feels a little bit of pressure when she swallows. She denies food getting stuck or a sore throat. She's noticed this more over the last couple of months. No trauma, fever or pain.  Current Problems (verified) Patient Active Problem List   Diagnosis Date Noted  . 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 Syncope and collapse 08/08/2011  . DDD (degenerative disc disease), lumbar   . HIRSUTISM 06/02/2009  . BIPOLAR DISORDER UNSPECIFIED 05/17/2009  . DYSPNEA 05/17/2009  . CHEST PAIN UNSPECIFIED 05/17/2009  . SLEEP DISORDER 08/31/2008  . Pain in Soft Tissues of Limb 08/03/2008  . Abnormal blood sugar 12/22/2007  . Hyperlipidemia 07/15/2007  . HYPERTENSION, BENIGN ESSENTIAL 07/15/2007  . FATIGUE 07/15/2007    Medications Prior to Visit Current Outpatient Prescriptions on File Prior to Visit  Medication Sig Dispense Refill  . amLODipine (NORVASC) 10 MG tablet Take 1 tablet (10 mg total) by mouth daily. 30 tablet 0  . aspirin (ADULT ASPIRIN EC LOW STRENGTH) 81 MG EC tablet Take 81 mg by mouth daily.      . benzonatate (TESSALON) 200 MG capsule Take 1 capsule (200 mg total) by mouth at bedtime. Take as needed for cough 12 capsule 0  . carvedilol (COREG) 25 MG tablet TAKE ONE TABLET BY MOUTH TWICE DAILY 60 tablet 5  . cefdinir (OMNICEF) 300 MG capsule Take 1 capsule (300 mg total) by mouth 2 (two) times daily. 20 capsule 0  . clonazePAM (KLONOPIN) 1 MG tablet Take 1 mg by mouth 3 (three) times daily.     . cloNIDine (CATAPRES)  0.2 MG tablet TAKE TWO TABLETS BY MOUTH THREE TIMES DAILY 180 tablet 3  . Cyanocobalamin (VITAMIN B-12 PO) Take 1 tablet by mouth daily.      . DULoxetine (CYMBALTA) 60 MG capsule Take 60 mg by mouth every evening.     . enalapril (VASOTEC) 20 MG tablet TAKE ONE TABLET BY MOUTH TWICE DAILY 60 tablet 5  . esomeprazole (NEXIUM) 40 MG capsule Take 40 mg by mouth 2 (two) times daily as needed. 1 capsule daily scheduled, will take an additional capsule if needed    . furosemide (LASIX) 20 MG tablet Take 1 tablet (20 mg total) by mouth daily. 30 tablet 5  . hydrALAZINE (APRESOLINE) 50 MG tablet Take 1 tablet (50 mg total) by mouth 4 (four) times daily. 120 tablet 0  . HYDROmorphone (DILAUDID) 2 MG tablet Take 2 mg by mouth every 4 (four) hours as needed. For pain    . L-Methylfolate (DEPLIN) 7.5 MG TABS Take 1 tablet by mouth every evening.     . lamoTRIgine (LAMICTAL) 25 MG tablet Take 25 mg by mouth every evening.     . Multiple Vitamins-Minerals (MULTIVITAMINS THER. W/MINERALS) TABS Take 1 tablet by mouth daily.      [name] spironolactone (ALDACTONE) 50 MG tablet Take 1 tablet (50 mg total) by mouth daily. 30 tablet 0  . VENTOLIN HFA 108 (90 BASE) MCG/ACT inhaler prn     No current facility-administered medications on  file prior to visit.    Current Medications (verified) Current Outpatient Prescriptions  Medication Sig Dispense Refill  . amLODipine (NORVASC) 10 MG tablet Take 1 tablet (10 mg total) by mouth daily. 30 tablet 0  . aspirin (ADULT ASPIRIN EC LOW STRENGTH) 81 MG EC tablet Take 81 mg by mouth daily.      . benzonatate (TESSALON) 200 MG capsule Take 1 capsule (200 mg total) by mouth at bedtime. Take as needed for cough 12 capsule 0  . carvedilol (COREG) 25 MG tablet TAKE ONE TABLET BY MOUTH TWICE DAILY 60 tablet 5  . cefdinir (OMNICEF) 300 MG capsule Take 1 capsule (300 mg total) by mouth 2 (two) times daily. 20 capsule 0  . clonazePAM (KLONOPIN) 1 MG tablet Take 1 mg by mouth 3 (three)  times daily.     . cloNIDine (CATAPRES) 0.2 MG tablet TAKE TWO TABLETS BY MOUTH THREE TIMES DAILY 180 tablet 3  . Cyanocobalamin (VITAMIN B-12 PO) Take 1 tablet by mouth daily.      . DULoxetine (CYMBALTA) 60 MG capsule Take 60 mg by mouth every evening.     . enalapril (VASOTEC) 20 MG tablet TAKE ONE TABLET BY MOUTH TWICE DAILY 60 tablet 5  . esomeprazole (NEXIUM) 40 MG capsule Take 40 mg by mouth 2 (two) times daily as needed. 1 capsule daily scheduled, will take an additional capsule if needed    . furosemide (LASIX) 20 MG tablet Take 1 tablet (20 mg total) by mouth daily. 30 tablet 5  . hydrALAZINE (APRESOLINE) 50 MG tablet Take 1 tablet (50 mg total) by mouth 4 (four) times daily. 120 tablet 0  . HYDROmorphone (DILAUDID) 2 MG tablet Take 2 mg by mouth every 4 (four) hours as needed. For pain    . L-Methylfolate (DEPLIN) 7.5 MG TABS Take 1 tablet by mouth every evening.     . lamoTRIgine (LAMICTAL) 25 MG tablet Take 25 mg by mouth every evening.     . Multiple Vitamins-Minerals (MULTIVITAMINS THER. W/MINERALS) TABS Take 1 tablet by mouth daily.      [name] spironolactone (ALDACTONE) 50 MG tablet Take 1 tablet (50 mg total) by mouth daily. 30 tablet 0  . VENTOLIN HFA 108 (90 BASE) MCG/ACT inhaler prn     No current facility-administered medications for this visit.     Allergies (verified) Codeine   PAST HISTORY  Family History Family History  Problem Relation Age of Onset  . Diabetes Mother   . Hypertension Mother   . Heart attack Father 34  . Hypertension Father   . Stroke Other     grandmother    Social History History  Substance Use Topics  . Smoking status: Former Smoker    Quit date: 10/08/2003  . Smokeless tobacco: Not on file  . Alcohol Use: Yes     Comment: rare     Are there smokers in your home (other than you)? Yes  Risk Factors Current exercise habits: The patient does not participate in regular exercise at present.  Dietary issues discussed: None   Cardiac  risk factors: hypertension and sedentary lifestyle.  Depression Screen (Note: if answer to either of the following is "Yes", a more complete depression screening is indicated)   Over the past two weeks, have you felt down, depressed or hopeless? No  Over the past two weeks, have you felt little interest or pleasure in doing things? No  Have you lost interest or pleasure in daily life? No  Do you  often feel hopeless? No  Do you cry easily over simple problems? No  Activities of Daily Living In your present state of health, do you have any difficulty performing the following activities?:  Driving? Yes Managing money?  No Feeding yourself? No Getting from bed to chair? Yes  Climbing a flight of stairs? Yes Preparing food and eating?: No Bathing or showering? Yes Getting dressed: No Getting to the toilet? No Using the toilet:No Moving around from place to place: Yes In the past year have you fallen or had a near fall?:Yes   Are you sexually active?  Yes  Do you have more than one partner?  No  Hearing Difficulties: No Do you often ask people to speak up or repeat themselves? No Do you experience ringing or noises in your ears? No Do you have difficulty understanding soft or whispered voices? No   Do you feel that you have a problem with memory? No  Do you often misplace items? No  Do you feel safe at home?  Yes  Cognitive Testing  Alert? Yes  Normal Appearance?Yes  Oriented to person? Yes  Place? Yes   Time? Yes  Recall of three objects?  Yes  Can perform simple calculations? Yes  Displays appropriate judgment?Yes  Can read the correct time from a watch face?Yes  6 CIT score of 2/28 ( normal)   Advanced Directives have been discussed with the patient? Yes  List the Names of Other Physician/Practitioners you currently use: 1.  Dr. Stanford Breed - Cardiology   Indicate any recent Medical Services you may have received from other than [facility] providers in the past year (date may  be approximate).  Immunization History  Administered Date(s) Administered  . Influenza Split 06/27/2011  . Influenza Whole 07/15/2007    Screening Tests Health Maintenance  Topic Date Due  . Hepatitis C Screening  [birth year]  . HIV Screening  05/13/1972  . PAP SMEAR  05/14/1975  . TETANUS/TDAP  05/13/1976  . MAMMOGRAM  05/14/2007  . COLONOSCOPY  05/14/2007  . INFLUENZA VACCINE  05/07/2014    All answers were reviewed with the patient and necessary referrals were made:  [name],CATHERINE, MD   12/21/2014   History reviewed: allergies, current medications, past family history, past medical history, past social history, past surgical history and problem list  Review of Systems A comprehensive review of systems was negative.    Objective:     Vision by Snellen chart: right eye:20/70, left eye:20/100  Body mass index is 29.87 kg/(m^2). BP 167/98 mmHg  Pulse 65  Ht 5\' 6"  (1.676 m)  Wt 185 lb (83.915 kg)  BMI 29.87 kg/m2  SpO2 98%  BP 167/98 mmHg  Pulse 65  Ht 5\' 6"  (1.676 m)  Wt 185 lb (83.915 kg)  BMI 29.87 kg/m2  SpO2 98% General appearance: alert, cooperative and appears stated age Head: Normocephalic, without obvious abnormality, atraumatic Eyes: conj clear, EOMI, PEELRA Ears: normal TM's and external ear canals both ears Nose: Nares normal. Septum midline. Mucosa normal. No drainage or sinus tenderness. Throat: lips, mucosa, and tongue normal; teeth and gums normal Neck: no adenopathy, no carotid bruit, no JVD, supple, symmetrical, trachea midline and thyroid not enlarged, symmetric, no tenderness/mass/nodules Back: symmetric, no curvature. ROM normal. No CVA tenderness. Lungs: clear to auscultation bilaterally Heart: regular rate and rhythm, S1, S2 normal, no murmur, click, rub or gallop Abdomen: soft, non-tender; bowel sounds normal; no masses,  no organomegaly Extremities: extremities normal, atraumatic, no cyanosis or edema Pulses: 2+  and  symmetric Skin: Skin color, texture, turgor normal. No rashes or lesions Lymph nodes: Cervical adenopathy: nl and Axillary adenopathy: nl Neurologic: Alert and oriented X 3, normal strength and tone. Normal symmetric reflexes. Normal coordination and gait     Assessment:     Annual Wellness Exam       Plan:     During the course of the visit the patient was educated and counseled about appropriate screening and preventive services including:    Td vaccine  Screening mammography  Colorectal cancer screening - had colonoscopy at age 54 at [facility].    Due for labwork.  Screen HIV, Hep C.   Says has noticed some blood in the urine in the AM for months.   Says feels she is hydrated.  Does have some right sided back pain but has back problems even after her back surgery.  Will check U/A and micro. If neg then consider first AM urine collection.   HTN - uncontrolled. She wants to wait until her appt with cardiology before adjusting her medications.   Enlarged  Thyroid on exam today. Will check a TSH and order a ultrasound.  Diet review for nutrition referral? Yes ____  Not Indicated __X_   Patient Instructions (the written plan) was given to the patient.  Medicare Attestation I have personally reviewed: The patient's medical and social history Their use of alcohol, tobacco or illicit drugs Their current medications and supplements The patient's functional ability including ADLs,fall risks, home safety risks, cognitive, and hearing and visual impairment Diet and physical activities Evidence for depression or mood disorders  The patient's weight, height, BMI, and visual acuity have been recorded in the chart.  I have made referrals, counseling, and provided education to the patient based on review of the above and I have provided the patient with a written personalized care plan for preventive services.     [name],CATHERINE, MD   12/21/2014

## 2014-12-22 DIAGNOSIS — F319 Bipolar disorder, unspecified: Secondary | ICD-10-CM | POA: Diagnosis not present

## 2014-12-22 DIAGNOSIS — Z681 Body mass index (BMI) 19 or less, adult: Secondary | ICD-10-CM | POA: Diagnosis not present

## 2014-12-22 LAB — HIV ANTIBODY (ROUTINE TESTING W REFLEX): HIV: NONREACTIVE

## 2014-12-22 LAB — TSH: TSH: 0.676 u[IU]/mL (ref 0.350–4.500)

## 2014-12-23 ENCOUNTER — Telehealth: Payer: Self-pay | Admitting: *Deleted

## 2014-12-23 NOTE — Telephone Encounter (Signed)
-----   Message from Hali Marry, MD sent at 12/23/2014  7:51 AM EDT ----- Call pt: thyroid is great!

## 2014-12-23 NOTE — Telephone Encounter (Signed)
Left VM concerning lab results..Direce

## 2014-12-26 DIAGNOSIS — Z681 Body mass index (BMI) 19 or less, adult: Secondary | ICD-10-CM | POA: Diagnosis not present

## 2014-12-26 DIAGNOSIS — F319 Bipolar disorder, unspecified: Secondary | ICD-10-CM | POA: Diagnosis not present

## 2014-12-29 ENCOUNTER — Ambulatory Visit (INDEPENDENT_AMBULATORY_CARE_PROVIDER_SITE_OTHER): Payer: Federal, State, Local not specified - PPO

## 2014-12-29 ENCOUNTER — Ambulatory Visit: Payer: Federal, State, Local not specified - PPO

## 2014-12-29 DIAGNOSIS — E049 Nontoxic goiter, unspecified: Secondary | ICD-10-CM

## 2014-12-29 DIAGNOSIS — E042 Nontoxic multinodular goiter: Secondary | ICD-10-CM | POA: Diagnosis not present

## 2014-12-29 DIAGNOSIS — E041 Nontoxic single thyroid nodule: Secondary | ICD-10-CM

## 2014-12-29 DIAGNOSIS — E010 Iodine-deficiency related diffuse (endemic) goiter: Secondary | ICD-10-CM

## 2014-12-29 DIAGNOSIS — E011 Iodine-deficiency related multinodular (endemic) goiter: Secondary | ICD-10-CM | POA: Diagnosis not present

## 2014-12-29 DIAGNOSIS — Z1231 Encounter for screening mammogram for malignant neoplasm of breast: Secondary | ICD-10-CM

## 2015-01-04 ENCOUNTER — Other Ambulatory Visit: Payer: Self-pay | Admitting: *Deleted

## 2015-01-04 DIAGNOSIS — E049 Nontoxic goiter, unspecified: Secondary | ICD-10-CM

## 2015-01-04 DIAGNOSIS — E041 Nontoxic single thyroid nodule: Secondary | ICD-10-CM

## 2015-01-05 ENCOUNTER — Telehealth: Payer: Self-pay | Admitting: Family Medicine

## 2015-01-05 NOTE — Telephone Encounter (Signed)
 Imaging called [phone]) to inform us they have been trying to contact Pt to schedule her imaging appt. They can not get a hold of her, and left a message. If Pt contact us, get a current phone number for the patient.

## 2015-01-18 ENCOUNTER — Encounter: Payer: Self-pay | Admitting: Cardiology

## 2015-01-18 ENCOUNTER — Ambulatory Visit (INDEPENDENT_AMBULATORY_CARE_PROVIDER_SITE_OTHER): Payer: Federal, State, Local not specified - PPO | Admitting: Cardiology

## 2015-01-18 ENCOUNTER — Encounter: Payer: Self-pay | Admitting: *Deleted

## 2015-01-18 DIAGNOSIS — I10 Essential (primary) hypertension: Secondary | ICD-10-CM | POA: Diagnosis not present

## 2015-01-18 DIAGNOSIS — R0600 Dyspnea, unspecified: Secondary | ICD-10-CM | POA: Diagnosis not present

## 2015-01-18 MED ORDER — HYDRALAZINE HCL 100 MG PO TABS: 100.0000 mg | ORAL_TABLET | Freq: Three times a day (TID) | ORAL | Status: DC

## 2015-01-18 NOTE — Patient Instructions (Signed)
Your physician wants you to follow-up in: [place] will receive a reminder letter in the mail two months in advance. If you don't receive a letter, please call our office to schedule the follow-up appointment.   INCREASE HYDRALAZINE TO 100 MG THREE TIMES DAILY= 2 OF THE 2 MG TABLETS THREE TIMES DAILY  Your physician has requested that you have an echocardiogram. Echocardiography is a painless test that uses sound waves to create images of your heart. It provides your doctor with information about the size and shape of your heart and how well your heart's chambers and valves are working. This procedure takes approximately one hour. There are no restrictions for this procedure.

## 2015-01-18 NOTE — Assessment & Plan Note (Signed)
Blood pressure elevated. Change hydralazine 100 mg by mouth 3 times a day and continue remaining medications. Follow blood pressure at home and adjust regimen as needed.

## 2015-01-18 NOTE — Assessment & Plan Note (Signed)
Patient does not appear to be volume overloaded on examination.check echocardiogram for LV function.

## 2015-01-18 NOTE — Progress Notes (Signed)
HPI: FU hypertension. Previous renal Dopplers were technically difficult. There was no stenosis on the left and the right was not well visualized. Follow up CTA to rule out renal artery stenosis was done and was negative. Echocardiogram in February of 2012 showed normal LV function. Last Myoview in November 2012 showed an ejection fraction of 71%, breast attenuation but no ischemia. Since last seen, she has some dyspnea on exertion and occasional orthopnea. This is long-standing. No pedal edema. Occasional "cramp" in her chest for 1-2 seconds but no exertional chest pain. No syncope.  Current Outpatient Prescriptions  Medication Sig Dispense Refill  . amLODipine (NORVASC) 10 MG tablet Take 1 tablet (10 mg total) by mouth daily. 30 tablet 0  . aspirin (ADULT ASPIRIN EC LOW STRENGTH) 81 MG EC tablet Take 81 mg by mouth daily.      . carvedilol (COREG) 25 MG tablet TAKE ONE TABLET BY MOUTH TWICE DAILY 60 tablet 5  . clonazePAM (KLONOPIN) 1 MG tablet Take 1 mg by mouth 3 (three) times daily.     . cloNIDine (CATAPRES) 0.2 MG tablet TAKE TWO TABLETS BY MOUTH THREE TIMES DAILY 180 tablet 3  . Cyanocobalamin (VITAMIN B-12 PO) Take 1 tablet by mouth daily.      . DULoxetine (CYMBALTA) 60 MG capsule Take 60 mg by mouth every evening.     . enalapril (VASOTEC) 20 MG tablet TAKE ONE TABLET BY MOUTH TWICE DAILY 60 tablet 5  . esomeprazole (NEXIUM) 40 MG capsule Take 40 mg by mouth 2 (two) times daily as needed. 1 capsule daily scheduled, will take an additional capsule if needed    . furosemide (LASIX) 20 MG tablet Take 1 tablet (20 mg total) by mouth daily. 30 tablet 5  . hydrALAZINE (APRESOLINE) 50 MG tablet Take 1 tablet (50 mg total) by mouth 4 (four) times daily. 120 tablet 0  . L-Methylfolate (DEPLIN) 7.5 MG TABS Take 1 tablet by mouth every evening.     . lamoTRIgine (LAMICTAL) 25 MG tablet Take 25 mg by mouth every evening.     . Multiple Vitamins-Minerals (MULTIVITAMINS THER. W/MINERALS) TABS  Take 1 tablet by mouth daily.      [name] omeprazole (PRILOSEC) 20 MG capsule Take 20 mg by mouth daily.    . pravastatin (PRAVACHOL) 20 MG tablet Take 20 mg by mouth daily.    [name] spironolactone (ALDACTONE) 50 MG tablet Take 1 tablet (50 mg total) by mouth daily. 30 tablet 0  . VENTOLIN HFA 108 (90 BASE) MCG/ACT inhaler prn     No current facility-administered medications for this visit.     Past Medical History  Diagnosis Date  . HLD (hyperlipidemia)     nec/nos  . HTN (hypertension)     benign essential  . Narcolepsy   . Glucose intolerance (impaired glucose tolerance)   . Chronic diastolic heart failure     hospitalized at [facility]  . Bipolar disorder   . Suicide attempt     hx  . Syncope     admx to [facility] in 2/12: Echo with normal LVF; head CT unremarkable, ECG stable, no further w/u  . Complication of anesthesia     had "breathing problems and ended up in ICU" 03/2010 report on chart  . CHF (congestive heart failure)   . MVP (mitral valve prolapse)   . DDD (degenerative disc disease), lumbar   . GERD (gastroesophageal reflux disease)   . Ulcer   . 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 Partial hysterectomy    . Bladder suspension    . Gastric bypass    . Repair peroneal tendons ankle    . Lumbar laminectomy/decompression microdiscectomy  09/11/2011    Procedure: LUMBAR LAMINECTOMY/DECOMPRESSION MICRODISCECTOMY;  Surgeon: Tobi Bastos;  Location: [facility];  Service: Orthopedics;  Laterality: Right;  Hemi Laminectomy/Microdiscectomy L5 - S1 on the Right (X-Ray)    History   Social History  . Marital Status: Married    Spouse Name: N/A  . Number of Children: N/A  . Years of Education: N/A   Occupational History  . Not on file.   Social History Main Topics  . Smoking status: Former Smoker    Quit date: 10/08/2003  . Smokeless tobacco: Not on file  . Alcohol Use: Yes     Comment: rare  .  Drug Use: No  . Sexual Activity: Not on file   Other Topics Concern  . Not on file   Social History Narrative   Regularly exercises.     ROS: no fevers or chills, productive cough, hemoptysis, dysphasia, odynophagia, melena, hematochezia, dysuria, hematuria, rash, seizure activity, orthopnea, PND, pedal edema, claudication. Remaining systems are negative.  Physical Exam: Well-developed well-nourished in no acute distress.  Skin is warm and dry.  HEENT is normal.  Neck is supple.  Chest is clear to auscultation with normal expansion.  Cardiovascular exam is regular rate and rhythm.  Abdominal exam nontender or distended. No masses palpated. Extremities show no edema. neuro grossly intact  ECG sinus rhythm at a rate of 74. Left ventricular hypertrophy with repolarization abnormality.

## 2015-01-18 NOTE — Assessment & Plan Note (Signed)
Continue statin. Managed by primary care.

## 2015-01-24 ENCOUNTER — Other Ambulatory Visit (HOSPITAL_COMMUNITY)
Admission: RE | Admit: 2015-01-24 | Discharge: 2015-01-24 | Disposition: A | Discharge status: Home / Self Care | Payer: Federal, State, Local not specified - PPO | Source: Ambulatory Visit | Attending: Interventional Radiology | Admitting: Interventional Radiology

## 2015-01-24 ENCOUNTER — Ambulatory Visit
Admission: RE | Admit: 2015-01-24 | Discharge: 2015-01-24 | Disposition: A | Discharge status: Home / Self Care | Payer: Federal, State, Local not specified - PPO | Source: Ambulatory Visit | Attending: Family Medicine | Admitting: Family Medicine

## 2015-01-24 DIAGNOSIS — E041 Nontoxic single thyroid nodule: Secondary | ICD-10-CM | POA: Insufficient documentation

## 2015-01-24 DIAGNOSIS — E042 Nontoxic multinodular goiter: Secondary | ICD-10-CM | POA: Diagnosis not present

## 2015-01-24 DIAGNOSIS — E049 Nontoxic goiter, unspecified: Secondary | ICD-10-CM

## 2015-02-02 DIAGNOSIS — F319 Bipolar disorder, unspecified: Secondary | ICD-10-CM | POA: Diagnosis not present

## 2015-02-06 ENCOUNTER — Ambulatory Visit (HOSPITAL_COMMUNITY): Payer: Federal, State, Local not specified - PPO

## 2015-02-06 ENCOUNTER — Ambulatory Visit (HOSPITAL_COMMUNITY)
Admission: RE | Admit: 2015-02-06 | Discharge: 2015-02-06 | Disposition: A | Discharge status: Home / Self Care | Payer: Federal, State, Local not specified - PPO | Source: Ambulatory Visit | Attending: Cardiovascular Disease | Admitting: Cardiovascular Disease

## 2015-02-06 DIAGNOSIS — R0600 Dyspnea, unspecified: Secondary | ICD-10-CM | POA: Insufficient documentation

## 2015-02-07 DIAGNOSIS — F319 Bipolar disorder, unspecified: Secondary | ICD-10-CM | POA: Diagnosis not present

## 2015-02-27 DIAGNOSIS — F319 Bipolar disorder, unspecified: Secondary | ICD-10-CM | POA: Diagnosis not present

## 2015-03-07 ENCOUNTER — Other Ambulatory Visit: Payer: Self-pay

## 2015-03-07 MED ORDER — CARVEDILOL 25 MG PO TABS: 25.0000 mg | ORAL_TABLET | Freq: Two times a day (BID) | ORAL | Status: DC

## 2015-03-07 MED ORDER — ENALAPRIL MALEATE 20 MG PO TABS: 20.0000 mg | ORAL_TABLET | Freq: Two times a day (BID) | ORAL | Status: DC

## 2015-04-27 DIAGNOSIS — F319 Bipolar disorder, unspecified: Secondary | ICD-10-CM | POA: Diagnosis not present

## 2015-05-18 DIAGNOSIS — F319 Bipolar disorder, unspecified: Secondary | ICD-10-CM | POA: Diagnosis not present

## 2015-07-13 DIAGNOSIS — F319 Bipolar disorder, unspecified: Secondary | ICD-10-CM | POA: Diagnosis not present

## 2015-08-06 ENCOUNTER — Other Ambulatory Visit: Payer: Self-pay | Admitting: Cardiology

## 2015-08-17 DIAGNOSIS — F319 Bipolar disorder, unspecified: Secondary | ICD-10-CM | POA: Diagnosis not present

## 2015-10-10 ENCOUNTER — Other Ambulatory Visit: Payer: Self-pay | Admitting: Physician Assistant

## 2016-02-08 DIAGNOSIS — I341 Nonrheumatic mitral (valve) prolapse: Secondary | ICD-10-CM | POA: Diagnosis not present

## 2016-02-08 DIAGNOSIS — F319 Bipolar disorder, unspecified: Secondary | ICD-10-CM | POA: Diagnosis not present

## 2016-02-08 DIAGNOSIS — I509 Heart failure, unspecified: Secondary | ICD-10-CM | POA: Diagnosis not present

## 2016-02-08 DIAGNOSIS — R040 Epistaxis: Secondary | ICD-10-CM | POA: Diagnosis not present

## 2016-02-08 DIAGNOSIS — M519 Unspecified thoracic, thoracolumbar and lumbosacral intervertebral disc disorder: Secondary | ICD-10-CM | POA: Diagnosis not present

## 2016-02-08 DIAGNOSIS — Z87891 Personal history of nicotine dependence: Secondary | ICD-10-CM | POA: Diagnosis not present

## 2016-02-08 DIAGNOSIS — Z885 Allergy status to narcotic agent status: Secondary | ICD-10-CM | POA: Diagnosis not present

## 2016-02-08 DIAGNOSIS — E119 Type 2 diabetes mellitus without complications: Secondary | ICD-10-CM | POA: Diagnosis not present

## 2016-02-08 DIAGNOSIS — Z9884 Bariatric surgery status: Secondary | ICD-10-CM | POA: Diagnosis not present

## 2016-02-08 DIAGNOSIS — I10 Essential (primary) hypertension: Secondary | ICD-10-CM | POA: Diagnosis not present

## 2016-02-09 DIAGNOSIS — K219 Gastro-esophageal reflux disease without esophagitis: Secondary | ICD-10-CM | POA: Diagnosis not present

## 2016-02-09 DIAGNOSIS — R51 Headache: Secondary | ICD-10-CM | POA: Diagnosis not present

## 2016-02-09 DIAGNOSIS — R040 Epistaxis: Secondary | ICD-10-CM | POA: Diagnosis not present

## 2016-02-09 DIAGNOSIS — Z885 Allergy status to narcotic agent status: Secondary | ICD-10-CM | POA: Diagnosis not present

## 2016-02-09 DIAGNOSIS — I10 Essential (primary) hypertension: Secondary | ICD-10-CM | POA: Diagnosis not present

## 2016-02-09 DIAGNOSIS — Z79899 Other long term (current) drug therapy: Secondary | ICD-10-CM | POA: Diagnosis not present

## 2016-02-12 ENCOUNTER — Other Ambulatory Visit: Payer: Self-pay | Admitting: Cardiology

## 2016-02-12 ENCOUNTER — Telehealth: Payer: Self-pay | Admitting: *Deleted

## 2016-02-12 DIAGNOSIS — R040 Epistaxis: Secondary | ICD-10-CM | POA: Diagnosis not present

## 2016-02-12 DIAGNOSIS — J019 Acute sinusitis, unspecified: Secondary | ICD-10-CM | POA: Diagnosis not present

## 2016-02-12 NOTE — Telephone Encounter (Signed)
Patient called and states she needed a referral to an ENT. She states she was seen in th ER over the weekend for a nose bleed and had a "ballon"placed in her nose.. Called the patient back and she states she was able to get an appointment with an ENT and then our call got disconnected. Called the patient back and got the voicemail.On the voice mail left message for her to call office back and schedule a 30 min f/u ER appointment with Dr. Madilyn Fireman.

## 2016-02-12 NOTE — Telephone Encounter (Signed)
REFILL 

## 2016-02-29 ENCOUNTER — Encounter: Payer: Self-pay | Admitting: Family Medicine

## 2016-02-29 ENCOUNTER — Ambulatory Visit (INDEPENDENT_AMBULATORY_CARE_PROVIDER_SITE_OTHER): Payer: Federal, State, Local not specified - PPO | Admitting: Family Medicine

## 2016-02-29 VITALS — BP 140/82 | HR 59 | Wt 185.0 lb

## 2016-02-29 DIAGNOSIS — R51 Headache: Secondary | ICD-10-CM

## 2016-02-29 DIAGNOSIS — I10 Essential (primary) hypertension: Secondary | ICD-10-CM | POA: Diagnosis not present

## 2016-02-29 DIAGNOSIS — R040 Epistaxis: Secondary | ICD-10-CM

## 2016-02-29 DIAGNOSIS — R519 Headache, unspecified: Secondary | ICD-10-CM

## 2016-02-29 MED ORDER — HYDRALAZINE HCL 100 MG PO TABS: 100.0000 mg | ORAL_TABLET | Freq: Three times a day (TID) | ORAL | Status: DC

## 2016-02-29 MED ORDER — FUROSEMIDE 20 MG PO TABS: 20.0000 mg | ORAL_TABLET | Freq: Every day | ORAL | Status: DC

## 2016-02-29 NOTE — Patient Instructions (Signed)
Try to get in with Dr. Stanford Breed.   We will call for labs.

## 2016-02-29 NOTE — Progress Notes (Signed)
Subjective:    CC: Nosebleed, rightnostril.    HPI:  Here today for emergency department follow-up for nosebleed. It evidently started on May 4. She went to the ED and they put packing in place. She then went back because she wanted to have it removed because she says that was causing facial pain. And actually placed a balloon and just decrease the amount of air and it to make her more comfortable. She had again for about a week. Of note, her blood pressure was high when she was at the emergency department as well. She had not taken her medications that day. She did follow back up with ENT and they did see a blood vessel that was still oozing but decided not to cauterize it. Fortunately, she has not had a nosebleed since then.  She has been having left sided headaches.  Says using BC powder and Tylenol.  Started after the nosebleeds. She rates the headache a 6 out of 10 and says it's more of a dull ache. No throbbing. She says she doesn't usually get headaches of this is very unusual for her and is also on the right side which is the side that she had a nosebleed on. He denies any visual changes. It primarily is over the right eye and right temple. No other worsening or alleviating factors.  Past medical history, Surgical history, Family history not pertinant except as noted below, Social history, Allergies, and medications have been entered into the medical record, reviewed, and corrections made.   Review of Systems: No fevers, chills, night sweats, weight loss, chest pain, or shortness of breath.   Objective:    General: Well Developed, well nourished, and in no acute distress.  Neuro: Alert and oriented x3, extra-ocular muscles intact, sensation grossly intact.  HEENT: Normocephalic, atraumatic  Skin: Warm and dry, no rashes. Cardiac: Regular rate and rhythm, no murmurs rubs or gallops, no lower extremity edema.  Respiratory: Clear to auscultation bilaterally. Not using accessory muscles,  speaking in full sentences.   Impression and Recommendations:   Hypertension-uncontrolled-   though much improved compared to the day that she went to the emergency department. She does need a refill on her Lasix and her hydralazine. She says she plans to make her follow-up with Dr. Stanford Breed next month. Encouraged her to call.    Left-sided headaches-unclear etiology at this point. We'll do vision screening. She does have a strong family history of cerebral aneurysm including her father. She is very concerned about this. We'll see if we can get her set up for CT with contrast.   Epistaxis-resolved. Did follow up with ENT.No recurrence.

## 2016-03-09 ENCOUNTER — Emergency Department (INDEPENDENT_AMBULATORY_CARE_PROVIDER_SITE_OTHER): Payer: Federal, State, Local not specified - PPO

## 2016-03-09 ENCOUNTER — Emergency Department (INDEPENDENT_AMBULATORY_CARE_PROVIDER_SITE_OTHER)
Admission: EM | Admit: 2016-03-09 | Discharge: 2016-03-09 | Disposition: A | Discharge status: Home / Self Care | Payer: Federal, State, Local not specified - PPO | Source: Home / Self Care | Attending: Family Medicine | Admitting: Family Medicine

## 2016-03-09 ENCOUNTER — Encounter: Payer: Self-pay | Admitting: Emergency Medicine

## 2016-03-09 DIAGNOSIS — R030 Elevated blood-pressure reading, without diagnosis of hypertension: Secondary | ICD-10-CM

## 2016-03-09 DIAGNOSIS — R05 Cough: Secondary | ICD-10-CM

## 2016-03-09 DIAGNOSIS — J069 Acute upper respiratory infection, unspecified: Secondary | ICD-10-CM | POA: Diagnosis not present

## 2016-03-09 DIAGNOSIS — J029 Acute pharyngitis, unspecified: Secondary | ICD-10-CM | POA: Diagnosis not present

## 2016-03-09 DIAGNOSIS — H65191 Other acute nonsuppurative otitis media, right ear: Secondary | ICD-10-CM | POA: Diagnosis not present

## 2016-03-09 DIAGNOSIS — IMO0001 Reserved for inherently not codable concepts without codable children: Secondary | ICD-10-CM

## 2016-03-09 MED ORDER — HYDROCODONE-ACETAMINOPHEN 5-325 MG PO TABS: 1.0000 | ORAL_TABLET | Freq: Four times a day (QID) | ORAL | Status: DC | PRN

## 2016-03-09 MED ORDER — DOXYCYCLINE HYCLATE 100 MG PO CAPS
100.0000 mg | ORAL_CAPSULE | Freq: Two times a day (BID) | ORAL | Status: DC
Start: 2016-03-09 — End: 2016-03-19

## 2016-03-09 MED ORDER — FLUCONAZOLE 150 MG PO TABS: 150.0000 mg | ORAL_TABLET | Freq: Once | ORAL | Status: DC

## 2016-03-09 MED ORDER — PREDNISONE 20 MG PO TABS: ORAL_TABLET | ORAL | Status: DC

## 2016-03-09 NOTE — Discharge Instructions (Signed)
Norco/Vicodin (hydrocodone-acetaminophen) is a narcotic pain medication, do not combine these medications with others containing tylenol. While taking, do not drink alcohol, drive, or perform any other activities that requires focus while taking these medications.   You may take 400-600mg  Ibuprofen (Motrin) every 6-8 hours for fever and pain  Alternate with Tylenol  You may take 500mg  Tylenol every 4-6 hours as needed for fever and pain  Follow-up with your primary care provider next week for recheck of symptoms if not improving.  Be sure to drink plenty of fluids and rest, at least 8hrs of sleep a night, preferably more while you are sick. Return urgent care or go to closest ER if you cannot keep down fluids/signs of dehydration, fever not reducing with Tylenol, difficulty breathing/wheezing, stiff neck, worsening condition, or other concerns (see below)  Please take antibiotics as prescribed and be sure to complete entire course even if you start to feel better to ensure infection does not come back.   Cool Mist Vaporizers Vaporizers may help relieve the symptoms of a cough and cold. They add moisture to the air, which helps mucus to become thinner and less sticky. This makes it easier to breathe and cough up secretions. Cool mist vaporizers do not cause serious burns like hot mist vaporizers, which may also be called steamers or humidifiers. Vaporizers have not been proven to help with colds. You should not use a vaporizer if you are allergic to mold. HOME CARE INSTRUCTIONS  Follow the package instructions for the vaporizer.  Do not use anything other than distilled water in the vaporizer.  Do not run the vaporizer all of the time. This can cause mold or bacteria to grow in the vaporizer.  Clean the vaporizer after each time it is used.  Clean and dry the vaporizer well before storing it.  Stop using the vaporizer if worsening respiratory symptoms develop.   This information is not  intended to replace advice given to you by your health care provider. Make sure you discuss any questions you have with your health care provider.   Document Released: 06/20/2004 Document Revised: 09/28/2013 Document Reviewed: 02/10/2013 Elsevier Interactive Patient Education [employer].

## 2016-03-09 NOTE — ED Provider Notes (Signed)
CSN: [id number]     Arrival date & time 03/09/16  1208 History   First MD Initiated Contact with Patient 03/09/16 1232     Chief Complaint  Patient presents with  . Cough  . URI   (Consider location/radiation/quality/duration/timing/severity/associated sxs/prior Treatment) HPI  The pt is a 60yo female with hx of HTN c/o cough, congestion, sore throat and Right ear pain that started 2 days ago after she was exposed to mold.  Right ear pain is aching and throbbing as well as throat pain, 6/10 at this time. She reports being on Keflex for about 5 days following nasal packing from an episode of epistaxis believed to be due to her elevated blood pressure.  Her ENT placed her on the Keflex.  Her PCP has scheduled an MRI of her head for Monday 6/5 for reported ear pain and the recent nosebleed.  Denies fever, n/v/d. She has taken benadryl and used OTC chloroseptic spray w/o relief. Denies sick contacts.  BP elevated in triage- 172/118. Pt denies headache, only Right sided ear pain and throat pain.  She notes she is due for 2 of her BP medications around lunch time.  Denies chest pain or SOB. Denies dizziness or additional nosebleeds since last one about 1 month ago.   Past Medical History  Diagnosis Date  . HLD (hyperlipidemia)     nec/nos  . HTN (hypertension)     benign essential  . Narcolepsy   . Glucose intolerance (impaired glucose tolerance)   . Chronic diastolic heart failure (HCC)     hospitalized at [facility]  . Bipolar disorder ([place])   . Suicide attempt ([place])     hx  . Syncope     admx to [facility] in 2/12: Echo with normal LVF; head CT unremarkable, ECG stable, no further w/u  . Complication of anesthesia     had "breathing problems and ended up in ICU" 03/2010 report on chart  . CHF (congestive heart failure) ([place])   . MVP (mitral valve prolapse)   . DDD (degenerative disc disease), lumbar   . GERD (gastroesophageal reflux disease)   . Ulcer   . Diabetes in pregnancy  ([place])     diet cnontrolled   Past Surgical History  Procedure Laterality Date  . Partial hysterectomy    . Bladder suspension    . Gastric bypass    . Repair peroneal tendons ankle    . Lumbar laminectomy/decompression microdiscectomy  09/11/2011    Procedure: LUMBAR LAMINECTOMY/DECOMPRESSION MICRODISCECTOMY;  Surgeon: Tobi Bastos;  Location: [facility];  Service: Orthopedics;  Laterality: Right;  Hemi Laminectomy/Microdiscectomy L5 - S1 on the Right (X-Ray)   Family History  Problem Relation Age of Onset  . Diabetes Mother   . Hypertension Mother   . Heart attack Father 94  . Hypertension Father   . Stroke Other     grandmother   Social History  Substance Use Topics  . Smoking status: Former Smoker    Quit date: 10/08/2003  . Smokeless tobacco: None  . Alcohol Use: Yes     Comment: rare   OB History    No data available     Review of Systems  Constitutional: Negative for fever and chills.  HENT: Positive for congestion, ear pain (Right) and sore throat. Negative for trouble swallowing and voice change.   Respiratory: Positive for cough. Negative for shortness of breath.   Cardiovascular: Negative for chest pain and palpitations.  Gastrointestinal: Negative for nausea, vomiting,  abdominal pain and diarrhea.  Musculoskeletal: Negative for myalgias, back pain and arthralgias.  Skin: Negative for rash.  Neurological: Negative for dizziness, light-headedness and headaches.    Allergies  Codeine  Home Medications   Prior to Admission medications   Medication Sig Start Date End Date Taking? Authorizing Provider  amLODipine (NORVASC) 10 MG tablet Take 1 tablet (10 mg total) by mouth daily. 10/04/13 01/18/15  Lelon Perla, MD  amLODipine-olmesartan (AZOR) 10-40 MG tablet Take by mouth. 03/06/10   Historical Provider, MD  aspirin (ADULT ASPIRIN EC LOW STRENGTH) 81 MG EC tablet Take 81 mg by mouth daily.      Historical Provider, MD  carvedilol (COREG) 25 MG tablet Take  1 tablet (25 mg total) by mouth 2 (two) times daily. 03/07/15   Lelon Perla, MD  clonazePAM (KLONOPIN) 1 MG tablet Take 1 mg by mouth 3 (three) times daily.     Historical Provider, MD  cloNIDine (CATAPRES) 0.2 MG tablet Take 2 tablets (0.4 mg total) by mouth 3 (three) times daily. NEED OV. 02/12/16   Lelon Perla, MD  doxycycline (VIBRAMYCIN) 100 MG capsule Take 1 capsule (100 mg total) by mouth 2 (two) times daily. One po bid x 7 days 03/09/16   Noland Fordyce, PA-C  DULoxetine (CYMBALTA) 60 MG capsule Take 60 mg by mouth every evening.     Historical Provider, MD  enalapril (VASOTEC) 20 MG tablet Take 1 tablet (20 mg total) by mouth 2 (two) times daily. 03/07/15   Lelon Perla, MD  esomeprazole (NEXIUM) 40 MG capsule Take 40 mg by mouth 2 (two) times daily as needed. 1 capsule daily scheduled, will take an additional capsule if needed    Historical Provider, MD  fluconazole (DIFLUCAN) 150 MG tablet Take 1 tablet (150 mg total) by mouth once. 03/09/16   Noland Fordyce, PA-C  furosemide (LASIX) 20 MG tablet Take 1 tablet (20 mg total) by mouth daily. 02/29/16   Hali Marry, MD  hydrALAZINE (APRESOLINE) 100 MG tablet Take 1 tablet (100 mg total) by mouth 3 (three) times daily. 02/29/16   Hali Marry, MD  HYDROcodone-acetaminophen (NORCO/VICODIN) 5-325 MG tablet Take 1 tablet by mouth every 6 (six) hours as needed. 03/09/16   Noland Fordyce, PA-C  Multiple Vitamins-Minerals (MULTIVITAMINS THER. W/MINERALS) TABS Take 1 tablet by mouth daily.      Historical Provider, MD  omeprazole (PRILOSEC) 20 MG capsule Take 20 mg by mouth daily.    Historical Provider, MD  predniSONE (DELTASONE) 20 MG tablet 3 tabs po day one, then 2 po daily x 4 days 03/09/16   Noland Fordyce, PA-C  spironolactone (ALDACTONE) 50 MG tablet Take 1 tablet (50 mg total) by mouth daily. 10/04/13   Lelon Perla, MD  VENTOLIN HFA 108 (90 BASE) MCG/ACT inhaler prn 11/10/11   Historical Provider, MD   Meds Ordered and  Administered this Visit  Medications - No data to display  BP 153/110 mmHg  Pulse 78  Temp(Src) 98.5 F (36.9 C) (Oral)  Resp 16  Ht 5\' 6"  (1.676 m)  Wt 182 lb (82.555 kg)  BMI 29.39 kg/m2  SpO2 97% No data found.   Physical Exam  Constitutional: She is oriented to person, place, and time. She appears well-developed and well-nourished. No distress.  HENT:  Head: Normocephalic and atraumatic.  Right Ear: No mastoid tenderness. Tympanic membrane is erythematous and bulging.  Left Ear: Tympanic membrane normal.  Nose: Nose normal. Right sinus exhibits no maxillary sinus  tenderness and no frontal sinus tenderness. Left sinus exhibits no maxillary sinus tenderness and no frontal sinus tenderness.  Mouth/Throat: Uvula is midline and mucous membranes are normal. Posterior oropharyngeal erythema present. No oropharyngeal exudate, posterior oropharyngeal edema or tonsillar abscesses.  Eyes: Conjunctivae are normal. No scleral icterus.  Neck: Normal range of motion. Neck supple.  Cardiovascular: Normal rate, regular rhythm and normal heart sounds.   Pulmonary/Chest: Effort normal and breath sounds normal. No respiratory distress. She has no wheezes. She has no rales. She exhibits no tenderness.  Abdominal: Soft. She exhibits no distension. There is no tenderness.  Musculoskeletal: Normal range of motion.  Neurological: She is alert and oriented to person, place, and time. No cranial nerve deficit. Coordination normal.  Skin: Skin is warm and dry. She is not diaphoretic.  Nursing note and vitals reviewed.   ED Course  Procedures (including critical care time)  Labs Review Labs Reviewed - No data to display  Imaging Review Dg Chest 2 View  03/09/2016  CLINICAL DATA:  Cough, runny nose, congestion, nasal drip, and headache after being exposed to mold 2 days ago. EXAM: CHEST  2 VIEW COMPARISON:  08/15/2013 FINDINGS: The cardiomediastinal silhouette is within normal limits. The lungs are  well inflated. No airspace consolidation, edema, pleural effusion, or pneumothorax is identified. No acute osseous abnormality is seen. IMPRESSION: No active cardiopulmonary disease. Electronically Signed   By: Logan Bores M.D.   On: 03/09/2016 12:50      MDM   1. Acute nonsuppurative otitis media of right ear   2. Sore throat   3. Acute upper respiratory infection   4. Elevated blood pressure    Exam c/w Right AOM. Elevated BP in triage and with recheck. Pt denies feeling symptoms of her elevated BP. No focal neuro deficit. Pt also due for 2 of her BP medications but does not have them with her.    Will add Doxycyline, prednisone, and norco for pain of ear infection.  Prescription to hold for diflucan as pt notes she gets yeast infections when on antibiotics. May continue with the Keflex prescribed by her ENT. Encouraged to f/u with MRI 2 day as previously scheduled.  Encouraged to monitor BP today and tomorrow after taking her BP medications.  Discussed symptoms that warrant emergent care in the ED. Patient verbalized understanding and agreement with treatment plan.     Noland Fordyce, PA-C 03/09/16 1535

## 2016-03-09 NOTE — ED Notes (Signed)
Patient states that she was exposed to mold two days ago and since that time she has had cough runny nose congestion post nasal drip and headache.

## 2016-03-11 ENCOUNTER — Ambulatory Visit (INDEPENDENT_AMBULATORY_CARE_PROVIDER_SITE_OTHER): Payer: Federal, State, Local not specified - PPO

## 2016-03-11 ENCOUNTER — Telehealth: Payer: Self-pay | Admitting: Emergency Medicine

## 2016-03-11 DIAGNOSIS — R519 Headache, unspecified: Secondary | ICD-10-CM

## 2016-03-11 DIAGNOSIS — I639 Cerebral infarction, unspecified: Secondary | ICD-10-CM

## 2016-03-11 DIAGNOSIS — R51 Headache: Principal | ICD-10-CM

## 2016-03-13 ENCOUNTER — Encounter: Payer: Self-pay | Admitting: Family Medicine

## 2016-03-13 DIAGNOSIS — Z8673 Personal history of transient ischemic attack (TIA), and cerebral infarction without residual deficits: Secondary | ICD-10-CM | POA: Insufficient documentation

## 2016-03-18 ENCOUNTER — Other Ambulatory Visit: Payer: Self-pay

## 2016-03-18 ENCOUNTER — Telehealth: Payer: Self-pay

## 2016-03-18 ENCOUNTER — Encounter: Payer: Self-pay | Admitting: *Deleted

## 2016-03-18 ENCOUNTER — Other Ambulatory Visit: Payer: Self-pay | Admitting: Cardiology

## 2016-03-18 DIAGNOSIS — R519 Headache, unspecified: Secondary | ICD-10-CM

## 2016-03-18 DIAGNOSIS — G8929 Other chronic pain: Secondary | ICD-10-CM

## 2016-03-18 DIAGNOSIS — R51 Headache: Principal | ICD-10-CM

## 2016-03-18 MED ORDER — AMLODIPINE-OLMESARTAN 10-40 MG PO TABS: 1.0000 | ORAL_TABLET | Freq: Every day | ORAL | Status: DC

## 2016-03-18 MED ORDER — SPIRONOLACTONE 50 MG PO TABS: 50.0000 mg | ORAL_TABLET | Freq: Every day | ORAL | Status: DC

## 2016-03-18 NOTE — Telephone Encounter (Signed)
Rx request sent to pharmacy.  

## 2016-03-18 NOTE — Telephone Encounter (Signed)
If she still having headaches and vision changes then we need to get her in with neurology. Okay to place referral. You can see if she has a preference for location.

## 2016-03-19 ENCOUNTER — Ambulatory Visit (INDEPENDENT_AMBULATORY_CARE_PROVIDER_SITE_OTHER): Payer: Federal, State, Local not specified - PPO | Admitting: Osteopathic Medicine

## 2016-03-19 VITALS — BP 161/96 | HR 72 | Wt 187.0 lb

## 2016-03-19 DIAGNOSIS — H9201 Otalgia, right ear: Secondary | ICD-10-CM

## 2016-03-19 DIAGNOSIS — H6981 Other specified disorders of Eustachian tube, right ear: Secondary | ICD-10-CM

## 2016-03-19 MED ORDER — IPRATROPIUM BROMIDE 0.03 % NA SOLN: 2.0000 | Freq: Two times a day (BID) | NASAL | Status: DC | PRN

## 2016-03-19 NOTE — Patient Instructions (Signed)
IF NO BETTER IN THE NEXT FEW DAYS, YOU WILL NEED TO FOLLOW UP WITH YOUR E.N.T.  I DO NOT SEE ANY INFECTION IN THE EAR. THERE MAY BE A DEEPER SINUS INFECTION BUT THE ANTIBIOTICS YOU WERE RECENTLY TAKING SHOULD HAVE TREATED THAT. THERE MAY BE A BLOCKAGE OF THE EUSTACHIAN TUBE, THIS DRAINS THE MIDDLE EAR AND CAN CAUSE PRESSURE AND FULLNESS FEELING IF BLOCKED. WE MAY NEED TO DO A CT SCAN OF THE SINUSES TO MAKE SURE THERE I SNO INFECTION OR POLYP OR OTHER DEEPER PROBLEM - IF YOU'RE UNABLE TO BE SEEN BY E.N.T. PLEASE LET us KNOW AND WE CAN ORDER THE CT SCAN TO BE DONE HERE.   IN THE MEANTIME, TRY ANTIHISTAMINE SUCH AS CLARITIN, ZYRTEC, ALLEGRA OR ONE OF THE GENERIC EQUIVALENTS, CAN TRY SALINE NASAL SPRAY. I'VE CALLED IN ATROVENT NASAL SPRAY WHICH MAY HELP RELIEVE CONGESTION BUT CAN CAUSE NOSEBLEEDS IF USED TOO MUCH, YOU CAN TRY THIS MEDICINE AND SEE IF IT MAKES A DIFFERENCE.

## 2016-03-19 NOTE — Progress Notes (Signed)
HPI: [name] is a 60 y.o. female who presents to [facility] today for chief complaint of:  Chief Complaint  Patient presents with  . Cough  . Ear Pain    right     . Location: R ear  . Quality: feels like pressure on airplane  . Duration: 02/08/16 nosebleed, 02/09/16 ER  . Modifying factors: recently in ER 02/09/16 for epistaxis, packing removed by ENT 5 days later (?) and they started antibiotics (she hasn't seen them since), then was seen by UC for ear infection - so was on Keflex (ENT) and Doxy (UC) finished abx 4 days ago.  . 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 HLD (hyperlipidemia)     nec/nos  . HTN (hypertension)     benign essential  . Narcolepsy   . Glucose intolerance (impaired glucose tolerance)   . Chronic diastolic heart failure (HCC)     hospitalized at [facility]  . Bipolar disorder ([place])   . Suicide attempt ([place])     hx  . Syncope     admx to [facility] in 2/12: Echo with normal LVF; head CT unremarkable, ECG stable, no further w/u  . Complication of anesthesia     had "breathing problems and ended up in ICU" 03/2010 report on chart  . CHF (congestive heart failure) ([place])   . MVP (mitral valve prolapse)   . DDD (degenerative disc disease), lumbar   . GERD (gastroesophageal reflux disease)   . Ulcer   . Diabetes in pregnancy ([place])     diet cnontrolled   Past Surgical History  Procedure Laterality Date  . Partial hysterectomy    . Bladder suspension    . Gastric bypass    . Repair peroneal tendons ankle    . Lumbar laminectomy/decompression microdiscectomy  09/11/2011    Procedure: LUMBAR LAMINECTOMY/DECOMPRESSION MICRODISCECTOMY;  Surgeon: Tobi Bastos;  Location: [facility];  Service: Orthopedics;  Laterality: Right;  Hemi  Laminectomy/Microdiscectomy L5 - S1 on the Right (X-Ray)   Social History  Substance Use Topics  . Smoking status: Former Smoker    Quit date: 10/08/2003  . Smokeless tobacco: Not on file  . Alcohol Use: Yes     Comment: rare   Family History  Problem Relation Age of Onset  . Diabetes Mother   . Hypertension Mother   . Heart attack Father 5  . Hypertension Father   . Stroke Other     grandmother    Current Outpatient Prescriptions  Medication Sig Dispense Refill  . amLODipine-olmesartan (AZOR) 10-40 MG tablet Take 1 tablet by mouth daily. 90 tablet 0  . aspirin (ADULT ASPIRIN EC LOW STRENGTH) 81 MG EC tablet Take 81 mg by mouth daily.      . carvedilol (COREG) 25 MG tablet TAKE ONE TABLET BY MOUTH TWICE DAILY 60 tablet 2  . clonazePAM (KLONOPIN) 1 MG tablet Take 1 mg by mouth 3 (three) times daily.     . cloNIDine (CATAPRES) 0.2 MG tablet TAKE TWO TABLETS BY MOUTH THREE TIMES DAILY -  NEEDS  OFFICE  VISIT 180 tablet 0  . DULoxetine (CYMBALTA) 60 MG capsule Take 60 mg by mouth every evening.     . enalapril (VASOTEC) 20 MG tablet TAKE ONE TABLET BY MOUTH TWICE DAILY 60 tablet 2  .  esomeprazole (NEXIUM) 40 MG capsule Take 40 mg by mouth 2 (two) times daily as needed. 1 capsule daily scheduled, will take an additional capsule if needed    . furosemide (LASIX) 20 MG tablet Take 1 tablet (20 mg total) by mouth daily. 30 tablet 1  . hydrALAZINE (APRESOLINE) 100 MG tablet Take 1 tablet (100 mg total) by mouth 3 (three) times daily. 90 tablet 1  . HYDROcodone-acetaminophen (NORCO/VICODIN) 5-325 MG tablet Take 1 tablet by mouth every 6 (six) hours as needed. 6 tablet 0  . Multiple Vitamins-Minerals (MULTIVITAMINS THER. W/MINERALS) TABS Take 1 tablet by mouth daily.      [name] omeprazole (PRILOSEC) 20 MG capsule Take 20 mg by mouth daily.    [name] spironolactone (ALDACTONE) 50 MG tablet Take 1 tablet (50 mg total) by mouth daily. 30 tablet 0  . VENTOLIN HFA 108 (90 BASE) MCG/ACT inhaler prn    .  amLODipine (NORVASC) 10 MG tablet Take 1 tablet (10 mg total) by mouth daily. 30 tablet 0   No current facility-administered medications for this visit.   Allergies  Allergen Reactions  . Codeine Nausea And Vomiting      Review of Systems: CONSTITUTIONAL:  No  fever, no chills, No recent illness, No unintentional weight changes HEAD/EYES/EARS/NOSE/THROAT: (+) chronic R headache, no vision change, no hearing change, No sore throat, No  sinus pressure, (+) R ear discomfort as per HPI CARDIAC: No  chest pain, No  pressure, No palpitations RESPIRATORY: (+) chronic dry cough, No  shortness of breath/wheeze  Exam:  BP 161/96 mmHg  Pulse 72  Wt 187 lb (84.823 kg)  SpO2 98% Constitutional: VS see above. General Appearance: alert, well-developed, well-nourished, NAD Eyes: Normal lids and conjunctive, non-icteric sclera,  Ears, Nose, Mouth, Throat: MMM, Normal external inspection ears/nares/mouth/lips/gums, TM normal bilaterally. Pharynx/tonsils no erythema, no exudate. Nasal mucosa normal.  Neck: No masses, trachea midline. No thyroid enlargement. No tenderness/mass appreciated. No lymphadenopathy Respiratory: Normal respiratory effort. no wheeze, no rhonchi, no rales Cardiovascular: S1/S2 normal, no murmur, no rub/gallop auscultated. RRR. No lower extremity edema. Psychiatric: Normal judgment/insight. Normal mood and affect. Oriented x3.    No results found for this or any previous visit (from the past 72 hour(s)).  No results found.   ASSESSMENT/PLAN: Likely eustachian tube dysfunction, possible sinusitis but given recent 2 rounds abx would want CT sinus for further eval but patient declines this test. Concern for epistaxis recurrence with nasal spray so I wrote Rx atrovent she can try at low frequency, or try po antihistamines - see pt instructions. Need to follow up with ENT or get CT sinus if no improvement. Lung exam normal, consider CXR if no improvement but I believe more likely  allergic cough or GERD   Right ear pain  Eustachian tube dysfunction, right - Plan: ipratropium (ATROVENT) 0.03 % nasal spray   All questions were answered. Visit summary with medication list and pertinent instructions was printed for patient to review. ER/RTC precautions were reviewed with the patient. Return in about 1 week (around 03/26/2016), or if symptoms worsen or fail to improve, for recheck symptoms and recheck BP with Dr Madilyn Fireman.

## 2016-03-25 ENCOUNTER — Ambulatory Visit (INDEPENDENT_AMBULATORY_CARE_PROVIDER_SITE_OTHER): Payer: Federal, State, Local not specified - PPO | Admitting: Osteopathic Medicine

## 2016-03-25 ENCOUNTER — Ambulatory Visit (INDEPENDENT_AMBULATORY_CARE_PROVIDER_SITE_OTHER): Payer: Federal, State, Local not specified - PPO

## 2016-03-25 ENCOUNTER — Encounter: Payer: Self-pay | Admitting: Osteopathic Medicine

## 2016-03-25 VITALS — BP 113/69 | HR 80 | Temp 97.8°F | Resp 18 | Wt 188.0 lb

## 2016-03-25 VITALS — BP 113/69 | HR 80 | Temp 98.5°F | Wt 188.0 lb

## 2016-03-25 DIAGNOSIS — R05 Cough: Secondary | ICD-10-CM | POA: Diagnosis not present

## 2016-03-25 DIAGNOSIS — R059 Cough, unspecified: Secondary | ICD-10-CM

## 2016-03-25 DIAGNOSIS — I517 Cardiomegaly: Secondary | ICD-10-CM | POA: Diagnosis not present

## 2016-03-25 DIAGNOSIS — I10 Essential (primary) hypertension: Secondary | ICD-10-CM

## 2016-03-25 MED ORDER — AZITHROMYCIN 250 MG PO TABS: ORAL_TABLET | ORAL | Status: DC

## 2016-03-25 MED ORDER — OMEPRAZOLE 20 MG PO CPDR: 40.0000 mg | DELAYED_RELEASE_CAPSULE | Freq: Every day | ORAL | Status: DC

## 2016-03-25 MED ORDER — BENZONATATE 200 MG PO CAPS: 200.0000 mg | ORAL_CAPSULE | Freq: Three times a day (TID) | ORAL | Status: DC | PRN

## 2016-03-25 NOTE — Progress Notes (Signed)
   Subjective:    Patient ID: [name], female    DOB: Feb 08, 1956, 59 y.o.   MRN: [id number]  HPIPatient is here for blood pressure check. Denies trouble sleeping, palpitations or medication problems. She is miserable today with congestion, cough, sore throat and mild wheezing. She will be seen by PCP at 1400 today.    Review of Systems     Objective:   Physical Exam        Assessment & Plan:  Patient has stable blood pressure but having URI problems and will be seen later today by PCP.

## 2016-03-25 NOTE — Progress Notes (Signed)
HPI: [name] is a 60 y.o. female who presents to [facility] today for chief complaint of:  Chief Complaint  Patient presents with  . Cough  . Sore Throat     . Context: been going on some time, feeling sick, I saw her last week for ear pain and advised ENT followup for ear, complaint today is persistent cough. Thkns may have been an issue of "air contamination" at sister's house, apparently air filters not changed in years.  . Duration: sick altogether for about 3 weeks  . Modifying factors: Doxycycline and prednisone Rx by UC 03/09/16, Keflex prior.  . Assoc signs/symptoms: sore throat  MRI/MRA 03/14/16  negative for acute process (+) old infarct  CXR 03/09/16 negative   Past medical, social and family history reviewed: Past Medical History  Diagnosis Date  . HLD (hyperlipidemia)     nec/nos  . HTN (hypertension)     benign essential  . Narcolepsy   . Glucose intolerance (impaired glucose tolerance)   . Chronic diastolic heart failure (HCC)     hospitalized at [facility]  . Bipolar disorder ([place])   . Suicide attempt ([place])     hx  . Syncope     admx to [facility] in 2/12: Echo with normal LVF; head CT unremarkable, ECG stable, no further w/u  . Complication of anesthesia     had "breathing problems and ended up in ICU" 03/2010 report on chart  . CHF (congestive heart failure) ([place])   . MVP (mitral valve prolapse)   . DDD (degenerative disc disease), lumbar   . GERD (gastroesophageal reflux disease)   . Ulcer   . Diabetes in pregnancy ([place])     diet cnontrolled   Past Surgical History  Procedure Laterality Date  . Partial hysterectomy    . Bladder suspension    . Gastric bypass    . Repair peroneal tendons ankle    . Lumbar laminectomy/decompression microdiscectomy  09/11/2011    Procedure: LUMBAR LAMINECTOMY/DECOMPRESSION MICRODISCECTOMY;  Surgeon: Tobi Bastos;  Location: [facility];  Service: Orthopedics;  Laterality: Right;   Hemi Laminectomy/Microdiscectomy L5 - S1 on the Right (X-Ray)   Social History  Substance Use Topics  . Smoking status: Former Smoker    Quit date: 10/08/2003  . Smokeless tobacco: Not on file  . Alcohol Use: Yes     Comment: rare   Family History  Problem Relation Age of Onset  . Diabetes Mother   . Hypertension Mother   . Heart attack Father 53  . Hypertension Father   . Stroke Other     grandmother    Current Outpatient Prescriptions  Medication Sig Dispense Refill  . amLODipine-olmesartan (AZOR) 10-40 MG tablet Take 1 tablet by mouth daily. 90 tablet 0  . aspirin (ADULT ASPIRIN EC LOW STRENGTH) 81 MG EC tablet Take 81 mg by mouth daily.      . carvedilol (COREG) 25 MG tablet TAKE ONE TABLET BY MOUTH TWICE DAILY 60 tablet 2  . clonazePAM (KLONOPIN) 1 MG tablet Take 1 mg by mouth 3 (three) times daily.     . cloNIDine (CATAPRES) 0.2 MG tablet TAKE TWO TABLETS BY MOUTH THREE TIMES DAILY -  NEEDS  OFFICE  VISIT 180 tablet 0  . DULoxetine (CYMBALTA) 60 MG capsule Take 60 mg by mouth every evening.     . enalapril (VASOTEC) 20 MG tablet TAKE ONE TABLET BY MOUTH TWICE DAILY 60 tablet 2  .  esomeprazole (NEXIUM) 40 MG capsule Take 40 mg by mouth 2 (two) times daily as needed. 1 capsule daily scheduled, will take an additional capsule if needed    . furosemide (LASIX) 20 MG tablet Take 1 tablet (20 mg total) by mouth daily. 30 tablet 1  . hydrALAZINE (APRESOLINE) 100 MG tablet Take 1 tablet (100 mg total) by mouth 3 (three) times daily. 90 tablet 1  . HYDROcodone-acetaminophen (NORCO/VICODIN) 5-325 MG tablet Take 1 tablet by mouth every 6 (six) hours as needed. 6 tablet 0  . ipratropium (ATROVENT) 0.03 % nasal spray Place 2 sprays into both nostrils 2 (two) times daily as needed (nasal congestion, ear discomfort). 30 mL 0  . Multiple Vitamins-Minerals (MULTIVITAMINS THER. W/MINERALS) TABS Take 1 tablet by mouth daily.      [name] omeprazole (PRILOSEC) 20 MG capsule Take 20 mg by mouth daily.     [name] spironolactone (ALDACTONE) 50 MG tablet Take 1 tablet (50 mg total) by mouth daily. 30 tablet 0  . VENTOLIN HFA 108 (90 BASE) MCG/ACT inhaler prn     No current facility-administered medications for this visit.   Allergies  Allergen Reactions  . Codeine Nausea And Vomiting      Review of Systems: CONSTITUTIONAL:  No  fever, no chills, No recent illness, No unintentional weight changes HEAD/EYES/EARS/NOSE/THROAT: No  headache, no vision change, no hearing change, (+) sore throat, (+) sinus pressure CARDIAC: No  chest pain, No  pressure RESPIRATORY: (+) cough, No  shortness of breath/wheeze GASTROINTESTINAL: No  nausea, No  vomiting, No  abdominal pain   Exam:  BP 113/69 mmHg  Pulse 80  Temp(Src) 98.5 F (36.9 C) (Oral)  Wt 188 lb (85.276 kg)  SpO2 98% Constitutional: VS see above. General Appearance: alert, well-developed, well-nourished, NAD Eyes: Normal lids and conjunctive, non-icteric sclera Ears, Nose, Mouth, Throat: MMM, Normal external inspection ears/nares/mouth/lips/gums, TM normal bilaterally. Pharynx/tonsils no erythema, no exudate. Nasal mucosa normal.  Neck: No masses, trachea midline. No thyroid enlargement. No tenderness/mass appreciated. No lymphadenopathy Respiratory: Normal respiratory effort. no wheeze, no rhonchi, no rales Cardiovascular: S1/S2 normal, no murmur, no rub/gallop auscultated. RRR. No lower extremity edema.    CXR on personal review Cardiomediastinal silhouette/heart size: abnormal - (+)cardiomegaly Obvious bony abnormality: none Infiltrate: none Mass or other opacity: none Atelectasis: none Diaphragms: normal Lateral view: normal Images were reviewed with the patient. Pt counseled that radiologist will review the images as well, our office will call if the formal read reveals any significant findings other than what has been noted above.   Dg Chest 2 View  03/25/2016  CLINICAL DATA:  Cough and sore throat for 3 weeks; history of CHF  and gastroesophageal reflux, former smoker. EXAM: CHEST  2 VIEW COMPARISON:  Chest x-ray of March 09, 2016 FINDINGS: The lungs are adequately inflated. There is no alveolar infiltrate or pleural effusion. The cardiac silhouette is mildly enlarged and slightly more conspicuous than in the past. The central pulmonary vascularity is prominent. The mediastinum is normal in width. There is no pleural effusion. There is tortuosity of the descending thoracic aorta. There is mild multilevel degenerative disc disease of the thoracic spine. IMPRESSION: Mild hyperinflation consistent with COPD and the patient's previous smoking history. New mild cardiomegaly with central pulmonary vascular congestion. Electronically Signed   By: David  Martinique M.D.   On: 03/25/2016 15:09     ASSESSMENT/PLAN: Consideration for possible atypical pneumonia, cough variant asthma or COPD, possible acid reflux. Mold exposure is a possible concern as well  but no other immunocompromise and no sick contacts with similar exposure. CXR concern for cardiomegaly and vascular congestion per official read, consideration for echo to evaluate cardiac function if no improvement, could also consider BNP. Called patient 03/26/2016 3:30 PM to review CXR results and above plan - all questions answered   Cough - Plan: DG Chest 2 View, azithromycin (ZITHROMAX) 250 MG tablet, omeprazole (PRILOSEC) 20 MG capsule   All questions were answered. Visit summary with medication list and pertinent instructions was printed for patient to review. ER/RTC precautions were reviewed with the patient. Return if symptoms worsen or fail to improve in one week.

## 2016-03-26 NOTE — Progress Notes (Signed)
See other progress note.

## 2016-03-29 ENCOUNTER — Telehealth: Payer: Self-pay

## 2016-03-29 NOTE — Telephone Encounter (Signed)
[name] has not heard from Neurology. [name] could you check on this?

## 2016-04-02 ENCOUNTER — Telehealth: Payer: Self-pay | Admitting: Osteopathic Medicine

## 2016-04-02 ENCOUNTER — Telehealth: Payer: Self-pay | Admitting: *Deleted

## 2016-04-02 DIAGNOSIS — I517 Cardiomegaly: Secondary | ICD-10-CM

## 2016-04-02 DIAGNOSIS — R0602 Shortness of breath: Secondary | ICD-10-CM

## 2016-04-02 NOTE — Telephone Encounter (Signed)
Pt left a voice mail that she called the neurology office she was referred to and they state they didn't have the referral. [name] can you please look into this?

## 2016-04-02 NOTE — Telephone Encounter (Signed)
I do remember discussing this with the patient over the phone once I had the official radiology report for the x-ray back. I can't seem to find the result note for that x-ray with specific documentation of our conversation. I reviewed results with her, noting that the heart appeared a bit enlarged and lungs appeared a bit congested compared to previous, though on my personal review this didn't appear to be any severe change, and I believe we discussed possibly repeating echocardiogram based on her breathing, but I did want her to follow-up with her PCP to recheck her symptoms (I had concern for bronchitis/viral process at the time) and I would defer that decision on whether or not to to get an echocardiogram based on patient's symptoms. Will route to Dr. Suzi Roots as Juluis Rainier

## 2016-04-02 NOTE — Telephone Encounter (Signed)
Pt called stating she was advised she should repeat an echo based on chest xray results. Pt is set to see cardiologist on 06/05/16. Should echo be ordered now or wait until cardiology eval? Will route.

## 2016-04-03 NOTE — Telephone Encounter (Signed)
Echo ordered and printed.

## 2016-04-03 NOTE — Telephone Encounter (Signed)
Let go ahead and get echo. Do you mind ordering [name]

## 2016-04-17 ENCOUNTER — Ambulatory Visit (HOSPITAL_BASED_OUTPATIENT_CLINIC_OR_DEPARTMENT_OTHER)
Admission: RE | Admit: 2016-04-17 | Discharge: 2016-04-17 | Disposition: A | Discharge status: Home / Self Care | Payer: Federal, State, Local not specified - PPO | Source: Ambulatory Visit | Attending: Family Medicine | Admitting: Family Medicine

## 2016-04-17 DIAGNOSIS — Z87891 Personal history of nicotine dependence: Secondary | ICD-10-CM | POA: Insufficient documentation

## 2016-04-17 DIAGNOSIS — Z6830 Body mass index (BMI) 30.0-30.9, adult: Secondary | ICD-10-CM | POA: Insufficient documentation

## 2016-04-17 DIAGNOSIS — R0602 Shortness of breath: Secondary | ICD-10-CM | POA: Insufficient documentation

## 2016-04-17 DIAGNOSIS — I517 Cardiomegaly: Secondary | ICD-10-CM

## 2016-04-17 DIAGNOSIS — I509 Heart failure, unspecified: Secondary | ICD-10-CM | POA: Diagnosis not present

## 2016-04-17 DIAGNOSIS — I351 Nonrheumatic aortic (valve) insufficiency: Secondary | ICD-10-CM | POA: Insufficient documentation

## 2016-04-17 DIAGNOSIS — E669 Obesity, unspecified: Secondary | ICD-10-CM | POA: Insufficient documentation

## 2016-04-17 DIAGNOSIS — I110 Hypertensive heart disease with heart failure: Secondary | ICD-10-CM | POA: Insufficient documentation

## 2016-04-17 NOTE — Progress Notes (Signed)
Echocardiogram 2D Echocardiogram has been performed.  Tresa Res 04/17/2016, 10:08 AM

## 2016-04-30 DIAGNOSIS — G43919 Migraine, unspecified, intractable, without status migrainosus: Secondary | ICD-10-CM | POA: Diagnosis not present

## 2016-04-30 DIAGNOSIS — R51 Headache: Secondary | ICD-10-CM | POA: Diagnosis not present

## 2016-04-30 DIAGNOSIS — Z8249 Family history of ischemic heart disease and other diseases of the circulatory system: Secondary | ICD-10-CM | POA: Diagnosis not present

## 2016-05-10 DIAGNOSIS — I6782 Cerebral ischemia: Secondary | ICD-10-CM | POA: Diagnosis not present

## 2016-05-13 DIAGNOSIS — R413 Other amnesia: Secondary | ICD-10-CM | POA: Diagnosis not present

## 2016-05-23 DIAGNOSIS — I7090 Unspecified atherosclerosis: Secondary | ICD-10-CM | POA: Diagnosis not present

## 2016-05-30 DIAGNOSIS — I7090 Unspecified atherosclerosis: Secondary | ICD-10-CM | POA: Diagnosis not present

## 2016-06-03 NOTE — Progress Notes (Signed)
HPI: FU hypertension. Previous renal Dopplers were technically difficult. There was no stenosis on the left and the right was not well visualized. Follow up CTA to rule out renal artery stenosis was done and was negative. Last Myoview in November 2012 showed an ejection fraction of 71%, breast attenuation but no ischemia. Echocardiogram July 2017 showed normal LV systolic function and grade 1 diastolic dysfunction. Since last seen, She denies chest pain or syncope. She has mild dyspnea on exertion but no orthopnea, PND or pedal edema.  Current Outpatient Prescriptions  Medication Sig Dispense Refill  . amLODipine-olmesartan (AZOR) 10-40 MG tablet Take 1 tablet by mouth daily. 90 tablet 0  . aspirin (ADULT ASPIRIN EC LOW STRENGTH) 81 MG EC tablet Take 81 mg by mouth daily.      . carvedilol (COREG) 25 MG tablet TAKE ONE TABLET BY MOUTH TWICE DAILY 60 tablet 2  . clonazePAM (KLONOPIN) 1 MG tablet Take 1 mg by mouth 3 (three) times daily.     . cloNIDine (CATAPRES) 0.2 MG tablet TAKE TWO TABLETS BY MOUTH THREE TIMES DAILY -  NEEDS  OFFICE  VISIT 180 tablet 0  . DULoxetine (CYMBALTA) 60 MG capsule Take 60 mg by mouth every evening.     . enalapril (VASOTEC) 20 MG tablet TAKE ONE TABLET BY MOUTH TWICE DAILY 60 tablet 2  . esomeprazole (NEXIUM) 40 MG capsule Take 40 mg by mouth 2 (two) times daily as needed. 1 capsule daily scheduled, will take an additional capsule if needed    . furosemide (LASIX) 20 MG tablet Take 1 tablet (20 mg total) by mouth daily. 30 tablet 1  . hydrALAZINE (APRESOLINE) 100 MG tablet Take 1 tablet (100 mg total) by mouth 3 (three) times daily. 90 tablet 1  . HYDROcodone-acetaminophen (NORCO/VICODIN) 5-325 MG tablet Take 1 tablet by mouth every 6 (six) hours as needed. 6 tablet 0  . ipratropium (ATROVENT) 0.03 % nasal spray Place 2 sprays into both nostrils 2 (two) times daily as needed (nasal congestion, ear discomfort). 30 mL 0  . Multiple Vitamins-Minerals  (MULTIVITAMINS THER. W/MINERALS) TABS Take 1 tablet by mouth daily.      [name] omeprazole (PRILOSEC) 20 MG capsule Take 2 capsules (40 mg total) by mouth daily. 30 capsule 0  . spironolactone (ALDACTONE) 50 MG tablet Take 1 tablet (50 mg total) by mouth daily. 30 tablet 0  . VENTOLIN HFA 108 (90 BASE) MCG/ACT inhaler prn     No current facility-administered medications for this visit.      Past Medical History:  Diagnosis Date  . Bipolar disorder ([place])   . CHF (congestive heart failure) ([place])   . Chronic diastolic heart failure (HCC)    hospitalized at [facility]  . Complication of anesthesia    had "breathing problems and ended up in ICU" 03/2010 report on chart  . DDD (degenerative disc disease), lumbar   . Diabetes in pregnancy ([place])    diet cnontrolled  . GERD (gastroesophageal reflux disease)   . Glucose intolerance (impaired glucose tolerance)   . HLD (hyperlipidemia)    nec/nos  . HTN (hypertension)    benign essential  . MVP (mitral valve prolapse)   . Narcolepsy   . Suicide attempt ([place])    hx  . Syncope    admx to [facility] in 2/12: Echo with normal LVF; head CT unremarkable, ECG stable, no further w/u  . Ulcer     Past Surgical History:  Procedure Laterality  Date  . BLADDER SUSPENSION    . GASTRIC BYPASS    . LUMBAR LAMINECTOMY/DECOMPRESSION MICRODISCECTOMY  09/11/2011   Procedure: LUMBAR LAMINECTOMY/DECOMPRESSION MICRODISCECTOMY;  Surgeon: Tobi Bastos;  Location: [facility];  Service: Orthopedics;  Laterality: Right;  Hemi Laminectomy/Microdiscectomy L5 - S1 on the Right (X-Ray)  . PARTIAL HYSTERECTOMY    . REPAIR PERONEAL TENDONS ANKLE      Social History   Social History  . Marital status: Married    Spouse name: N/A  . Number of children: N/A  . Years of education: N/A   Occupational History  . Not on file.   Social History Main Topics  . Smoking status: Former Smoker    Quit date: 10/08/2003  . Smokeless tobacco: Never Used  . Alcohol use Yes      Comment: rare  . Drug use: No  . Sexual activity: Not on file   Other Topics Concern  . Not on file   Social History Narrative   Regularly exercises.     Family History  Problem Relation Age of Onset  . Heart attack Father 35  . Hypertension Father   . Diabetes Mother   . Hypertension Mother   . Stroke Other     grandmother    ROS: Occasional headaches but no fevers or chills, productive cough, hemoptysis, dysphasia, odynophagia, melena, hematochezia, dysuria, hematuria, rash, seizure activity, orthopnea, PND, pedal edema, claudication. Remaining systems are negative.  Physical Exam: Well-developed well-nourished in no acute distress.  Skin is warm and dry.  HEENT is normal.  Neck is supple.  Chest is clear to auscultation with normal expansion.  Cardiovascular exam is regular rate and rhythm.  Abdominal exam nontender or distended. No masses palpated. Extremities show no edema. neuro grossly intact  ECG Sinus rhythm at a rate of 63. Normal axis. Anterior lateral T-wave inversion. Cannot rule out septal infarct. No change compared to previous.  A/P  1 Hyperlipidemia-Management per primary care.  2 dyspnea-etiology unclear. Previous cardiac evaluation unremarkable. Not volume overloaded on examination. Follow-up primary care.   3 hypertension-Blood pressure is elevated. She has had difficult to control blood pressure for quite some time. She is on an ACE inhibitor and an ARB. Discontinue enalapril. Discontinue carvedilol. Begin labetalol 200 mg twice a day. Follow blood pressure and advance medications as needed. Check potassium and renal function.  Kirk Ruths, MD

## 2016-06-05 ENCOUNTER — Other Ambulatory Visit: Payer: Self-pay | Admitting: Cardiology

## 2016-06-05 ENCOUNTER — Encounter: Payer: Self-pay | Admitting: Cardiology

## 2016-06-05 ENCOUNTER — Ambulatory Visit (INDEPENDENT_AMBULATORY_CARE_PROVIDER_SITE_OTHER): Payer: Federal, State, Local not specified - PPO | Admitting: Cardiology

## 2016-06-05 VITALS — BP 188/116 | HR 63 | Ht 66.0 in | Wt 184.1 lb

## 2016-06-05 DIAGNOSIS — R0600 Dyspnea, unspecified: Secondary | ICD-10-CM

## 2016-06-05 DIAGNOSIS — I10 Essential (primary) hypertension: Secondary | ICD-10-CM | POA: Diagnosis not present

## 2016-06-05 DIAGNOSIS — E785 Hyperlipidemia, unspecified: Secondary | ICD-10-CM | POA: Diagnosis not present

## 2016-06-05 MED ORDER — LABETALOL HCL 200 MG PO TABS: 200.0000 mg | ORAL_TABLET | Freq: Two times a day (BID) | ORAL | 11 refills | Status: DC

## 2016-06-05 NOTE — Patient Instructions (Signed)
Your physician has recommended you make the following change in your medication: STOP CARVEDILOL AND  ENALAPRIL START  LABETALOL 200 MG  TWICE  DAILY  STARTING AM DOSE  TOMORROW  Your physician recommends that you return for lab work in:  [place] physician recommends that you schedule a follow-up appointment in:   [place] NP AND  3 MONTHS  WITH DR Stanford Breed

## 2016-06-06 LAB — BASIC METABOLIC PANEL
BUN: 10 mg/dL (ref 7–25)
CO2: 27 mmol/L (ref 20–31)
CREATININE: 0.84 mg/dL (ref 0.50–1.05)
Calcium: 9 mg/dL (ref 8.6–10.4)
Chloride: 109 mmol/L (ref 98–110)
Glucose, Bld: 85 mg/dL (ref 65–99)
Potassium: 4.3 mmol/L (ref 3.5–5.3)
Sodium: 142 mmol/L (ref 135–146)

## 2016-06-07 ENCOUNTER — Telehealth: Payer: Self-pay | Admitting: Cardiology

## 2016-06-07 NOTE — Telephone Encounter (Signed)
Closed encounter °

## 2016-06-12 ENCOUNTER — Ambulatory Visit (INDEPENDENT_AMBULATORY_CARE_PROVIDER_SITE_OTHER): Payer: Federal, State, Local not specified - PPO | Admitting: Cardiology

## 2016-06-12 ENCOUNTER — Encounter: Payer: Self-pay | Admitting: Cardiology

## 2016-06-12 VITALS — BP 136/92 | HR 64 | Ht 66.0 in | Wt 183.6 lb

## 2016-06-12 DIAGNOSIS — I10 Essential (primary) hypertension: Secondary | ICD-10-CM

## 2016-06-12 DIAGNOSIS — R55 Syncope and collapse: Secondary | ICD-10-CM

## 2016-06-12 DIAGNOSIS — Z79899 Other long term (current) drug therapy: Secondary | ICD-10-CM

## 2016-06-12 DIAGNOSIS — Z8673 Personal history of transient ischemic attack (TIA), and cerebral infarction without residual deficits: Secondary | ICD-10-CM | POA: Diagnosis not present

## 2016-06-12 DIAGNOSIS — E1159 Type 2 diabetes mellitus with other circulatory complications: Secondary | ICD-10-CM | POA: Insufficient documentation

## 2016-06-12 MED ORDER — ALISKIREN FUMARATE 150 MG PO TABS: 150.0000 mg | ORAL_TABLET | Freq: Every day | ORAL | 6 refills | Status: DC

## 2016-06-12 NOTE — Assessment & Plan Note (Signed)
Chronic Right cerebral. Seen on MRI done in June 2017.

## 2016-06-12 NOTE — Assessment & Plan Note (Signed)
B/P remains uncontrolled despite max doses of several medications

## 2016-06-12 NOTE — Addendum Note (Signed)
Addended by: Ulice Brilliant T on: 06/12/2016 03:07 PM   Modules accepted: Orders

## 2016-06-12 NOTE — Progress Notes (Signed)
06/12/2016 [name]   07/22/1956  [id number]  Primary Physician [name],CATHERINE, MD Primary Cardiologist: Dr Stanford Breed  HPI:  60 y/o AA female with a history of HTN and prio CVA by MRI. Her B/P has been difficult to control and she is on maximum doses of several medications. 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 Dr Stanford Breed saw her 06/05/16 and stooped her Coreg and Enalapril and started her on Labetalol 200 mg BID. Labs done showed normal BMP. She is in the office today for follow up. Her B/P by me was 172/70 on Lt, 172/72 on the Rt. She denies any anginal chest pain, orthopnea, or unusual dyspnea. She has occasional "pulling" Lt sided chest pain that last "seconds" and is not exertional.    Current Outpatient Prescriptions  Medication Sig Dispense Refill  . amLODipine-olmesartan (AZOR) 10-40 MG tablet Take 1 tablet by mouth daily. 90 tablet 0  . aspirin (ADULT ASPIRIN EC LOW STRENGTH) 81 MG EC tablet Take 81 mg by mouth daily.      . clonazePAM (KLONOPIN) 1 MG tablet Take 1 mg by mouth 3 (three) times daily.     . cloNIDine (CATAPRES) 0.2 MG tablet TAKE TWO TABLETS BY MOUTH THREE TIMES DAILY -  NEEDS  OFFICE  VISIT 180 tablet 0  . DULoxetine (CYMBALTA) 60 MG capsule Take 60 mg by mouth every evening.     [name] esomeprazole (NEXIUM) 40 MG capsule Take 40 mg by mouth 2 (two) times daily as needed. 1 capsule daily scheduled, will take an additional capsule if needed    . furosemide (LASIX) 20 MG tablet Take 1 tablet (20 mg total) by mouth daily. 30 tablet 1  . hydrALAZINE (APRESOLINE) 100 MG tablet Take 1 tablet (100 mg total) by mouth 3 (three) times daily. 90 tablet 1  . HYDROcodone-acetaminophen (NORCO/VICODIN) 5-325 MG tablet Take 1 tablet by mouth every 6 (six) hours as needed. 6 tablet 0  . ipratropium (ATROVENT) 0.03 % nasal spray Place 2 sprays into both nostrils 2 (two) times daily as needed (nasal congestion, ear discomfort). 30 mL 0  . labetalol (NORMODYNE) 200 MG tablet Take 1 tablet  (200 mg total) by mouth 2 (two) times daily. 60 tablet 11  . Multiple Vitamins-Minerals (MULTIVITAMINS THER. W/MINERALS) TABS Take 1 tablet by mouth daily.      [name] omeprazole (PRILOSEC) 20 MG capsule Take 2 capsules (40 mg total) by mouth daily. 30 capsule 0  . spironolactone (ALDACTONE) 50 MG tablet Take 1 tablet (50 mg total) by mouth daily. 30 tablet 0  . VENTOLIN HFA 108 (90 BASE) MCG/ACT inhaler prn     No current facility-administered medications for this visit.     Allergies  Allergen Reactions  . Codeine Nausea And Vomiting    Social History   Social History  . Marital status: Married    Spouse name: N/A  . Number of children: N/A  . Years of education: N/A   Occupational History  . Not on file.   Social History Main Topics  . Smoking status: Former Smoker    Quit date: 10/08/2003  . Smokeless tobacco: Never Used  . Alcohol use Yes     Comment: rare  . Drug use: No  . Sexual activity: Not on file   Other Topics Concern  . Not on file   Social History Narrative   Regularly exercises.      Review of Systems: General: negative for chills, fever, night sweats or weight changes.  Cardiovascular:  negative for chest pain, dyspnea on exertion, edema, orthopnea, palpitations, paroxysmal nocturnal dyspnea or shortness of breath Dermatological: negative for rash Respiratory: negative for cough or wheezing Urologic: negative for hematuria Abdominal: negative for nausea, vomiting, diarrhea, bright red blood per rectum, melena, or hematemesis Neurologic: negative for visual changes, syncope, or dizziness All other systems reviewed and are otherwise negative except as noted above.    Blood pressure (!) 136/92, pulse 64, height 5\' 6"  (1.676 m), weight 183 lb 9.6 oz (83.3 kg).  General appearance: alert, cooperative and no distress Neck: no carotid bruit and no JVD Lungs: clear to auscultation bilaterally Heart: regular rate and rhythm Extremities: extremities normal,  atraumatic, no cyanosis or edema Neurologic: Grossly normal  Echo 04/17/16 Study Conclusions  - Left ventricle: The cavity size was normal. Wall thickness was   increased in a pattern of mild LVH. Systolic function was normal.   The estimated ejection fraction was in the range of 60% to 65%.   Wall motion was normal; there were no regional wall motion   abnormalities. Doppler parameters are consistent with abnormal   left ventricular relaxation (grade 1 diastolic dysfunction).   GLS:-15.7% - Aortic valve: There was trivial regurgitation.  Impressions:  - Compared to the prior study, there has been no significant   interval change.    ASSESSMENT AND PLAN:    Essential hypertension, malignant B/P remains uncontrolled despite max doses of several medications  History of CVA (cerebrovascular accident) Chronic Right cerebral. Seen on MRI done in June 2017.   PLAN  I would like to discuss further medication adjustments with our pharmacist. Burnis Medin contact the pt after this with further instructions.   Kerin Ransom PA-C 06/12/2016 1:17 PM

## 2016-06-12 NOTE — Patient Instructions (Addendum)
Will contact patient with follow up instructions and future appointments per [facility].  Medication Instructions:  START Tekturna 150mg  Take 1 tab by mouth daily.  Labwork: Your physician recommends that you return for lab work in: 1 week complete a BMP.   Testing/Procedures: None ordered  Follow-Up: Your physician recommends that you schedule a follow-up appointment in: 2 weeks with Pharmacist.    Any Other Special Instructions Will Be Listed Below (If Applicable).     If you need a refill on your cardiac medications before your next appointment, please call your pharmacy.

## 2016-06-13 ENCOUNTER — Telehealth: Payer: Self-pay

## 2016-06-13 NOTE — Telephone Encounter (Signed)
Patient seen [name] un office yesterday. No discharge instructions were given. [name] advised patient that once he spoke with Cyril Mourning he would give her a call back with instructions. I called patient to give discharge instructions but no answer. Advised patient to call back. Needs appt with Kristen in 2 wks and start Tekturna 150mg  qd.

## 2016-06-13 NOTE — Telephone Encounter (Signed)
Spoke with patient, informed of discharge instructions, voiced understanding appt with pham D scheduled

## 2016-06-18 ENCOUNTER — Other Ambulatory Visit: Payer: Self-pay | Admitting: Cardiology

## 2016-06-18 NOTE — Telephone Encounter (Signed)
There is an interaction between her Tekturna and Olmesartan /Amlodipine. What does he want to do about this?

## 2016-06-18 NOTE — Telephone Encounter (Signed)
Per [place] at [facility] already responded to by [place]. They will fill both medications.

## 2016-07-01 ENCOUNTER — Other Ambulatory Visit: Payer: Self-pay | Admitting: Cardiology

## 2016-07-01 ENCOUNTER — Encounter: Payer: Self-pay | Admitting: Pharmacist

## 2016-07-01 ENCOUNTER — Ambulatory Visit (INDEPENDENT_AMBULATORY_CARE_PROVIDER_SITE_OTHER): Payer: Medicare Other | Admitting: Pharmacist

## 2016-07-01 VITALS — BP 122/80 | HR 67

## 2016-07-01 DIAGNOSIS — I10 Essential (primary) hypertension: Secondary | ICD-10-CM

## 2016-07-01 LAB — BASIC METABOLIC PANEL
BUN: 9 mg/dL (ref 7–25)
CO2: 26 mmol/L (ref 20–31)
Calcium: 9 mg/dL (ref 8.6–10.4)
Chloride: 107 mmol/L (ref 98–110)
Creat: 0.8 mg/dL (ref 0.50–1.05)
Glucose, Bld: 111 mg/dL — ABNORMAL HIGH (ref 65–99)
Potassium: 3.6 mmol/L (ref 3.5–5.3)
Sodium: 139 mmol/L (ref 135–146)

## 2016-07-01 NOTE — Patient Instructions (Addendum)
Return for a follow up appointment in 3-4 weeks  Go to the lab today to get blood work.  Check your blood pressure at home daily (if able) and keep record of the readings.  Take your BP meds as follows: Continue medications as prescribed  - take labetalol about 12 hours apart  Make sure that you are getting all doses in each day as best you can   Bring all of your meds, your BP cuff and your record of home blood pressures to your next appointment.  Exercise as you're able, try to walk approximately 30 minutes per day.  Keep salt intake to a minimum, especially watch canned and prepared boxed foods.  Eat more fresh fruits and vegetables and fewer canned items.  Avoid eating in fast food restaurants.    HOW TO TAKE YOUR BLOOD PRESSURE: . Rest 5 minutes before taking your blood pressure. .  Don't smoke or drink caffeinated beverages for at least 30 minutes before. . Take your blood pressure before (not after) you eat. . Sit comfortably with your back supported and both feet on the floor (don't cross your legs). . Elevate your arm to heart level on a table or a desk. . Use the proper sized cuff. It should fit smoothly and snugly around your bare upper arm. There should be enough room to slip a fingertip under the cuff. The bottom edge of the cuff should be 1 inch above the crease of the elbow. . Ideally, take 3 measurements at one sitting and record the average.

## 2016-07-01 NOTE — Progress Notes (Signed)
Patient ID: [name]                 DOB: Aug 15, 1956                      MRN: [id number]     HPI: [name] is a 60 y.o. female patient of Dr. Stanford Breed with PMH below who presents today for hypertension evaluation.  She reports a long history with hypertension. She states the hypertension predates her gastric bypass surgery and that ultimately the uncontrollable hypertension was the reason she got the bypass surgery. She admits though that her hypertension has not really improved despite the weight loss. She recently had an ECHO which revealed EF 60-65% and grade 1 diastolic dysfunction. She has previously had sleep study which was revealing for narcolepsy, but not OSA. She also has had CTA of renal arteries that was negative.   She reports that she is borderline diabetic per primary care.   She believes that a significant amount of her pressure issues are caused by her stressful life. She is taking care of her husband after he was in an accident and she states that his habits around the house tend to "grate on her nerves."  She was recently started on Tekturna for her difficult to manage blood pressure. She reports not noticing much of a change with in her symptoms since starting the medication.   She states she occasionally has periods when her heart races since changing from carvedilol to labetalol.   She reports that hydralazine causes her to feel as if she is hyperventilating.   OF NOTE - she states she misses her medications about twice per week due to complexity of her dosing schedule.   Cardiac Hx: HTN, HLD, hx if CVA  Current HTN meds:  aliskiren 150mg  daily Amlodipine-olmesartan 10-40mg  daily Clonidine 0.2mg  Furosemide 20mg  daily Hydralazine 100mg  TID Labetalol 200mg  BID Spironolactone 50mg  daily  Dosing schedule as follows -   7am - furosemide, Tekturna, labetalol  10am - amlodipine/olmesartan, spironolactone, clonidine   11am - hydralazine  2-3pm - clonidine,  hydralazine  7-8pm - clonidine  10pm - hydralazine, labetalol   Previously tried:  Enalapril - d/ced because used in combo with olmesartan Carvedilol - changed to labetalol  BP goal: <140/90  Family History: She states her father's side of the family has a strong history of chronic hypertension, cardiac issues, and aneurysms. Her mother's side of the family also had history of hypertension and diabetes.   Social History: She quit smoking in 2006. She has never used smokeless tobacco. She drinks wine only occasionally.   Diet: She eats most of her meals from home, but states she really does not eat that much since her gastric bypass surgery. She does cook with salt and seasonings and states that she does not limit the amount at all. She states she is usually more thirsty than hungry and she drinks quite a bit of fluids during the day, despite being told that she should limit fluid consumption. She drinks a combination of water, coke, and crystal light throughout the day. She also chews ice chips almost constantly.   Exercise: She is active doing housework, but states recently she has just been too out of breath to even walk the dogs.   Home BP readings:  She reports that she does monitor at home and her pressures of late have been in [id number] systolic which is an improvement for her.   Wt Readings  from Last 3 Encounters:  06/12/16 183 lb 9.6 oz (83.3 kg)  06/05/16 184 lb 1.9 oz (83.5 kg)  03/25/16 188 lb (85.3 kg)   BP Readings from Last 3 Encounters:  07/01/16 122/80  06/12/16 (!) 136/92  06/05/16 (!) 188/116   Pulse Readings from Last 3 Encounters:  07/01/16 67  06/12/16 64  06/05/16 63    Renal function: CrCl cannot be calculated (Unknown ideal weight.).  Past Medical History:  Diagnosis Date  . Bipolar disorder ([place])   . CHF (congestive heart failure) ([place])   . Chronic diastolic heart failure (HCC)    hospitalized at [facility]  . Complication of anesthesia    had  "breathing problems and ended up in ICU" 03/2010 report on chart  . DDD (degenerative disc disease), lumbar   . Diabetes in pregnancy ([place])    diet cnontrolled  . GERD (gastroesophageal reflux disease)   . Glucose intolerance (impaired glucose tolerance)   . HLD (hyperlipidemia)    nec/nos  . HTN (hypertension)    benign essential  . MVP (mitral valve prolapse)   . Narcolepsy   . Suicide attempt ([place])    hx  . Syncope    admx to [facility] in 2/12: Echo with normal LVF; head CT unremarkable, ECG stable, no further w/u  . Ulcer     Current Outpatient Prescriptions on File Prior to Visit  Medication Sig Dispense Refill  . aliskiren (TEKTURNA) 150 MG tablet Take 1 tablet (150 mg total) by mouth daily. 30 tablet 6  . amLODipine-olmesartan (AZOR) 10-40 MG tablet Take 1 tablet by mouth daily. 90 tablet 0  . aspirin (ADULT ASPIRIN EC LOW STRENGTH) 81 MG EC tablet Take 81 mg by mouth daily.      . clonazePAM (KLONOPIN) 1 MG tablet Take 1 mg by mouth 3 (three) times daily.     . cloNIDine (CATAPRES) 0.2 MG tablet TAKE TWO TABLETS BY MOUTH THREE TIMES DAILY -  NEEDS  OFFICE  VISIT 180 tablet 0  . DULoxetine (CYMBALTA) 60 MG capsule Take 60 mg by mouth every evening.     [name] esomeprazole (NEXIUM) 40 MG capsule Take 40 mg by mouth 2 (two) times daily as needed. 1 capsule daily scheduled, will take an additional capsule if needed    . furosemide (LASIX) 20 MG tablet Take 1 tablet (20 mg total) by mouth daily. 30 tablet 1  . hydrALAZINE (APRESOLINE) 100 MG tablet Take 1 tablet (100 mg total) by mouth 3 (three) times daily. 90 tablet 1  . ipratropium (ATROVENT) 0.03 % nasal spray Place 2 sprays into both nostrils 2 (two) times daily as needed (nasal congestion, ear discomfort). 30 mL 0  . labetalol (NORMODYNE) 200 MG tablet Take 1 tablet (200 mg total) by mouth 2 (two) times daily. 60 tablet 11  . Multiple Vitamins-Minerals (MULTIVITAMINS THER. W/MINERALS) TABS Take 1 tablet by mouth daily.      [name]  omeprazole (PRILOSEC) 20 MG capsule Take 2 capsules (40 mg total) by mouth daily. 30 capsule 0  . spironolactone (ALDACTONE) 50 MG tablet Take 1 tablet (50 mg total) by mouth daily. 30 tablet 0  . VENTOLIN HFA 108 (90 BASE) MCG/ACT inhaler prn     No current facility-administered medications on file prior to visit.     Allergies  Allergen Reactions  . Codeine Nausea And Vomiting    Blood pressure 122/80, pulse 67.   Assessment/Plan: Hypertension: BMET today. BP is at goal today and much  improved from previous measurements. My concern for her on Marisa Severin is the combination with the ARB and her borderline diabetes. We will need to monitor her closely and potentially pursue other avenues to control her pressures as this combination is contraindicated in patients with diabetes. Have also asked that she take her labetalol about 12 hours apart to hopefully decrease the periods of heart racing. The labetalol dose may need to be adjusted to help with both the heart palpitations and blood pressures. Follow up in hypertension clinic in 3 weeks.    Thank you, Lelan Pons. Patterson Hammersmith, [facility]  07/01/2016 3:11 PM

## 2016-07-22 ENCOUNTER — Encounter: Payer: Self-pay | Admitting: Pharmacist

## 2016-07-22 ENCOUNTER — Ambulatory Visit (INDEPENDENT_AMBULATORY_CARE_PROVIDER_SITE_OTHER): Payer: Federal, State, Local not specified - PPO | Admitting: Pharmacist

## 2016-07-22 VITALS — BP 152/88 | HR 71

## 2016-07-22 DIAGNOSIS — I10 Essential (primary) hypertension: Secondary | ICD-10-CM | POA: Diagnosis not present

## 2016-07-22 NOTE — Progress Notes (Signed)
Patient ID: [name]                 DOB: 1956-02-24                      MRN: [id number]     HPI: [name] is a 60 y.o. female patient of Dr. Stanford Breed with PMH below who presents today for hypertension follow up.  She reports a long history with hypertension. She states the hypertension predates her gastric bypass surgery and that ultimately the uncontrollable hypertension was the reason she got the bypass surgery. She admits though that her hypertension has not really improved despite the weight loss. She recently had an ECHO which revealed EF 60-65% and grade 1 diastolic dysfunction. She has previously had sleep study which was revealing for narcolepsy, but not OSA. She also has had CTA of renal arteries that was negative.  At last visit in September, no medication changes were made except patient was counseled on taking her labetalol 12 hours apart.   Today in clinic, pt endorses that she has been taking her labetalol at the appropriate times and is tolerating this change well. However, she continues to report that the hydralazine still causes her to feel like she is hyperventilating. Additionally, she still reports that her heart is still racing with the change of labetalol. Of note, this mainly occurs in the morning upon waking up and she feels light headed and as if she might pass out. This has happened 3x since last visit and she has never checked her BP around the time of these episodes.   Pt still reports having stress at home while taking care of her husband and pets. She also endorses increasing hip pain with the changes in weather.  OF NOTE - At last visit, pt stated that she misses her medications about twice per week due to complexity of her dosing schedule. Today in clinic, pt denies missing medications entirely.   Cardiac Hx: HTN, HLD, hx if CVA  Current HTN meds:  aliskiren 150mg  daily Amlodipine-olmesartan 10-40mg  daily Clonidine 0.4mg  TID Furosemide 20mg   daily Hydralazine 100mg  TID Labetalol 200mg  BID Spironolactone 50mg  daily  Dosing schedule as follows -              7am - furosemide, Tekturna, labetalol,               amlodipine/olmesartan              9am - clonidine              11am - hydralazine             2-3pm - clonidine, hydralazine             7-8pm - clonidine, hydralazine, spironolactone             9pm - labetalol  Previously tried:  Enalapril - d/ced because used in combo with olmesartan Carvedilol - changed to labetalol  BP goal: <140/90  Family History: She states her father's side of the family has a strong history of chronic hypertension, cardiac issues, and aneurysms. Her mother's side of the family also had history of hypertension and diabetes.   Social History: She quit smoking in 2006. She has never used smokeless tobacco. She drinks wine only occasionally.   Diet: She eats most of her meals from home, but states she really does not eat that much since her gastric bypass surgery. She does cook with  salt and seasonings and states that she does not limit the amount at all. She states she is usually more thirsty than hungry and she drinks quite a bit of fluids during the day, despite being told that she should limit fluid consumption. She drinks a combination of water, coke, and crystal light throughout the day. She also chews ice chips almost constantly.   Exercise: She is active doing housework, but states recently she has just been too out of breath to even walk the dogs.   Home BP readings: BP mainly in the 130s/80s at home with occasional spikes to 170s-180s/90s-100s. HR WNL at 60s-70s.   Wt Readings from Last 3 Encounters:  06/12/16 183 lb 9.6 oz (83.3 kg)  06/05/16 184 lb 1.9 oz (83.5 kg)  03/25/16 188 lb (85.3 kg)   BP Readings from Last 3 Encounters:  07/22/16 (!) 152/88  07/01/16 122/80  06/12/16 (!) 136/92   Pulse Readings from Last 3 Encounters:  07/22/16 71  07/01/16 67  06/12/16 64     Renal function: CrCl cannot be calculated (Unknown ideal weight.).  Past Medical History:  Diagnosis Date  . Bipolar disorder ([place])   . CHF (congestive heart failure) ([place])   . Chronic diastolic heart failure (HCC)    hospitalized at [facility]  . Complication of anesthesia    had "breathing problems and ended up in ICU" 03/2010 report on chart  . DDD (degenerative disc disease), lumbar   . 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 GERD (gastroesophageal reflux disease)   . Glucose intolerance (impaired glucose tolerance)   . HLD (hyperlipidemia)    nec/nos  . HTN (hypertension)    benign essential  . MVP (mitral valve prolapse)   . Narcolepsy   . Suicide attempt    hx  . Syncope    admx to [facility] in 2/12: Echo with normal LVF; head CT unremarkable, ECG stable, no further w/u  . Ulcer [facility])     Current Outpatient Prescriptions on File Prior to Visit  Medication Sig Dispense Refill  . aliskiren (TEKTURNA) 150 MG tablet Take 1 tablet (150 mg total) by mouth daily. 30 tablet 6  . amLODipine-olmesartan (AZOR) 10-40 MG tablet Take 1 tablet by mouth daily. 90 tablet 0  . aspirin (ADULT ASPIRIN EC LOW STRENGTH) 81 MG EC tablet Take 81 mg by mouth daily.      . clonazePAM (KLONOPIN) 1 MG tablet Take 1 mg by mouth 3 (three) times daily.     . cloNIDine (CATAPRES) 0.2 MG tablet TAKE TWO TABLETS BY MOUTH THREE TIMES DAILY -  NEEDS  OFFICE  VISIT 180 tablet 0  . DULoxetine (CYMBALTA) 60 MG capsule Take 60 mg by mouth every evening.     [name] esomeprazole (NEXIUM) 40 MG capsule Take 40 mg by mouth 2 (two) times daily as needed. 1 capsule daily scheduled, will take an additional capsule if needed    . furosemide (LASIX) 20 MG tablet Take 1 tablet (20 mg total) by mouth daily. 30 tablet 1  . hydrALAZINE (APRESOLINE) 100 MG tablet Take 1 tablet (100 mg total) by mouth 3 (three) times daily. 90 tablet 1  . labetalol (NORMODYNE) 200 MG tablet Take 1 tablet (200 mg total) by mouth 2  (two) times daily. 60 tablet 11  . Multiple Vitamins-Minerals (MULTIVITAMINS THER. W/MINERALS) TABS Take 1 tablet by mouth daily.      [name] omeprazole (PRILOSEC) 20 MG capsule Take 2 capsules (40 mg  total) by mouth daily. 30 capsule 0  . spironolactone (ALDACTONE) 50 MG tablet Take 1 tablet (50 mg total) by mouth daily. 30 tablet 0  . VENTOLIN HFA 108 (90 BASE) MCG/ACT inhaler prn    . ipratropium (ATROVENT) 0.03 % nasal spray Place 2 sprays into both nostrils 2 (two) times daily as needed (nasal congestion, ear discomfort). (Patient not taking: Reported on 07/22/2016) 30 mL 0   No current facility-administered medications on file prior to visit.     Allergies  Allergen Reactions  . Codeine Nausea And Vomiting    Blood pressure (!) 152/88, pulse 71, SpO2 99 %.   Assessment/Plan: Hypertension: BP is not at goal today. However, pt is reporting symptoms of orthostatics in the mornings upon awakening. Additionally, there is some concern for adherence to medications. Her home BP is often at goal. Will not make any medication changes at this time. Will need to continue to monitor closely as stated previously she is borderline diabetic and there is a contraindication in using tekturna with ARB in diabetic patients. Encouraged patient to continue to monitor BP at home and bring logs to follow up with Dr. Stanford Breed in one month. Will follow up in HTN clinic as needed.   This patient was seen with Ina Kick, [facility] PharmD Candidate 2018. I agree with the above plan as outlined.    Thank you, Lelan Pons. Patterson Hammersmith, [facility]  07/22/2016 3:11 PM

## 2016-07-22 NOTE — Patient Instructions (Signed)
Return for a follow up appointment as scheduled with Dr. Stanford Breed  Check your blood pressure at home daily (if able) and keep record of the readings.  Take your BP meds as follows: Continue medications as prescribed - call with any issues   Bring all of your meds, your BP cuff and your record of home blood pressures to your next appointment.  Exercise as you're able, try to walk approximately 30 minutes per day.  Keep salt intake to a minimum, especially watch canned and prepared boxed foods.  Eat more fresh fruits and vegetables and fewer canned items.  Avoid eating in fast food restaurants.    HOW TO TAKE YOUR BLOOD PRESSURE: . Rest 5 minutes before taking your blood pressure. .  Don't smoke or drink caffeinated beverages for at least 30 minutes before. . Take your blood pressure before (not after) you eat. . Sit comfortably with your back supported and both feet on the floor (don't cross your legs). . Elevate your arm to heart level on a table or a desk. . Use the proper sized cuff. It should fit smoothly and snugly around your bare upper arm. There should be enough room to slip a fingertip under the cuff. The bottom edge of the cuff should be 1 inch above the crease of the elbow. . Ideally, take 3 measurements at one sitting and record the average.

## 2016-07-29 DIAGNOSIS — I341 Nonrheumatic mitral (valve) prolapse: Secondary | ICD-10-CM | POA: Diagnosis not present

## 2016-07-29 DIAGNOSIS — E870 Hyperosmolality and hypernatremia: Secondary | ICD-10-CM | POA: Diagnosis not present

## 2016-07-29 DIAGNOSIS — E7800 Pure hypercholesterolemia, unspecified: Secondary | ICD-10-CM | POA: Diagnosis not present

## 2016-07-29 DIAGNOSIS — Z885 Allergy status to narcotic agent status: Secondary | ICD-10-CM | POA: Diagnosis not present

## 2016-07-29 DIAGNOSIS — I63511 Cerebral infarction due to unspecified occlusion or stenosis of right middle cerebral artery: Secondary | ICD-10-CM | POA: Diagnosis not present

## 2016-07-29 DIAGNOSIS — Z79899 Other long term (current) drug therapy: Secondary | ICD-10-CM | POA: Diagnosis not present

## 2016-07-29 DIAGNOSIS — I10 Essential (primary) hypertension: Secondary | ICD-10-CM | POA: Diagnosis not present

## 2016-07-29 DIAGNOSIS — K219 Gastro-esophageal reflux disease without esophagitis: Secondary | ICD-10-CM | POA: Diagnosis not present

## 2016-07-29 DIAGNOSIS — I509 Heart failure, unspecified: Secondary | ICD-10-CM | POA: Diagnosis not present

## 2016-07-29 DIAGNOSIS — E876 Hypokalemia: Secondary | ICD-10-CM | POA: Diagnosis not present

## 2016-07-29 DIAGNOSIS — I6350 Cerebral infarction due to unspecified occlusion or stenosis of unspecified cerebral artery: Secondary | ICD-10-CM | POA: Diagnosis not present

## 2016-07-29 DIAGNOSIS — M5412 Radiculopathy, cervical region: Secondary | ICD-10-CM | POA: Diagnosis not present

## 2016-07-29 DIAGNOSIS — I63541 Cerebral infarction due to unspecified occlusion or stenosis of right cerebellar artery: Secondary | ICD-10-CM | POA: Diagnosis not present

## 2016-07-29 DIAGNOSIS — R531 Weakness: Secondary | ICD-10-CM | POA: Diagnosis not present

## 2016-07-29 DIAGNOSIS — Z87891 Personal history of nicotine dependence: Secondary | ICD-10-CM | POA: Diagnosis not present

## 2016-07-30 ENCOUNTER — Telehealth: Payer: Self-pay

## 2016-07-30 DIAGNOSIS — Z9884 Bariatric surgery status: Secondary | ICD-10-CM | POA: Diagnosis not present

## 2016-07-30 DIAGNOSIS — I63511 Cerebral infarction due to unspecified occlusion or stenosis of right middle cerebral artery: Secondary | ICD-10-CM | POA: Diagnosis not present

## 2016-07-30 DIAGNOSIS — I110 Hypertensive heart disease with heart failure: Secondary | ICD-10-CM | POA: Diagnosis present

## 2016-07-30 DIAGNOSIS — I509 Heart failure, unspecified: Secondary | ICD-10-CM | POA: Diagnosis present

## 2016-07-30 DIAGNOSIS — I10 Essential (primary) hypertension: Secondary | ICD-10-CM | POA: Diagnosis not present

## 2016-07-30 DIAGNOSIS — E119 Type 2 diabetes mellitus without complications: Secondary | ICD-10-CM | POA: Diagnosis not present

## 2016-07-30 DIAGNOSIS — I341 Nonrheumatic mitral (valve) prolapse: Secondary | ICD-10-CM | POA: Diagnosis not present

## 2016-07-30 DIAGNOSIS — I638 Other cerebral infarction: Secondary | ICD-10-CM | POA: Diagnosis not present

## 2016-07-30 DIAGNOSIS — I5032 Chronic diastolic (congestive) heart failure: Secondary | ICD-10-CM | POA: Diagnosis not present

## 2016-07-30 DIAGNOSIS — Z886 Allergy status to analgesic agent status: Secondary | ICD-10-CM | POA: Diagnosis not present

## 2016-07-30 DIAGNOSIS — M5410 Radiculopathy, site unspecified: Secondary | ICD-10-CM | POA: Diagnosis present

## 2016-07-30 DIAGNOSIS — Z8673 Personal history of transient ischemic attack (TIA), and cerebral infarction without residual deficits: Secondary | ICD-10-CM | POA: Diagnosis not present

## 2016-07-30 DIAGNOSIS — E785 Hyperlipidemia, unspecified: Secondary | ICD-10-CM | POA: Diagnosis not present

## 2016-07-30 DIAGNOSIS — M542 Cervicalgia: Secondary | ICD-10-CM | POA: Diagnosis not present

## 2016-07-30 DIAGNOSIS — R51 Headache: Secondary | ICD-10-CM | POA: Diagnosis not present

## 2016-07-30 DIAGNOSIS — R471 Dysarthria and anarthria: Secondary | ICD-10-CM | POA: Diagnosis present

## 2016-07-30 DIAGNOSIS — R200 Anesthesia of skin: Secondary | ICD-10-CM | POA: Diagnosis not present

## 2016-07-30 DIAGNOSIS — H532 Diplopia: Secondary | ICD-10-CM | POA: Diagnosis not present

## 2016-07-30 DIAGNOSIS — Z7982 Long term (current) use of aspirin: Secondary | ICD-10-CM | POA: Diagnosis not present

## 2016-07-30 DIAGNOSIS — R2990 Unspecified symptoms and signs involving the nervous system: Secondary | ICD-10-CM | POA: Diagnosis not present

## 2016-07-30 DIAGNOSIS — G8191 Hemiplegia, unspecified affecting right dominant side: Secondary | ICD-10-CM | POA: Diagnosis present

## 2016-07-30 DIAGNOSIS — R4781 Slurred speech: Secondary | ICD-10-CM | POA: Diagnosis not present

## 2016-07-30 DIAGNOSIS — M25511 Pain in right shoulder: Secondary | ICD-10-CM | POA: Diagnosis not present

## 2016-07-30 DIAGNOSIS — Z87891 Personal history of nicotine dependence: Secondary | ICD-10-CM | POA: Diagnosis not present

## 2016-07-30 DIAGNOSIS — R531 Weakness: Secondary | ICD-10-CM | POA: Diagnosis not present

## 2016-07-30 NOTE — Telephone Encounter (Signed)
[name] complains of neck pain. She was seen at [facility] and had a MRI Cervical Spine. Is this something you can treat? Please advise.    MRI/Cervical Spine 07/29/2016 IMPRESSION: 1.Multilevel DDD and facet arthrosis. There is mild narrowing of the spinal canal and minimal/mild impingement of the spinal cord at multiple levels. No frank cord compression. Multilevel foraminal stenosis also noted, worse on the left. Please see  detail above. 2.No acute bony abnormality. 3.No convincing intrinsic spinal cord abnormality..  Result Narrative  INDICATION: Right arm radiculopathy.  COMPARISON: None  TECHNIQUE: Multiplanar, multisequence MR imaging obtained through the cervical spine without IV contrast. Contrast: None given  FINDINGS: #Osseous structures: No acute fracture or worrisome marrow signal abnormality. #Alignment: Loss of the normal cervical lordosis. #Cervicomedullary junction: Normal. No cerebellar tonsillar ectopia. #Spinal cord: No intrinsic spinal cord abnormality.  #C2-C3: Preservation of disc height. No focal disc herniation or significant spinal canal stenosis. Mild facet arthrosis without significant foraminal stenosis. #C3-C4: Mild loss of disc height. There is a small broad-based right paracentral disc protrusion that contacts and minimally indents the ventral spinal cord. Preservation of the posterior subarachnoid space. Mild uncovertebral spurring and facet  arthrosis. No significant foraminal stenosis. #C4-C5: Mild loss of disc height. Minimal broad-based posterior disc osteophyte complex contacts but does not appear to displace or deform the spinal cord. Preservation of the posterior subarachnoid space. Mild uncovertebral spurring and facet  arthrosis. No significant foraminal stenosis. #C5-C6: Moderate loss of disc height. Small broad-based posterior disc osteophyte complex contacts and mildly indents the ventral spinal cord.  Preservation of the posterior subarachnoid space. Bilateral uncovertebral spurring and facet arthrosis. Mild  right and severe left foraminal stenosis. #C6-C7: Moderate loss of disc height. Small broad-based posterior disc osteophyte complex contacts and minimally indents the ventral spinal cord. Preservation of the posterior subarachnoid space. Bilateral uncovertebral spurring and facet arthrosis.  Moderate right and severe left foraminal stenosis. #C7-T1: Mild loss of disc height. No focal disc herniation or significant spinal canal stenosis. Mild right and severe left facet arthrosis. Severe left foraminal stenosis.  #Paraspinal tissues: Unremarkable  #Postcontrast images: None given.  #Additional comments

## 2016-07-30 NOTE — Telephone Encounter (Signed)
Yes, she needs to make sure to get her MRI burned to a CD so that I can look at the images.

## 2016-07-30 NOTE — Telephone Encounter (Signed)
[name] advised to go to the ED. She stated she was going to [place].

## 2016-07-30 NOTE — Telephone Encounter (Signed)
[name] called and states she went to [facility] ED in [place] and was advised she had a stroke. After 9 hours of just laying there and no treatment she walked out. She would like [name] recommendations of what she should do next.    MRI/Brain 07/29/2016 IMPRESSION: 1.Age advanced chronic ischemic changes and old right cerebral and right cerebellar infarcts. 2.Small acute/recent infarct posterior right frontal lobe. No associated hemorrhage or mass effect.  Result Narrative  INDICATION: slurred speech 3 days ago.  COMPARISON: None  TECHNIQUE: Multiplanar, multisequence MR imaging of the brain was obtained without IV contrast.  FINDINGS: #Skull/marrow/soft tissues: Unremarkable. #Orbits: Unremarkable. #Sinuses: Unremarkable. #Brain: Overall parenchymal volume within normal limits for age. Fairly extensive patchy and confluent areas of increased T2/FLAIR hyperintensity present within the periventricular/subcortical white matter consistent with age advanced chronic ischemic  changes. Tiny old infarcts within the deep/periventricular white matter right parietal lobe. Small old infarcts also noted within the lateral right cerebellum. There is a small focus of restricted diffusion within the subcortical white matter posterior  right frontal lobe consistent with an acute/recent infarct. No associated hemorrhage or mass effect. Diffusion weighted images otherwise unremarkable. No mass, hemorrhage, midline shift, hydrocephalus or extra-axial collections. Flow is demonstrated  within the major vessels at the base of the brain...  #Additional comments: None.

## 2016-07-31 DIAGNOSIS — I63511 Cerebral infarction due to unspecified occlusion or stenosis of right middle cerebral artery: Secondary | ICD-10-CM | POA: Insufficient documentation

## 2016-07-31 NOTE — Telephone Encounter (Signed)
Good, will look out for report.

## 2016-08-01 NOTE — Telephone Encounter (Signed)
Waiting for patient to be DC from the hospital. She had a stroke.

## 2016-08-08 NOTE — Telephone Encounter (Signed)
Patient scheduled and advised to pick up a CD copy of the MRI cervical spine.

## 2016-08-09 ENCOUNTER — Encounter: Payer: Self-pay | Admitting: Sports Medicine

## 2016-08-09 ENCOUNTER — Ambulatory Visit (INDEPENDENT_AMBULATORY_CARE_PROVIDER_SITE_OTHER): Payer: Federal, State, Local not specified - PPO | Admitting: Sports Medicine

## 2016-08-09 DIAGNOSIS — Z8673 Personal history of transient ischemic attack (TIA), and cerebral infarction without residual deficits: Secondary | ICD-10-CM

## 2016-08-09 DIAGNOSIS — M5412 Radiculopathy, cervical region: Secondary | ICD-10-CM

## 2016-08-09 HISTORY — DX: Radiculopathy, cervical region: M54.12

## 2016-08-09 MED ORDER — MELOXICAM 15 MG PO TABS: ORAL_TABLET | ORAL | 3 refills | Status: DC

## 2016-08-09 MED ORDER — PREDNISONE 50 MG PO TABS
ORAL_TABLET | ORAL | 0 refills | Status: DC
Start: 2016-08-09 — End: 2016-08-15

## 2016-08-09 NOTE — Assessment & Plan Note (Signed)
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

## 2016-08-09 NOTE — Assessment & Plan Note (Signed)
Starting with prednisone, meloxicam, formal physical therapy. Return in one month, interventional planning if no better.

## 2016-08-09 NOTE — Progress Notes (Signed)
   Subjective:    I'm seeing this patient as a consultation for:  Dr. Beatrice Lecher  CC: Neck and right arm pain  HPI: This is a pleasant 60 year old female, she has had right-sided cerebellar and frontal lobe infarcts, more recently she's had increasing neck pain, had a cervical spine MRI ordered when she was in the hospital by an outside provider. She is referred to me for further evaluation and definitive treatment, pain is worse with most movements of the neck, particularly rotation to the right with radiation in the periscapular region and down the back of the right arm. Severe, persistent, no loss of bowel or bladder function, no constitutional symptoms, no recent trauma.  Past medical history:  Negative.  See flowsheet/record as well for more information.  Surgical history: Negative.  See flowsheet/record as well for more information.  Family history: Negative.  See flowsheet/record as well for more information.  Social history: Negative.  See flowsheet/record as well for more information.  Allergies, and medications have been entered into the medical record, reviewed, and no changes needed.   Review of Systems: No headache, visual changes, nausea, vomiting, diarrhea, constipation, dizziness, abdominal pain, skin rash, fevers, chills, night sweats, weight loss, swollen lymph nodes, body aches, joint swelling, muscle aches, chest pain, shortness of breath, mood changes, visual or auditory hallucinations.   Objective:   General: Well Developed, well nourished, and in no acute distress.  Neuro/Psych: Alert and oriented x3, extra-ocular muscles intact, able to move all 4 extremities, sensation grossly intact. Skin: Warm and dry, no rashes noted.  Respiratory: Not using accessory muscles, speaking in full sentences, trachea midline.  Cardiovascular: Pulses palpable, no extremity edema. Abdomen: Does not appear distended. Neck: Negative spurling's Full neck range of motion Grip  strength and sensation normal in bilateral hands Strength good C4 to T1 distribution No sensory change to C4 to T1 Reflexes normal  Cervical spine MRI personally reviewed and shows C5-6 and C6-7 central disc protrusions with central spinal stenosis but no frank cord compression.  Impression and Recommendations:   This case required medical decision making of moderate complexity.  Right cervical radiculopathy Starting with prednisone, meloxicam, formal physical therapy. Return in one month, interventional planning if no better.  History of CVA (cerebrovascular accident) Right posterior frontal lobe and right cerebellar infarcts.

## 2016-08-15 ENCOUNTER — Other Ambulatory Visit: Payer: Self-pay | Admitting: Cardiology

## 2016-08-15 ENCOUNTER — Encounter: Payer: Self-pay | Admitting: Family Medicine

## 2016-08-15 ENCOUNTER — Ambulatory Visit (INDEPENDENT_AMBULATORY_CARE_PROVIDER_SITE_OTHER): Payer: Federal, State, Local not specified - PPO | Admitting: Family Medicine

## 2016-08-15 VITALS — BP 130/86 | HR 78 | Ht 66.0 in | Wt 188.0 lb

## 2016-08-15 DIAGNOSIS — L723 Sebaceous cyst: Secondary | ICD-10-CM | POA: Diagnosis not present

## 2016-08-15 DIAGNOSIS — M5412 Radiculopathy, cervical region: Secondary | ICD-10-CM

## 2016-08-15 DIAGNOSIS — I341 Nonrheumatic mitral (valve) prolapse: Secondary | ICD-10-CM | POA: Insufficient documentation

## 2016-08-15 DIAGNOSIS — I63511 Cerebral infarction due to unspecified occlusion or stenosis of right middle cerebral artery: Secondary | ICD-10-CM | POA: Diagnosis not present

## 2016-08-15 DIAGNOSIS — I509 Heart failure, unspecified: Secondary | ICD-10-CM

## 2016-08-15 DIAGNOSIS — T148XXA Other injury of unspecified body region, initial encounter: Secondary | ICD-10-CM | POA: Insufficient documentation

## 2016-08-15 LAB — COMPLETE METABOLIC PANEL WITH GFR
ALT: 19 U/L (ref 6–29)
AST: 22 U/L (ref 10–35)
Albumin: 4.1 g/dL (ref 3.6–5.1)
Alkaline Phosphatase: 92 U/L (ref 33–130)
BUN: 13 mg/dL (ref 7–25)
CHLORIDE: 106 mmol/L (ref 98–110)
CO2: 24 mmol/L (ref 20–31)
Calcium: 9.2 mg/dL (ref 8.6–10.4)
Creat: 0.84 mg/dL (ref 0.50–1.05)
GFR, Est African American: 88 mL/min (ref >=60)
GFR, Est Non African American: 76 mL/min (ref >=60)
Glucose, Bld: 85 mg/dL (ref 65–99)
POTASSIUM: 3.8 mmol/L (ref 3.5–5.3)
Sodium: 140 mmol/L (ref 135–146)
Total Bilirubin: 0.7 mg/dL (ref 0.2–1.2)
Total Protein: 6.9 g/dL (ref 6.1–8.1)

## 2016-08-15 NOTE — Progress Notes (Addendum)
Subjective:    Patient ID: [name], female    DOB: 1956-02-17, 60 y.o.   MRN: [id number]  HPI 60 year old comes in today to follow-up from recent hospital visit. She went to the emergency room on October 23 complaining of some neck pain with radicular symptoms as well as some slurred speech, double vision. She does have a prior history of stroke. They did do an MRI which did not show any type of aneurysm. It did show incidental ischemic scrotal stroke in the right MCA territory which was likely caused by small vessel disease .  She left the hospital AMA, and then actually went back the following day and was admitted.  She also had MRI of the cervical spine performed with the following results: MRI CERVICAL SPINE WO CONTRAST  Narrative:  INDICATION: Right arm radiculopathy.  COMPARISON: None  TECHNIQUE: Multiplanar, multisequence MR imaging obtained through the cervical spine without IV contrast. Contrast: None given  FINDINGS: # Osseous structures: No acute fracture or worrisome marrow signal abnormality. # Alignment: Loss of the normal cervical lordosis. # Cervicomedullary junction: Normal. No cerebellar tonsillar ectopia. # Spinal cord: No intrinsic spinal cord abnormality.  # C2-C3: Preservation of disc height. No focal disc herniation or significant spinal canal stenosis. Mild facet arthrosis without significant foraminal stenosis. # C3-C4: Mild loss of disc height. There is a small broad-based right paracentral disc protrusion that contacts and minimally indents the ventral spinal cord. Preservation of the posterior subarachnoid space. Mild uncovertebral spurring and facet  arthrosis. No significant foraminal stenosis. # C4-C5: Mild loss of disc height. Minimal broad-based posterior disc osteophyte complex contacts but does not appear to displace or deform the spinal cord. Preservation of the posterior subarachnoid space. Mild uncovertebral spurring and facet  arthrosis. No  significant foraminal stenosis. # C5-C6: Moderate loss of disc height. Small broad-based posterior disc osteophyte complex contacts and mildly indents the ventral spinal cord. Preservation of the posterior subarachnoid space. Bilateral uncovertebral spurring and facet arthrosis. Mild  right and severe left foraminal stenosis. # C6-C7: Moderate loss of disc height. Small broad-based posterior disc osteophyte complex contacts and minimally indents the ventral spinal cord. Preservation of the posterior subarachnoid space. Bilateral uncovertebral spurring and facet arthrosis.  Moderate right and severe left foraminal stenosis. # C7-T1: Mild loss of disc height. No focal disc herniation or significant spinal canal stenosis. Mild right and severe left facet arthrosis. Severe left foraminal stenosis.  # Paraspinal tissues: Unremarkable  # Postcontrast images: None given.  # Additional comments: None.  Impression:  IMPRESSION: 1. Multilevel DDD and facet arthrosis. There is mild narrowing of the spinal canal and minimal/mild impingement of the spinal cord at multiple levels. No frank cord compression. Multilevel foraminal stenosis also noted, worse on the left. Please see  detail above. 2. No acute bony abnormality. 3. No convincing intrinsic spinal cord abnormality..   They changed her pravastain to 80mg  of lipitor.  They discontinued her Tekturna. They put her on Azor.   She is feeling very tired but o/w doing OK. Does need refills today.  She says that she gets tired she just lays down and tries to rest and take it easy.  She did see sports medicine earlier this week for her degenerative disc disease of the cervical spine. It's quite severe and she still having a lot of pain. He started her on an NSAID and prednisone. And is getting her scheduled with physical therapy. She has not heard back from them yet.  She does have a nodule on the right side of her neck just at the shoulder. She says it  really doesn't bother her. She has several similar lesions under both axilla. She said she's had take 1 taken off performed of the left arm and it was benign. She thinks it's just the type of pimple.  She was told to f/u with Neurology. She has an appt on Monday.  Follow-up Appointments [facility] [place]  Appointment Date and Time Provider Department Dept Phone Address  08/19/2016 11:15 AM [place]., MD [facility] [phone] [address] [place] [address]    Review of Systems     Objective:   Physical Exam  Constitutional: She is oriented to person, place, and time. She appears well-developed and well-nourished.  HENT:  Head: Normocephalic and atraumatic.  Cardiovascular: Normal rate, regular rhythm and normal heart sounds.   Pulmonary/Chest: Effort normal and breath sounds normal.  Musculoskeletal: She exhibits no edema.  Neurological: She is alert and oriented to person, place, and time.  Skin: Skin is warm and dry.  She does have an enlarged sebaceous cyst on the right side of the base the neck. It's approximately 1 x 2 cm in size. She has a few scattered small sebaceous cyst under both axilla.  Psychiatric: She has a normal mood and affect. Her behavior is normal.       Assessment & Plan:  Acute ischemic right MCA stroke-she is improving. Speech seems very normal today. She says she's just been tired.  Congestive heart failure-they discharged her home on 3 diuretics, hydrochlorothiazide, spironolactone, and furosemide. Check a BMP today just to make sure potassium and renal function are stable. We might be able to stop the hydrochlorothiazide and just adjust her furosemide instead. Also want to make sure with the recent addition of prednisone and an NSAID that her renal function is doing okay.  Right cervical radiculopathy with nerve compression-we did call physical therapy department.  She can stop dominant on her way out today to schedule her appointment.  Sebaceous cysts-discussed diagnosis. These are benign but I do recommend removal as they can continue to enlarge. Recommend that we address this again may be in January and consider referral to general surgery for removal.

## 2016-08-19 DIAGNOSIS — G43719 Chronic migraine without aura, intractable, without status migrainosus: Secondary | ICD-10-CM | POA: Diagnosis not present

## 2016-08-19 DIAGNOSIS — I6322 Cerebral infarction due to unspecified occlusion or stenosis of basilar arteries: Secondary | ICD-10-CM | POA: Diagnosis not present

## 2016-08-19 DIAGNOSIS — G4440 Drug-induced headache, not elsewhere classified, not intractable: Secondary | ICD-10-CM | POA: Diagnosis not present

## 2016-08-19 NOTE — Progress Notes (Signed)
All labs are normal. 

## 2016-08-20 ENCOUNTER — Ambulatory Visit (INDEPENDENT_AMBULATORY_CARE_PROVIDER_SITE_OTHER): Payer: Federal, State, Local not specified - PPO | Admitting: Physical Therapy

## 2016-08-20 DIAGNOSIS — M436 Torticollis: Secondary | ICD-10-CM

## 2016-08-20 DIAGNOSIS — M6281 Muscle weakness (generalized): Secondary | ICD-10-CM

## 2016-08-20 DIAGNOSIS — M25611 Stiffness of right shoulder, not elsewhere classified: Secondary | ICD-10-CM | POA: Diagnosis not present

## 2016-08-20 DIAGNOSIS — R52 Pain, unspecified: Secondary | ICD-10-CM

## 2016-08-20 NOTE — Therapy (Signed)
[place], [place], [address] Phone: [phone]   Fax:  [phone]  Physical Therapy Evaluation  Patient Details  Name: [name] MRN: [id number] Date of Birth: [birth year] Referring Provider: Silverio Decamp, MD  Encounter Date: 08/20/2016      PT End of Session - 08/20/16 1305    Visit Number 1   Number of Visits 12   Date for PT Re-Evaluation 10/01/16   PT Start Time 1027  Pt late for session   PT Stop Time 1102   PT Time Calculation (min) 35 min   Activity Tolerance Patient tolerated treatment well   Behavior During Therapy [facility] for tasks assessed/performed      Past Medical History:  Diagnosis Date  . Bipolar disorder ([place])   . CHF (congestive heart failure) ([place])   . Chronic diastolic heart failure (HCC)    hospitalized at [facility]  . Complication of anesthesia    had "breathing problems and ended up in ICU" 03/2010 report on chart  . DDD (degenerative disc disease), lumbar   . 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 GERD (gastroesophageal reflux disease)   . Glucose intolerance (impaired glucose tolerance)   . HLD (hyperlipidemia)    nec/nos  . HTN (hypertension)    benign essential  . MVP (mitral valve prolapse)   . Narcolepsy   . Suicide attempt    hx  . Syncope    admx to [facility] in 2/12: Echo with normal LVF; head CT unremarkable, ECG stable, no further w/u  . Ulcer [facility])     Past Surgical History:  Procedure Laterality Date  . BLADDER SUSPENSION    . GASTRIC BYPASS    . LUMBAR LAMINECTOMY/DECOMPRESSION MICRODISCECTOMY  09/11/2011   Procedure: LUMBAR LAMINECTOMY/DECOMPRESSION MICRODISCECTOMY;  Surgeon: Tobi Bastos;  Location: [facility];  Service: Orthopedics;  Laterality: Right;  Hemi Laminectomy/Microdiscectomy L5 - S1 on the Right (X-Ray)  . PARTIAL HYSTERECTOMY    . REPAIR PERONEAL TENDONS ANKLE      There were no vitals filed for  this visit.       Subjective Assessment - 08/20/16 1031    Subjective Woke up one day with neck pain and she tried to take care of it on her own with ongoing headaches. Eventually the pt went to the hospital and an MRI was performed. Results indicated that she had a disc herniation as well as with an acute ischemic MCA stroke.    Pertinent History ischemic MCA stroke, CHF, HTN, degenerative disc disorder, narcolepsy, bipolar disorder.   Limitations Sitting;Walking;Standing;Lifting   How long can you sit comfortably? 5-10 minutes   How long can you stand comfortably? 5-10 minutes   How long can you walk comfortably? 5 minutes   Patient Stated Goals Less pain and avoid surgery   Currently in Pain? Yes   Pain Score 7    Pain Location Neck   Pain Orientation Right   Pain Descriptors / Indicators Aching  "bad crook"   Pain Type Acute pain   Pain Radiating Towards into Right arm and the whole hand (numbess)   Pain Onset 1 to 4 weeks ago   Pain Frequency Constant   Aggravating Factors  sitting, lifting, moving.   Pain Relieving Factors nothing yet   Effect of Pain on Daily Activities trouble lifting, looking around.             [facility] PT Assessment - 08/20/16  0001      Assessment   Medical Diagnosis right cervical radiculopathy   Referring Provider Silverio Decamp, MD   Onset Date/Surgical Date 07/24/16   Hand Dominance Left   Next MD Visit December   Prior Therapy For prior back surgery     Precautions   Precaution Comments recent CVA with reported right sided weakness.     Balance Screen   Has the patient fallen in the past 6 months No     [place] residence   Living Arrangements Spouse/significant other     Prior Function   Vocation On disability  narcolepsy     Observation/Other Assessments   Observations forward head/rounded shoulders   Focus on Therapeutic Outcomes (FOTO)  70% limitation     Sensation   Light Touch  Appears Intact  denies numbness at this time.      AROM   Right Shoulder Flexion 110 Degrees  pain   Left Shoulder Flexion 150 Degrees   Cervical Flexion 23 degrees  rt neck pain   Cervical Extension 13 degrees  rt neck pain   Cervical - Right Side Bend 20 degrees  rt neck pain   Cervical - Left Side Bend 33 degrees  rt neck pain   Cervical - Right Rotation 40 degrees  rt neck pain   Cervical - Left Rotation 38 degrees     Strength   Right Shoulder Flexion 3-/5  rt neck pain   Right Elbow Flexion 3+/5  rt neck pain   Right Elbow Extension 3+/5  rt neck pain   Right Wrist Flexion 4/5   Right Wrist Extension 4/5   Right Hand Grip (lbs) 7   Left Hand Grip (lbs) 50   Cervical Flexion 2+/5  rt neck pain   Cervical Extension 3-/5  rt neck pain   Cervical - Right Side Bend 3-/5  rt neck pain   Cervical - Left Side Bend 4/5     Palpation   Palpation comment Tender to palpation along Rt upper trap, levator scapulae, rhomboids, deltoid     Transfers   Transfers --                           PT Education - 08/20/16 1305    Education provided Yes   Education Details General PT plan of care   Person(s) Educated Patient   Methods Explanation   Comprehension Verbalized understanding             PT Long Term Goals - 08/20/16 1319      PT LONG TERM GOAL #1   Title Pt to be independent with HEP for cervical ROM/strengthening. 10/01/16   Time 6   Period Weeks   Status New     PT LONG TERM GOAL #2   Title FOTO score to be =/< 39% 10/01/16   Time 6   Period Weeks   Status New     PT LONG TERM GOAL #3   Title Pt to demonstrate 20 degree improvement in cervical rotation bilaterally for environmental scanning. 10/01/16   Time 6   Period Weeks   Status New     PT LONG TERM GOAL #4   Title Pt to report 50% or better improvement in her pain for activity tolerance. 10/01/16   Time 6   Period Weeks   Status New     PT LONG TERM GOAL #5  Title Pt to demonstrate Rt shoulder strength to 4/5 for overhead reaching tasks. 10/01/16   Time 6   Period Weeks   Status New               Plan - 08/20/16 1307    Clinical Impression Statement Pt presenting to OPPT services with reports of ongoing neck pain which began on 07/24/16. She describes that she attempted to manage her pain on her own but eventually went to the hospital for further evaluation which revealed that she had an acute ischemic MCA stroke. Pt now presents with ongoing cervical and Rt UE pain. Evaluation reveals significantly decreased cervical and Rt UE ROM and strength. Neurologically she has reports numbness through her Rt UE but not at the time of this evaluation. Pt is appropriate for ongoing PT sessions to work on decreasing pain, improving motion/strength as well as overall function.    Rehab Potential Good   Clinical Impairments Affecting Rehab Potential recent CVA   PT Frequency 2x / week   PT Duration 6 weeks   PT Treatment/Interventions ADLs/Self Care Home Management;Cryotherapy;Electrical Stimulation;Iontophoresis 4mg /ml Dexamethasone;Moist Heat;Traction;Ultrasound;Functional mobility training;Patient/family education;Neuromuscular re-education;Therapeutic exercise;Therapeutic activities;Manual techniques;Taping;Dry needling   PT Next Visit Plan Initiate treatment, cervical ROM/stretches, manual therapy/modalities as needed. (unable to start at eval due to limited time)   Consulted and Agree with Plan of Care Patient      Patient will benefit from skilled therapeutic intervention in order to improve the following deficits and impairments:  Decreased activity tolerance, Decreased range of motion, Decreased strength, Postural dysfunction, Impaired flexibility, Pain, Impaired UE functional use, Increased muscle spasms  Visit Diagnosis: Muscle weakness (generalized) - Plan: PT plan of care cert/re-cert  Stiffness of right shoulder, not elsewhere classified -  Plan: PT plan of care cert/re-cert  Stiffness of neck - Plan: PT plan of care cert/re-cert  Acute pain - Plan: PT plan of care cert/re-cert     Problem List Patient Active Problem List   Diagnosis Date Noted  . Mitral valve prolapse syndrome 08/15/2016  . Nerve compression 08/15/2016  . Congestive heart failure ([place]) 08/15/2016  . Right cervical radiculopathy 08/09/2016  . Acute ischemic right MCA stroke ([place]) 07/31/2016  . Essential hypertension, malignant 06/12/2016  . History of CVA (cerebrovascular accident) 03/13/2016  . 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 Narcolepsy 03/13/2013  . Coronary atherosclerosis 03/13/2013  . Syncope and collapse 08/08/2011  . DDD (degenerative disc disease), lumbar   . HIRSUTISM 06/02/2009  . BIPOLAR DISORDER UNSPECIFIED 05/17/2009  . DYSPNEA 05/17/2009  . SLEEP DISORDER 08/31/2008  . Pain in limb 08/03/2008  . Abnormal blood sugar 12/22/2007  . Hyperlipidemia 07/15/2007  . FATIGUE 07/15/2007    Linard Millers, PT, CSCS 08/20/2016, 1:27 PM  [facility] [place], [place], [address] Phone: [phone]   Fax:  [phone]  Name: TERAJI PALUCK MRN: [id number] Date of Birth: 1955/12/11

## 2016-08-22 NOTE — Progress Notes (Signed)
HPI: FU hypertension. Previous renal Dopplers were technically difficult. There was no stenosis on the left and the right was not well visualized. Follow up CTA to rule out renal artery stenosis was done and was negative. Last Myoview in November 2012 showed an ejection fraction of 71%, breast attenuation but no ischemia. Echocardiogram July 2017 showed normal LV systolic function and grade 1 diastolic dysfunction. Admitted to [facility] October 2017; echocardiogram showed ejection fraction 65-70% and no PFO. Left atrium mildly dilated. Since last seen, she has mild dyspnea on exertion but no orthopnea, PND, pedal edema, chest pain or syncope.  Current Outpatient Prescriptions  Medication Sig Dispense Refill  . amLODipine-olmesartan (AZOR) 10-40 MG tablet TAKE ONE TABLET BY MOUTH ONCE DAILY 90 tablet 2  . atorvastatin (LIPITOR) 80 MG tablet Take 80 mg by mouth at bedtime.    . clonazePAM (KLONOPIN) 1 MG tablet Take 1 mg by mouth 3 (three) times daily.     . cloNIDine (CATAPRES) 0.2 MG tablet TAKE TWO TABLETS BY MOUTH THREE TIMES DAILY -  NEEDS  OFFICE  VISIT 180 tablet 0  . DULoxetine (CYMBALTA) 60 MG capsule Take 60 mg by mouth every evening.     [name] EQ ASPIRIN 325 MG tablet Take 325 mg by mouth daily.    [name] esomeprazole (NEXIUM) 40 MG capsule Take 40 mg by mouth 2 (two) times daily as needed. 1 capsule daily scheduled, will take an additional capsule if needed    . folic acid (FOLVITE) 1 MG tablet Take 1 mg by mouth daily.     . furosemide (LASIX) 20 MG tablet Take 1 tablet (20 mg total) by mouth daily. 30 tablet 1  . hydrALAZINE (APRESOLINE) 100 MG tablet Take 1 tablet (100 mg total) by mouth 3 (three) times daily. 90 tablet 1  . ipratropium (ATROVENT) 0.03 % nasal spray Place 2 sprays into both nostrils 2 (two) times daily as needed (nasal congestion, ear discomfort). 30 mL 0  . labetalol (NORMODYNE) 200 MG tablet Take 1 tablet (200 mg total) by mouth 2 (two) times daily. 60 tablet 11   . meloxicam (MOBIC) 15 MG tablet One tab PO qAM with breakfast for 2 weeks, then daily prn pain. 30 tablet 3  . Multiple Vitamins-Minerals (MULTIVITAMINS THER. W/MINERALS) TABS Take 1 tablet by mouth daily.      [name] omeprazole (PRILOSEC) 20 MG capsule Take 2 capsules (40 mg total) by mouth daily. 30 capsule 0  . spironolactone (ALDACTONE) 50 MG tablet Take 1 tablet (50 mg total) by mouth daily. 90 tablet 2  . VENTOLIN HFA 108 (90 BASE) MCG/ACT inhaler prn     No current facility-administered medications for this visit.      Past Medical History:  Diagnosis Date  . Bipolar disorder ([place])   . CHF (congestive heart failure) ([place])   . Chronic diastolic heart failure (HCC)    hospitalized at [facility]  . Complication of anesthesia    had "breathing problems and ended up in ICU" 03/2010 report on chart  . DDD (degenerative disc disease), lumbar   . 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 GERD (gastroesophageal reflux disease)   . Glucose intolerance (impaired glucose tolerance)   . HLD (hyperlipidemia)    nec/nos  . HTN (hypertension)    benign essential  . MVP (mitral valve prolapse)   . Narcolepsy   . Suicide attempt    hx  . Syncope    admx to  [facility] in 2/12: Echo with normal LVF; head CT unremarkable, ECG stable, no further w/u  . Ulcer [facility])     Past Surgical History:  Procedure Laterality Date  . BLADDER SUSPENSION    . GASTRIC BYPASS    . LUMBAR LAMINECTOMY/DECOMPRESSION MICRODISCECTOMY  09/11/2011   Procedure: LUMBAR LAMINECTOMY/DECOMPRESSION MICRODISCECTOMY;  Surgeon: Tobi Bastos;  Location: [facility];  Service: Orthopedics;  Laterality: Right;  Hemi Laminectomy/Microdiscectomy L5 - S1 on the Right (X-Ray)  . PARTIAL HYSTERECTOMY    . REPAIR PERONEAL TENDONS ANKLE      Social History   Social History  . Marital status: Married    Spouse name: N/A  . Number of children: N/A  . Years of education: N/A   Occupational History  . Not on file.    Social History Main Topics  . Smoking status: Former Smoker    Quit date: 10/08/2003  . Smokeless tobacco: Never Used  . Alcohol use Yes     Comment: rare  . Drug use: No  . Sexual activity: Not on file   Other Topics Concern  . Not on file   Social History Narrative   Regularly exercises.     Family History  Problem Relation Age of Onset  . Heart attack Father 91  . Hypertension Father   . Diabetes Mother   . Hypertension Mother   . Stroke Other     grandmother    ROS: no fevers or chills, productive cough, hemoptysis, dysphasia, odynophagia, melena, hematochezia, dysuria, hematuria, rash, seizure activity, orthopnea, PND, pedal edema, claudication. Remaining systems are negative.  Physical Exam: Well-developed well-nourished in no acute distress.  Skin is warm and dry.  HEENT is normal.  Neck is supple.  Chest is clear to auscultation with normal expansion.  Cardiovascular exam is regular rate and rhythm.  Abdominal exam nontender or distended. No masses palpated. Extremities show no edema. neuro grossly intact  A/P  1 Hypertension-blood pressure is mildly elevated. Increase labetalol to 300 mg twice a day and follow.   2 dyspnea-previous evaluation negative. No plans for further cardiac evaluation at this point.  3 hyperlipidemia-management per primary care.   Kirk Ruths, MD

## 2016-08-23 ENCOUNTER — Ambulatory Visit (INDEPENDENT_AMBULATORY_CARE_PROVIDER_SITE_OTHER): Payer: Federal, State, Local not specified - PPO | Admitting: Physical Therapy

## 2016-08-23 DIAGNOSIS — M436 Torticollis: Secondary | ICD-10-CM

## 2016-08-23 DIAGNOSIS — R52 Pain, unspecified: Secondary | ICD-10-CM | POA: Diagnosis not present

## 2016-08-23 DIAGNOSIS — M25611 Stiffness of right shoulder, not elsewhere classified: Secondary | ICD-10-CM | POA: Diagnosis not present

## 2016-08-23 DIAGNOSIS — M6281 Muscle weakness (generalized): Secondary | ICD-10-CM | POA: Diagnosis not present

## 2016-08-23 NOTE — Therapy (Signed)
[address] [place], [place], [address] Phone: [phone]   Fax:  [phone]  Physical Therapy Treatment  Patient Details  Name: [name] MRN: [id number] Date of Birth: 07/09/1956 Referring Provider: Dr. Helane Rima  Encounter Date: 08/23/2016      PT End of Session - 08/23/16 0944    Visit Number 2   Number of Visits 12   Date for PT Re-Evaluation 10/01/16   PT Start Time 0921  pt arrived early   PT Stop Time 1014   PT Time Calculation (min) 53 min   Activity Tolerance Patient limited by pain      Past Medical History:  Diagnosis Date  . Bipolar disorder ([place])   . CHF (congestive heart failure) ([place])   . Chronic diastolic heart failure (HCC)    hospitalized at [facility] regional  . Complication of anesthesia    had "breathing problems and ended up in ICU" 03/2010 report on chart  . DDD (degenerative disc disease), lumbar   . 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 GERD (gastroesophageal reflux disease)   . Glucose intolerance (impaired glucose tolerance)   . HLD (hyperlipidemia)    nec/nos  . HTN (hypertension)    benign essential  . MVP (mitral valve prolapse)   . Narcolepsy   . Suicide attempt    hx  . Syncope    admx to [facility] in 2/12: Echo with normal LVF; head CT unremarkable, ECG stable, no further w/u  . Ulcer [facility])     Past Surgical History:  Procedure Laterality Date  . BLADDER SUSPENSION    . GASTRIC BYPASS    . LUMBAR LAMINECTOMY/DECOMPRESSION MICRODISCECTOMY  09/11/2011   Procedure: LUMBAR LAMINECTOMY/DECOMPRESSION MICRODISCECTOMY;  Surgeon: Tobi Bastos;  Location: [facility];  Service: Orthopedics;  Laterality: Right;  Hemi Laminectomy/Microdiscectomy L5 - S1 on the Right (X-Ray)  . PARTIAL HYSTERECTOMY    . REPAIR PERONEAL TENDONS ANKLE      There were no vitals filed for this visit.      Subjective Assessment - 08/23/16 0933    Subjective "I just  wish I could pop my neck and make it feel better".   She has been using heat for pain relief.    Currently in Pain? Yes   Pain Score 6    Pain Location Neck   Pain Orientation Right   Pain Descriptors / Indicators Aching            [facility] PT Assessment - 08/23/16 0001      Assessment   Medical Diagnosis right cervical radiculopathy   Referring Provider Dr. Helane Rima   Onset Date/Surgical Date 07/24/16   Hand Dominance Left   Next MD Visit December   Prior Therapy For prior back surgery          [facility] Adult PT Treatment/Exercise - 08/23/16 0001      Exercises   Exercises Neck     Neck Exercises: Supine   Cervical Rotation 5 reps;Right;Left   Other Supine Exercise decompress series:   scap squeeze x3 sec x 10, head press x 3sec x 8 reps    Other Supine Exercise chin tucks x 3 sec x 8 reps;  unilateral snow angel to tolerance x 8 reps      Modalities   Modalities Electrical Stimulation;Traction     Electrical Stimulation   Electrical Stimulation Location Rt levator, cervical/thoracic paraspinals.    Electrical Stimulation Action IFC  Electrical Stimulation Parameters  to tolerance    Electrical Stimulation Goals Pain     Traction   Type of Traction Cervical   Min (lbs) 6   Max (lbs) 12   Hold Time 60   Rest Time 20   Time 13     Manual Therapy   Manual Therapy Manual Traction;Soft tissue mobilization   Manual therapy comments pt hooklying with torso elevated    Soft tissue mobilization TPR to Rt levator, upper trap, suboccipitals.    Manual Traction gentle cervical traction 15 sec, x 5 reps                 PT Education - 08/23/16 1005    Education provided Yes   Education Details HEP,  rationale of traction/ estim.     Person(s) Educated Patient   Methods Handout;Explanation;Demonstration;Tactile cues   Comprehension Verbalized understanding;Returned demonstration             PT Long Term Goals - 08/23/16 1006      PT LONG TERM GOAL  #1   Title Pt to be independent with HEP for cervical ROM/strengthening. 10/01/16   Time 6   Period Weeks   Status On-going     PT LONG TERM GOAL #2   Title FOTO score to be =/< 48% 10/01/16   Time 6   Period Weeks   Status On-going     PT LONG TERM GOAL #3   Title Pt to demonstrate 20 degree improvement in cervical rotation bilaterally for environmental scanning. 10/01/16   Time 6   Period Weeks   Status On-going     PT LONG TERM GOAL #4   Title Pt to report 50% or better improvement in her pain for activity tolerance. 10/01/16   Time 6   Period Weeks   Status On-going     PT LONG TERM GOAL #5   Title Pt to demonstrate Rt shoulder strength to 4/5 for overhead reaching tasks. 10/01/16   Time 6   Period Weeks   Status On-going               Plan - 08/23/16 1007    Clinical Impression Statement Pt required some tactile/ VC for form and intensity of exercise.  She tolerated all without increase in pain.  She reported relief with short bouts of manual cervical traction - started trial of mechanical traction.     Rehab Potential Good   Clinical Impairments Affecting Rehab Potential recent CVA   PT Frequency 2x / week   PT Duration 6 weeks   PT Treatment/Interventions ADLs/Self Care Home Management;Cryotherapy;Electrical Stimulation;Iontophoresis 4mg /ml Dexamethasone;Moist Heat;Traction;Ultrasound;Functional mobility training;Patient/family education;Neuromuscular re-education;Therapeutic exercise;Therapeutic activities;Manual techniques;Taping;Dry needling   PT Next Visit Plan Assess response to traction, increase pull as indicated.  Progress spinal stabilization as tolerated.    Consulted and Agree with Plan of Care Patient      Patient will benefit from skilled therapeutic intervention in order to improve the following deficits and impairments:  Decreased activity tolerance, Decreased range of motion, Decreased strength, Postural dysfunction, Impaired flexibility,  Pain, Impaired UE functional use, Increased muscle spasms  Visit Diagnosis: Muscle weakness (generalized)  Stiffness of right shoulder, not elsewhere classified  Stiffness of neck  Acute pain     Problem List Patient Active Problem List   Diagnosis Date Noted  . Mitral valve prolapse syndrome 08/15/2016  . Nerve compression 08/15/2016  . Congestive heart failure ([place]) 08/15/2016  . Right cervical radiculopathy 08/09/2016  . Acute  ischemic right MCA stroke ([place]) 07/31/2016  . Essential hypertension, malignant 06/12/2016  . History of CVA (cerebrovascular accident) 03/13/2016  . 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 Narcolepsy 03/13/2013  . Coronary atherosclerosis 03/13/2013  . Syncope and collapse 08/08/2011  . DDD (degenerative disc disease), lumbar   . HIRSUTISM 06/02/2009  . BIPOLAR DISORDER UNSPECIFIED 05/17/2009  . DYSPNEA 05/17/2009  . SLEEP DISORDER 08/31/2008  . Pain in limb 08/03/2008  . Abnormal blood sugar 12/22/2007  . Hyperlipidemia 07/15/2007  . FATIGUE 07/15/2007   Kerin Perna, PTA 08/23/16 10:12 AM  [place], [place], [address] Phone: [phone]   Fax:  [phone]  Name: EFFY SUPERNAW MRN: [id number] Date of Birth: November 27, 1955

## 2016-08-23 NOTE — Patient Instructions (Signed)
  Head Press With [place] chin SLIGHTLY toward chest, keep mouth closed. Feel weight on back of head. Increase weight by pressing head down. Hold __3-5_ seconds. Relax. Repeat __10_ times. Surface: bed    Shoulder Press   Press both shoulders down. Hold __5_ seconds. Repeat _10__ times. . If unable to press one or both shoulders, lie in position a few sessions until you can. Surface: bed  [place]: Single Arm    Arms near sides, palms up. Press one arm lightly into bed, slide arm out to side and up alongside head. Keep contact with bed throughout motion. At maximal position, lengthen arm. Hold _3__ seconds. Relax. Repeat __10_ times. Slide arm back to start. Repeat with other arm.   [facility] [place], [place] [address]  [phone] (office) [phone] (fax)

## 2016-08-28 ENCOUNTER — Encounter: Payer: Medicare Other | Admitting: Physical Therapy

## 2016-08-28 ENCOUNTER — Ambulatory Visit (INDEPENDENT_AMBULATORY_CARE_PROVIDER_SITE_OTHER): Payer: Federal, State, Local not specified - PPO | Admitting: Cardiology

## 2016-08-28 ENCOUNTER — Encounter: Payer: Self-pay | Admitting: Cardiology

## 2016-08-28 VITALS — BP 144/92 | HR 77 | Ht 66.0 in | Wt 189.8 lb

## 2016-08-28 DIAGNOSIS — E7800 Pure hypercholesterolemia, unspecified: Secondary | ICD-10-CM

## 2016-08-28 DIAGNOSIS — I10 Essential (primary) hypertension: Secondary | ICD-10-CM

## 2016-08-28 DIAGNOSIS — I63511 Cerebral infarction due to unspecified occlusion or stenosis of right middle cerebral artery: Secondary | ICD-10-CM

## 2016-08-28 DIAGNOSIS — R0600 Dyspnea, unspecified: Secondary | ICD-10-CM

## 2016-08-28 MED ORDER — LABETALOL HCL 300 MG PO TABS: 300.0000 mg | ORAL_TABLET | Freq: Two times a day (BID) | ORAL | 12 refills | Status: DC

## 2016-08-28 NOTE — Patient Instructions (Signed)
Medication Instructions:   INCREASE LABETALOL TO 300 MG TWICE DAILY= 1 AND 1/2 OF THE 200 MG TABLET TWICE DAILY  Follow-Up:  Your physician wants you to follow-up in: [facility] will receive a reminder letter in the mail two months in advance. If you don't receive a letter, please call our office to schedule the follow-up appointment.   If you need a refill on your cardiac medications before your next appointment, please call your pharmacy.

## 2016-09-02 ENCOUNTER — Ambulatory Visit: Payer: Medicare Other | Admitting: Sports Medicine

## 2016-09-03 ENCOUNTER — Ambulatory Visit (INDEPENDENT_AMBULATORY_CARE_PROVIDER_SITE_OTHER): Payer: Federal, State, Local not specified - PPO | Admitting: Sports Medicine

## 2016-09-03 ENCOUNTER — Encounter: Payer: Self-pay | Admitting: Sports Medicine

## 2016-09-03 DIAGNOSIS — M5412 Radiculopathy, cervical region: Secondary | ICD-10-CM | POA: Diagnosis not present

## 2016-09-03 DIAGNOSIS — I63511 Cerebral infarction due to unspecified occlusion or stenosis of right middle cerebral artery: Secondary | ICD-10-CM

## 2016-09-03 MED ORDER — GABAPENTIN 300 MG PO CAPS: ORAL_CAPSULE | ORAL | 3 refills | Status: DC

## 2016-09-03 NOTE — Progress Notes (Signed)
  Subjective:    CC: Follow-up  HPI: Right cervical radiculopathy: 40% better after physical therapy, steroids, NSAIDs. She does have a single additional session. She is not yet ready to consider MRI or intervention.  Past medical history:  Negative.  See flowsheet/record as well for more information.  Surgical history: Negative.  See flowsheet/record as well for more information.  Family history: Negative.  See flowsheet/record as well for more information.  Social history: Negative.  See flowsheet/record as well for more information.  Allergies, and medications have been entered into the medical record, reviewed, and no changes needed.   Review of Systems: No fevers, chills, night sweats, weight loss, chest pain, or shortness of breath.   Objective:    General: Well Developed, well nourished, and in no acute distress.  Neuro: Alert and oriented x3, extra-ocular muscles intact, sensation grossly intact.  HEENT: Normocephalic, atraumatic, pupils equal round reactive to light, neck supple, no masses, no lymphadenopathy, thyroid nonpalpable.  Skin: Warm and dry, no rashes. Cardiac: Regular rate and rhythm, no murmurs rubs or gallops, no lower extremity edema.  Respiratory: Clear to auscultation bilaterally. Not using accessory muscles, speaking in full sentences.  Impression and Recommendations:    Right cervical radiculopathy 40% better after physical therapy, other oral medications. She has a single additional session of therapy which she will complete, we are going to increase her gabapentin to 300 mg in an up taper. Return to see me in one month, epidural if no better. I have asked her to obtain a copy of her MRI disc.  I spent 25 minutes with this patient, greater than 50% was face-to-face time counseling regarding the above diagnoses

## 2016-09-03 NOTE — Assessment & Plan Note (Signed)
40% better after physical therapy, other oral medications. She has a single additional session of therapy which she will complete, we are going to increase her gabapentin to 300 mg in an up taper. Return to see me in one month, epidural if no better. I have asked her to obtain a copy of her MRI disc.

## 2016-09-05 ENCOUNTER — Encounter: Payer: Self-pay | Admitting: Physical Therapy

## 2016-09-05 ENCOUNTER — Ambulatory Visit (INDEPENDENT_AMBULATORY_CARE_PROVIDER_SITE_OTHER): Payer: Federal, State, Local not specified - PPO | Admitting: Physical Therapy

## 2016-09-05 DIAGNOSIS — M25611 Stiffness of right shoulder, not elsewhere classified: Secondary | ICD-10-CM

## 2016-09-05 DIAGNOSIS — M436 Torticollis: Secondary | ICD-10-CM

## 2016-09-05 DIAGNOSIS — M6281 Muscle weakness (generalized): Secondary | ICD-10-CM | POA: Diagnosis not present

## 2016-09-05 NOTE — Therapy (Signed)
[place], [place], [address] Phone: [phone]   Fax:  [phone]  Physical Therapy Treatment  Patient Details  Name: [name] MRN: [id number] Date of Birth: 04-Jan-1956 Referring Provider: Dr. Helane Rima  Encounter Date: 09/05/2016      PT End of Session - 09/05/16 0806    Visit Number 3   Number of Visits 12   Date for PT Re-Evaluation 10/01/16   PT Start Time 0806   PT Stop Time 0857   PT Time Calculation (min) 51 min   Activity Tolerance Patient limited by pain      Past Medical History:  Diagnosis Date  . Bipolar disorder ([place])   . CHF (congestive heart failure) ([place])   . Chronic diastolic heart failure (HCC)    hospitalized at [facility] regional  . Complication of anesthesia    had "breathing problems and ended up in ICU" 03/2010 report on chart  . DDD (degenerative disc disease), lumbar   . 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 GERD (gastroesophageal reflux disease)   . Glucose intolerance (impaired glucose tolerance)   . HLD (hyperlipidemia)    nec/nos  . HTN (hypertension)    benign essential  . MVP (mitral valve prolapse)   . Narcolepsy   . Suicide attempt    hx  . Syncope    admx to [facility] in 2/12: Echo with normal LVF; head CT unremarkable, ECG stable, no further w/u  . Ulcer [facility])     Past Surgical History:  Procedure Laterality Date  . BLADDER SUSPENSION    . GASTRIC BYPASS    . LUMBAR LAMINECTOMY/DECOMPRESSION MICRODISCECTOMY  09/11/2011   Procedure: LUMBAR LAMINECTOMY/DECOMPRESSION MICRODISCECTOMY;  Surgeon: Tobi Bastos;  Location: [facility];  Service: Orthopedics;  Laterality: Right;  Hemi Laminectomy/Microdiscectomy L5 - S1 on the Right (X-Ray)  . PARTIAL HYSTERECTOMY    . REPAIR PERONEAL TENDONS ANKLE      There were no vitals filed for this visit.      Subjective Assessment - 09/05/16 0809    Subjective Pt saw the MD yesterday and  reports she is about 40% imporved. She thinks traction helped   Patient Stated Goals Less pain and avoid surgery   Currently in Pain? Yes   Pain Score 6    Pain Location Neck   Pain Orientation Right   Pain Descriptors / Indicators Aching   Pain Type Acute pain   Pain Radiating Towards into the Rt shoulder   Pain Onset More than a month ago   Pain Frequency Constant   Aggravating Factors  reaching or turning to the Rt   Pain Relieving Factors heat, rubbing with towel                          [facility] Adult PT Treatment/Exercise - 09/05/16 0001      Neck Exercises: Machines for Strengthening   UBE (Upper Arm Bike) L x 3' alt FWD/BWD     Neck Exercises: Standing   Other Standing Exercises shoulder shrugs and circles     Modalities   Modalities Moist Heat;Traction;Ultrasound     Moist Heat Therapy   Number Minutes Moist Heat 20 Minutes   Moist Heat Location Cervical  with traction     Ultrasound   Ultrasound Location Rt upper trap levator   Ultrasound Parameters combo US/stim 100% 1.69mHz, 1.5w/cm2   Ultrasound Goals Pain  tone  Traction   Type of Traction Cervical   Min (lbs) 9   Max (lbs) 15   Hold Time 60   Rest Time 20   Time 19     Manual Therapy   Manual Therapy Soft tissue mobilization   Soft tissue mobilization TPR and STW to Rt upper trap/levator and perioccisput          Trigger Point Dry Needling - 09/05/16 [id number]    Consent Given? Yes   Education Handout Provided Yes   Muscles Treated Upper Body Upper trapezius  Rt with stim   Upper Trapezius Response Palpable increased muscle length;Twitch reponse elicited              PT Education - 09/05/16 0821    Education provided Yes   Education Details TDN   Person(s) Educated Patient   Methods Explanation;Handout   Comprehension Verbalized understanding             PT Long Term Goals - 08/23/16 1006      PT LONG TERM GOAL #1   Title Pt to be independent with HEP for  cervical ROM/strengthening. 10/01/16   Time 6   Period Weeks   Status On-going     PT LONG TERM GOAL #2   Title FOTO score to be =/< 48% 10/01/16   Time 6   Period Weeks   Status On-going     PT LONG TERM GOAL #3   Title Pt to demonstrate 20 degree improvement in cervical rotation bilaterally for environmental scanning. 10/01/16   Time 6   Period Weeks   Status On-going     PT LONG TERM GOAL #4   Title Pt to report 50% or better improvement in her pain for activity tolerance. 10/01/16   Time 6   Period Weeks   Status On-going     PT LONG TERM GOAL #5   Title Pt to demonstrate Rt shoulder strength to 4/5 for overhead reaching tasks. 10/01/16   Time 6   Period Weeks   Status On-going               Plan - 09/05/16 1101    Clinical Impression Statement This is Ritas third visit. She has had some relief since starting therapy.  She has a significant trigger point in her Rt upper trap that is very tender.  She tolerated a little TDN, stopped due to discomfort.  Did better with combo treatement.  Traction pull increased today.    Rehab Potential Good   Clinical Impairments Affecting Rehab Potential recent CVA   PT Frequency 2x / week   PT Duration 6 weeks   PT Treatment/Interventions ADLs/Self Care Home Management;Cryotherapy;Electrical Stimulation;Iontophoresis 4mg /ml Dexamethasone;Moist Heat;Traction;Ultrasound;Functional mobility training;Patient/family education;Neuromuscular re-education;Therapeutic exercise;Therapeutic activities;Manual techniques;Taping;Dry needling   PT Next Visit Plan possible more TDN, spinal mobs.  Continue with traction.    Consulted and Agree with Plan of Care Patient      Patient will benefit from skilled therapeutic intervention in order to improve the following deficits and impairments:  Decreased activity tolerance, Decreased range of motion, Decreased strength, Postural dysfunction, Impaired flexibility, Pain, Impaired UE functional use,  Increased muscle spasms  Visit Diagnosis: Muscle weakness (generalized)  Stiffness of right shoulder, not elsewhere classified  Stiffness of neck     Problem List Patient Active Problem List   Diagnosis Date Noted  . Mitral valve prolapse syndrome 08/15/2016  . Nerve compression 08/15/2016  . Congestive heart failure ([place]) 08/15/2016  . Right cervical radiculopathy  08/09/2016  . Acute ischemic right MCA stroke ([place]) 07/31/2016  . Essential hypertension, malignant 06/12/2016  . History of CVA (cerebrovascular accident) 03/13/2016  . 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 Narcolepsy 03/13/2013  . Coronary atherosclerosis 03/13/2013  . Syncope and collapse 08/08/2011  . DDD (degenerative disc disease), lumbar   . HIRSUTISM 06/02/2009  . BIPOLAR DISORDER UNSPECIFIED 05/17/2009  . DYSPNEA 05/17/2009  . SLEEP DISORDER 08/31/2008  . Pain in limb 08/03/2008  . Abnormal blood sugar 12/22/2007  . Hyperlipidemia 07/15/2007  . FATIGUE 07/15/2007    Jeral Pinch PT  09/05/2016, 11:02 AM  [facility] [place], [place], [address] Phone: [phone]   Fax:  [phone]  Name: DASHELLE BARTKUS MRN: [id number] Date of Birth: Aug 23, 1956

## 2016-09-05 NOTE — Patient Instructions (Signed)

## 2016-09-10 ENCOUNTER — Encounter: Payer: Medicare Other | Admitting: Rehabilitative and Restorative Service Providers"

## 2016-09-13 ENCOUNTER — Ambulatory Visit (INDEPENDENT_AMBULATORY_CARE_PROVIDER_SITE_OTHER): Payer: Federal, State, Local not specified - PPO | Admitting: Physical Therapy

## 2016-09-13 DIAGNOSIS — M6281 Muscle weakness (generalized): Secondary | ICD-10-CM

## 2016-09-13 DIAGNOSIS — M436 Torticollis: Secondary | ICD-10-CM | POA: Diagnosis not present

## 2016-09-13 DIAGNOSIS — M25611 Stiffness of right shoulder, not elsewhere classified: Secondary | ICD-10-CM

## 2016-09-13 NOTE — Therapy (Signed)
[place], [place], [address] Phone: [phone]   Fax:  [phone]  Physical Therapy Treatment  Patient Details  Name: [name] MRN: [id number] Date of Birth: 1955/12/07 Referring Provider: Dr. Helane Rima   Encounter Date: 09/13/2016      PT End of Session - 09/13/16 0814    Visit Number 4   Number of Visits 12   Date for PT Re-Evaluation 10/01/16   PT Start Time 0808  pt arrived late   PT Stop Time 0901   PT Time Calculation (min) 53 min   Activity Tolerance Patient tolerated treatment well;No increased pain      Past Medical History:  Diagnosis Date  . Bipolar disorder ([place])   . CHF (congestive heart failure) ([place])   . Chronic diastolic heart failure (HCC)    hospitalized at [facility]  . Complication of anesthesia    had "breathing problems and ended up in ICU" 03/2010 report on chart  . DDD (degenerative disc disease), lumbar   . 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 GERD (gastroesophageal reflux disease)   . Glucose intolerance (impaired glucose tolerance)   . HLD (hyperlipidemia)    nec/nos  . HTN (hypertension)    benign essential  . MVP (mitral valve prolapse)   . Narcolepsy   . Suicide attempt    hx  . Syncope    admx to [facility] in 2/12: Echo with normal LVF; head CT unremarkable, ECG stable, no further w/u  . Ulcer [facility])     Past Surgical History:  Procedure Laterality Date  . BLADDER SUSPENSION    . GASTRIC BYPASS    . LUMBAR LAMINECTOMY/DECOMPRESSION MICRODISCECTOMY  09/11/2011   Procedure: LUMBAR LAMINECTOMY/DECOMPRESSION MICRODISCECTOMY;  Surgeon: Tobi Bastos;  Location: [facility];  Service: Orthopedics;  Laterality: Right;  Hemi Laminectomy/Microdiscectomy L5 - S1 on the Right (X-Ray)  . PARTIAL HYSTERECTOMY    . REPAIR PERONEAL TENDONS ANKLE      There were no vitals filed for this visit.      Subjective Assessment - 09/13/16 0814     Subjective Pt believes the last treatment helped get her kink out a little bit.     Pertinent History ischemic MCA stroke, CHF, HTN, degenerative disc disorder, narcolepsy, bipolar disorder.   Currently in Pain? Yes   Pain Score 4    Pain Location Neck   Pain Orientation Right   Pain Descriptors / Indicators Aching   Pain Radiating Towards into Rt shoulder blade    Aggravating Factors  reaching, turning to Rt.    Pain Relieving Factors heat.             [facility] PT Assessment - 09/13/16 0001      Assessment   Medical Diagnosis right cervical radiculopathy   Referring Provider Dr. Helane Rima    Onset Date/Surgical Date 07/24/16   Hand Dominance Left   Next MD Visit not scheduled yet.           [facility] Adult PT Treatment/Exercise - 09/13/16 0001      Neck Exercises: Seated   Shoulder Rolls Backwards;Forwards;10 reps     Neck Exercises: Supine   Other Supine Exercise scap squeeze x 5 sec x 10 reps, head press x 3 sec x 5 reps.   bilat horiz abdct x 10 with yellow band.  sash with yellow band x 5 reps each side (to tolerance)   Other Supine Exercise snow  angels (bilat UE) x 10 reps.  shoulder flexion over head (unilateral) x 5 each arm      Modalities   Modalities Moist Heat     Moist Heat Therapy   Moist Heat Location Cervical  with traction     Electrical Stimulation   Electrical Stimulation Location Rt upper trap/ levator   Electrical Stimulation Action combo [place]   Electrical Stimulation Parameters to pt tolerance    Electrical Stimulation Goals Pain;Tone     Ultrasound   Ultrasound Location Rt upper trap and levator    Ultrasound Parameters combo [place]- 100%, 1.0 mHz, 1.2 w/cm2   Ultrasound Goals Pain  tone     Traction   Type of Traction Cervical   Min (lbs) 9   Max (lbs) 15   Hold Time 60   Rest Time 20   Time 19            PT Long Term Goals - 08/23/16 1006      PT LONG TERM GOAL #1   Title Pt to be independent with HEP for cervical  ROM/strengthening. 10/01/16   Time 6   Period Weeks   Status On-going     PT LONG TERM GOAL #2   Title FOTO score to be =/< 48% 10/01/16   Time 6   Period Weeks   Status On-going     PT LONG TERM GOAL #3   Title Pt to demonstrate 20 degree improvement in cervical rotation bilaterally for environmental scanning. 10/01/16   Time 6   Period Weeks   Status On-going     PT LONG TERM GOAL #4   Title Pt to report 50% or better improvement in her pain for activity tolerance. 10/01/16   Time 6   Period Weeks   Status On-going     PT LONG TERM GOAL #5   Title Pt to demonstrate Rt shoulder strength to 4/5 for overhead reaching tasks. 10/01/16   Time 6   Period Weeks   Status On-going               Plan - 09/13/16 0847    Clinical Impression Statement Pt reporting decreased levels of pain and slight improvement in functional activity at home.  She tolerated new exercises without increase in pain.  Pt making progress towards established goals.    Rehab Potential Good   PT Frequency 2x / week   PT Duration 6 weeks   PT Treatment/Interventions ADLs/Self Care Home Management;Cryotherapy;Electrical Stimulation;Iontophoresis 4mg /ml Dexamethasone;Moist Heat;Traction;Ultrasound;Functional mobility training;Patient/family education;Neuromuscular re-education;Therapeutic exercise;Therapeutic activities;Manual techniques;Taping;Dry needling   PT Next Visit Plan possible more TDN, spinal mobs.  Continue with traction.  Assess response to new exercises and add to HEP if tolerated.    Consulted and Agree with Plan of Care Patient      Patient will benefit from skilled therapeutic intervention in order to improve the following deficits and impairments:  Decreased activity tolerance, Decreased range of motion, Decreased strength, Postural dysfunction, Impaired flexibility, Pain, Impaired UE functional use, Increased muscle spasms  Visit Diagnosis: Muscle weakness (generalized)  Stiffness of  right shoulder, not elsewhere classified  Stiffness of neck     Problem List Patient Active Problem List   Diagnosis Date Noted  . Mitral valve prolapse syndrome 08/15/2016  . Nerve compression 08/15/2016  . Congestive heart failure ([place]) 08/15/2016  . Right cervical radiculopathy 08/09/2016  . Acute ischemic right MCA stroke ([place]) 07/31/2016  . Essential hypertension, malignant 06/12/2016  . History of CVA (cerebrovascular  accident) 03/13/2016  . 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 Narcolepsy 03/13/2013  . Coronary atherosclerosis 03/13/2013  . Syncope and collapse 08/08/2011  . DDD (degenerative disc disease), lumbar   . HIRSUTISM 06/02/2009  . BIPOLAR DISORDER UNSPECIFIED 05/17/2009  . DYSPNEA 05/17/2009  . SLEEP DISORDER 08/31/2008  . Pain in limb 08/03/2008  . Abnormal blood sugar 12/22/2007  . Hyperlipidemia 07/15/2007  . FATIGUE 07/15/2007   Kerin Perna, PTA 09/13/16 8:50 AM  [facility] [place], [place], [address] Phone: [phone]   Fax:  [phone]  Name: TRENESE SIMINGTON MRN: [id number] Date of Birth: June 08, 1956

## 2016-09-17 ENCOUNTER — Ambulatory Visit (INDEPENDENT_AMBULATORY_CARE_PROVIDER_SITE_OTHER): Payer: Federal, State, Local not specified - PPO | Admitting: Physical Therapy

## 2016-09-17 DIAGNOSIS — M6281 Muscle weakness (generalized): Secondary | ICD-10-CM | POA: Diagnosis not present

## 2016-09-17 DIAGNOSIS — M25611 Stiffness of right shoulder, not elsewhere classified: Secondary | ICD-10-CM | POA: Diagnosis not present

## 2016-09-17 DIAGNOSIS — M436 Torticollis: Secondary | ICD-10-CM | POA: Diagnosis not present

## 2016-09-17 NOTE — Patient Instructions (Signed)
Over Head Pull: Narrow Grip     [place] [phone]   On back, knees bent, feet flat, band across thighs, elbows straight but relaxed. Pull hands apart (start). Keeping elbows straight, bring arms up and over head, hands toward floor. Keep pull steady on band. Hold momentarily. Return slowly, keeping pull steady, back to start. Repeat _10__ times. Band color _yellow___   Side Pull: Double Arm   On back, knees bent, feet flat. Arms perpendicular to body, shoulder level, elbows straight but relaxed. Pull arms out to sides, elbows straight. Resistance band comes across collarbones, hands toward floor. Hold momentarily. Slowly return to starting position. Repeat _10__ times. Band color __yellow ___   Sash   On back, knees bent, feet flat, left hand on left hip, right hand above left. Pull right arm DIAGONALLY (hip to shoulder) across chest. Bring right arm along head toward floor. Hold momentarily. Slowly return to starting position. Repeat __10_ times. Do with left arm. Band color _yellow_____   Shoulder Rotation: Double Arm   On back, knees bent, feet flat, elbows tucked at sides, bent 90, hands palms up. Pull hands apart and down toward floor, keeping elbows near sides. Hold momentarily. Slowly return to starting position. Repeat _10__ times. Band color __yellow ____   Prudencio Pair [facility]: Double Arm    Arms near sides, palms up. Press both arms lightly into bed, slide arms out to side and up alongside head. Keep contact with floor throughout motion. At maximal position, lengthen arms. Hold _1-2__ seconds. Relax. Slide arms back to start. Repeat _10__ times.  [facility] [address], [place] [address]  [phone] (office) [phone] (fax)

## 2016-09-17 NOTE — Therapy (Signed)
[place], [place], [address] Phone: [phone]   Fax:  [phone]  Physical Therapy Treatment  Patient Details  Name: [name] MRN: [id number] Date of Birth: 03/30/1956 Referring Provider: Dr. Helane Rima   Encounter Date: 09/17/2016      PT End of Session - 09/17/16 0855    Visit Number 5   Number of Visits 12   Date for PT Re-Evaluation 10/01/16   PT Start Time 0807  pt arrived late   PT Stop Time 0848   PT Time Calculation (min) 41 min   Activity Tolerance Patient tolerated treatment well;No increased pain   Behavior During Therapy WFL for tasks assessed/performed      Past Medical History:  Diagnosis Date  . Bipolar disorder ([place])   . CHF (congestive heart failure) ([place])   . Chronic diastolic heart failure (HCC)    hospitalized at [facility]  . Complication of anesthesia    had "breathing problems and ended up in ICU" 03/2010 report on chart  . DDD (degenerative disc disease), lumbar   . 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 GERD (gastroesophageal reflux disease)   . Glucose intolerance (impaired glucose tolerance)   . HLD (hyperlipidemia)    nec/nos  . HTN (hypertension)    benign essential  . MVP (mitral valve prolapse)   . Narcolepsy   . Suicide attempt    hx  . Syncope    admx to [facility] in 2/12: Echo with normal LVF; head CT unremarkable, ECG stable, no further w/u  . Ulcer [facility]     Past Surgical History:  Procedure Laterality Date  . BLADDER SUSPENSION    . GASTRIC BYPASS    . LUMBAR LAMINECTOMY/DECOMPRESSION MICRODISCECTOMY  09/11/2011   Procedure: LUMBAR LAMINECTOMY/DECOMPRESSION MICRODISCECTOMY;  Surgeon: Tobi Bastos;  [facility];  Service: Orthopedics;  Laterality: Right;  Hemi Laminectomy/Microdiscectomy L5 - S1 on the Right (X-Ray)  . PARTIAL HYSTERECTOMY    . REPAIR PERONEAL TENDONS ANKLE      There were no vitals filed  for this visit.      Subjective Assessment - 09/17/16 0809    Subjective "today I'm having a good day".    The neck pain isn't as intense as it has been.    Pertinent History ischemic MCA stroke, CHF, HTN, degenerative disc disorder, narcolepsy, bipolar disorder.   Currently in Pain? Yes   Pain Score 3    Pain Location Neck   Pain Orientation Right   Pain Descriptors / Indicators Burning   Aggravating Factors  turning head to Rt.    Pain Relieving Factors heat            [facility] PT Assessment - 09/17/16 0001      Assessment   Medical Diagnosis right cervical radiculopathy   Referring Provider Dr. Helane Rima    Onset Date/Surgical Date 07/24/16   Hand Dominance Left   Next MD Visit not scheduled yet.      AROM   Right Shoulder Flexion 133 Degrees  supine   Cervical Flexion 36   Cervical Extension 24   Cervical - Right Side Bend 34   Cervical - Left Side Bend 32   Cervical - Right Rotation 45           [facility] Adult PT Treatment/Exercise - 09/17/16 0001      Self-Care   Self-Care Other Self-Care Comments   Other Self-Care Comments  Pt  instructed on use/application of her home cervical traction unit (parameters/ operation).  Pt verbalized understanding.      Neck Exercises: Seated   Cervical Rotation Right;Left;5 reps   Lateral Flexion Right;Left;5 reps     Neck Exercises: Supine   Other Supine Exercise scap squeeze x 5 sec x 10 reps, bilat horiz abdct x 10 with yellow band.  sash with yellow band x 10 reps each side, bilat ER with yellow band x 10 reps (to tolerance)   Other Supine Exercise snow angels (bilat UE) x 10 reps.  shoulder flexion over head with yellow band x 10 reps      Ultrasound   Ultrasound Location Rt upper trap and levator   Ultrasound Parameters COMBO [place], 100%, 1.2 w/cm2 x 8 min    Ultrasound Goals Pain  tone                     PT Long Term Goals - 09/17/16 0907      PT LONG TERM GOAL #1   Title Pt to be independent with  HEP for cervical ROM/strengthening. 10/01/16   Time 6   Period Weeks   Status On-going     PT LONG TERM GOAL #2   Title FOTO score to be =/< 48% 10/01/16   Time 6   Period Weeks   Status On-going     PT LONG TERM GOAL #3   Title Pt to demonstrate 20 degree improvement in cervical rotation bilaterally for environmental scanning. 10/01/16   Time 6   Period Weeks   Status On-going  progressing     PT LONG TERM GOAL #4   Title Pt to report 50% or better improvement in her pain for activity tolerance. 10/01/16   Time 6   Period Weeks   Status On-going     PT LONG TERM GOAL #5   Title Pt to demonstrate Rt shoulder strength to 4/5 for overhead reaching tasks. 10/01/16   Time 6   Period Weeks               Plan - 09/17/16 0930    Clinical Impression Statement Pt no longer reporting radicular symptoms; pt declined traction.  She tolerated all new exercises well without any pain or discomfort. These were added to her HEP today.  Pt demonstrating improved Rt shoulder and neck ROM.  Pt progressing well towards established goals.    Rehab Potential Good   PT Duration 6 weeks   PT Treatment/Interventions ADLs/Self Care Home Management;Cryotherapy;Electrical Stimulation;Iontophoresis 4mg /ml Dexamethasone;Moist Heat;Traction;Ultrasound;Functional mobility training;Patient/family education;Neuromuscular re-education;Therapeutic exercise;Therapeutic activities;Manual techniques;Taping;Dry needling   PT Next Visit Plan Progress spinal stabilization as tolerated.  Modalities as indicated.    Consulted and Agree with Plan of Care Patient      Patient will benefit from skilled therapeutic intervention in order to improve the following deficits and impairments:  Decreased activity tolerance, Decreased range of motion, Decreased strength, Postural dysfunction, Impaired flexibility, Pain, Impaired UE functional use, Increased muscle spasms  Visit Diagnosis: Muscle weakness  (generalized)  Stiffness of right shoulder, not elsewhere classified  Stiffness of neck     Problem List Patient Active Problem List   Diagnosis Date Noted  . Mitral valve prolapse syndrome 08/15/2016  . Nerve compression 08/15/2016  . Congestive heart failure ([place]) 08/15/2016  . Right cervical radiculopathy 08/09/2016  . Acute ischemic right MCA stroke ([place]) 07/31/2016  . Essential hypertension, malignant 06/12/2016  . History of CVA (cerebrovascular accident) 03/13/2016  . Insomnia  due to mental disorder 03/13/2013  . Narcolepsy 03/13/2013  . Coronary atherosclerosis 03/13/2013  . Syncope and collapse 08/08/2011  . DDD (degenerative disc disease), lumbar   . HIRSUTISM 06/02/2009  . BIPOLAR DISORDER UNSPECIFIED 05/17/2009  . DYSPNEA 05/17/2009  . SLEEP DISORDER 08/31/2008  . Pain in limb 08/03/2008  . Abnormal blood sugar 12/22/2007  . Hyperlipidemia 07/15/2007  . FATIGUE 07/15/2007   Kerin Perna, PTA 09/17/16 9:34 AM  [facility] [place], [place], [address] Phone: [phone]   Fax:  [phone]  Name: [name] MRN: [id number] Date of Birth: 21-Jun-1956

## 2016-09-20 ENCOUNTER — Ambulatory Visit (INDEPENDENT_AMBULATORY_CARE_PROVIDER_SITE_OTHER): Payer: Federal, State, Local not specified - PPO | Admitting: Physical Therapy

## 2016-09-20 DIAGNOSIS — M6281 Muscle weakness (generalized): Secondary | ICD-10-CM | POA: Diagnosis not present

## 2016-09-20 DIAGNOSIS — M436 Torticollis: Secondary | ICD-10-CM | POA: Diagnosis not present

## 2016-09-20 DIAGNOSIS — M25611 Stiffness of right shoulder, not elsewhere classified: Secondary | ICD-10-CM

## 2016-09-20 NOTE — Therapy (Signed)
[place], [place], [address] Phone: [phone]   Fax:  [phone]  Physical Therapy Treatment  Patient Details  Name: [name] MRN: [id number] Date of Birth: 12-Nov-1955 Referring Provider: Dr. Dianah Field   Encounter Date: 09/20/2016      PT End of Session - 09/20/16 [id number]    Visit Number 6   Number of Visits 12   Date for PT Re-Evaluation 10/01/16   PT Start Time 0813  pt arrived late   PT Stop Time [id number]   PT Time Calculation (min) 34 min   Activity Tolerance Patient tolerated treatment well;No increased pain   Behavior During Therapy WFL for tasks assessed/performed      Past Medical History:  Diagnosis Date  . Bipolar disorder ([place])   . CHF (congestive heart failure) ([place])   . Chronic diastolic heart failure (HCC)    hospitalized at [facility]  . Complication of anesthesia    had "breathing problems and ended up in ICU" 03/2010 report on chart  . DDD (degenerative disc disease), lumbar   . 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 GERD (gastroesophageal reflux disease)   . Glucose intolerance (impaired glucose tolerance)   . HLD (hyperlipidemia)    nec/nos  . HTN (hypertension)    benign essential  . MVP (mitral valve prolapse)   . Narcolepsy   . Suicide attempt    hx  . Syncope    admx to [facility] in 2/12: Echo with normal LVF; head CT unremarkable, ECG stable, no further w/u  . Ulcer [facility])     Past Surgical History:  Procedure Laterality Date  . BLADDER SUSPENSION    . GASTRIC BYPASS    . LUMBAR LAMINECTOMY/DECOMPRESSION MICRODISCECTOMY  09/11/2011   Procedure: LUMBAR LAMINECTOMY/DECOMPRESSION MICRODISCECTOMY;  Surgeon: Tobi Bastos;  [facility];  Service: Orthopedics;  Laterality: Right;  Hemi Laminectomy/Microdiscectomy L5 - S1 on the Right (X-Ray)  . PARTIAL HYSTERECTOMY    . REPAIR PERONEAL TENDONS ANKLE      There were no vitals filed  for this visit.      Subjective Assessment - 09/20/16 0820    Subjective Pt reports she tried her home traction unit and she thinks that is helping.  She believes her sleep position is agrivating her neck.  "Y'all have worked that knot out in my neck"    Pertinent History ischemic MCA stroke, CHF, HTN, degenerative disc disorder, narcolepsy, bipolar disorder.   Currently in Pain? No/denies  "just stiffness"            [facility] PT Assessment - 09/20/16 0001      Assessment   Medical Diagnosis right cervical radiculopathy   Referring Provider Dr. Dianah Field    Onset Date/Surgical Date 07/24/16   Hand Dominance Left     AROM   Right Shoulder Flexion 155 Degrees  supine, during exercise           [facility] Adult PT Treatment/Exercise - 09/20/16 0001      Neck Exercises: Machines for Strengthening   UBE (Upper Arm Bike) L1: 45 sec each direction     Neck Exercises: Supine   Other Supine Exercise  bilat horiz abdct x 15 with red band.  sash with red band x 12 reps each side, bilat ER with yellow band x 12 reps (to tolerance)   Other Supine Exercise snow angels (bilat UE) x 10 reps.  shoulder flexion over head with  red band x 12 reps      Ultrasound   Ultrasound Location Rt upper trap and cervical paraspinals    Ultrasound Parameters COMBO [place], 100%, 1.2 w/cm2, 8 min    Ultrasound Goals Pain  tone     Neck Exercises: Stretches   Upper Trapezius Stretch 2 reps;20 seconds   Levator Stretch 2 reps;20 seconds   Other Neck Stretches Doorway stretch (midlevel): 10 sec, 8 x - VC for form.              PT Long Term Goals - 09/17/16 0907      PT LONG TERM GOAL #1   Title Pt to be independent with HEP for cervical ROM/strengthening. 10/01/16   Time 6   Period Weeks   Status On-going     PT LONG TERM GOAL #2   Title FOTO score to be =/< 48% 10/01/16   Time 6   Period Weeks   Status On-going     PT LONG TERM GOAL #3   Title Pt to demonstrate 20 degree improvement in  cervical rotation bilaterally for environmental scanning. 10/01/16   Time 6   Period Weeks   Status On-going  progressing     PT LONG TERM GOAL #4   Title Pt to report 50% or better improvement in her pain for activity tolerance. 10/01/16   Time 6   Period Weeks   Status On-going     PT LONG TERM GOAL #5   Title Pt to demonstrate Rt shoulder strength to 4/5 for overhead reaching tasks. 10/01/16   Time 6   Period Weeks               Plan - 09/20/16 0831    Clinical Impression Statement Pt tolerated increased resistance with supine scapular strengthening without any increase in discomfort. She has responded well to manual therapy and [place].  Progressing well towards goals.    Rehab Potential Good   PT Frequency 2x / week   PT Duration 6 weeks   PT Treatment/Interventions ADLs/Self Care Home Management;Cryotherapy;Electrical Stimulation;Iontophoresis 4mg /ml Dexamethasone;Moist Heat;Traction;Ultrasound;Functional mobility training;Patient/family education;Neuromuscular re-education;Therapeutic exercise;Therapeutic activities;Manual techniques;Taping;Dry needling   PT Next Visit Plan Progress spinal stabilization as tolerated.  Modalities as indicated.    Consulted and Agree with Plan of Care Patient      Patient will benefit from skilled therapeutic intervention in order to improve the following deficits and impairments:  Decreased activity tolerance, Decreased range of motion, Decreased strength, Postural dysfunction, Impaired flexibility, Pain, Impaired UE functional use, Increased muscle spasms  Visit Diagnosis: Muscle weakness (generalized)  Stiffness of right shoulder, not elsewhere classified  Stiffness of neck     Problem List Patient Active Problem List   Diagnosis Date Noted  . Mitral valve prolapse syndrome 08/15/2016  . Nerve compression 08/15/2016  . Congestive heart failure ([place]) 08/15/2016  . Right cervical radiculopathy 08/09/2016  . Acute ischemic  right MCA stroke ([place]) 07/31/2016  . Essential hypertension, malignant 06/12/2016  . History of CVA (cerebrovascular accident) 03/13/2016  . 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 Narcolepsy 03/13/2013  . Coronary atherosclerosis 03/13/2013  . Syncope and collapse 08/08/2011  . DDD (degenerative disc disease), lumbar   . HIRSUTISM 06/02/2009  . BIPOLAR DISORDER UNSPECIFIED 05/17/2009  . DYSPNEA 05/17/2009  . SLEEP DISORDER 08/31/2008  . Pain in limb 08/03/2008  . Abnormal blood sugar 12/22/2007  . Hyperlipidemia 07/15/2007  . FATIGUE 07/15/2007   Kerin Perna, PTA 09/20/16 9:49 AM  [facility] [place], [place], [address] Phone: [phone]   Fax:  [phone]  Name: [name] MRN: [id number] Date of Birth: 1956/01/27

## 2016-09-24 ENCOUNTER — Ambulatory Visit (INDEPENDENT_AMBULATORY_CARE_PROVIDER_SITE_OTHER): Payer: Federal, State, Local not specified - PPO | Admitting: Physical Therapy

## 2016-09-24 DIAGNOSIS — M25611 Stiffness of right shoulder, not elsewhere classified: Secondary | ICD-10-CM

## 2016-09-24 DIAGNOSIS — M436 Torticollis: Secondary | ICD-10-CM | POA: Diagnosis not present

## 2016-09-24 DIAGNOSIS — M6281 Muscle weakness (generalized): Secondary | ICD-10-CM | POA: Diagnosis not present

## 2016-09-24 NOTE — Therapy (Addendum)
[place], [place], [address] Phone: [phone]   Fax:  [phone]  Physical Therapy Treatment  Patient Details  Name: [name] MRN: [id number] Date of Birth: 02/14/1956 Referring Provider: Dr. Dianah Field  Encounter Date: 09/24/2016      PT End of Session - 09/24/16 0816    Visit Number 7   Number of Visits 12   Date for PT Re-Evaluation 10/01/16   PT Start Time 0811  pt arrived late   PT Stop Time 0858   PT Time Calculation (min) 47 min      Past Medical History:  Diagnosis Date  . Bipolar disorder ([place])   . CHF (congestive heart failure) ([place])   . Chronic diastolic heart failure (HCC)    hospitalized at [facility]  . Complication of anesthesia    had "breathing problems and ended up in ICU" 03/2010 report on chart  . DDD (degenerative disc disease), lumbar   . 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 GERD (gastroesophageal reflux disease)   . Glucose intolerance (impaired glucose tolerance)   . HLD (hyperlipidemia)    nec/nos  . HTN (hypertension)    benign essential  . MVP (mitral valve prolapse)   . Narcolepsy   . Suicide attempt    hx  . Syncope    admx to [facility] in 2/12: Echo with normal LVF; head CT unremarkable, ECG stable, no further w/u  . Ulcer [facility])     Past Surgical History:  Procedure Laterality Date  . BLADDER SUSPENSION    . GASTRIC BYPASS    . LUMBAR LAMINECTOMY/DECOMPRESSION MICRODISCECTOMY  09/11/2011   Procedure: LUMBAR LAMINECTOMY/DECOMPRESSION MICRODISCECTOMY;  Surgeon: Tobi Bastos;  Location: [facility];  Service: Orthopedics;  Laterality: Right;  Hemi Laminectomy/Microdiscectomy L5 - S1 on the Right (X-Ray)  . PARTIAL HYSTERECTOMY    . REPAIR PERONEAL TENDONS ANKLE      There were no vitals filed for this visit.      Subjective Assessment - 09/24/16 0816    Subjective "I have a smile on my face.  I'm doing pretty good".  Pt  reports she only occasionally has a twinge in neck.  she has been doing HEP, and home traction as needed.    Currently in Pain? No/denies            [facility] PT Assessment - 09/24/16 0001      Assessment   Medical Diagnosis right cervical radiculopathy   Referring Provider Dr. Dianah Field   Onset Date/Surgical Date 07/24/16   Hand Dominance Left   Next MD Visit PRN     Observation/Other Assessments   Focus on Therapeutic Outcomes (FOTO)  37% limited.      AROM   Cervical Flexion 41   Cervical Extension 37   Cervical - Right Side Bend 38   Cervical - Left Side Bend 40   Cervical - Right Rotation 52   Cervical - Left Rotation 48     Strength   Right Shoulder Flexion 4+/5   Right Elbow Flexion 5/5   Right Elbow Extension 5/5   Right Hand Grip (lbs) 52   Left Hand Grip (lbs) 65          [facility] Adult PT Treatment/Exercise - 09/24/16 0001      Exercises   Exercises Neck;Shoulder     Neck Exercises: Machines for Strengthening   UBE (Upper Arm Bike) L1: 2 min forward/ 1 min  backward     Shoulder Exercises: Supine   Horizontal ABduction Strengthening;Both;20 reps;Theraband   Theraband Level (Shoulder Horizontal ABduction) Level 2 (Red)   Flexion Both;10 reps;Theraband  overhead pull   Theraband Level (Shoulder Flexion) Level 2 (Red)   Other Supine Exercises Sash with red band x 10 reps each arm.    Other Supine Exercises Snow angels x 10 reps.  bilat shoulder ER with red band x 15 reps      Moist Heat Therapy   Number Minutes Moist Heat 15 Minutes   Moist Heat Location Cervical     Electrical Stimulation   Electrical Stimulation Location Rt upper trap/ cervical paraspinals    Electrical Stimulation Action IFC   Electrical Stimulation Parameters  to pt tolerance    Electrical Stimulation Goals Pain;Tone     Neck Exercises: Stretches   Upper Trapezius Stretch 2 reps;20 seconds   Other Neck Stretches Doorway stretch: low and mid level x 30 sec x 3 reps                      PT Long Term Goals - 09/24/16 3888      PT LONG TERM GOAL #1   Title Pt to be independent with HEP for cervical ROM/strengthening. 10/01/16   Time 6   Period Weeks   Status Achieved     PT LONG TERM GOAL #2   Title FOTO score to be =/< 48% 10/01/16   Time 6   Period Weeks   Status Achieved     PT LONG TERM GOAL #3   Title Pt to demonstrate 20 degree improvement in cervical rotation bilaterally for environmental scanning. 10/01/16   Time 6   Period Weeks     PT LONG TERM GOAL #4   Title Pt to report 50% or better improvement in her pain for activity tolerance. 10/01/16   Time 6   Period Weeks   Status Achieved     PT LONG TERM GOAL #5   Title Pt to demonstrate Rt shoulder strength to 4/5 for overhead reaching tasks. 10/01/16   Time 6   Period Weeks   Status Achieved               Plan - 09/24/16 0851    Clinical Impression Statement Pt tolerated all exercises with red band well, without any increase in symptoms.  She has met her goals and requests to d/c to HEP at this time.    Rehab Potential Good   Clinical Impairments Affecting Rehab Potential recent CVA   PT Frequency 2x / week   PT Duration 6 weeks   PT Treatment/Interventions ADLs/Self Care Home Management;Cryotherapy;Electrical Stimulation;Iontophoresis 76m/ml Dexamethasone;Moist Heat;Traction;Ultrasound;Functional mobility training;Patient/family education;Neuromuscular re-education;Therapeutic exercise;Therapeutic activities;Manual techniques;Taping;Dry needling   PT Next Visit Plan Spoke to supervising PT; will d/c at this time.     Consulted and Agree with Plan of Care Patient      Patient will benefit from skilled therapeutic intervention in order to improve the following deficits and impairments:  Decreased activity tolerance, Decreased range of motion, Decreased strength, Postural dysfunction, Impaired flexibility, Pain, Impaired UE functional use, Increased muscle  spasms  Visit Diagnosis: Muscle weakness (generalized)  Stiffness of right shoulder, not elsewhere classified  Stiffness of neck     Problem List Patient Active Problem List   Diagnosis Date Noted  . Mitral valve prolapse syndrome 08/15/2016  . Nerve compression 08/15/2016  . Congestive heart failure ([place] 08/15/2016  . Right cervical radiculopathy  08/09/2016  . Acute ischemic right MCA stroke ([place]) 07/31/2016  . Essential hypertension, malignant 06/12/2016  . History of CVA (cerebrovascular accident) 03/13/2016  . 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 Narcolepsy 03/13/2013  . Coronary atherosclerosis 03/13/2013  . Syncope and collapse 08/08/2011  . DDD (degenerative disc disease), lumbar   . HIRSUTISM 06/02/2009  . BIPOLAR DISORDER UNSPECIFIED 05/17/2009  . DYSPNEA 05/17/2009  . SLEEP DISORDER 08/31/2008  . Pain in limb 08/03/2008  . Abnormal blood sugar 12/22/2007  . Hyperlipidemia 07/15/2007  . FATIGUE 07/15/2007   Kerin Perna, PTA 09/24/16 9:04 AM   [place], [place], [address] Phone: [phone]   Fax:  [phone]  Name: [name] MRN: [id number] Date of Birth: 11/05/1955  PHYSICAL THERAPY DISCHARGE SUMMARY  Visits from Start of Care: 7  Current functional level related to goals / functional outcomes: See above   Remaining deficits: none   Education / Equipment: HEP Plan: Patient agrees to discharge.  Patient goals were met. Patient is being discharged due to meeting the stated rehab goals.  ?????    Jeral Pinch, PT 10/02/16 9:22 AM

## 2016-09-27 ENCOUNTER — Encounter: Payer: Medicare Other | Admitting: Physical Therapy

## 2016-10-01 ENCOUNTER — Encounter: Payer: Medicare Other | Admitting: Physical Therapy

## 2016-10-04 ENCOUNTER — Encounter: Payer: Medicare Other | Admitting: Physical Therapy

## 2016-11-07 ENCOUNTER — Other Ambulatory Visit: Payer: Self-pay

## 2016-11-07 MED ORDER — CLONIDINE HCL 0.2 MG PO TABS: ORAL_TABLET | ORAL | 0 refills | Status: DC

## 2016-11-21 ENCOUNTER — Other Ambulatory Visit: Payer: Self-pay | Admitting: Cardiology

## 2016-11-21 ENCOUNTER — Other Ambulatory Visit: Payer: Self-pay | Admitting: Family Medicine

## 2016-11-22 ENCOUNTER — Other Ambulatory Visit: Payer: Self-pay | Admitting: *Deleted

## 2016-11-22 MED ORDER — CLONIDINE HCL 0.2 MG PO TABS: ORAL_TABLET | ORAL | 5 refills | Status: DC

## 2016-11-22 NOTE — Telephone Encounter (Signed)
Divydose left a msg on the refill vm requesting a call at  [phone] in regards to a refusal on the clonidine. Thanks, MI

## 2016-11-22 NOTE — Telephone Encounter (Signed)
[place]. Patient needs refills on file for clonidine. Rx(s) sent to pharmacy electronically.

## 2016-11-26 DIAGNOSIS — G43719 Chronic migraine without aura, intractable, without status migrainosus: Secondary | ICD-10-CM | POA: Diagnosis not present

## 2016-11-26 DIAGNOSIS — R51 Headache: Secondary | ICD-10-CM | POA: Diagnosis not present

## 2016-11-26 DIAGNOSIS — I6322 Cerebral infarction due to unspecified occlusion or stenosis of basilar arteries: Secondary | ICD-10-CM | POA: Diagnosis not present

## 2016-12-18 ENCOUNTER — Telehealth: Payer: Self-pay | Admitting: Family Medicine

## 2016-12-18 NOTE — Telephone Encounter (Signed)
I called pt and left a message stating pt needed to call for a f/u appt for her CHF with Dr. Madilyn Fireman

## 2016-12-23 ENCOUNTER — Other Ambulatory Visit: Payer: Self-pay | Admitting: Family Medicine

## 2017-02-19 ENCOUNTER — Other Ambulatory Visit: Payer: Self-pay | Admitting: Sports Medicine

## 2017-02-19 DIAGNOSIS — M5412 Radiculopathy, cervical region: Secondary | ICD-10-CM

## 2017-03-13 ENCOUNTER — Other Ambulatory Visit: Payer: Self-pay | Admitting: Family Medicine

## 2017-03-21 ENCOUNTER — Other Ambulatory Visit: Payer: Self-pay | Admitting: Cardiology

## 2017-04-14 ENCOUNTER — Ambulatory Visit (INDEPENDENT_AMBULATORY_CARE_PROVIDER_SITE_OTHER): Payer: Federal, State, Local not specified - PPO | Admitting: Family Medicine

## 2017-04-14 VITALS — BP 130/77 | HR 72 | Ht 66.0 in | Wt 195.0 lb

## 2017-04-14 DIAGNOSIS — I10 Essential (primary) hypertension: Secondary | ICD-10-CM

## 2017-04-14 DIAGNOSIS — R310 Gross hematuria: Secondary | ICD-10-CM | POA: Diagnosis not present

## 2017-04-14 DIAGNOSIS — E7800 Pure hypercholesterolemia, unspecified: Secondary | ICD-10-CM | POA: Diagnosis not present

## 2017-04-14 DIAGNOSIS — M545 Low back pain, unspecified: Secondary | ICD-10-CM

## 2017-04-14 DIAGNOSIS — G8929 Other chronic pain: Secondary | ICD-10-CM | POA: Diagnosis not present

## 2017-04-14 DIAGNOSIS — F5089 Other specified eating disorder: Secondary | ICD-10-CM

## 2017-04-14 DIAGNOSIS — R7301 Impaired fasting glucose: Secondary | ICD-10-CM

## 2017-04-14 LAB — CBC WITH DIFFERENTIAL/PLATELET
BASOS PCT: 1 %
Basophils Absolute: 71 cells/uL (ref 0–200)
Eosinophils Absolute: 426 cells/uL (ref 15–500)
Eosinophils Relative: 6 %
HCT: 39.2 % (ref 35.0–45.0)
Hemoglobin: 12.4 g/dL (ref 11.7–15.5)
LYMPHS PCT: 30 %
Lymphs Abs: 2130 cells/uL (ref 850–3900)
MCH: 26 pg — ABNORMAL LOW (ref 27.0–33.0)
MCHC: 31.6 g/dL — ABNORMAL LOW (ref 32.0–36.0)
MCV: 82.2 fL (ref 80.0–100.0)
MONOS PCT: 8 %
MPV: 10.7 fL (ref 7.5–12.5)
Monocytes Absolute: 568 cells/uL (ref 200–950)
Neutro Abs: 3905 cells/uL (ref 1500–7800)
Neutrophils Relative %: 55 %
PLATELETS: 228 10*3/uL (ref 140–400)
RBC: 4.77 MIL/uL (ref 3.80–5.10)
RDW: 16.5 % — AB (ref 11.0–15.0)
WBC: 7.1 10*3/uL (ref 3.8–10.8)

## 2017-04-14 LAB — COMPLETE METABOLIC PANEL WITH GFR
ALT: 11 U/L (ref 6–29)
AST: 18 U/L (ref 10–35)
Albumin: 4.1 g/dL (ref 3.6–5.1)
Alkaline Phosphatase: 99 U/L (ref 33–130)
BILIRUBIN TOTAL: 0.7 mg/dL (ref 0.2–1.2)
BUN: 12 mg/dL (ref 7–25)
CALCIUM: 9.1 mg/dL (ref 8.6–10.4)
CO2: 20 mmol/L (ref 20–31)
CREATININE: 0.82 mg/dL (ref 0.50–1.05)
Chloride: 107 mmol/L (ref 98–110)
GFR, EST NON AFRICAN AMERICAN: 79 mL/min (ref >=60)
Glucose, Bld: 86 mg/dL (ref 65–99)
Potassium: 4.1 mmol/L (ref 3.5–5.3)
Sodium: 141 mmol/L (ref 135–146)
TOTAL PROTEIN: 6.7 g/dL (ref 6.1–8.1)

## 2017-04-14 LAB — IRON AND TIBC
%SAT: 22 % (ref 11–50)
IRON: 94 ug/dL (ref 45–160)
TIBC: 434 ug/dL (ref 250–450)
UIBC: 340 ug/dL

## 2017-04-14 LAB — TSH: TSH: 0.75 m[IU]/L

## 2017-04-14 LAB — LIPID PANEL
CHOL/HDL RATIO: 3.5 ratio (ref <5.0)
Cholesterol: 182 mg/dL (ref <200)
HDL: 52 mg/dL (ref >50)
LDL CALC: 111 mg/dL — AB (ref <100)
Triglycerides: 96 mg/dL (ref <150)
VLDL: 19 mg/dL (ref <30)

## 2017-04-14 MED ORDER — DULOXETINE HCL 60 MG PO CPEP: 60.0000 mg | ORAL_CAPSULE | Freq: Every evening | ORAL | 1 refills | Status: DC

## 2017-04-14 MED ORDER — CLONAZEPAM 1 MG PO TABS: 1.0000 mg | ORAL_TABLET | Freq: Every day | ORAL | 0 refills | Status: DC | PRN

## 2017-04-14 NOTE — Progress Notes (Signed)
Subjective:    CC: HTN   HPI: Hypertension- Pt denies chest pain, SOB, dizziness, or heart palpitations.  Taking meds as directed w/o problems.  Denies medication side effects.     She also has chronic back and hip pain. She's had a history of low back surgery. She is currently on gabapentin and uses a topical cream. She is not interested in having repeat surgery but is at the point where she feels like she needs more pain control. She is requesting a pain management referral.  2015 January MRI Lumbar Spine Wo W Contrast1/30/2015 Saline Result Narrative  [phone] Attending CZ:[id number] RAMOS [phone] Ordering AT:[id number] Date of Birth:01/09/1957Sex: F Admit Date:11/05/2013 09:43  ###FINAL RESULT###    INDICATIONS: POSTLAMINECTOMY SYNDROME, LBP, RT HIP PAIN, RT LEG PAIN,  NUMBNESS, AND TINGLING COMMENTS:   PROCEDURE:QMR 1131- MRI L-SPINE WO W/CONT - Nov 05 2013  [employer] Accession #: [id number] [employer] Accession #: [id number]     EXAMINATION: MRI lumbar spine without and with contrast  CLINICAL INDICATION: Postlaminectomy syndrome. Right leg radiculopathy.  TECHNIQUE: MRI lumbar spine protocol without contrast. Postcontrast  images with 14 cc MultiHance.  COMPARISON: None  FINDINGS:  Bone marrow signal: There is some sclerosis in the vertebral endplates at  O1-Y0 and some edema due to degenerative disc disease. Bone marrow signal  is otherwise normal.  Conus medullaris: Normal  There is no pathologic contrast enhancement.  L1-L2: Normal  L2-L3: Normal  L3-L4: There is mild degenerative disc disease with a mild disc bulge.  Otherwise normal.  L4-L5: There is mild facet and ligamentous enlargement. Mild disc bulge.  No spinal stenosis or nerve root compromise.  L5-S1: There has been surgery with a small right-sided laminectomy. There  is degenerative disc disease with a broad-based disc bulge. No spinal  stenosis or nerve root  compromise seen.    IMPRESSION:   Postoperative changes from a small right-sided laminectomy at L5-S1.  There are otherwise mild degenerative changes. There is no disc  herniation, spinal stenosis or specific cause for a radiculopathy seen.      She is craving ice and starch recently.  She says she noticed it for about the last 3 months.  Past medical history, Surgical history, Family history not pertinant except as noted below, Social history, Allergies, and medications have been entered into the medical record, reviewed, and corrections made.   Review of Systems: No fevers, chills, night sweats, weight loss, chest pain, or shortness of breath.   Objective:    General: Well Developed, well nourished, and in no acute distress.  Neuro: Alert and oriented x3, extra-ocular muscles intact, sensation grossly intact.  HEENT: Normocephalic, atraumatic  Skin: Warm and dry, no rashes. Cardiac: Regular rate and rhythm, no murmurs rubs or gallops, no lower extremity edema.  Respiratory: Clear to auscultation bilaterally. Not using accessory muscles, speaking in full sentences.   Impression and Recommendations:    HTN - Well controlled. Continue current regimen. Follow up in  6 months.   CHF - STable. No sign of swelling on exam.   Pica - will check for iron deficiency. She denies any blood in the stool.    Chronic back and hip pain-we'll refer to pain management for further evaluation. She doesn't have any recent imaging.  Offered to refer to PT if she would like. She said she would think about it.    IFG- Stable. Recheck in 6 months. Continue to work on diet and exercise.   Lab Results  Component  Value Date   HGBA1C 5.8 12/21/2014    Gross hematuria - one episode.  Check UA.

## 2017-04-15 ENCOUNTER — Other Ambulatory Visit: Payer: Self-pay

## 2017-04-15 DIAGNOSIS — D649 Anemia, unspecified: Secondary | ICD-10-CM

## 2017-04-15 LAB — URINALYSIS, ROUTINE W REFLEX MICROSCOPIC
BILIRUBIN URINE: NEGATIVE
GLUCOSE, UA: NEGATIVE
HGB URINE DIPSTICK: NEGATIVE
Ketones, ur: NEGATIVE
LEUKOCYTES UA: NEGATIVE
Nitrite: NEGATIVE
PH: 5.5 (ref 5.0–8.0)
PROTEIN: NEGATIVE
Specific Gravity, Urine: 1.016 (ref 1.001–1.035)

## 2017-04-15 LAB — HEMOGLOBIN A1C
Hgb A1c MFr Bld: 5.6 % (ref <5.7)
MEAN PLASMA GLUCOSE: 114 mg/dL

## 2017-04-15 LAB — FERRITIN: Ferritin: 10 ng/mL (ref 10–232)

## 2017-04-21 ENCOUNTER — Other Ambulatory Visit: Payer: Self-pay | Admitting: Cardiology

## 2017-04-21 ENCOUNTER — Telehealth: Payer: Self-pay

## 2017-04-21 NOTE — Telephone Encounter (Signed)
Patient request refill for Clonazepam 1 mg. Rx was filled on 04/11/17 and faxed to [facility]. So refill request was denied. Topher Buenaventura,CMA

## 2017-04-21 NOTE — Telephone Encounter (Signed)
Rx has been sent to the pharmacy electronically. ° °

## 2017-05-08 ENCOUNTER — Other Ambulatory Visit: Payer: Self-pay | Admitting: Family Medicine

## 2017-05-22 ENCOUNTER — Telehealth: Payer: Self-pay | Admitting: Family Medicine

## 2017-05-22 ENCOUNTER — Other Ambulatory Visit: Payer: Self-pay | Admitting: *Deleted

## 2017-05-22 NOTE — Telephone Encounter (Signed)
Call pt: received request for refill from pharmacy for the clonazepam. It is too early to fill. Remind her she is to use this sparingly.  If 30 tabs is not lasting several months then we may need to refer her to therapy for counseling. I know she is already on cymbala.  Plus whe/if she is started on pain regimen she won't be able to take this kind of medicaiton with it.

## 2017-05-23 NOTE — Telephone Encounter (Signed)
Called pt and lm for her to call back. thanks

## 2017-05-23 NOTE — Telephone Encounter (Signed)
Left VM for Pt to return clinic call regarding refill request, callback information provided.

## 2017-05-26 NOTE — Telephone Encounter (Signed)
Patient states she does not need refill. She only takes as needed.

## 2017-06-16 ENCOUNTER — Encounter: Payer: Self-pay | Admitting: Family Medicine

## 2017-06-16 ENCOUNTER — Ambulatory Visit (INDEPENDENT_AMBULATORY_CARE_PROVIDER_SITE_OTHER): Payer: Federal, State, Local not specified - PPO

## 2017-06-16 ENCOUNTER — Ambulatory Visit (INDEPENDENT_AMBULATORY_CARE_PROVIDER_SITE_OTHER): Payer: Federal, State, Local not specified - PPO | Admitting: Family Medicine

## 2017-06-16 VITALS — BP 163/98 | HR 79 | Ht 66.0 in | Wt 194.1 lb

## 2017-06-16 DIAGNOSIS — M545 Low back pain: Secondary | ICD-10-CM | POA: Diagnosis not present

## 2017-06-16 DIAGNOSIS — M5117 Intervertebral disc disorders with radiculopathy, lumbosacral region: Secondary | ICD-10-CM | POA: Diagnosis not present

## 2017-06-16 DIAGNOSIS — M5416 Radiculopathy, lumbar region: Secondary | ICD-10-CM

## 2017-06-16 DIAGNOSIS — I700 Atherosclerosis of aorta: Secondary | ICD-10-CM

## 2017-06-16 MED ORDER — GABAPENTIN 300 MG PO CAPS: 900.0000 mg | ORAL_CAPSULE | Freq: Three times a day (TID) | ORAL | 11 refills | Status: DC

## 2017-06-16 NOTE — Patient Instructions (Signed)
Thank you for coming in today. Get xray today and start PT.  Likely we will be doing a MRI Monday.  Take gabapentin 3 pills 3x daily.   Recheck after MRI.    Sciatica Sciatica is pain, numbness, weakness, or tingling along the path of the sciatic nerve. The sciatic nerve starts in the lower back and runs down the back of each leg. The nerve controls the muscles in the lower leg and in the back of the knee. It also provides feeling (sensation) to the back of the thigh, the lower leg, and the sole of the foot. Sciatica is a symptom of another medical condition that pinches or puts pressure on the sciatic nerve. Generally, sciatica only affects one side of the body. Sciatica usually goes away on its own or with treatment. In some cases, sciatica may keep coming back (recur). What are the causes? This condition is caused by pressure on the sciatic nerve, or pinching of the sciatic nerve. This may be the result of:  A disk in between the bones of the spine (vertebrae) bulging out too far (herniated disk).  Age-related changes in the spinal disks (degenerative disk disease).  A pain disorder that affects a muscle in the buttock (piriformis syndrome).  Extra bone growth (bone spur) near the sciatic nerve.  An injury or break (fracture) of the pelvis.  Pregnancy.  Tumor (rare).  What increases the risk? The following factors may make you more likely to develop this condition:  Playing sports that place pressure or stress on the spine, such as football or weight lifting.  Having poor strength and flexibility.  A history of back injury.  A history of back surgery.  Sitting for long periods of time.  Doing activities that involve repetitive bending or lifting.  Obesity.  What are the signs or symptoms? Symptoms can vary from mild to very severe, and they may include:  Any of these problems in the lower back, leg, hip, or buttock: ? Mild tingling or dull aches. ? Burning  sensations. ? Sharp pains.  Numbness in the back of the calf or the sole of the foot.  Leg weakness.  Severe back pain that makes movement difficult.  These symptoms may get worse when you cough, sneeze, or laugh, or when you sit or stand for long periods of time. Being overweight may also make symptoms worse. In some cases, symptoms may recur over time. How is this diagnosed? This condition may be diagnosed based on:  Your symptoms.  A physical exam. Your health care provider may ask you to do certain movements to check whether those movements trigger your symptoms.  You may have tests, including: ? Blood tests. ? X-rays. ? MRI. ? CT scan.  How is this treated? In many cases, this condition improves on its own, without any treatment. However, treatment may include:  Reducing or modifying physical activity during periods of pain.  Exercising and stretching to strengthen your abdomen and improve the flexibility of your spine.  Icing and applying heat to the affected area.  Medicines that help: ? To relieve pain and swelling. ? To relax your muscles.  Injections of medicines that help to relieve pain, irritation, and inflammation around the sciatic nerve (steroids).  Surgery.  Follow these instructions at home: Medicines  Take over-the-counter and prescription medicines only as told by your health care provider.  Do not drive or operate heavy machinery while taking prescription pain medicine. Managing pain  If directed, apply ice to  the affected area. ? Put ice in a plastic bag. ? Place a towel between your skin and the bag. ? Leave the ice on for 20 minutes, 2-3 times a day.  After icing, apply heat to the affected area before you exercise or as often as told by your health care provider. Use the heat source that your health care provider recommends, such as a moist heat pack or a heating pad. ? Place a towel between your skin and the heat source. ? Leave the  heat on for 20-30 minutes. ? Remove the heat if your skin turns bright red. This is especially important if you are unable to feel pain, heat, or cold. You may have a greater risk of getting burned. Activity  Return to your normal activities as told by your health care provider. Ask your health care provider what activities are safe for you. ? Avoid activities that make your symptoms worse.  Take brief periods of rest throughout the day. Resting in a lying or standing position is usually better than sitting to rest. ? When you rest for longer periods, mix in some mild activity or stretching between periods of rest. This will help to prevent stiffness and pain. ? Avoid sitting for long periods of time without moving. Get up and move around at least one time each hour.  Exercise and stretch regularly, as told by your health care provider.  Do not lift anything that is heavier than 10 lb (4.5 kg) while you have symptoms of sciatica. When you do not have symptoms, you should still avoid heavy lifting, especially repetitive heavy lifting.  When you lift objects, always use proper lifting technique, which includes: ? Bending your knees. ? Keeping the load close to your body. ? Avoiding twisting. General instructions  Use good posture. ? Avoid leaning forward while sitting. ? Avoid hunching over while standing.  Maintain a healthy weight. Excess weight puts extra stress on your back and makes it difficult to maintain good posture.  Wear supportive, comfortable shoes. Avoid wearing high heels.  Avoid sleeping on a mattress that is too soft or too hard. A mattress that is firm enough to support your back when you sleep may help to reduce your pain.  Keep all follow-up visits as told by your health care provider. This is important. Contact a health care provider if:  You have pain that wakes you up when you are sleeping.  You have pain that gets worse when you lie down.  Your pain is  worse than you have experienced in the past.  Your pain lasts longer than 4 weeks.  You experience unexplained weight loss. Get help right away if:  You lose control of your bowel or bladder (incontinence).  You have: ? Weakness in your lower back, pelvis, buttocks, or legs that gets worse. ? Redness or swelling of your back. ? A burning sensation when you urinate. This information is not intended to replace advice given to you by your health care provider. Make sure you discuss any questions you have with your health care provider. Document Released: 09/17/2001 Document Revised: 02/27/2016 Document Reviewed: 06/02/2015 Elsevier Interactive Patient Education  2017 [employer].

## 2017-06-16 NOTE — Progress Notes (Signed)
[name] is a 61 y.o. female who presents to [place] today for back pain. Patient notes worsening chronic back pain. She has pain is not radiating to the bilateral lower extremities lateral calves and lateral feet. She has pain is worse with activity and better with rest. She denies any bowel or bladder dysfunction fevers or chills. She's tried some over-the-counter medications which have helped some.   Past Medical History:  Diagnosis Date  . Bipolar disorder ([place])   . CHF (congestive heart failure) ([place])   . Chronic diastolic heart failure (HCC)    hospitalized at [facility] regional  . Complication of anesthesia    had "breathing problems and ended up in ICU" 03/2010 report on chart  . DDD (degenerative disc disease), lumbar   . 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 GERD (gastroesophageal reflux disease)   . Glucose intolerance (impaired glucose tolerance)   . HLD (hyperlipidemia)    nec/nos  . HTN (hypertension)    benign essential  . MVP (mitral valve prolapse)   . Narcolepsy   . Suicide attempt ([place])    hx  . Syncope    admx to [facility] in 2/12: Echo with normal LVF; head CT unremarkable, ECG stable, no further w/u  . Ulcer    Past Surgical History:  Procedure Laterality Date  . BLADDER SUSPENSION    . GASTRIC BYPASS    . LUMBAR LAMINECTOMY/DECOMPRESSION MICRODISCECTOMY  09/11/2011   Procedure: LUMBAR LAMINECTOMY/DECOMPRESSION MICRODISCECTOMY;  Surgeon: Tobi Bastos;  Location: [facility];  Service: Orthopedics;  Laterality: Right;  Hemi Laminectomy/Microdiscectomy L5 - S1 on the Right (X-Ray)  . PARTIAL HYSTERECTOMY    . REPAIR PERONEAL TENDONS ANKLE     Social History  Substance Use Topics  . Smoking status: Former Smoker    Quit date: 10/08/2003  . Smokeless tobacco: Never Used  . Alcohol use Yes     Comment: rare     ROS:  As above   Medications: Current Outpatient Prescriptions  Medication Sig  Dispense Refill  . amLODipine-olmesartan (AZOR) 10-40 MG tablet TAKE ONE TABLET BY MOUTH ONCE DAILY 90 tablet 2  . atorvastatin (LIPITOR) 80 MG tablet Take 80 mg by mouth at bedtime.    . clonazePAM (KLONOPIN) 1 MG tablet Take 1 tablet (1 mg total) by mouth daily as needed for anxiety. 30 tablet 0  . cloNIDine (CATAPRES) 0.2 MG tablet TAKE TWO TABLETS BY MOUTH 3 TIMES A DAY 180 tablet 11  . Diclofenac Sodium 3 % GEL Place onto the skin.    . DULoxetine (CYMBALTA) 60 MG capsule Take 1 capsule (60 mg total) by mouth every evening. 90 capsule 1  . EQ ASPIRIN 325 MG tablet Take 325 mg by mouth daily.    [name] esomeprazole (NEXIUM) 40 MG capsule Take 40 mg by mouth 2 (two) times daily as needed. 1 capsule daily scheduled, will take an additional capsule if needed    . folic acid (FOLVITE) 1 MG tablet Take 1 mg by mouth daily.     . furosemide (LASIX) 20 MG tablet Take 1 tablet (20 mg total) by mouth daily. 30 tablet 5  . gabapentin (NEURONTIN) 300 MG capsule Take 3 capsules (900 mg total) by mouth 3 (three) times daily. 270 capsule 11  . hydrALAZINE (APRESOLINE) 100 MG tablet Take 1 tablet (100 mg total) by mouth 3 (three) times daily. 90 tablet 5  . ipratropium (ATROVENT) 0.03 % nasal  spray Place 2 sprays into both nostrils 2 (two) times daily as needed (nasal congestion, ear discomfort). 30 mL 0  . labetalol (NORMODYNE) 300 MG tablet Take 1 tablet (300 mg total) by mouth 2 (two) times daily. 60 tablet 12  . lidocaine (XYLOCAINE) 5 % ointment Apply 1 application topically as needed.    . meloxicam (MOBIC) 15 MG tablet One tab PO qAM with breakfast for 2 weeks, then daily prn pain. 30 tablet 3  . Multiple Vitamins-Minerals (MULTIVITAMINS THER. W/MINERALS) TABS Take 1 tablet by mouth daily.      [name] omeprazole (PRILOSEC) 20 MG capsule Take 2 capsules (40 mg total) by mouth daily. 30 capsule 0  . spironolactone (ALDACTONE) 50 MG tablet TAKE ONE TABLET BY MOUTH EVERY DAY 30 tablet 11  . VENTOLIN HFA 108 (90  BASE) MCG/ACT inhaler prn     No current facility-administered medications for this visit.    Allergies  Allergen Reactions  . Codeine Nausea And Vomiting     Exam:  BP (!) 163/98   Pulse 79   Ht 5\' 6"  (1.676 m)   Wt 194 lb 1.9 oz (88.1 kg)   SpO2 99%   BMI 31.33 kg/m  General: Well Developed, well nourished, and in no acute distress.  Neuro/Psych: Alert and oriented x3, extra-ocular muscles intact, able to move all 4 extremities, sensation grossly intact. Skin: Warm and dry, no rashes noted.  Respiratory: Not using accessory muscles, speaking in full sentences, trachea midline.  Cardiovascular: Pulses palpable, no extremity edema. Abdomen: Does not appear distended. MSK:  L spine: Nontender to midline. Tender palpation bilateral lumbar paraspinals. Motion normal flexion significantly extension rotation and lateral flexion Lower choice strength is intact. Pulses capillary refill and sensation are intact distally. Positive slump test bilaterally. Antalgic Gait present.  X-ray L-spine pending  No results found for this or any previous visit (from the past 48 hour(s)). No results found.    Assessment and Plan: 61 y.o. female with  Lumbago with radiculopathy bilaterally. Radicular symptoms are likely S1. Plan for x-ray of the lumbar spine given patient's worsening symptoms and history of surgery if it is warranted. Likely will obtain an MRI in the near future as well. We'll start empiric treatment with physical therapy and recheck following MRI. Gabapentin refilled.     Orders Placed This Encounter  Procedures  . DG Lumbar Spine Complete    Standing Status:   Future    Number of Occurrences:   1    Standing Expiration Date:   08/16/2018    Order Specific Question:   Reason for Exam (SYMPTOM  OR DIAGNOSIS REQUIRED)    Answer:   eval lumbago and likely S1 radiculopathy BL    Order Specific Question:   Is patient pregnant?    Answer:   No    Order Specific Question:    Preferred imaging location?    Answer:   [facility]    Order Specific Question:   Radiology Contrast Protocol - do NOT remove file path    Answer:   [email or web address]  . Ambulatory referral to Physical Therapy    Referral Priority:   Routine    Referral Type:   Physical Medicine    Referral Reason:   Specialty Services Required    Requested Specialty:   Physical Therapy   Meds ordered this encounter  Medications  . gabapentin (NEURONTIN) 300 MG capsule    Sig: Take 3 capsules (900 mg total) by mouth 3 (three) times daily.  Dispense:  270 capsule    Refill:  11    Discussed warning signs or symptoms. Please see discharge instructions. Patient expresses understanding.

## 2017-06-17 ENCOUNTER — Telehealth: Payer: Self-pay | Admitting: Family Medicine

## 2017-06-17 DIAGNOSIS — M5136 Other intervertebral disc degeneration, lumbar region: Secondary | ICD-10-CM

## 2017-06-17 NOTE — Telephone Encounter (Signed)
MRI ordered

## 2017-06-19 ENCOUNTER — Other Ambulatory Visit: Payer: Self-pay | Admitting: Cardiology

## 2017-06-23 ENCOUNTER — Ambulatory Visit (INDEPENDENT_AMBULATORY_CARE_PROVIDER_SITE_OTHER): Payer: Federal, State, Local not specified - PPO

## 2017-06-23 DIAGNOSIS — M5136 Other intervertebral disc degeneration, lumbar region: Secondary | ICD-10-CM

## 2017-06-23 DIAGNOSIS — M5116 Intervertebral disc disorders with radiculopathy, lumbar region: Secondary | ICD-10-CM

## 2017-06-23 DIAGNOSIS — M545 Low back pain: Secondary | ICD-10-CM | POA: Diagnosis not present

## 2017-06-26 ENCOUNTER — Encounter: Payer: Self-pay | Admitting: Family Medicine

## 2017-06-26 ENCOUNTER — Ambulatory Visit (INDEPENDENT_AMBULATORY_CARE_PROVIDER_SITE_OTHER): Payer: Federal, State, Local not specified - PPO | Admitting: Family Medicine

## 2017-06-26 ENCOUNTER — Telehealth: Payer: Self-pay

## 2017-06-26 VITALS — BP 160/92 | HR 89

## 2017-06-26 DIAGNOSIS — M25462 Effusion, left knee: Secondary | ICD-10-CM

## 2017-06-26 DIAGNOSIS — M5136 Other intervertebral disc degeneration, lumbar region: Secondary | ICD-10-CM

## 2017-06-26 NOTE — Progress Notes (Signed)
[name] is a 61 y.o. female who presents to [place] today for knee pain.  Knee pain: Patient reports waking up 2 days ago with burning left knee pain. She denies any inciting events, including trauma to knee, twisting, or increased activity. Patient has tried using a lidocaine patch, diclofenac gel, and Bengay, all of which did not provide much relief. Patient feels that there is some catching in the back of her knee and has a sensation that her knee cap is out of joint. Patient denies any history of gout, excessive alcohol use, or recent changes in diet.   Additionally patient is here today to follow-up her back pain. She described worsening low back pain radiating to the bilateral lateral feet. She had an MRI in the interim. She notes continued back pain.  Patient denies any fevers, chills, recent illness, diarrhea, or chest pain.    Past Medical History:  Diagnosis Date  . Bipolar disorder ([place])   . CHF (congestive heart failure) ([place])   . Chronic diastolic heart failure (HCC)    hospitalized at [facility] regional  . Complication of anesthesia    had "breathing problems and ended up in ICU" 03/2010 report on chart  . DDD (degenerative disc disease), lumbar   . 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 GERD (gastroesophageal reflux disease)   . Glucose intolerance (impaired glucose tolerance)   . HLD (hyperlipidemia)    nec/nos  . HTN (hypertension)    benign essential  . MVP (mitral valve prolapse)   . Narcolepsy   . Suicide attempt ([place])    hx  . Syncope    admx to [facility] in 2/12: Echo with normal LVF; head CT unremarkable, ECG stable, no further w/u  . Ulcer    Past Surgical History:  Procedure Laterality Date  . BLADDER SUSPENSION    . GASTRIC BYPASS    . LUMBAR LAMINECTOMY/DECOMPRESSION MICRODISCECTOMY  09/11/2011   Procedure: LUMBAR LAMINECTOMY/DECOMPRESSION MICRODISCECTOMY;  Surgeon: Tobi Bastos;   Location: [facility];  Service: Orthopedics;  Laterality: Right;  Hemi Laminectomy/Microdiscectomy L5 - S1 on the Right (X-Ray)  . PARTIAL HYSTERECTOMY    . REPAIR PERONEAL TENDONS ANKLE     Social History  Substance Use Topics  . Smoking status: Former Smoker    Quit date: 10/08/2003  . Smokeless tobacco: Never Used  . Alcohol use Yes     Comment: rare     ROS:  As above   Medications: Current Outpatient Prescriptions  Medication Sig Dispense Refill  . amLODipine-olmesartan (AZOR) 10-40 MG tablet TAKE ONE TABLET BY MOUTH ONCE DAILY 90 tablet 2  . atorvastatin (LIPITOR) 80 MG tablet Take 80 mg by mouth at bedtime.    . clonazePAM (KLONOPIN) 1 MG tablet Take 1 tablet (1 mg total) by mouth daily as needed for anxiety. 30 tablet 0  . cloNIDine (CATAPRES) 0.2 MG tablet TAKE TWO TABLETS BY MOUTH 3 TIMES A DAY 180 tablet 11  . Diclofenac Sodium 3 % GEL Place onto the skin.    . DULoxetine (CYMBALTA) 60 MG capsule Take 1 capsule (60 mg total) by mouth every evening. 90 capsule 1  . EQ ASPIRIN 325 MG tablet Take 325 mg by mouth daily.    [name] esomeprazole (NEXIUM) 40 MG capsule Take 40 mg by mouth 2 (two) times daily as needed. 1 capsule daily scheduled, will take an additional capsule if needed    . folic acid (  FOLVITE) 1 MG tablet Take 1 mg by mouth daily.     . furosemide (LASIX) 20 MG tablet Take 1 tablet (20 mg total) by mouth daily. 30 tablet 5  . gabapentin (NEURONTIN) 300 MG capsule Take 3 capsules (900 mg total) by mouth 3 (three) times daily. 270 capsule 11  . hydrALAZINE (APRESOLINE) 100 MG tablet Take 1 tablet (100 mg total) by mouth 3 (three) times daily. 90 tablet 5  . ipratropium (ATROVENT) 0.03 % nasal spray Place 2 sprays into both nostrils 2 (two) times daily as needed (nasal congestion, ear discomfort). 30 mL 0  . labetalol (NORMODYNE) 300 MG tablet TAKE ONE TABLET BY MOUTH TWICE DAILY 60 tablet 5  . lidocaine (XYLOCAINE) 5 % ointment Apply 1 application topically as needed.      . meloxicam (MOBIC) 15 MG tablet One tab PO qAM with breakfast for 2 weeks, then daily prn pain. 30 tablet 3  . Multiple Vitamins-Minerals (MULTIVITAMINS THER. W/MINERALS) TABS Take 1 tablet by mouth daily.      [name] omeprazole (PRILOSEC) 20 MG capsule Take 2 capsules (40 mg total) by mouth daily. 30 capsule 0  . spironolactone (ALDACTONE) 50 MG tablet TAKE ONE TABLET BY MOUTH EVERY DAY 30 tablet 11  . VENTOLIN HFA 108 (90 BASE) MCG/ACT inhaler prn     No current facility-administered medications for this visit.    Allergies  Allergen Reactions  . Codeine Nausea And Vomiting     Exam:  BP (!) 160/92   Pulse 89  General: Well Developed, well nourished, and in no acute distress.  Neuro/Psych: Alert and oriented x3, extra-ocular muscles intact, able to move all 4 extremities, sensation grossly intact. Skin: Warm and dry, no rashes noted.  Respiratory: Not using accessory muscles, speaking in full sentences, trachea midline.  Cardiovascular: Pulses palpable, no extremity edema. Abdomen: Does not appear distended. MSK:  Left Knee: No gross deformity or bruising on inspection, Effusion present Tenderness to palpation diffusely, warmth appreciated Range of motion is limited by pain, full extension of knee is especially painful Strength is limited by pain Stable ligamentous exam  Aspiration and injection. Consent obtained and timeout performed. Skin cleaned with alcohol and cold spray applied. 1 mL of lidocaine injected achieving good anesthesia. Skin again cleaned with alcohol and an 18-gauge needle was used to access the superior patellar space. 45 mL of cloudy fluid was aspirated. The syringe was exchanged and 80 mg of Depo-Medrol and 4 mL of Marcaine were injected. Patient had significant improvement in pain following injection. She tolerated procedure well.   No results found for this or any previous visit (from the past 48 hour(s)). Mr Lumbar Spine Wo Contrast  Result Date:  06/23/2017 CLINICAL DATA:  Low back pain radiating into lower legs with numbness soreness. Bilateral little toe pain. Symptoms for 1 month. History of prior surgery. EXAM: MRI LUMBAR SPINE WITHOUT CONTRAST TECHNIQUE: Multiplanar, multisequence MR imaging of the lumbar spine was performed. No intravenous contrast was administered. COMPARISON:  CT abdomen and pelvis 07/06/2008. FINDINGS: Segmentation:  Standard. Alignment:  Maintained. Vertebrae: No fracture or worrisome lesion. There is degenerative endplate signal change at L5-S1. Conus medullaris: Extends to the T12-L1 level and appears normal. Paraspinal and other soft tissues: Degenerative disease about the SI joints is noted. Otherwise negative. Disc levels: T11-12: Negative. T12-L1:  Negative. L1-2:  Negative. L2-3:  Negative. L3-4: There is some ligamentum flavum thickening and a shallow disc bulge but the central spinal canal and neural foramina remain open.  L4-5: Shallow disc bulge and ligamentum flavum thickening cause mild central canal narrowing. The foramina are open. There is facet degenerative change with mild marrow edema in the right facets. L5-S1: Postoperative change on the right. Shallow disc bulge endplate spur without stenosis is identified. IMPRESSION: Mild degenerative disc disease lower lumbar spine without central canal or foraminal stenosis. Also seen is scattered facet degenerative disease most notable on the right at L4-5 where there is mild marrow edema in the facets. Electronically Signed   By: Inge Rise M.D.   On: 06/23/2017 09:06      Assessment and Plan: 61 y.o. female with knee pain. Given the acute onset of symptoms, lack of inciting event, and physical findings, this is most likely gout. Ultrasound of the knee was performed in clinic today and showed a knee effusion. The fluid was aspirated and sent for cell count, culture, and crystal analysis. Steroid was injected into the knee to decrease inflammation. Patient will  be contacted for further steps once analysis of aspirate is performed. Patient will follow-up in clinic in 4 weeks.   Back pain with radicular symptoms: Based an MRI concerning for some radicular component. Plan for epidural steroid injection.    Orders Placed This Encounter  Procedures  . Anaerobic and Aerobic Culture  . Cell Count and Diff, Fluid, Other  . Synovial fluid, crystal   No orders of the defined types were placed in this encounter.   Discussed warning signs or symptoms. Please see discharge instructions. Patient expresses understanding.

## 2017-06-26 NOTE — Patient Instructions (Signed)
Thank you for coming in today. You should hear about the back injection soon.  Recheck with me in 4 weeks.  Return sooner if needed.  Call or go to the ER if you develop a large red swollen joint with extreme pain or oozing puss.

## 2017-06-26 NOTE — Telephone Encounter (Signed)
Pt called back for MRI results, given results and transferred to scheduling for appt.

## 2017-06-27 LAB — CELL COUNT AND DIFF, FLUID, OTHER
Basophils, %: 0 %
Eosinophils, %: 1 %
LYMPHOCYTES, %: 29 %
MONOCYTE/MACROPHAGE %: 25 %
Mesothelial, %: 0 %
NEUTROPHILS, %: 45 %
TOTAL NUCLEATED CELL CT: 91 {cells}/uL

## 2017-06-27 LAB — SYNOVIAL FLUID, CRYSTAL

## 2017-07-02 LAB — ANAEROBIC AND AEROBIC CULTURE
AER RESULT: NO GROWTH
MICRO NUMBER: 81043909
MICRO NUMBER:: 81043908
SPECIMEN QUALITY:: ADEQUATE
SPECIMEN QUALITY:: ADEQUATE

## 2017-07-12 ENCOUNTER — Other Ambulatory Visit: Payer: Self-pay | Admitting: Cardiology

## 2017-07-14 NOTE — Telephone Encounter (Signed)
Rx request sent to pharmacy.  

## 2017-07-18 ENCOUNTER — Ambulatory Visit
Admission: RE | Admit: 2017-07-18 | Discharge: 2017-07-18 | Disposition: A | Discharge status: Home / Self Care | Payer: Federal, State, Local not specified - PPO | Source: Ambulatory Visit | Attending: Family Medicine | Admitting: Family Medicine

## 2017-07-18 ENCOUNTER — Other Ambulatory Visit: Payer: Self-pay | Admitting: Family Medicine

## 2017-07-18 VITALS — BP 178/106 | HR 63

## 2017-07-18 DIAGNOSIS — M5136 Other intervertebral disc degeneration, lumbar region: Secondary | ICD-10-CM

## 2017-07-18 DIAGNOSIS — E7800 Pure hypercholesterolemia, unspecified: Secondary | ICD-10-CM

## 2017-07-18 DIAGNOSIS — M47817 Spondylosis without myelopathy or radiculopathy, lumbosacral region: Secondary | ICD-10-CM | POA: Diagnosis not present

## 2017-07-18 MED ORDER — IOPAMIDOL (ISOVUE-M 200) INJECTION 41%
1.0000 mL | Freq: Once | INTRAMUSCULAR | Status: AC
  Administered 2017-07-18: 1 mL via EPIDURAL

## 2017-07-18 MED ORDER — METHYLPREDNISOLONE ACETATE 40 MG/ML INJ SUSP (RADIOLOG
120.0000 mg | Freq: Once | INTRAMUSCULAR | Status: AC
  Administered 2017-07-18: 120 mg via EPIDURAL

## 2017-07-18 NOTE — Discharge Instructions (Signed)

## 2017-08-19 ENCOUNTER — Other Ambulatory Visit: Payer: Self-pay | Admitting: Family Medicine

## 2017-09-18 ENCOUNTER — Other Ambulatory Visit: Payer: Self-pay | Admitting: Family Medicine

## 2017-09-23 ENCOUNTER — Ambulatory Visit (INDEPENDENT_AMBULATORY_CARE_PROVIDER_SITE_OTHER): Payer: Federal, State, Local not specified - PPO | Admitting: Family Medicine

## 2017-09-23 ENCOUNTER — Ambulatory Visit (INDEPENDENT_AMBULATORY_CARE_PROVIDER_SITE_OTHER): Payer: Federal, State, Local not specified - PPO

## 2017-09-23 ENCOUNTER — Encounter: Payer: Self-pay | Admitting: Family Medicine

## 2017-09-23 VITALS — BP 150/93 | HR 80 | Wt 203.0 lb

## 2017-09-23 DIAGNOSIS — M25562 Pain in left knee: Secondary | ICD-10-CM

## 2017-09-23 DIAGNOSIS — M1711 Unilateral primary osteoarthritis, right knee: Secondary | ICD-10-CM

## 2017-09-23 DIAGNOSIS — M179 Osteoarthritis of knee, unspecified: Secondary | ICD-10-CM | POA: Diagnosis not present

## 2017-09-23 NOTE — Progress Notes (Signed)
[name] is a 61 y.o. female who presents to [facility] [place] today for left knee pain.  [name] has chronic left knee pain ongoing now for months.  She was last seen about 3 months ago for this where she was found to have a knee effusion.  Aspiration and injection revealed sterile joint fluid with no gout crystals.  She had good pain relief following injection for a few weeks.  She notes the pain is been worsening recently.  She denies any locking or catching or giving way.  The pain is quite severe at times.   Past Medical History:  Diagnosis Date  . Bipolar disorder ([place])   . CHF (congestive heart failure) ([place])   . Chronic diastolic heart failure (HCC)    hospitalized at [facility]  . Complication of anesthesia    had "breathing problems and ended up in ICU" 03/2010 report on chart  . DDD (degenerative disc disease), lumbar   . 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 GERD (gastroesophageal reflux disease)   . Glucose intolerance (impaired glucose tolerance)   . HLD (hyperlipidemia)    nec/nos  . HTN (hypertension)    benign essential  . MVP (mitral valve prolapse)   . Narcolepsy   . Suicide attempt ([place])    hx  . Syncope    admx to [facility] in 2/12: Echo with normal LVF; head CT unremarkable, ECG stable, no further w/u  . Ulcer    Past Surgical History:  Procedure Laterality Date  . BLADDER SUSPENSION    . GASTRIC BYPASS    . LUMBAR LAMINECTOMY/DECOMPRESSION MICRODISCECTOMY  09/11/2011   Procedure: LUMBAR LAMINECTOMY/DECOMPRESSION MICRODISCECTOMY;  Surgeon: Tobi Bastos;  Location: [facility];  Service: Orthopedics;  Laterality: Right;  Hemi Laminectomy/Microdiscectomy L5 - S1 on the Right (X-Ray)  . PARTIAL HYSTERECTOMY    . REPAIR PERONEAL TENDONS ANKLE     Social History   Tobacco Use  . Smoking status: Former Smoker    Last attempt to  quit: 10/08/2003    Years since quitting: 13.9  . Smokeless tobacco: Never Used  Substance Use Topics  . Alcohol use: Yes    Comment: rare   family history includes Diabetes in her mother; Heart attack (age of onset: 80) in her father; Hypertension in her father and mother; Stroke in her other.  ROS as above:  Medications: Current Outpatient Medications  Medication Sig Dispense Refill  . amLODipine-olmesartan (AZOR) 10-40 MG tablet Take 1 tablet by mouth daily. Please schedule appointment for refills. 30 tablet 3  . atorvastatin (LIPITOR) 80 MG tablet Take 80 mg by mouth at bedtime.    . clonazePAM (KLONOPIN) 1 MG tablet Take 1 tablet (1 mg total) by mouth daily as needed for anxiety. 30 tablet 0  . cloNIDine (CATAPRES) 0.2 MG tablet TAKE TWO TABLETS BY MOUTH 3 TIMES A DAY 180 tablet 11  . Diclofenac Sodium 3 % GEL Place onto the skin.    . DULoxetine (CYMBALTA) 60 MG capsule Take 1 capsule (60 mg total) every evening by mouth. PLEASE CALL OFFICE TO OBTAIN ADDITIONAL REFILLS. 30 capsule 1  . EQ ASPIRIN 325 MG tablet Take 325 mg by mouth daily.    [name] esomeprazole (NEXIUM) 40 MG capsule Take 40 mg by mouth 2 (two) times daily as needed. 1 capsule daily scheduled, will take an additional capsule if needed    . folic acid (  FOLVITE) 1 MG tablet Take 1 mg by mouth daily.     . furosemide (LASIX) 20 MG tablet TAKE ONE TABLET BY MOUTH EVERY DAY 30 tablet 11  . gabapentin (NEURONTIN) 300 MG capsule Take 3 capsules (900 mg total) by mouth 3 (three) times daily. 270 capsule 11  . hydrALAZINE (APRESOLINE) 100 MG tablet TAKE ONE TABLET BY MOUTH 3 TIMES A DAY 90 tablet 11  . ipratropium (ATROVENT) 0.03 % nasal spray Place 2 sprays into both nostrils 2 (two) times daily as needed (nasal congestion, ear discomfort). 30 mL 0  . labetalol (NORMODYNE) 300 MG tablet TAKE ONE TABLET BY MOUTH TWICE DAILY 60 tablet 5  . lidocaine (XYLOCAINE) 5 % ointment Apply 1 application topically as needed.    . meloxicam  (MOBIC) 15 MG tablet One tab PO qAM with breakfast for 2 weeks, then daily prn pain. 30 tablet 3  . Multiple Vitamins-Minerals (MULTIVITAMINS THER. W/MINERALS) TABS Take 1 tablet by mouth daily.      [name] omeprazole (PRILOSEC) 20 MG capsule Take 2 capsules (40 mg total) by mouth daily. 30 capsule 0  . spironolactone (ALDACTONE) 50 MG tablet TAKE ONE TABLET BY MOUTH EVERY DAY 30 tablet 11  . VENTOLIN HFA 108 (90 BASE) MCG/ACT inhaler prn     No current facility-administered medications for this visit.    Allergies  Allergen Reactions  . Codeine Nausea And Vomiting    Health Maintenance Health Maintenance  Topic Date Due  . PAP SMEAR  05/13/1978  . COLONOSCOPY  05/14/2007  . MAMMOGRAM  12/28/2016  . INFLUENZA VACCINE  05/07/2017  . TETANUS/TDAP  12/20/2024  . Hepatitis C Screening  Completed  . HIV Screening  Completed     Exam:  BP (!) 150/93   Pulse 80   Wt 203 lb (92.1 kg)   BMI 32.77 kg/m  Gen: Well NAD Left knee mild effusion. Range of motion 0-100 degrees. Mildly tender to palpation medial joint line.  Xray left knee shows medial compartment degenerative changes.  No acute fractures.  Awaiting formal radiology review  Procedure: Real-time Ultrasound Guided Injection of left knee  Device: GE Logiq E  Images permanently stored and available for review in the ultrasound unit. Verbal informed consent obtained. Discussed risks and benefits of procedure. Warned about infection bleeding damage to structures skin hypopigmentation and fat atrophy among others. Patient expresses understanding and agreement Time-out conducted.  Noted no overlying erythema, induration, or other signs of local infection.  Skin prepped in a sterile fashion.  Local anesthesia: Topical Ethyl chloride.  With sterile technique and under real time ultrasound guidance: 80mg  depomedrol and 22ml marcaine injected easily.  Completed without difficulty  Pain immediately resolved suggesting accurate  placement of the medication.  Advised to call if fevers/chills, erythema, induration, drainage, or persistent bleeding.  Images permanently stored and available for review in the ultrasound unit.  Impression: Technically successful ultrasound guided injection.    No results found for this or any previous visit (from the past 72 hour(s)). No results found.    Assessment and Plan: 61 y.o. female with  Left knee pain due to DJD.  Plan for repeat steroid injection today.  We will also get the process started to authorize hyaluronic acid injections. Check if not better.   Orders Placed This Encounter  Procedures  . DG Knee 1-2 Views Right    Standing Status:   Future    Number of Occurrences:   1    Standing Expiration Date:  11/24/2018    Order Specific Question:   Reason for Exam (SYMPTOM  OR DIAGNOSIS REQUIRED)    Answer:   For use with the left knee x-ray bilateral AP and Rosenberg standing.    Order Specific Question:   Is the patient pregnant?    Answer:   No    Order Specific Question:   Preferred imaging location?    Answer:   [facility]  . DG Knee Complete 4 Views Left    Please include patellar sunrise, lateral, and weightbearing bilateral AP and bilateral rosenberg views    Standing Status:   Future    Number of Occurrences:   1    Standing Expiration Date:   11/23/2018    Order Specific Question:   Reason for exam:    Answer:   Please include patellar sunrise, lateral, and weightbearing bilateral AP and bilateral rosenberg views    Comments:   Please include patellar sunrise, lateral, and weightbearing bilateral AP and bilateral rosenberg views    Order Specific Question:   Preferred imaging location?    Answer:   [facility]   No orders of the defined types were placed in this encounter.    Discussed warning signs or symptoms. Please see discharge instructions. Patient expresses understanding.

## 2017-09-23 NOTE — Patient Instructions (Signed)
Thank you for coming in today. Call or go to the ER if you develop a large red swollen joint with extreme pain or oozing puss.  We will try to get Orthovisc approved.  Let me know if you do not hear anything.

## 2017-10-10 ENCOUNTER — Telehealth: Payer: Self-pay | Admitting: Family Medicine

## 2017-10-10 NOTE — Telephone Encounter (Signed)
Submitted for approval on Orthovisc. Awaiting confirmation.  

## 2017-10-10 NOTE — Telephone Encounter (Signed)
-----   Message from [name], MD sent at 09/23/2017  7:41 PM EST ----- Regarding: Orthovisc Please get started on Orthovisc for [place].  Thanks, Ellard Artis

## 2017-10-16 ENCOUNTER — Other Ambulatory Visit: Payer: Self-pay | Admitting: Cardiology

## 2017-10-16 ENCOUNTER — Other Ambulatory Visit: Payer: Self-pay | Admitting: Family Medicine

## 2017-11-13 ENCOUNTER — Other Ambulatory Visit: Payer: Self-pay | Admitting: Cardiology

## 2017-11-13 NOTE — Telephone Encounter (Signed)
REFILL 

## 2017-11-14 ENCOUNTER — Other Ambulatory Visit: Payer: Self-pay | Admitting: Cardiology

## 2017-12-12 ENCOUNTER — Other Ambulatory Visit: Payer: Self-pay | Admitting: Family Medicine

## 2017-12-18 ENCOUNTER — Telehealth: Payer: Self-pay | Admitting: Family Medicine

## 2017-12-18 NOTE — Telephone Encounter (Signed)
I called pt and left VM stating to call the office and schedule a f/u on her BP with Dr. Madilyn Fireman

## 2018-01-06 ENCOUNTER — Ambulatory Visit (INDEPENDENT_AMBULATORY_CARE_PROVIDER_SITE_OTHER): Payer: Federal, State, Local not specified - PPO | Admitting: Physician Assistant

## 2018-01-06 ENCOUNTER — Ambulatory Visit (INDEPENDENT_AMBULATORY_CARE_PROVIDER_SITE_OTHER): Payer: Federal, State, Local not specified - PPO

## 2018-01-06 ENCOUNTER — Encounter: Payer: Self-pay | Admitting: Physician Assistant

## 2018-01-06 ENCOUNTER — Telehealth: Payer: Self-pay | Admitting: Family Medicine

## 2018-01-06 VITALS — BP 146/86 | HR 93 | Temp 98.3°F | Ht 66.0 in | Wt 205.0 lb

## 2018-01-06 DIAGNOSIS — R05 Cough: Secondary | ICD-10-CM

## 2018-01-06 DIAGNOSIS — Z87891 Personal history of nicotine dependence: Secondary | ICD-10-CM

## 2018-01-06 DIAGNOSIS — J209 Acute bronchitis, unspecified: Secondary | ICD-10-CM | POA: Diagnosis not present

## 2018-01-06 DIAGNOSIS — R059 Cough, unspecified: Secondary | ICD-10-CM

## 2018-01-06 MED ORDER — IPRATROPIUM-ALBUTEROL 0.5-2.5 (3) MG/3ML IN SOLN
3.0000 mL | Freq: Once | RESPIRATORY_TRACT | Status: AC
  Administered 2018-01-06: 3 mL via RESPIRATORY_TRACT

## 2018-01-06 MED ORDER — PREDNISONE 50 MG PO TABS: ORAL_TABLET | ORAL | 0 refills | Status: DC

## 2018-01-06 MED ORDER — HYDROCODONE-HOMATROPINE 5-1.5 MG/5ML PO SYRP: 5.0000 mL | ORAL_SOLUTION | Freq: Two times a day (BID) | ORAL | 0 refills | Status: DC | PRN

## 2018-01-06 NOTE — Patient Instructions (Signed)

## 2018-01-06 NOTE — Telephone Encounter (Signed)
Left message for patient to call back about injections.

## 2018-01-06 NOTE — Progress Notes (Signed)
Subjective:    Patient ID: [name], female    DOB: 1956/01/18, 62 y.o.   MRN: [id number]  HPI [name] is a 62 year female who presents today with a cough, congestion, ear pain for the past week. She denies any fevers, chills. She has attempted using her albuterol inhaler, OTC cold medicine, a shot of liquor + honey with no relief. She has also been a little short of breath and has noticed some "puffiness in her feet"  .[name] Active Ambulatory Problems    Diagnosis Date Noted  . Hyperlipidemia 07/15/2007  . BIPOLAR DISORDER UNSPECIFIED 05/17/2009  . HIRSUTISM 06/02/2009  . Pain in limb 08/03/2008  . SLEEP DISORDER 08/31/2008  . FATIGUE 07/15/2007  . DYSPNEA 05/17/2009  . Abnormal blood sugar 12/22/2007  . Syncope and collapse 08/08/2011  . DDD (degenerative disc disease), lumbar   . History of CVA (cerebrovascular accident) 03/13/2016  . Essential hypertension, malignant 06/12/2016  . Right cervical radiculopathy 08/09/2016  . Acute ischemic right MCA stroke ([place]) 07/31/2016  . 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 Mitral valve prolapse syndrome 08/15/2016  . Narcolepsy 03/13/2013  . Nerve compression 08/15/2016  . Congestive heart failure ([place]) 08/15/2016  . Coronary atherosclerosis 03/13/2013   Resolved Ambulatory Problems    Diagnosis Date Noted  . CHEST PAIN UNSPECIFIED 05/17/2009  . 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 Right ear pain 03/19/2016   Past Medical History:  Diagnosis Date  . Bipolar disorder ([place])   . CHF (congestive heart failure) ([place])   . Chronic diastolic heart failure ([place])   . Complication of anesthesia   . DDD (degenerative disc disease), lumbar   . Diabetes in pregnancy   . GERD (gastroesophageal reflux disease)   . Glucose intolerance (impaired glucose tolerance)   . HLD (hyperlipidemia)   . HTN (hypertension)   . MVP (mitral valve prolapse)   . Narcolepsy   . Suicide attempt ([place])   .  Syncope   . Ulcer       Review of Systems  Constitutional: Negative for chills and fever.  HENT: Positive for ear pain, postnasal drip, sinus pressure and sinus pain.   Respiratory: Positive for cough and shortness of breath.   Cardiovascular: Positive for leg swelling. Negative for chest pain and palpitations.       Objective:   Physical Exam  Constitutional: She is oriented to person, place, and time. She appears well-developed and well-nourished. No distress.  HENT:  Head: Normocephalic and atraumatic.  Right Ear: Tympanic membrane and external ear normal.  Left Ear: Tympanic membrane and external ear normal.  Nose: Nose normal.  Mouth/Throat: Uvula is midline. No oropharyngeal exudate, posterior oropharyngeal edema or posterior oropharyngeal erythema.  Eyes: Conjunctivae are normal.  Cardiovascular: Normal rate, regular rhythm and normal heart sounds.  Pulmonary/Chest: Effort normal. She has wheezes ( miild inspiratory and expiratory).  Lungs mildy improved post duoneb treatment but still sounded tight with decreased air movement  Neurological: She is alert and oriented to person, place, and time.  Skin: Skin is warm and dry.  Scant edema bilateral top of feet and ankles.   Psychiatric: She has a normal mood and affect. Her behavior is normal.          Assessment & Plan:  Marland KitchenMarland KitchenDiagnoses and all orders for this visit:  Acute bronchitis, unspecified organism -     ipratropium-albuterol (DUONEB) 0.5-2.5 (3) MG/3ML nebulizer solution 3 mL -     HYDROcodone-homatropine (  HYCODAN) 5-1.5 MG/5ML syrup; Take 5 mLs by mouth every 12 (twelve) hours as needed. -     predniSONE (DELTASONE) 50 MG tablet; One tab PO daily for 5 days. -     DG Chest 2 View  Cough -     ipratropium-albuterol (DUONEB) 0.5-2.5 (3) MG/3ML nebulizer solution 3 mL -     HYDROcodone-homatropine (HYCODAN) 5-1.5 MG/5ML syrup; Take 5 mLs by mouth every 12 (twelve) hours as needed. -     predniSONE (DELTASONE)  50 MG tablet; One tab PO daily for 5 days. -     DG Chest 2 View   Nebulizer given in office with some symptomatic relief.  Continue albuterol inhaler every 2-4 hours.  Added prednisone burst for 5 days.  I would like to get CXR to rule out any pneumonia. Suspect viral bronchitis. If no improvement in the next few days could consider antibiotic.

## 2018-01-06 NOTE — Telephone Encounter (Signed)
Pt states she has been waiting to hear back on her knee injections that Dr,Corey was going to see if her insurance would approve. Please call pt and let her know. Thanks

## 2018-01-07 ENCOUNTER — Other Ambulatory Visit: Payer: Self-pay | Admitting: *Deleted

## 2018-01-07 DIAGNOSIS — H6981 Other specified disorders of Eustachian tube, right ear: Secondary | ICD-10-CM

## 2018-01-07 MED ORDER — LABETALOL HCL 300 MG PO TABS: 300.0000 mg | ORAL_TABLET | Freq: Two times a day (BID) | ORAL | 0 refills | Status: DC

## 2018-01-07 NOTE — Progress Notes (Signed)
Call pt: no pneumonia. Lungs do show some fluid and inflammation consistent with bronchitis. Once you start feeling better I think getting spirometry would be a good idea to look at overall lung function.

## 2018-01-08 ENCOUNTER — Telehealth: Payer: Self-pay

## 2018-01-08 MED ORDER — AZITHROMYCIN 250 MG PO TABS: ORAL_TABLET | ORAL | 0 refills | Status: DC

## 2018-01-08 NOTE — Telephone Encounter (Signed)
Pt not feeling better. Sent over zpak.

## 2018-01-08 NOTE — Telephone Encounter (Signed)
Patient will see if she has the money for the injections and call back to set up an appointment.

## 2018-01-12 ENCOUNTER — Ambulatory Visit (INDEPENDENT_AMBULATORY_CARE_PROVIDER_SITE_OTHER): Payer: Federal, State, Local not specified - PPO | Admitting: Family Medicine

## 2018-01-12 ENCOUNTER — Encounter: Payer: Self-pay | Admitting: Family Medicine

## 2018-01-12 VITALS — BP 170/96 | HR 103 | Ht 66.0 in | Wt 204.0 lb

## 2018-01-12 DIAGNOSIS — J209 Acute bronchitis, unspecified: Secondary | ICD-10-CM | POA: Diagnosis not present

## 2018-01-12 DIAGNOSIS — R05 Cough: Secondary | ICD-10-CM

## 2018-01-12 DIAGNOSIS — I509 Heart failure, unspecified: Secondary | ICD-10-CM

## 2018-01-12 DIAGNOSIS — R059 Cough, unspecified: Secondary | ICD-10-CM

## 2018-01-12 MED ORDER — HYDROCODONE-HOMATROPINE 5-1.5 MG/5ML PO SYRP: 5.0000 mL | ORAL_SOLUTION | Freq: Two times a day (BID) | ORAL | 0 refills | Status: DC | PRN

## 2018-01-12 MED ORDER — DOXYCYCLINE HYCLATE 100 MG PO TABS: 100.0000 mg | ORAL_TABLET | Freq: Two times a day (BID) | ORAL | 0 refills | Status: DC

## 2018-01-12 NOTE — Patient Instructions (Addendum)
Increase lasix to 2 tabs twice a day for the next 3 days.  Weight your self daily on your scale.

## 2018-01-12 NOTE — Progress Notes (Signed)
Subjective:    Patient ID: [name], female    DOB: Sep 21, 1956, 62 y.o.   MRN: [id number]  HPI  62 -year-old female was seen in the office on April 2 and diagnosed with acute bronchitis.  She was given a nebulizer treatment in the office as well as an albuterol inhaler for home, Hycodan cough syrup, and some prednisone.  She called back a couple days later on April 4 and was sent in a prescription for azithromycin.  He also had a chest x-ray done which was negative.  She is back today because she is just not feeling much better.  She is also been trying some over-the-counter Zyrtec. She is coughing up thick green phlegm.   She says her hands and feet have been a little swollen. No ST.  No sinus congestion.    Review of Systems  BP (!) 175/96   Pulse (!) 103   Ht 5\' 6"  (1.676 m)   Wt 204 lb (92.5 kg)   SpO2 99%   BMI 32.93 kg/m     Allergies  Allergen Reactions  . Codeine Nausea And Vomiting    Past Medical History:  Diagnosis Date  . Bipolar disorder ([place])   . CHF (congestive heart failure) ([place])   . Chronic diastolic heart failure ([facility])    hospitalized at [facility]  . Complication of anesthesia    had "breathing problems and ended up in ICU" 03/2010 report on chart  . DDD (degenerative disc disease), lumbar   . 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 GERD (gastroesophageal reflux disease)   . Glucose intolerance (impaired glucose tolerance)   . HLD (hyperlipidemia)    nec/nos  . HTN (hypertension)    benign essential  . MVP (mitral valve prolapse)   . Narcolepsy   . Suicide attempt ([place])    hx  . Syncope    admx to [facility] in 2/12: Echo with normal LVF; head CT unremarkable, ECG stable, no further w/u  . Ulcer     Past Surgical History:  Procedure Laterality Date  . BLADDER SUSPENSION    . GASTRIC BYPASS    . LUMBAR LAMINECTOMY/DECOMPRESSION MICRODISCECTOMY  09/11/2011   Procedure: LUMBAR LAMINECTOMY/DECOMPRESSION MICRODISCECTOMY;  Surgeon:  Tobi Bastos;  Location: [facility];  Service: Orthopedics;  Laterality: Right;  Hemi Laminectomy/Microdiscectomy L5 - S1 on the Right (X-Ray)  . PARTIAL HYSTERECTOMY    . REPAIR PERONEAL TENDONS ANKLE      Social History   Socioeconomic History  . Marital status: Married    Spouse name: Not on file  . Number of children: Not on file  . Years of education: Not on file  . Highest education level: Not on file  Occupational History  . Not on file  Social Needs  . Financial resource strain: Not on file  . Food insecurity:    Worry: Not on file    Inability: Not on file  . Transportation needs:    Medical: Not on file    Non-medical: Not on file  Tobacco Use  . Smoking status: Former Smoker    Last attempt to quit: 10/08/2003    Years since quitting: 14.2  . Smokeless tobacco: Never Used  Substance and Sexual Activity  . Alcohol use: Yes    Comment: rare  . Drug use: No  . Sexual activity: Not on file  Lifestyle  . Physical activity:    Days per week: Not on file  Minutes per session: Not on file  . Stress: Not on file  Relationships  . Social connections:    Talks on phone: Not on file    Gets together: Not on file    Attends religious service: Not on file    Active member of club or organization: Not on file    Attends meetings of clubs or organizations: Not on file    Relationship status: Not on file  . Intimate partner violence:    Fear of current or ex partner: Not on file    Emotionally abused: Not on file    Physically abused: Not on file    Forced sexual activity: Not on file  Other Topics Concern  . Not on file  Social History Narrative   Regularly exercises.     Family History  Problem Relation Age of Onset  . Heart attack Father 50  . Hypertension Father   . Diabetes Mother   . Hypertension Mother   . Stroke Other        grandmother    Outpatient Encounter Medications as of 01/12/2018  Medication Sig  . amLODipine-olmesartan (AZOR) 10-40 MG  tablet TAKE ONE TABLET BY MOUTH EVERY DAY  . atorvastatin (LIPITOR) 80 MG tablet Take 80 mg by mouth at bedtime.  . clonazePAM (KLONOPIN) 1 MG tablet Take 1 tablet (1 mg total) by mouth daily as needed for anxiety.  . cloNIDine (CATAPRES) 0.2 MG tablet TAKE TWO TABLETS BY MOUTH 3 TIMES A DAY  . Diclofenac Sodium 3 % GEL Place onto the skin.  . DULoxetine (CYMBALTA) 60 MG capsule Take 1 capsule (60 mg total) by mouth every evening. LAST REFILL. [place]. PLEASE CALL OFFICE AND SCHEDULE AN APPOINTMENT.  [name] EQ ASPIRIN 325 MG tablet Take 325 mg by mouth daily.  [name] esomeprazole (NEXIUM) 40 MG capsule Take 40 mg by mouth 2 (two) times daily as needed. 1 capsule daily scheduled, will take an additional capsule if needed  . folic acid (FOLVITE) 1 MG tablet Take 1 mg by mouth daily.   . furosemide (LASIX) 20 MG tablet TAKE ONE TABLET BY MOUTH EVERY DAY  . gabapentin (NEURONTIN) 300 MG capsule Take 3 capsules (900 mg total) by mouth 3 (three) times daily.  . hydrALAZINE (APRESOLINE) 100 MG tablet TAKE ONE TABLET BY MOUTH 3 TIMES A DAY  . HYDROcodone-homatropine (HYCODAN) 5-1.5 MG/5ML syrup Take 5 mLs by mouth every 12 (twelve) hours as needed.  [name] ipratropium (ATROVENT) 0.03 % nasal spray Place 2 sprays into both nostrils 2 (two) times daily as needed (nasal congestion, ear discomfort).  . labetalol (NORMODYNE) 300 MG tablet Take 1 tablet (300 mg total) by mouth 2 (two) times daily. NEED OV.  . lidocaine (XYLOCAINE) 5 % ointment Apply 1 application topically as needed.  . meloxicam (MOBIC) 15 MG tablet One tab PO qAM with breakfast for 2 weeks, then daily prn pain.  . Multiple Vitamins-Minerals (MULTIVITAMINS THER. W/MINERALS) TABS Take 1 tablet by mouth daily.    [name] omeprazole (PRILOSEC) 20 MG capsule Take 2 capsules (40 mg total) by mouth daily.  [name] spironolactone (ALDACTONE) 50 MG tablet TAKE ONE TABLET BY MOUTH EVERY DAY  . VENTOLIN HFA 108 (90 BASE) MCG/ACT inhaler prn  . [DISCONTINUED]  HYDROcodone-homatropine (HYCODAN) 5-1.5 MG/5ML syrup Take 5 mLs by mouth every 12 (twelve) hours as needed.  . doxycycline (VIBRA-TABS) 100 MG tablet Take 1 tablet (100 mg total) by mouth 2 (two) times daily.  . [DISCONTINUED] azithromycin (ZITHROMAX)  250 MG tablet Take 2 tablets now and then one tablet for 4 days.  . [DISCONTINUED] predniSONE (DELTASONE) 50 MG tablet One tab PO daily for 5 days.   No facility-administered encounter medications on file as of 01/12/2018.          Objective:   Physical Exam  Constitutional: She is oriented to person, place, and time. She appears well-developed and well-nourished.  HENT:  Head: Normocephalic and atraumatic.  Right Ear: External ear normal.  Left Ear: External ear normal.  Nose: Nose normal.  Mouth/Throat: Oropharynx is clear and moist.  TMs and canals are clear.   Eyes: Pupils are equal, round, and reactive to light. Conjunctivae and EOM are normal.  Neck: Neck supple. No thyromegaly present.  Cardiovascular: Normal rate, regular rhythm and normal heart sounds.  Pulmonary/Chest: Effort normal and breath sounds normal. She has no wheezes.  Lymphadenopathy:    She has no cervical adenopathy.  Neurological: She is alert and oriented to person, place, and time.  Skin: Skin is warm and dry.  Have some trace swelling on the tops of the feet.  Psychiatric: She has a normal mood and affect.       Assessment & Plan:  Bronchitis not improving.  At this point she is had symptoms for almost 2 weeks and feels like she just is extremely fatigued and she is really not getting any better.  Just completed a Z-Pak today.  We will try doxycycline.  If not better by the end of the week please let us know.  Refilled cough syrup as well.  Congestive heart failure-I do feel like she is volume overloaded she has some swelling in her feet and hands and she is up a couple pounds from her usual.  We will have her increase her Lasix to 2 tabs twice a day instead  of 1 tab twice a day which is what she is currently taking.  Asked her to weigh herself on her scale daily for the next few days and try to get a couple pounds of fluid off.  I am wondering if this may even help with some of the cough and shortness of breath.

## 2018-01-15 ENCOUNTER — Other Ambulatory Visit: Payer: Self-pay | Admitting: Cardiology

## 2018-02-06 ENCOUNTER — Other Ambulatory Visit: Payer: Self-pay | Admitting: Sports Medicine

## 2018-02-06 ENCOUNTER — Other Ambulatory Visit: Payer: Self-pay | Admitting: Cardiology

## 2018-02-06 DIAGNOSIS — M5416 Radiculopathy, lumbar region: Secondary | ICD-10-CM

## 2018-02-09 NOTE — Telephone Encounter (Signed)
REFILL 

## 2018-02-10 ENCOUNTER — Encounter: Payer: Self-pay | Admitting: Family Medicine

## 2018-02-10 ENCOUNTER — Ambulatory Visit (INDEPENDENT_AMBULATORY_CARE_PROVIDER_SITE_OTHER): Payer: Medicare Other | Admitting: Family Medicine

## 2018-02-10 VITALS — BP 155/83 | HR 87 | Ht 66.0 in | Wt 202.0 lb

## 2018-02-10 DIAGNOSIS — Z0000 Encounter for general adult medical examination without abnormal findings: Secondary | ICD-10-CM | POA: Diagnosis not present

## 2018-02-10 DIAGNOSIS — Z1211 Encounter for screening for malignant neoplasm of colon: Secondary | ICD-10-CM | POA: Diagnosis not present

## 2018-02-10 DIAGNOSIS — Z636 Dependent relative needing care at home: Secondary | ICD-10-CM | POA: Diagnosis not present

## 2018-02-10 DIAGNOSIS — Z1231 Encounter for screening mammogram for malignant neoplasm of breast: Secondary | ICD-10-CM

## 2018-02-10 MED ORDER — FUROSEMIDE 20 MG PO TABS: 20.0000 mg | ORAL_TABLET | Freq: Every day | ORAL | 0 refills | Status: DC

## 2018-02-10 MED ORDER — BUSPIRONE HCL 5 MG PO TABS: 5.0000 mg | ORAL_TABLET | Freq: Two times a day (BID) | ORAL | 0 refills | Status: DC

## 2018-02-10 NOTE — Progress Notes (Signed)
Subjective:     [name] is a 62 y.o. female and is here for a comprehensive physical exam. The patient reports problems - stress.  She is feeling stressed and overwhelmed being the primary caretaker of her husband.  She said he had a head on collision that resulted in a significant head injury about 6 years ago and ever since then he has had progressive dementia and Parkinson's disease.  They have diagnosed with Lewy body dementia.  She just says it is very difficult to take care of him.  He is a fall risk.  They have birds and 2 dogs and he tends to overfeed them.  He will make a mess and then not clean it up and she feels that she is constantly going behind him.  He is also somewhat irritable so he argues with her a lot.  She just feels like she is feeling very stressed and overwhelmed and that her nerves are always on edge.  She already currently takes Cymbalta.  Social History   Socioeconomic History  . Marital status: Married    Spouse name: Not on file  . Number of children: Not on file  . Years of education: Not on file  . Highest education level: Not on file  Occupational History  . Not on file  Social Needs  . Financial resource strain: Not on file  . Food insecurity:    Worry: Not on file    Inability: Not on file  . Transportation needs:    Medical: Not on file    Non-medical: Not on file  Tobacco Use  . Smoking status: Former Smoker    Last attempt to quit: 10/08/2003    Years since quitting: 14.3  . Smokeless tobacco: Never Used  Substance and Sexual Activity  . Alcohol use: Yes    Comment: rare  . Drug use: No  . Sexual activity: Not on file  Lifestyle  . Physical activity:    Days per week: Not on file    Minutes per session: Not on file  . Stress: Not on file  Relationships  . Social connections:    Talks on phone: Not on file    Gets together: Not on file    Attends religious service: Not on file    Active member of club or organization: Not on file   Attends meetings of clubs or organizations: Not on file    Relationship status: Not on file  . Intimate partner violence:    Fear of current or ex partner: Not on file    Emotionally abused: Not on file    Physically abused: Not on file    Forced sexual activity: Not on file  Other Topics Concern  . Not on file  Social History Narrative   Regularly exercises.    Health Maintenance  Topic Date Due  . PAP SMEAR  05/13/1978  . COLONOSCOPY  05/14/2007  . MAMMOGRAM  12/28/2016  . INFLUENZA VACCINE  05/07/2018  . TETANUS/TDAP  12/20/2024  . Hepatitis C Screening  Completed  . HIV Screening  Completed    The following portions of the patient's history were reviewed and updated as appropriate: allergies, current medications, past family history, past medical history, past social history, past surgical history and problem list.  Review of Systems A comprehensive review of systems was negative.   Objective:    BP (!) 150/67   Pulse 87   Ht 5\' 6"  (1.676 m)   Wt 202  lb (91.6 kg)   SpO2 100%   BMI 32.60 kg/m  General appearance: alert, cooperative and appears stated age Head: Normocephalic, without obvious abnormality, atraumatic Eyes: conj clear, EOMI< PEERLA Ears: normal TM's and external ear canals both ears Nose: Nares normal. Septum midline. Mucosa normal. No drainage or sinus tenderness. Throat: lips, mucosa, and tongue normal; teeth and gums normal Neck: no adenopathy, no carotid bruit, no JVD, supple, symmetrical, trachea midline and thyroid not enlarged, symmetric, no tenderness/mass/nodules Back: symmetric, no curvature. ROM normal. No CVA tenderness. Lungs: clear to auscultation bilaterally Breasts: normal appearance, no masses or tenderness Heart: regular rate and rhythm, S1, S2 normal, no murmur, click, rub or gallop Abdomen: soft, non-tender; bowel sounds normal; no masses,  no organomegaly Extremities: extremities normal, atraumatic, no cyanosis or edema Pulses: 2+  and symmetric Skin: Skin color, texture, turgor normal. No rashes or lesions Lymph nodes: Cervical, supraclavicular, and axillary nodes normal. Neurologic: Alert and oriented X 3, normal strength and tone. Normal symmetric reflexes. Normal coordination and gait    Assessment:    Healthy female exam.     Plan:     See After Visit Summary for Counseling Recommendations   Keep up a regular exercise program and make sure you are eating a healthy diet Try to eat 4 servings of dairy a day, or if you are lactose intolerant take a calcium with vitamin D daily.  Your vaccines are up to date.  Due for screening colonoscopy. Referral placed.  New order placed for mammogram.    Caregiver burden/increased stress-we discussed options.  We will add buspirone.  5 mg twice a day.  I like to see her back in 1 month and that way we can adjust her medication and see how she is doing.  Also encouraged her to contact the [employer] since her husband is a veteran and see if he may qualify for any personal care services.  She does not really have any children that live very close by that can be supportive for her.

## 2018-02-10 NOTE — Patient Instructions (Addendum)
Follow up in 1 week for BP check with nurse.   Health Maintenance, Female Adopting a healthy lifestyle and getting preventive care can go a long way to promote health and wellness. Talk with your health care provider about what schedule of regular examinations is right for you. This is a good chance for you to check in with your provider about disease prevention and staying healthy. In between checkups, there are plenty of things you can do on your own. Experts have done a lot of research about which lifestyle changes and preventive measures are most likely to keep you healthy. Ask your health care provider for more information. Weight and diet Eat a healthy diet  Be sure to include plenty of vegetables, fruits, low-fat dairy products, and lean protein.  Do not eat a lot of foods high in solid fats, added sugars, or salt.  Get regular exercise. This is one of the most important things you can do for your health. ? Most adults should exercise for at least 150 minutes each week. The exercise should increase your heart rate and make you sweat (moderate-intensity exercise). ? Most adults should also do strengthening exercises at least twice a week. This is in addition to the moderate-intensity exercise.  Maintain a healthy weight  Body mass index (BMI) is a measurement that can be used to identify possible weight problems. It estimates body fat based on height and weight. Your health care provider can help determine your BMI and help you achieve or maintain a healthy weight.  For females 7 years of age and older: ? A BMI below 18.5 is considered underweight. ? A BMI of 18.5 to 24.9 is normal. ? A BMI of 25 to 29.9 is considered overweight. ? A BMI of 30 and above is considered obese.  Watch levels of cholesterol and blood lipids  You should start having your blood tested for lipids and cholesterol at 61 years of age, then have this test every 5 years.  You may need to have your cholesterol  levels checked more often if: ? Your lipid or cholesterol levels are high. ? You are older than 61 years of age. ? You are at high risk for heart disease.  Cancer screening Lung Cancer  Lung cancer screening is recommended for adults 23-21 years old who are at high risk for lung cancer because of a history of smoking.  A yearly low-dose CT scan of the lungs is recommended for people who: ? Currently smoke. ? Have quit within the past 15 years. ? Have at least a 30-pack-year history of smoking. A pack year is smoking an average of one pack of cigarettes a day for 1 year.  Yearly screening should continue until it has been 15 years since you quit.  Yearly screening should stop if you develop a health problem that would prevent you from having lung cancer treatment.  Breast Cancer  Practice breast self-awareness. This means understanding how your breasts normally appear and feel.  It also means doing regular breast self-exams. Let your health care provider know about any changes, no matter how small.  If you are in your 20s or 30s, you should have a clinical breast exam (CBE) by a health care provider every 1-3 years as part of a regular health exam.  If you are 32 or older, have a CBE every year. Also consider having a breast X-ray (mammogram) every year.  If you have a family history of breast cancer, talk to your health  care provider about genetic screening.  If you are at high risk for breast cancer, talk to your health care provider about having an MRI and a mammogram every year.  Breast cancer gene (BRCA) assessment is recommended for women who have family members with BRCA-related cancers. BRCA-related cancers include: ? Breast. ? Ovarian. ? Tubal. ? Peritoneal cancers.  Results of the assessment will determine the need for genetic counseling and BRCA1 and BRCA2 testing.  Cervical Cancer Your health care provider may recommend that you be screened regularly for cancer of  the pelvic organs (ovaries, uterus, and vagina). This screening involves a pelvic examination, including checking for microscopic changes to the surface of your cervix (Pap test). You may be encouraged to have this screening done every 3 years, beginning at age 61.  For women ages 58-65, health care providers may recommend pelvic exams and Pap testing every 3 years, or they may recommend the Pap and pelvic exam, combined with testing for human papilloma virus (HPV), every 5 years. Some types of HPV increase your risk of cervical cancer. Testing for HPV may also be done on women of any age with unclear Pap test results.  Other health care providers may not recommend any screening for nonpregnant women who are considered low risk for pelvic cancer and who do not have symptoms. Ask your health care provider if a screening pelvic exam is right for you.  If you have had past treatment for cervical cancer or a condition that could lead to cancer, you need Pap tests and screening for cancer for at least 20 years after your treatment. If Pap tests have been discontinued, your risk factors (such as having a new sexual partner) need to be reassessed to determine if screening should resume. Some women have medical problems that increase the chance of getting cervical cancer. In these cases, your health care provider may recommend more frequent screening and Pap tests.  Colorectal Cancer  This type of cancer can be detected and often prevented.  Routine colorectal cancer screening usually begins at 61 years of age and continues through 61 years of age.  Your health care provider may recommend screening at an earlier age if you have risk factors for colon cancer.  Your health care provider may also recommend using home test kits to check for hidden blood in the stool.  A small camera at the end of a tube can be used to examine your colon directly (sigmoidoscopy or colonoscopy). This is done to check for the  earliest forms of colorectal cancer.  Routine screening usually begins at age 54.  Direct examination of the colon should be repeated every 5-10 years through 61 years of age. However, you may need to be screened more often if early forms of precancerous polyps or small growths are found.  Skin Cancer  Check your skin from head to toe regularly.  Tell your health care provider about any new moles or changes in moles, especially if there is a change in a mole's shape or color.  Also tell your health care provider if you have a mole that is larger than the size of a pencil eraser.  Always use sunscreen. Apply sunscreen liberally and repeatedly throughout the day.  Protect yourself by wearing long sleeves, pants, a wide-brimmed hat, and sunglasses whenever you are outside.  Heart disease, diabetes, and high blood pressure  High blood pressure causes heart disease and increases the risk of stroke. High blood pressure is more likely to develop  in: ? People who have blood pressure in the high end of the normal range (130-139/85-89 mm Hg). ? People who are overweight or obese. ? People who are African American.  If you are 70-53 years of age, have your blood pressure checked every 3-5 years. If you are 56 years of age or older, have your blood pressure checked every year. You should have your blood pressure measured twice-once when you are at a hospital or clinic, and once when you are not at a hospital or clinic. Record the average of the two measurements. To check your blood pressure when you are not at a hospital or clinic, you can use: ? An automated blood pressure machine at a pharmacy. ? A home blood pressure monitor.  If you are between 38 years and 28 years old, ask your health care provider if you should take aspirin to prevent strokes.  Have regular diabetes screenings. This involves taking a blood sample to check your fasting blood sugar level. ? If you are at a normal weight and  have a low risk for diabetes, have this test once every three years after 61 years of age. ? If you are overweight and have a high risk for diabetes, consider being tested at a younger age or more often. Preventing infection Hepatitis B  If you have a higher risk for hepatitis B, you should be screened for this virus. You are considered at high risk for hepatitis B if: ? You were born in a country where hepatitis B is common. Ask your health care provider which countries are considered high risk. ? Your parents were born in a high-risk country, and you have not been immunized against hepatitis B (hepatitis B vaccine). ? You have HIV or AIDS. ? You use needles to inject street drugs. ? You live with someone who has hepatitis B. ? You have had sex with someone who has hepatitis B. ? You get hemodialysis treatment. ? You take certain medicines for conditions, including cancer, organ transplantation, and autoimmune conditions.  Hepatitis C  Blood testing is recommended for: ? Everyone born from [birth year] through 1964. ? Anyone with known risk factors for hepatitis C.  Sexually transmitted infections (STIs)  You should be screened for sexually transmitted infections (STIs) including gonorrhea and chlamydia if: ? You are sexually active and are younger than 61 years of age. ? You are older than 61 years of age and your health care provider tells you that you are at risk for this type of infection. ? Your sexual activity has changed since you were last screened and you are at an increased risk for chlamydia or gonorrhea. Ask your health care provider if you are at risk.  If you do not have HIV, but are at risk, it may be recommended that you take a prescription medicine daily to prevent HIV infection. This is called pre-exposure prophylaxis (PrEP). You are considered at risk if: ? You are sexually active and do not regularly use condoms or know the HIV status of your partner(s). ? You take drugs by  injection. ? You are sexually active with a partner who has HIV.  Talk with your health care provider about whether you are at high risk of being infected with HIV. If you choose to begin PrEP, you should first be tested for HIV. You should then be tested every 3 months for as long as you are taking PrEP. Pregnancy  If you are premenopausal and you may become pregnant, ask  your health care provider about preconception counseling.  If you may become pregnant, take 400 to 800 micrograms (mcg) of folic acid every day.  If you want to prevent pregnancy, talk to your health care provider about birth control (contraception). Osteoporosis and menopause  Osteoporosis is a disease in which the bones lose minerals and strength with aging. This can result in serious bone fractures. Your risk for osteoporosis can be identified using a bone density scan.  If you are 19 years of age or older, or if you are at risk for osteoporosis and fractures, ask your health care provider if you should be screened.  Ask your health care provider whether you should take a calcium or vitamin D supplement to lower your risk for osteoporosis.  Menopause may have certain physical symptoms and risks.  Hormone replacement therapy may reduce some of these symptoms and risks. Talk to your health care provider about whether hormone replacement therapy is right for you. Follow these instructions at home:  Schedule regular health, dental, and eye exams.  Stay current with your immunizations.  Do not use any tobacco products including cigarettes, chewing tobacco, or electronic cigarettes.  If you are pregnant, do not drink alcohol.  If you are breastfeeding, limit how much and how often you drink alcohol.  Limit alcohol intake to no more than 1 drink per day for nonpregnant women. One drink equals 12 ounces of beer, 5 ounces of wine, or 1 ounces of hard liquor.  Do not use street drugs.  Do not share needles.  Ask  your health care provider for help if you need support or information about quitting drugs.  Tell your health care provider if you often feel depressed.  Tell your health care provider if you have ever been abused or do not feel safe at home. This information is not intended to replace advice given to you by your health care provider. Make sure you discuss any questions you have with your health care provider. Document Released: 04/08/2011 Document Revised: 02/29/2016 Document Reviewed: 06/27/2015 Elsevier Interactive Patient Education  [employer].

## 2018-02-11 ENCOUNTER — Ambulatory Visit (INDEPENDENT_AMBULATORY_CARE_PROVIDER_SITE_OTHER): Payer: Medicare Other | Admitting: Family Medicine

## 2018-02-11 ENCOUNTER — Ambulatory Visit (INDEPENDENT_AMBULATORY_CARE_PROVIDER_SITE_OTHER): Payer: Federal, State, Local not specified - PPO

## 2018-02-11 ENCOUNTER — Encounter: Payer: Self-pay | Admitting: Family Medicine

## 2018-02-11 DIAGNOSIS — Z1231 Encounter for screening mammogram for malignant neoplasm of breast: Secondary | ICD-10-CM

## 2018-02-11 DIAGNOSIS — M25562 Pain in left knee: Secondary | ICD-10-CM | POA: Diagnosis not present

## 2018-02-11 NOTE — Patient Instructions (Signed)
Thank you for coming in today. You should hear about the MRI soon.  If you do not hear anything let me know.  Recheck with me a few days after the MRI.     Meniscus Tear A meniscus tear is a knee injury in which a piece of the meniscus is torn. The meniscus is a thick, rubbery, wedge-shaped cartilage in the knee. Two menisci are located in each knee. They sit between the upper bone (femur) and lower bone (tibia) that make up the knee joint. Each meniscus acts as a shock absorber for the knee. A torn meniscus is one of the most common types of knee injuries. This injury can range from mild to severe. Surgery may be needed for a severe tear. What are the causes? This injury may be caused by any squatting, twisting, or pivoting movement. Sports-related injuries are the most common cause. These often occur from:  Running and stopping suddenly.  Changing direction.  Being tackled or knocked off your feet.  As people get older, their meniscus gets thinner and weaker. In these people, tears can happen more easily, such as from climbing stairs. What increases the risk? This injury is more likely to happen to:  People who play contact sports.  Males.  People who are 66?61 years of age.  What are the signs or symptoms? Symptoms of this injury include:  Knee pain, especially at the side of the knee joint. You may feel pain when the injury occurs, or you may only hear a pop and feel pain later.  A feeling that your knee is clicking, catching, locking, or giving way.  Not being able to fully bend or extend your knee.  Bruising or swelling in your knee.  How is this diagnosed? This injury may be diagnosed based on your symptoms and a physical exam. The physical exam may include:  Moving your knee in different ways.  Feeling for tenderness.  Listening for a clicking sound.  Checking if your knee locks or catches.  You may also have tests, such as:  X-rays.  MRI.  A procedure  to look inside your knee with a narrow surgical telescope (arthroscopy).  You may be referred to a knee specialist (orthopedic surgeon). How is this treated? Treatment for this injury depends on the severity of the tear. Treatment for a mild tear may include:  Rest.  Medicine to reduce pain and swelling. This is usually a nonsteroidal anti-inflammatory drug (NSAID).  A knee brace or an elastic sleeve or wrap.  Using crutches or a walker to keep weight off your knee and to help you walk.  Exercises to strengthen your knee (physical therapy).  You may need surgery if you have a severe tear or if other treatments are not working. Follow these instructions at home: Managing pain and swelling  Take over-the-counter and prescription medicines only as told by your health care provider.  If directed, apply ice to the injured area: ? Put ice in a plastic bag. ? Place a towel between your skin and the bag. ? Leave the ice on for 20 minutes, 2-3 times per day.  Raise (elevate) the injured area above the level of your heart while you are sitting or lying down. Activity  Do not use the injured limb to support your body weight until your health care provider says that you can. Use crutches or a walker as told by your health care provider.  Return to your normal activities as told by your health  care provider. Ask your health care provider what activities are safe for you.  Perform range-of-motion exercises only as told by your health care provider.  Begin doing exercises to strengthen your knee and leg muscles only as told by your health care provider. After you recover, your health care provider may recommend these exercises to help prevent another injury. General instructions  Use a knee brace or elastic wrap as told by your health care provider.  Keep all follow-up visits as told by your health care provider. This is important. Contact a health care provider if:  You have a  fever.  Your knee becomes red, tender, or swollen.  Your pain medicine is not helping.  Your symptoms get worse or do not improve after 2 weeks of home care. This information is not intended to replace advice given to you by your health care provider. Make sure you discuss any questions you have with your health care provider. Document Released: 12/14/2002 Document Revised: 02/29/2016 Document Reviewed: 01/16/2015 Elsevier Interactive Patient Education  [employer].

## 2018-02-11 NOTE — Telephone Encounter (Signed)
Patient has been approved for [facility] and the patients part is around $600.

## 2018-02-11 NOTE — Progress Notes (Signed)
[name] is a 62 y.o. female who presents to [facility] today for follow-up left knee pain.  [name] returns to clinic complaining of left knee pain.  She was last seen for this issue September 23, 2017.  At that time she had a steroid injection which helped but only lasted for a few weeks.  We discussed options and agreed to proceed with a trial of Orthovisc.  She notes that it would cost her $500 which she would like to avoid.  She notes persistent medial knee pain worse with activity better with rest.  She notes pain is worse when she has to climb stairs.  She continues to use diclofenac gel which does help.  She notes the pain is throbbing and she also notes sometimes her knee swells.  She does note clicking and popping and occasional catching in her knee as well.   Past Medical History:  Diagnosis Date  . Bipolar disorder ([place])   . CHF (congestive heart failure) ([place])   . Chronic diastolic heart failure (HCC)    hospitalized at [facility]  . Complication of anesthesia    had "breathing problems and ended up in ICU" 03/2010 report on chart  . DDD (degenerative disc disease), lumbar   . 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 GERD (gastroesophageal reflux disease)   . Glucose intolerance (impaired glucose tolerance)   . HLD (hyperlipidemia)    nec/nos  . HTN (hypertension)    benign essential  . MVP (mitral valve prolapse)   . Narcolepsy   . Suicide attempt ([place])    hx  . Syncope    admx to [facility] in 2/12: Echo with normal LVF; head CT unremarkable, ECG stable, no further w/u  . Ulcer    Past Surgical History:  Procedure Laterality Date  . AUGMENTATION MAMMAPLASTY    . BLADDER SUSPENSION    . GASTRIC BYPASS    . LUMBAR LAMINECTOMY/DECOMPRESSION MICRODISCECTOMY  09/11/2011   Procedure: LUMBAR LAMINECTOMY/DECOMPRESSION MICRODISCECTOMY;  Surgeon: Tobi Bastos;  Location: [facility];  Service: Orthopedics;  Laterality:  Right;  Hemi Laminectomy/Microdiscectomy L5 - S1 on the Right (X-Ray)  . PARTIAL HYSTERECTOMY    . REPAIR PERONEAL TENDONS ANKLE     Social History   Tobacco Use  . Smoking status: Former Smoker    Last attempt to quit: 10/08/2003    Years since quitting: 14.3  . Smokeless tobacco: Never Used  Substance Use Topics  . Alcohol use: Yes    Comment: rare     ROS:  As above   Medications: Current Outpatient Medications  Medication Sig Dispense Refill  . amLODipine-olmesartan (AZOR) 10-40 MG tablet TAKE ONE TABLET BY MOUTH EVERY DAY 30 tablet 11  . atorvastatin (LIPITOR) 80 MG tablet Take 80 mg by mouth at bedtime.    . busPIRone (BUSPAR) 5 MG tablet Take 1 tablet (5 mg total) by mouth 2 (two) times daily. 60 tablet 0  . cloNIDine (CATAPRES) 0.2 MG tablet TAKE TWO TABLETS BY MOUTH 3 TIMES A DAY 180 tablet 11  . Diclofenac Sodium 3 % GEL Place onto the skin.    . DULoxetine (CYMBALTA) 60 MG capsule Take 1 capsule (60 mg total) by mouth every evening. LAST REFILL. [employer]. PLEASE CALL OFFICE AND SCHEDULE AN APPOINTMENT. 30 capsule 0  . EQ ASPIRIN 325 MG tablet Take 325 mg by mouth daily.    [name] esomeprazole (NEXIUM) 40 MG  capsule Take 40 mg by mouth 2 (two) times daily as needed. 1 capsule daily scheduled, will take an additional capsule if needed    . folic acid (FOLVITE) 1 MG tablet Take 1 mg by mouth daily.     . furosemide (LASIX) 20 MG tablet Take 1 tablet (20 mg total) by mouth daily. 30 tablet 0  . gabapentin (NEURONTIN) 300 MG capsule Take 3 capsules (900 mg total) by mouth 3 (three) times daily. 270 capsule 11  . hydrALAZINE (APRESOLINE) 100 MG tablet TAKE ONE TABLET BY MOUTH 3 TIMES A DAY 90 tablet 11  . labetalol (NORMODYNE) 300 MG tablet Take 1 tablet (300 mg total) by mouth 2 (two) times daily. NEED OV. 15 tablet 0  . lidocaine (XYLOCAINE) 5 % ointment Apply 1 application topically as needed.    . Multiple Vitamins-Minerals (MULTIVITAMINS THER. W/MINERALS) TABS Take  1 tablet by mouth daily.      [name] spironolactone (ALDACTONE) 50 MG tablet TAKE ONE TABLET BY MOUTH EVERY DAY 30 tablet 11  . VENTOLIN HFA 108 (90 BASE) MCG/ACT inhaler prn     No current facility-administered medications for this visit.    Allergies  Allergen Reactions  . Codeine Nausea And Vomiting     Exam:  BP (!) 150/96   Pulse 82   Ht 5\' 6"  (1.676 m)   Wt 200 lb (90.7 kg)   BMI 32.28 kg/m  General: Well Developed, well nourished, and in no acute distress.  Neuro/Psych: Alert and oriented x3, extra-ocular muscles intact, able to move all 4 extremities, sensation grossly intact. Skin: Warm and dry, no rashes noted.  Respiratory: Not using accessory muscles, speaking in full sentences, trachea midline.  Cardiovascular: Pulses palpable, no extremity edema. Abdomen: Does not appear distended. MSK: Left knee: Mild effusion no erythema. Range of motion 5-120 degrees with retropatellar crepitations. Tender to palpation medial joint line. Stable ligamentous exam. Positive medial McMurray's test.  Negative lateral. Intact flexion and extension strength.  Procedure: Real-time Ultrasound Guided Injection of left knee  Device: GE Logiq E   Images permanently stored and available for review in the ultrasound unit. Verbal informed consent obtained.  Discussed risks and benefits of procedure. Warned about infection bleeding damage to structures skin hypopigmentation and fat atrophy among others. Patient expresses understanding and agreement Time-out conducted.   Noted no overlying erythema, induration, or other signs of local infection.   Skin prepped in a sterile fashion.   Local anesthesia: Topical Ethyl chloride.   With sterile technique and under real time ultrasound guidance:  80mg  kenalog and 57ml marcaine injected easily.   Completed without difficulty   Pain immediately resolved suggesting accurate placement of the medication.   Advised to call if fevers/chills, erythema,  induration, drainage, or persistent bleeding.   Images permanently stored and available for review in the ultrasound unit.  Impression: Technically successful ultrasound guided injection.   EXAM: LEFT KNEE - COMPLETE 4+ VIEW  COMPARISON:  None.  FINDINGS: No fracture or malalignment. Mild-to-moderate degenerative change of the medial compartment. No large knee effusion.  IMPRESSION: Mild degenerative changes.  No acute osseous abnormality   Electronically Signed   By: Donavan Foil M.D.   On: 09/23/2017 22:44  I personally (independently) visualized and performed the interpretation of the images attached in this note.     Assessment and Plan: 62 y.o. female with  Left knee pain: Patient only has mild DJD seen on x-ray.  Her pain and symptoms are worse than I would  expect for the x-ray.  I think it is likely that she has a degenerative meniscus tear that may be interfering with her knee function as she does describe some mechanical symptoms.  Additionally she may have a larger portion of chondromalacia that is just not visible on x-ray.  We discussed options.  I think is reasonable to proceed with MRI at this point as she may benefit from surgical debridement of a meniscus tear.  Follow-up after MRI.  Patient notes that her pain is substantial at times.  Proceed with a intra-articular steroid injection as noted above.  This will is provide several weeks of pain relief that may get her to surgery if that is the direction this is heading.    Orders Placed This Encounter  Procedures  . MR Knee Left  Wo Contrast    Standing Status:   Future    Standing Expiration Date:   04/14/2019    Order Specific Question:   What is the patient's sedation requirement?    Answer:   No Sedation    Order Specific Question:   Does the patient have a pacemaker or implanted devices?    Answer:   No    Order Specific Question:   Preferred imaging location?    Answer:   Product/process development scientist  (table limit-350lbs)    Order Specific Question:   Radiology Contrast Protocol - do NOT remove file path    Answer:   \\[email or web address]   No orders of the defined types were placed in this encounter.   Discussed warning signs or symptoms. Please see discharge instructions. Patient expresses understanding.

## 2018-02-12 ENCOUNTER — Other Ambulatory Visit: Payer: Self-pay | Admitting: *Deleted

## 2018-02-12 DIAGNOSIS — R790 Abnormal level of blood mineral: Secondary | ICD-10-CM

## 2018-02-12 LAB — LIPID PANEL
CHOL/HDL RATIO: 3.6 (calc) (ref <5.0)
Cholesterol: 191 mg/dL (ref <200)
HDL: 53 mg/dL (ref >50)
LDL CHOLESTEROL (CALC): 121 mg/dL — AB
Non-HDL Cholesterol (Calc): 138 mg/dL (calc) — ABNORMAL HIGH (ref <130)
TRIGLYCERIDES: 80 mg/dL (ref <150)

## 2018-02-12 LAB — CBC
HCT: 41.5 % (ref 35.0–45.0)
Hemoglobin: 14 g/dL (ref 11.7–15.5)
MCH: 27.7 pg (ref 27.0–33.0)
MCHC: 33.7 g/dL (ref 32.0–36.0)
MCV: 82.2 fL (ref 80.0–100.0)
MPV: 11.4 fL (ref 7.5–12.5)
PLATELETS: 254 10*3/uL (ref 140–400)
RBC: 5.05 10*6/uL (ref 3.80–5.10)
RDW: 13.1 % (ref 11.0–15.0)
WBC: 8.9 10*3/uL (ref 3.8–10.8)

## 2018-02-12 LAB — COMPLETE METABOLIC PANEL WITH GFR
AG Ratio: 1.5 (calc) (ref 1.0–2.5)
ALBUMIN MSPROF: 4.4 g/dL (ref 3.6–5.1)
ALT: 11 U/L (ref 6–29)
AST: 17 U/L (ref 10–35)
Alkaline phosphatase (APISO): 102 U/L (ref 33–130)
BUN: 12 mg/dL (ref 7–25)
CALCIUM: 9.6 mg/dL (ref 8.6–10.4)
CO2: 27 mmol/L (ref 20–32)
CREATININE: 0.87 mg/dL (ref 0.50–0.99)
Chloride: 104 mmol/L (ref 98–110)
GFR, EST AFRICAN AMERICAN: 84 mL/min/{1.73_m2} (ref >=60)
GFR, EST NON AFRICAN AMERICAN: 72 mL/min/{1.73_m2} (ref >=60)
GLUCOSE: 73 mg/dL (ref 65–99)
Globulin: 2.9 g/dL (calc) (ref 1.9–3.7)
Potassium: 4.4 mmol/L (ref 3.5–5.3)
Sodium: 141 mmol/L (ref 135–146)
Total Bilirubin: 0.6 mg/dL (ref 0.2–1.2)
Total Protein: 7.3 g/dL (ref 6.1–8.1)

## 2018-02-12 LAB — FERRITIN: Ferritin: 11 ng/mL — ABNORMAL LOW (ref 20–288)

## 2018-02-17 ENCOUNTER — Ambulatory Visit (INDEPENDENT_AMBULATORY_CARE_PROVIDER_SITE_OTHER): Payer: Medicare Other | Admitting: Family Medicine

## 2018-02-17 VITALS — BP 126/77 | HR 73

## 2018-02-17 DIAGNOSIS — I10 Essential (primary) hypertension: Secondary | ICD-10-CM

## 2018-02-17 NOTE — Progress Notes (Signed)
Vitals:   02/17/18 1434  BP: 126/77  Pulse: 73   BP well controlled.

## 2018-02-17 NOTE — Progress Notes (Signed)
Pt came into clinic today for BP check. At last OV it was elevated. Pt reports her BP is high sometimes when she comes to the Dr, but today she was at goal. Pt states she did take her Rx's today as prescribed. Pt advised to continue taking Rx's as written, and to follow up with PCP as directed at last OV. Verbalized understanding. No further questions.   Pt also states she was contacted about scheduling her colonoscopy. She was concerned they reused same equipment. Went over with Pt they do not reuse anything that cannot be properly sterilized. She will contact office to schedule.

## 2018-02-18 ENCOUNTER — Ambulatory Visit: Payer: Federal, State, Local not specified - PPO

## 2018-02-20 ENCOUNTER — Other Ambulatory Visit: Payer: Self-pay | Admitting: Cardiology

## 2018-02-20 NOTE — Telephone Encounter (Signed)
Rx request sent to pharmacy.  

## 2018-02-23 ENCOUNTER — Ambulatory Visit (INDEPENDENT_AMBULATORY_CARE_PROVIDER_SITE_OTHER): Payer: Federal, State, Local not specified - PPO

## 2018-02-23 DIAGNOSIS — M23222 Derangement of posterior horn of medial meniscus due to old tear or injury, left knee: Secondary | ICD-10-CM | POA: Diagnosis not present

## 2018-02-23 DIAGNOSIS — M23201 Derangement of unspecified lateral meniscus due to old tear or injury, left knee: Secondary | ICD-10-CM

## 2018-02-23 DIAGNOSIS — M25562 Pain in left knee: Secondary | ICD-10-CM

## 2018-02-24 ENCOUNTER — Other Ambulatory Visit: Payer: Self-pay | Admitting: Cardiology

## 2018-02-24 NOTE — Telephone Encounter (Signed)
REFILL 

## 2018-03-04 ENCOUNTER — Other Ambulatory Visit: Payer: Self-pay | Admitting: Cardiology

## 2018-03-04 NOTE — Telephone Encounter (Signed)
Rx sent to pharmacy   

## 2018-03-10 ENCOUNTER — Encounter: Payer: Self-pay | Admitting: Family Medicine

## 2018-03-10 ENCOUNTER — Ambulatory Visit (INDEPENDENT_AMBULATORY_CARE_PROVIDER_SITE_OTHER): Payer: Medicare Other | Admitting: Family Medicine

## 2018-03-10 VITALS — BP 136/65 | HR 97 | Ht 66.0 in | Wt 201.0 lb

## 2018-03-10 DIAGNOSIS — R05 Cough: Secondary | ICD-10-CM

## 2018-03-10 DIAGNOSIS — R053 Chronic cough: Secondary | ICD-10-CM

## 2018-03-10 DIAGNOSIS — Z636 Dependent relative needing care at home: Secondary | ICD-10-CM | POA: Diagnosis not present

## 2018-03-10 DIAGNOSIS — R0602 Shortness of breath: Secondary | ICD-10-CM | POA: Diagnosis not present

## 2018-03-10 MED ORDER — DULOXETINE HCL 60 MG PO CPEP: 60.0000 mg | ORAL_CAPSULE | Freq: Every evening | ORAL | 1 refills | Status: DC

## 2018-03-10 MED ORDER — BUSPIRONE HCL 10 MG PO TABS: 10.0000 mg | ORAL_TABLET | Freq: Two times a day (BID) | ORAL | 2 refills | Status: DC

## 2018-03-10 NOTE — Patient Instructions (Signed)
With a chronic cough I would like to get a chest x-ray since there were couple of abnormalities such as chronic bronchitis seen on the chest x-ray that she had done in April.  It also like her to schedule spirometry in the next couple weeks.

## 2018-03-10 NOTE — Progress Notes (Signed)
Subjective:    Patient ID: [name], female    DOB: Feb 25, 1956, 62 y.o.   MRN: [id number]  HPI 60-year-old female is here today for one-month follow-up for increased stress/caregiver burden.  When I last saw her we decided to do a trial of buspirone 5 mg twice a day to see if she felt like this was helping.  I also encouraged her to contact the [facility] to see if she can getting additional services for her husband since he is a English as a second language teacher.  He may affect qualify for personal care services.  Really is just been frustrating.  She and her husband just bought a new RV to to travel with as soon as they retired and now his Lewy body dementia and Parkinson's have progressed so quickly that he cannot travel.  He also reports a chronic cough.  She feels like it is coming from a little deeper in her chest.  Most the time is dry but occasionally can be more wet.  She feels like things like laughing and talking will trigger the cough.  It is really not worse at night.  She actually takes Nexium daily for reflux symptoms.  Has a history of congestive heart failure and a former smoker.  She just had a chest x-ray done in April which suggested some low-grade compensated CHF.  As well as may be an element of chronic bronchitis.  Review of Systems   BP 136/65   Pulse 97   Ht 5\' 6"  (1.676 m)   Wt 201 lb (91.2 kg)   SpO2 100%   BMI 32.44 kg/m     Allergies  Allergen Reactions  . Codeine Nausea And Vomiting    Past Medical History:  Diagnosis Date  . Bipolar disorder ([place])   . CHF (congestive heart failure) ([place])   . Chronic diastolic heart failure (HCC)    hospitalized at [facility]  . Complication of anesthesia    had "breathing problems and ended up in ICU" 03/2010 report on chart  . DDD (degenerative disc disease), lumbar   . 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 GERD (gastroesophageal reflux disease)   . Glucose intolerance (impaired glucose tolerance)   . HLD (hyperlipidemia)    nec/nos   . HTN (hypertension)    benign essential  . MVP (mitral valve prolapse)   . Narcolepsy   . Suicide attempt ([place])    hx  . Syncope    admx to [facility] in 2/12: Echo with normal LVF; head CT unremarkable, ECG stable, no further w/u  . Ulcer     Past Surgical History:  Procedure Laterality Date  . AUGMENTATION MAMMAPLASTY    . BLADDER SUSPENSION    . GASTRIC BYPASS    . LUMBAR LAMINECTOMY/DECOMPRESSION MICRODISCECTOMY  09/11/2011   Procedure: LUMBAR LAMINECTOMY/DECOMPRESSION MICRODISCECTOMY;  Surgeon: Tobi Bastos;  Location: [facility];  Service: Orthopedics;  Laterality: Right;  Hemi Laminectomy/Microdiscectomy L5 - S1 on the Right (X-Ray)  . PARTIAL HYSTERECTOMY    . REPAIR PERONEAL TENDONS ANKLE      Social History   Socioeconomic History  . Marital status: Married    Spouse name: Not on file  . Number of children: Not on file  . Years of education: Not on file  . Highest education level: Not on file  Occupational History  . Not on file  Social Needs  . Financial resource strain: Not on file  . Food insecurity:    Worry:  Not on file    Inability: Not on file  . Transportation needs:    Medical: Not on file    Non-medical: Not on file  Tobacco Use  . Smoking status: Former Smoker    Last attempt to quit: 10/08/2003    Years since quitting: 14.4  . Smokeless tobacco: Never Used  Substance and Sexual Activity  . Alcohol use: Yes    Comment: rare  . Drug use: No  . Sexual activity: Not on file  Lifestyle  . Physical activity:    Days per week: Not on file    Minutes per session: Not on file  . Stress: Not on file  Relationships  . Social connections:    Talks on phone: Not on file    Gets together: Not on file    Attends religious service: Not on file    Active member of club or organization: Not on file    Attends meetings of clubs or organizations: Not on file    Relationship status: Not on file  . Intimate partner violence:    Fear of current or ex  partner: Not on file    Emotionally abused: Not on file    Physically abused: Not on file    Forced sexual activity: Not on file  Other Topics Concern  . Not on file  Social History Narrative   Regularly exercises.     Family History  Problem Relation Age of Onset  . Heart attack Father 64  . Hypertension Father   . Diabetes Mother   . Hypertension Mother   . Stroke Other        grandmother    Outpatient Encounter Medications as of 03/10/2018  Medication Sig  . amLODipine-olmesartan (AZOR) 10-40 MG tablet TAKE ONE TABLET BY MOUTH EVERY DAY  . atorvastatin (LIPITOR) 80 MG tablet Take 80 mg by mouth at bedtime.  . busPIRone (BUSPAR) 10 MG tablet Take 1 tablet (10 mg total) by mouth 2 (two) times daily.  . cloNIDine (CATAPRES) 0.2 MG tablet Take two tablets by mouth three times daily. Call the office to make appointment for further refills.  . Diclofenac Sodium 3 % GEL Place onto the skin.  . DULoxetine (CYMBALTA) 60 MG capsule Take 1 capsule (60 mg total) by mouth every evening.  [name] EQ ASPIRIN 325 MG tablet Take 325 mg by mouth daily.  [name] esomeprazole (NEXIUM) 40 MG capsule Take 40 mg by mouth 2 (two) times daily as needed. 1 capsule daily scheduled, will take an additional capsule if needed  . folic acid (FOLVITE) 1 MG tablet Take 1 mg by mouth daily.   . furosemide (LASIX) 20 MG tablet Take 1 tablet (20 mg total) by mouth daily.  [name] gabapentin (NEURONTIN) 300 MG capsule Take 3 capsules (900 mg total) by mouth 3 (three) times daily.  . hydrALAZINE (APRESOLINE) 100 MG tablet TAKE ONE TABLET BY MOUTH 3 TIMES A DAY  . labetalol (NORMODYNE) 300 MG tablet Take 1 tablet (300 mg total) by mouth 2 (two) times daily. Please schedule appointment for refills.  . lidocaine (XYLOCAINE) 5 % ointment Apply 1 application topically as needed.  . Multiple Vitamins-Minerals (MULTIVITAMINS THER. W/MINERALS) TABS Take 1 tablet by mouth daily.    [name] spironolactone (ALDACTONE) 50 MG tablet Take 1 tablet (50  mg total) by mouth daily. NEED OV.  . VENTOLIN HFA 108 (90 BASE) MCG/ACT inhaler prn  . [DISCONTINUED] busPIRone (BUSPAR) 5 MG tablet Take 1 tablet (5 mg total) by  mouth 2 (two) times daily.  . [DISCONTINUED] DULoxetine (CYMBALTA) 60 MG capsule Take 1 capsule (60 mg total) by mouth every evening. LAST REFILL. [place]. PLEASE CALL OFFICE AND SCHEDULE AN APPOINTMENT.   No facility-administered encounter medications on file as of 03/10/2018.          Objective:   Physical Exam  Constitutional: She is oriented to person, place, and time. She appears well-developed and well-nourished.  HENT:  Head: Normocephalic and atraumatic.  Cardiovascular: Normal rate, regular rhythm and normal heart sounds.  Pulmonary/Chest: Effort normal and breath sounds normal.  Neurological: She is alert and oriented to person, place, and time.  Skin: Skin is warm and dry.  Psychiatric: She has a normal mood and affect. Her behavior is normal.        Assessment & Plan:  Caregiver burden/increased stress-we will increase her buspirone to 10 mg twice a day.F/U in 2 months.    Chronic cough-recommend chest CT for further evaluation.  She did have some elements of chronic bronchitis and low compensated CHF on chest x-ray back in April. Will schedule for spirometry in 2 weeks.

## 2018-03-13 ENCOUNTER — Encounter: Payer: Self-pay | Admitting: Family Medicine

## 2018-03-16 ENCOUNTER — Ambulatory Visit: Payer: Federal, State, Local not specified - PPO | Admitting: Family Medicine

## 2018-03-23 ENCOUNTER — Other Ambulatory Visit: Payer: Self-pay | Admitting: Cardiovascular Disease

## 2018-03-23 ENCOUNTER — Other Ambulatory Visit: Payer: Self-pay | Admitting: Cardiology

## 2018-03-27 ENCOUNTER — Emergency Department (INDEPENDENT_AMBULATORY_CARE_PROVIDER_SITE_OTHER)
Admission: EM | Admit: 2018-03-27 | Discharge: 2018-03-27 | Disposition: A | Discharge status: Home / Self Care | Payer: Medicare Other | Source: Home / Self Care | Attending: Family Medicine | Admitting: Family Medicine

## 2018-03-27 ENCOUNTER — Emergency Department (INDEPENDENT_AMBULATORY_CARE_PROVIDER_SITE_OTHER): Payer: Federal, State, Local not specified - PPO

## 2018-03-27 DIAGNOSIS — M7989 Other specified soft tissue disorders: Secondary | ICD-10-CM | POA: Diagnosis not present

## 2018-03-27 DIAGNOSIS — X58XXXA Exposure to other specified factors, initial encounter: Secondary | ICD-10-CM | POA: Diagnosis not present

## 2018-03-27 DIAGNOSIS — S92524A Nondisplaced fracture of medial phalanx of right lesser toe(s), initial encounter for closed fracture: Secondary | ICD-10-CM | POA: Diagnosis not present

## 2018-03-27 DIAGNOSIS — S92501A Displaced unspecified fracture of right lesser toe(s), initial encounter for closed fracture: Secondary | ICD-10-CM

## 2018-03-27 DIAGNOSIS — S99921A Unspecified injury of right foot, initial encounter: Secondary | ICD-10-CM | POA: Diagnosis not present

## 2018-03-27 DIAGNOSIS — M79671 Pain in right foot: Secondary | ICD-10-CM | POA: Diagnosis not present

## 2018-03-27 MED ORDER — HYDROCODONE-ACETAMINOPHEN 5-325 MG PO TABS: ORAL_TABLET | ORAL | 0 refills | Status: DC

## 2018-03-27 NOTE — ED Provider Notes (Signed)
[facility]    CSN: [id number] Arrival date & time: 03/27/18  1746     History   Chief Complaint Chief Complaint  Patient presents with  . Toe Injury    HPI [name] is a 62 y.o. female.   8 days ago patient bumped her right 5th toe on a bedpost, resulting in immediate pain/swelling that has persisted.  The history is provided by the patient.  Toe Pain  This is a new problem. Episode onset: 8 days ago. The problem occurs constantly. The problem has not changed since onset.The symptoms are aggravated by walking. Nothing relieves the symptoms. Treatments tried: buddy taping. The treatment provided no relief.    Past Medical History:  Diagnosis Date  . Bipolar disorder ([place])   . CHF (congestive heart failure) ([place])   . Chronic diastolic heart failure (HCC)    hospitalized at [facility] regional  . Complication of anesthesia    had "breathing problems and ended up in ICU" 03/2010 report on chart  . DDD (degenerative disc disease), lumbar   . 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 GERD (gastroesophageal reflux disease)   . Glucose intolerance (impaired glucose tolerance)   . HLD (hyperlipidemia)    nec/nos  . HTN (hypertension)    benign essential  . MVP (mitral valve prolapse)   . Narcolepsy   . Suicide attempt ([place])    hx  . Syncope    admx to [facility] in 2/12: Echo with normal LVF; head CT unremarkable, ECG stable, no further w/u  . Ulcer     Patient Active Problem List   Diagnosis Date Noted  . Caregiver burden 02/10/2018  . Mitral valve prolapse syndrome 08/15/2016  . Nerve compression 08/15/2016  . Congestive heart failure ([place]) 08/15/2016  . Right cervical radiculopathy 08/09/2016  . Acute ischemic right MCA stroke ([place]) 07/31/2016  . Essential hypertension, malignant 06/12/2016  . History of CVA (cerebrovascular accident) 03/13/2016  . 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 Narcolepsy 03/13/2013  . Coronary atherosclerosis  03/13/2013  . Syncope and collapse 08/08/2011  . DDD (degenerative disc disease), lumbar   . HIRSUTISM 06/02/2009  . BIPOLAR DISORDER UNSPECIFIED 05/17/2009  . DYSPNEA 05/17/2009  . SLEEP DISORDER 08/31/2008  . Pain in limb 08/03/2008  . Abnormal blood sugar 12/22/2007  . Hyperlipidemia 07/15/2007  . FATIGUE 07/15/2007    Past Surgical History:  Procedure Laterality Date  . AUGMENTATION MAMMAPLASTY    . BLADDER SUSPENSION    . GASTRIC BYPASS    . LUMBAR LAMINECTOMY/DECOMPRESSION MICRODISCECTOMY  09/11/2011   Procedure: LUMBAR LAMINECTOMY/DECOMPRESSION MICRODISCECTOMY;  Surgeon: Tobi Bastos;  Location: [facility];  Service: Orthopedics;  Laterality: Right;  Hemi Laminectomy/Microdiscectomy L5 - S1 on the Right (X-Ray)  . PARTIAL HYSTERECTOMY    . REPAIR PERONEAL TENDONS ANKLE      OB History   None      Home Medications    Prior to Admission medications   Medication Sig Start Date End Date Taking? Authorizing Provider  amLODipine-olmesartan (AZOR) 10-40 MG tablet TAKE ONE TABLET BY MOUTH EVERY DAY 11/17/17   Lelon Perla, MD  atorvastatin (LIPITOR) 80 MG tablet Take 80 mg by mouth at bedtime. 08/01/16   [provider]  busPIRone (BUSPAR) 10 MG tablet Take 1 tablet (10 mg total) by mouth 2 (two) times daily. 03/10/18   Hali Marry, MD  cloNIDine (CATAPRES) 0.2 MG tablet TAKE TWO TABLETS BY MOUTH 3 TIMES  A DAY 03/23/18   Lelon Perla, MD  Diclofenac Sodium 3 % GEL Place onto the skin.    [provider]  DULoxetine (CYMBALTA) 60 MG capsule Take 1 capsule (60 mg total) by mouth every evening. 03/10/18   Hali Marry, MD  EQ ASPIRIN 325 MG tablet Take 325 mg by mouth daily. 08/01/16   [provider]  esomeprazole (NEXIUM) 40 MG capsule Take 40 mg by mouth 2 (two) times daily as needed. 1 capsule daily scheduled, will take an additional capsule if needed    [provider]  folic acid (FOLVITE) 1 MG tablet Take 1 mg by  mouth daily.  08/19/16   [provider]  furosemide (LASIX) 20 MG tablet Take 1 tablet (20 mg total) by mouth daily. 02/10/18   Hali Marry, MD  gabapentin (NEURONTIN) 300 MG capsule Take 3 capsules (900 mg total) by mouth 3 (three) times daily. 06/16/17   [name], MD  hydrALAZINE (APRESOLINE) 100 MG tablet TAKE ONE TABLET BY MOUTH 3 TIMES A DAY 09/18/17   Hali Marry, MD  HYDROcodone-acetaminophen (NORCO/VICODIN) 5-325 MG tablet Take one by mouth at bedtime as needed for pain 03/27/18   Kandra Nicolas, MD  labetalol (NORMODYNE) 300 MG tablet TAKE ONE TABLET BY MOUTH TWICE DAILY 03/23/18   Lorretta Harp, MD  lidocaine (XYLOCAINE) 5 % ointment Apply 1 application topically as needed.    [provider]  Multiple Vitamins-Minerals (MULTIVITAMINS THER. W/MINERALS) TABS Take 1 tablet by mouth daily.      [provider]  spironolactone (ALDACTONE) 50 MG tablet TAKE ONE TABLET BY MOUTH EVERY DAY 03/23/18   Lelon Perla, MD  VENTOLIN HFA 108 (90 BASE) MCG/ACT inhaler prn 11/10/11   [provider]    Family History Family History  Problem Relation Age of Onset  . Heart attack Father 13  . Hypertension Father   . Diabetes Mother   . Hypertension Mother   . Stroke Other        grandmother    Social History Social History   Tobacco Use  . Smoking status: Former Smoker    Last attempt to quit: 10/08/2003    Years since quitting: 14.4  . Smokeless tobacco: Never Used  Substance Use Topics  . Alcohol use: Yes    Comment: rare  . Drug use: No     Allergies   Codeine   Review of Systems Review of Systems  All other systems reviewed and are negative.    Physical Exam Triage Vital Signs ED Triage Vitals  Enc Vitals Group     BP 03/27/18 1809 (!) 174/112     Pulse Rate 03/27/18 1809 88     Resp 03/27/18 1809 18     Temp 03/27/18 1809 98.6 F (37 C)     Temp Source 03/27/18 1809 Oral     SpO2 03/27/18 1809 99 %      Weight 03/27/18 1811 207 lb (93.9 kg)     Height 03/27/18 1811 5\' 6"  (1.676 m)     Head Circumference --      Peak Flow --      Pain Score 03/27/18 1811 9     Pain Loc --      Pain Edu? --      Excl. in [place]? --    No data found.  Updated Vital Signs BP (!) 174/112 (BP Location: Right Arm)   Pulse 88   Temp  98.6 F (37 C) (Oral)   Resp 18   Ht 5\' 6"  (1.676 m)   Wt 207 lb (93.9 kg)   SpO2 99%   BMI 33.41 kg/m   Visual Acuity Right Eye Distance:   Left Eye Distance:   Bilateral Distance:    Right Eye Near:   Left Eye Near:    Bilateral Near:     Physical Exam  Constitutional: She appears well-developed and well-nourished. No distress.  HENT:  Head: Normocephalic.  Eyes: Pupils are equal, round, and reactive to light.  Cardiovascular: Normal rate.  Pulmonary/Chest: Effort normal.  Musculoskeletal:       Right foot: There is decreased range of motion, tenderness, bony tenderness, swelling and decreased capillary refill. There is no crepitus, no deformity and no laceration.       Feet:  Mild swelling and tenderness right 5th toe extending to distal 5th metatarsal.  Neurological: She is alert.  Skin: Skin is warm and dry.  Nursing note and vitals reviewed.    UC Treatments / Results  Labs (all labs ordered are listed, but only abnormal results are displayed) Labs Reviewed - No data to display  EKG None  Radiology Dg Foot Complete Right  Result Date: 03/27/2018 CLINICAL DATA:  Pt hit toe on end of bed post 1 week ago and still having pain to 5th digit. EXAM: RIGHT FOOT COMPLETE - 3+ VIEW COMPARISON:  None. FINDINGS: There is a subtle nondisplaced fracture crossing the middle phalanx of the fifth toe. There is developmental fusion at the DIP joint. No other evidence of a fracture.  Joints are normally aligned. Small dorsal and plantar calcaneal spurs. Mild soft tissue swelling is noted of the fifth toe. IMPRESSION: Subtle nondisplaced fracture of the middle  phalanx of the right fifth toe. Electronically Signed   By: Lajean Manes M.D.   On: 03/27/2018 18:32    Procedures Procedures (including critical care time)  Medications Ordered in UC Medications - No data to display  Initial Impression / Assessment and Plan / UC Course  I have reviewed the triage vital signs and the nursing notes.  Pertinent labs & imaging results that were available during my care of the patient were reviewed by me and considered in my medical decision making (see chart for details).     Toe strapped using "Buddy Tape" technique.  Dispensed post-op shoe. Rx for Lortab at bedtime. Controlled Substance Prescriptions I have consulted the [place] Controlled Substances Registry for this patient, and feel the risk/benefit ratio today is favorable for proceeding with this prescription for a controlled substance.  Patient's BP elevated today, probably from pain.  Note BP reading 136/65 during office visit 03/10/18.  Followup with Dr. Aundria Mems or Dr. [name] ([facility]) if not improving about two weeks.    Final Clinical Impressions(s) / UC Diagnoses   Final diagnoses:  Fracture of fifth toe, right, closed, initial encounter     Discharge Instructions     Buddy tape toe.  Wear post-op shoe.      ED Prescriptions    Medication Sig Dispense Auth. Provider   HYDROcodone-acetaminophen (NORCO/VICODIN) 5-325 MG tablet Take one by mouth at bedtime as needed for pain 5 tablet Kandra Nicolas, MD        Kandra Nicolas, MD 03/30/18 1440

## 2018-03-27 NOTE — ED Triage Notes (Signed)
Pt stubbed toe on bedpost 1 week ago and still having significant.

## 2018-03-27 NOTE — Discharge Instructions (Signed)
Buddy tape toe.  Wear post-op shoe.

## 2018-04-04 ENCOUNTER — Other Ambulatory Visit: Payer: Self-pay | Admitting: Family Medicine

## 2018-04-06 ENCOUNTER — Other Ambulatory Visit: Payer: Self-pay | Admitting: *Deleted

## 2018-04-09 ENCOUNTER — Other Ambulatory Visit: Payer: Self-pay | Admitting: Sports Medicine

## 2018-04-09 DIAGNOSIS — M5416 Radiculopathy, lumbar region: Secondary | ICD-10-CM

## 2018-04-20 ENCOUNTER — Other Ambulatory Visit: Payer: Self-pay | Admitting: Cardiology

## 2018-04-20 NOTE — Telephone Encounter (Signed)
Rx sent to pharmacy   

## 2018-05-04 ENCOUNTER — Other Ambulatory Visit: Payer: Self-pay | Admitting: Cardiology

## 2018-05-05 NOTE — Telephone Encounter (Signed)
Rx request sent to pharmacy.  

## 2018-05-12 ENCOUNTER — Other Ambulatory Visit: Payer: Self-pay | Admitting: Family Medicine

## 2018-05-13 DIAGNOSIS — R0602 Shortness of breath: Secondary | ICD-10-CM | POA: Diagnosis not present

## 2018-05-13 DIAGNOSIS — R05 Cough: Secondary | ICD-10-CM | POA: Diagnosis not present

## 2018-05-13 DIAGNOSIS — J3489 Other specified disorders of nose and nasal sinuses: Secondary | ICD-10-CM | POA: Diagnosis not present

## 2018-05-13 DIAGNOSIS — R51 Headache: Secondary | ICD-10-CM | POA: Diagnosis not present

## 2018-05-13 DIAGNOSIS — M791 Myalgia, unspecified site: Secondary | ICD-10-CM | POA: Diagnosis not present

## 2018-05-13 DIAGNOSIS — R0981 Nasal congestion: Secondary | ICD-10-CM | POA: Diagnosis not present

## 2018-05-13 DIAGNOSIS — R062 Wheezing: Secondary | ICD-10-CM | POA: Diagnosis not present

## 2018-05-13 DIAGNOSIS — J069 Acute upper respiratory infection, unspecified: Secondary | ICD-10-CM | POA: Diagnosis not present

## 2018-05-13 DIAGNOSIS — Z87891 Personal history of nicotine dependence: Secondary | ICD-10-CM | POA: Diagnosis not present

## 2018-05-29 ENCOUNTER — Other Ambulatory Visit: Payer: Self-pay | Admitting: Cardiology

## 2018-06-09 ENCOUNTER — Telehealth: Payer: Self-pay | Admitting: Cardiology

## 2018-06-09 MED ORDER — CLONIDINE HCL 0.2 MG PO TABS
ORAL_TABLET | ORAL | 0 refills | Status: DC
Start: 2018-06-09 — End: 2019-02-09

## 2018-06-09 NOTE — Telephone Encounter (Signed)
New Message   *STAT* If patient is at the pharmacy, call can be transferred to refill team.   1. Which medications need to be refilled? (please list name of each medication and dose if known) cloNIDine (CATAPRES) 0.2 MG tablet  2. Which pharmacy/location (including street and city if local pharmacy) is medication to be sent to? [place], [place]  3. Do they need a 30 day or 90 day supply? [place]

## 2018-06-09 NOTE — Telephone Encounter (Signed)
OV scheduled 10/30, overdue for f/u.    90 days supply sent to pharmacy, advised to keep upcoming appt for further refills.

## 2018-06-17 ENCOUNTER — Other Ambulatory Visit: Payer: Self-pay | Admitting: Cardiology

## 2018-06-17 ENCOUNTER — Other Ambulatory Visit: Payer: Self-pay | Admitting: Family Medicine

## 2018-06-17 NOTE — Telephone Encounter (Signed)
Rx request sent to pharmacy.  

## 2018-06-25 ENCOUNTER — Other Ambulatory Visit: Payer: Self-pay | Admitting: Cardiovascular Disease

## 2018-06-26 ENCOUNTER — Other Ambulatory Visit: Payer: Self-pay

## 2018-06-26 MED ORDER — LABETALOL HCL 300 MG PO TABS: 300.0000 mg | ORAL_TABLET | Freq: Two times a day (BID) | ORAL | 0 refills | Status: DC

## 2018-07-21 ENCOUNTER — Other Ambulatory Visit: Payer: Self-pay | Admitting: Family Medicine

## 2018-07-28 ENCOUNTER — Telehealth: Payer: Self-pay | Admitting: Cardiology

## 2018-07-28 NOTE — Telephone Encounter (Signed)
Called pharmacy, advised PharmD that the reason patient received 15 tablets is because the patient needs an appointment before we can give out anymore as they have not been seen since 2017.

## 2018-07-28 NOTE — Telephone Encounter (Signed)
New message:      Pt c/o medication issue:  1. Name of Medication: spironolactone (ALDACTONE) 50 MG tablet  2. How are you currently taking this medication (dosage and times per day)? TAKE ONE TABLET BY MOUTH EVERY DAY  3. Are you having a reaction (difficulty breathing--STAT)? No  4. What is your medication issue? Anne Ng is calling from pharmacy to verify how this medication to be taken. She states that the pt was only given 15 tablets. Please advise.

## 2018-07-29 NOTE — Progress Notes (Signed)
HPI: FU hypertension. Previous renal Dopplers were technically difficult. There was no stenosis on the left and the right was not well visualized. Follow up CTA to rule out renal artery stenosis was done and was negative. Last Myoview in November 2012 showed an ejection fraction of 71%, breast attenuation but no ischemia. Echocardiogram October 2017 showed vigorous LV function and mild left atrial enlargement. Since last seen 11/17 she has mild dyspnea on exertion but no orthopnea, PND or pedal edema.  No exertional chest pain.  She has occasional brief flutters in her chest followed by near syncope but no syncopal episodes.  Current Outpatient Medications  Medication Sig Dispense Refill  . amitriptyline (ELAVIL) 10 MG tablet Take 10 mg by mouth.    [name] amLODipine-olmesartan (AZOR) 10-40 MG tablet TAKE ONE TABLET BY MOUTH EVERY DAY 30 tablet 11  . atorvastatin (LIPITOR) 80 MG tablet Take 80 mg by mouth at bedtime.    . busPIRone (BUSPAR) 10 MG tablet Take 1 tablet by mouth twice daily 60 tablet 11  . cloNIDine (CATAPRES) 0.2 MG tablet TAKE TWO TABLETS BY MOUTH 3 TIMES A DAY, please keep upcoming appt for further refills 540 tablet 0  . diclofenac sodium (VOLTAREN) 1 % GEL APPLY 2 - 4 GRAMS TOPICALLY THREE TO FOUR TIMES DAILY TO AFFECTED AREA 500 g 14  . DULoxetine (CYMBALTA) 60 MG capsule Take 1 capsule (60 mg total) by mouth every evening. 90 capsule 1  . EQ ASPIRIN 325 MG tablet Take 325 mg by mouth daily.    [name] esomeprazole (NEXIUM) 40 MG capsule Take 40 mg by mouth 2 (two) times daily as needed. 1 capsule daily scheduled, will take an additional capsule if needed    . folic acid (FOLVITE) 1 MG tablet Take 1 mg by mouth daily.     . furosemide (LASIX) 40 MG tablet Take 40 mg by mouth 2 (two) times daily.    [name] gabapentin (NEURONTIN) 300 MG capsule TAKE 1 CAPSULE BY MOUTH AT BEDTIME X7 DAYS, THEN 1 CAPSULE 2X/DAY FOR 7 DAYS, THEN 3X'S A DAY. MAY 2X WEEKLY TO MAX OF 12 CAPS/DAY 180 capsule 11    . hydrALAZINE (APRESOLINE) 100 MG tablet TAKE ONE TABLET BY MOUTH 3 TIMES A DAY 90 tablet 11  . labetalol (NORMODYNE) 300 MG tablet Take 1 tablet (300 mg total) by mouth 2 (two) times daily. 180 tablet 0  . lidocaine (XYLOCAINE) 5 % ointment Apply 1 application topically as needed.    . Multiple Vitamins-Minerals (MULTIVITAMINS THER. W/MINERALS) TABS Take 1 tablet by mouth daily.      [name] spironolactone (ALDACTONE) 50 MG tablet TAKE ONE TABLET BY MOUTH EVERY DAY 15 tablet 11  . VENTOLIN HFA 108 (90 BASE) MCG/ACT inhaler prn     No current facility-administered medications for this visit.      Past Medical History:  Diagnosis Date  . Bipolar disorder ([place])   . CHF (congestive heart failure) ([place])   . Chronic diastolic heart failure (HCC)    hospitalized at [facility] regional  . Complication of anesthesia    had "breathing problems and ended up in ICU" 03/2010 report on chart  . DDD (degenerative disc disease), lumbar   . 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 GERD (gastroesophageal reflux disease)   . Glucose intolerance (impaired glucose tolerance)   . HLD (hyperlipidemia)    nec/nos  . HTN (hypertension)    benign essential  . MVP (mitral valve prolapse)   .  Narcolepsy   . Suicide attempt ([place])    hx  . Syncope    admx to [facility] in 2/12: Echo with normal LVF; head CT unremarkable, ECG stable, no further w/u  . Ulcer     Past Surgical History:  Procedure Laterality Date  . AUGMENTATION MAMMAPLASTY    . BLADDER SUSPENSION    . GASTRIC BYPASS    . LUMBAR LAMINECTOMY/DECOMPRESSION MICRODISCECTOMY  09/11/2011   Procedure: LUMBAR LAMINECTOMY/DECOMPRESSION MICRODISCECTOMY;  Surgeon: Tobi Bastos;  Location: [facility];  Service: Orthopedics;  Laterality: Right;  Hemi Laminectomy/Microdiscectomy L5 - S1 on the Right (X-Ray)  . PARTIAL HYSTERECTOMY    . REPAIR PERONEAL TENDONS ANKLE      Social History   Socioeconomic History  . Marital status: Married    Spouse name:  Not on file  . Number of children: Not on file  . Years of education: Not on file  . Highest education level: Not on file  Occupational History  . Not on file  Social Needs  . Financial resource strain: Not on file  . Food insecurity:    Worry: Not on file    Inability: Not on file  . Transportation needs:    Medical: Not on file    Non-medical: Not on file  Tobacco Use  . Smoking status: Former Smoker    Last attempt to quit: 10/08/2003    Years since quitting: 14.8  . Smokeless tobacco: Never Used  Substance and Sexual Activity  . Alcohol use: Yes    Comment: rare  . Drug use: No  . Sexual activity: Not on file  Lifestyle  . Physical activity:    Days per week: Not on file    Minutes per session: Not on file  . Stress: Not on file  Relationships  . Social connections:    Talks on phone: Not on file    Gets together: Not on file    Attends religious service: Not on file    Active member of club or organization: Not on file    Attends meetings of clubs or organizations: Not on file    Relationship status: Not on file  . Intimate partner violence:    Fear of current or ex partner: Not on file    Emotionally abused: Not on file    Physically abused: Not on file    Forced sexual activity: Not on file  Other Topics Concern  . Not on file  Social History Narrative   Regularly exercises.     Family History  Problem Relation Age of Onset  . Heart attack Father 38  . Hypertension Father   . Diabetes Mother   . Hypertension Mother   . Stroke Other        grandmother    ROS: no fevers or chills, productive cough, hemoptysis, dysphasia, odynophagia, melena, hematochezia, dysuria, hematuria, rash, seizure activity, orthopnea, PND, pedal edema, claudication. Remaining systems are negative.  Physical Exam: Well-developed well-nourished in no acute distress.  Skin is warm and dry.  HEENT is normal.  Neck is supple.  Chest is clear to auscultation with normal expansion.   Cardiovascular exam is regular rate and rhythm.  Abdominal exam nontender or distended. No masses palpated. Extremities show no edema. neuro grossly intact  ECG-sinus rhythm at a rate of 76.  Cannot rule out prior septal infarct.  Lateral T wave inversion.  Personally reviewed  A/P  1 hypertension-blood pressure is elevated.  She states her systolic is  in the 140s at home.  Increase labetalol to 400 mg twice daily and follow.  2 hyperlipidemia-managed by primary care.  3 history of dyspnea-previous cardiac evaluation negative and no change in symptoms.  No plans for further evaluation at this time.  4 palpitations/near syncope-we will arrange an event monitor to further assess.  Kirk Ruths, MD

## 2018-08-05 ENCOUNTER — Encounter: Payer: Self-pay | Admitting: Cardiology

## 2018-08-05 ENCOUNTER — Ambulatory Visit (INDEPENDENT_AMBULATORY_CARE_PROVIDER_SITE_OTHER): Payer: Medicare Other | Admitting: Cardiology

## 2018-08-05 VITALS — BP 154/106 | HR 85 | Ht 66.0 in | Wt 199.8 lb

## 2018-08-05 DIAGNOSIS — R55 Syncope and collapse: Secondary | ICD-10-CM

## 2018-08-05 DIAGNOSIS — I10 Essential (primary) hypertension: Secondary | ICD-10-CM | POA: Diagnosis not present

## 2018-08-05 DIAGNOSIS — R002 Palpitations: Secondary | ICD-10-CM | POA: Diagnosis not present

## 2018-08-05 MED ORDER — SPIRONOLACTONE 50 MG PO TABS: 50.0000 mg | ORAL_TABLET | Freq: Every day | ORAL | 3 refills | Status: DC

## 2018-08-05 MED ORDER — LABETALOL HCL 200 MG PO TABS: 400.0000 mg | ORAL_TABLET | Freq: Two times a day (BID) | ORAL | 3 refills | Status: DC

## 2018-08-05 NOTE — Patient Instructions (Signed)
Medication Instructions:  Your physician has recommended you make the following change in your medication:  1) INCREASE Labetalol to 400mg  twice daily. An Rx has been sent to your pharmacy  Spironolactone has been refilled today  If you need a refill on your cardiac medications before your next appointment, please call your pharmacy.   Lab work: None ordered   Testing/Procedures: Your physician has recommended that you wear an event monitor. Event monitors are medical devices that record the heart's electrical activity. Doctors most often [place] these monitors to diagnose arrhythmias. Arrhythmias are problems with the speed or rhythm of the heartbeat. The monitor is a small, portable device. You can wear one while you do your normal daily activities. This is usually used to diagnose what is causing palpitations/syncope (passing out). ([address], [place]) [place]  Your appt is scheduled for 08/13/18 @ 12pm  Follow-Up: At [facility], you and your health needs are our priority.  As part of our continuing mission to provide you with exceptional heart care, we have created designated Provider Care Teams.  These Care Teams include your primary Cardiologist (physician) and Advanced Practice Providers (APPs -  Physician Assistants and Nurse Practitioners) who all work together to provide you with the care you need, when you need it.  Your physician recommends that you schedule a follow-up appointment in: 6 months with Dr.Crenshaw   Any Other Special Instructions Will Be Listed Below (If Applicable).

## 2018-08-07 ENCOUNTER — Other Ambulatory Visit: Payer: Self-pay | Admitting: Family Medicine

## 2018-08-13 ENCOUNTER — Ambulatory Visit (INDEPENDENT_AMBULATORY_CARE_PROVIDER_SITE_OTHER): Payer: Medicare Other

## 2018-08-13 DIAGNOSIS — R002 Palpitations: Secondary | ICD-10-CM

## 2018-08-13 DIAGNOSIS — R55 Syncope and collapse: Secondary | ICD-10-CM

## 2018-08-17 ENCOUNTER — Telehealth: Payer: Self-pay | Admitting: Cardiology

## 2018-08-17 ENCOUNTER — Encounter: Payer: Self-pay | Admitting: Cardiology

## 2018-08-17 NOTE — Telephone Encounter (Signed)
[employer] monitoring called stating that tele showed NSR and then 13 beats of wide complex tachycardia and then went back into NSR.  Called patient but could not get in touch with her. Will forward to Dr. Stanford Breed for further evaluation.  Please call patient - needs to be seen by extender or Dr. Stanford Breed in am for [facility] on tele monitor

## 2018-08-17 NOTE — Telephone Encounter (Signed)
This encounter was created in error - please disregard.

## 2018-08-17 NOTE — Telephone Encounter (Signed)
Follow Up ° °Pt returning call for nurse. Please call °

## 2018-08-17 NOTE — Telephone Encounter (Signed)
Lm to call back ./cy 

## 2018-08-17 NOTE — Telephone Encounter (Signed)
[employer] monitoring called stating that tele showed NSR and then 13 beats of wide complex tachycardia and then went back into NSR.  Called patient but could not get in touch with her. Will forward to Dr. Stanford Breed for further evaluation.

## 2018-08-17 NOTE — Telephone Encounter (Signed)
Please contact patient.  If patient was asymptomatic with 13 beats of wide-complex tachycardia that was documented would continue monitor and present medications.  Arrange elective follow-up PA office visit. Kirk Ruths, MD

## 2018-08-17 NOTE — Telephone Encounter (Signed)
Spoke with pt who states at the time of the event she felt very SOB and she has felt tired today. Per Dr. Ellyn Hack DOD pt needs to be seen in office. Appointment made for pt to see Dr. Stanford Breed tomorrow 08/18/18 at 120 pm.

## 2018-08-18 ENCOUNTER — Encounter: Payer: Self-pay | Admitting: Cardiology

## 2018-08-18 ENCOUNTER — Ambulatory Visit (INDEPENDENT_AMBULATORY_CARE_PROVIDER_SITE_OTHER): Payer: Medicare Other | Admitting: Cardiology

## 2018-08-18 VITALS — BP 140/86 | HR 74 | Ht 66.0 in | Wt 200.0 lb

## 2018-08-18 DIAGNOSIS — I472 Ventricular tachycardia: Secondary | ICD-10-CM

## 2018-08-18 DIAGNOSIS — R002 Palpitations: Secondary | ICD-10-CM

## 2018-08-18 DIAGNOSIS — I10 Essential (primary) hypertension: Secondary | ICD-10-CM | POA: Diagnosis not present

## 2018-08-18 DIAGNOSIS — E7800 Pure hypercholesterolemia, unspecified: Secondary | ICD-10-CM

## 2018-08-18 DIAGNOSIS — R000 Tachycardia, unspecified: Secondary | ICD-10-CM

## 2018-08-18 NOTE — Progress Notes (Signed)
HPI: FU hypertension. Previous renal Dopplers were technically difficult. There was no stenosis on the left and the right was not well visualized. Follow up CTA to rule out renal artery stenosis was done and was negative. Last Myoview in November 2012 showed an ejection fraction of 71%, breast attenuation but no ischemia. Echocardiogram October 2017 showed vigorous LV function and mild left atrial enlargement.  Patient had a event monitor placed at last office visit.  She had 13 beats of wide-complex tachycardia was added to my schedule today.  Since last seen  she has dyspnea on exertion unchanged.  No orthopnea, PND or chest pain.  Occasional brief palpitations with mild dizziness.  She has not had syncope.  Note when she had 13 beats of wide-complex tachycardia she did not have any symptoms.  Current Outpatient Medications  Medication Sig Dispense Refill  . amitriptyline (ELAVIL) 10 MG tablet Take 10 mg by mouth.    [name] amLODipine-olmesartan (AZOR) 10-40 MG tablet TAKE ONE TABLET BY MOUTH EVERY DAY 30 tablet 11  . atorvastatin (LIPITOR) 80 MG tablet Take 80 mg by mouth at bedtime.    . busPIRone (BUSPAR) 10 MG tablet Take 1 tablet by mouth twice daily 60 tablet 11  . cloNIDine (CATAPRES) 0.2 MG tablet TAKE TWO TABLETS BY MOUTH 3 TIMES A DAY, please keep upcoming appt for further refills 540 tablet 0  . diclofenac sodium (VOLTAREN) 1 % GEL APPLY 2 - 4 GRAMS TOPICALLY THREE TO FOUR TIMES DAILY TO AFFECTED AREA 500 g 14  . DULoxetine (CYMBALTA) 60 MG capsule Take 1 capsule (60 mg total) by mouth every evening. 90 capsule 1  . EQ ASPIRIN 325 MG tablet Take 325 mg by mouth daily.    [name] esomeprazole (NEXIUM) 40 MG capsule Take 40 mg by mouth 2 (two) times daily as needed. 1 capsule daily scheduled, will take an additional capsule if needed    . folic acid (FOLVITE) 1 MG tablet Take 1 mg by mouth daily.     . furosemide (LASIX) 40 MG tablet Take 40 mg by mouth 2 (two) times daily.    [name] gabapentin  (NEURONTIN) 300 MG capsule TAKE 1 CAPSULE BY MOUTH AT BEDTIME X7 DAYS, THEN 1 CAPSULE 2X/DAY FOR 7 DAYS, THEN 3X'S A DAY. MAY 2X WEEKLY TO MAX OF 12 CAPS/DAY 180 capsule 11  . hydrALAZINE (APRESOLINE) 100 MG tablet TAKE ONE TABLET BY MOUTH 3 TIMES A DAY 90 tablet 11  . labetalol (NORMODYNE) 200 MG tablet Take 2 tablets (400 mg total) by mouth 2 (two) times daily. 360 tablet 3  . lidocaine (XYLOCAINE) 5 % ointment Apply 1 application topically as needed.    . Multiple Vitamins-Minerals (MULTIVITAMINS THER. W/MINERALS) TABS Take 1 tablet by mouth daily.      [name] spironolactone (ALDACTONE) 50 MG tablet Take 1 tablet (50 mg total) by mouth daily. 90 tablet 3  . VENTOLIN HFA 108 (90 BASE) MCG/ACT inhaler prn     No current facility-administered medications for this visit.      Past Medical History:  Diagnosis Date  . Bipolar disorder ([place])   . CHF (congestive heart failure) ([place])   . Chronic diastolic heart failure (HCC)    hospitalized at [facility]  . Complication of anesthesia    had "breathing problems and ended up in ICU" 03/2010 report on chart  . DDD (degenerative disc disease), lumbar   . 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 GERD (gastroesophageal  reflux disease)   . Glucose intolerance (impaired glucose tolerance)   . HLD (hyperlipidemia)    nec/nos  . HTN (hypertension)    benign essential  . MVP (mitral valve prolapse)   . Narcolepsy   . Suicide attempt ([place])    hx  . Syncope    admx to [facility] in 2/12: Echo with normal LVF; head CT unremarkable, ECG stable, no further w/u  . Ulcer     Past Surgical History:  Procedure Laterality Date  . AUGMENTATION MAMMAPLASTY    . BLADDER SUSPENSION    . GASTRIC BYPASS    . LUMBAR LAMINECTOMY/DECOMPRESSION MICRODISCECTOMY  09/11/2011   Procedure: LUMBAR LAMINECTOMY/DECOMPRESSION MICRODISCECTOMY;  Surgeon: Tobi Bastos;  Location: [facility];  Service: Orthopedics;  Laterality: Right;  Hemi Laminectomy/Microdiscectomy  L5 - S1 on the Right (X-Ray)  . PARTIAL HYSTERECTOMY    . REPAIR PERONEAL TENDONS ANKLE      Social History   Socioeconomic History  . Marital status: Married    Spouse name: Not on file  . Number of children: Not on file  . Years of education: Not on file  . Highest education level: Not on file  Occupational History  . Not on file  Social Needs  . Financial resource strain: Not on file  . Food insecurity:    Worry: Not on file    Inability: Not on file  . Transportation needs:    Medical: Not on file    Non-medical: Not on file  Tobacco Use  . Smoking status: Former Smoker    Last attempt to quit: 10/08/2003    Years since quitting: 14.8  . Smokeless tobacco: Never Used  Substance and Sexual Activity  . Alcohol use: Yes    Comment: rare  . Drug use: No  . Sexual activity: Not on file  Lifestyle  . Physical activity:    Days per week: Not on file    Minutes per session: Not on file  . Stress: Not on file  Relationships  . Social connections:    Talks on phone: Not on file    Gets together: Not on file    Attends religious service: Not on file    Active member of club or organization: Not on file    Attends meetings of clubs or organizations: Not on file    Relationship status: Not on file  . Intimate partner violence:    Fear of current or ex partner: Not on file    Emotionally abused: Not on file    Physically abused: Not on file    Forced sexual activity: Not on file  Other Topics Concern  . Not on file  Social History Narrative   Regularly exercises.     Family History  Problem Relation Age of Onset  . Heart attack Father 70  . Hypertension Father   . Diabetes Mother   . Hypertension Mother   . Stroke Other        grandmother    ROS: no fevers or chills, productive cough, hemoptysis, dysphasia, odynophagia, melena, hematochezia, dysuria, hematuria, rash, seizure activity, orthopnea, PND, pedal edema, claudication. Remaining systems are  negative.  Physical Exam: Well-developed well-nourished in no acute distress.  Skin is warm and dry.  HEENT is normal.  Neck is supple.  Chest is clear to auscultation with normal expansion.  Cardiovascular exam is regular rate and rhythm.  Abdominal exam nontender or distended. No masses palpated. Extremities show no edema. neuro grossly intact  A/P  1 wide-complex tachycardia-patient had 13 beats of asymptomatic wide complex tachycardia.  No history of syncope.  Continue beta-blocker.  LV function is normal.  2 hypertension-blood pressure is borderline.  Continue present medications and follow.  3 hyperlipidemia-this is followed by primary care.  4 palpitations/near syncope-we will continue with event monitor.  She has had no symptoms since it was on.  5 history of dyspnea-previous cardiac evaluation negative.  We will not pursue further evaluation at this point.  Kirk Ruths, MD

## 2018-08-18 NOTE — Patient Instructions (Signed)
Medication Instructions:  Your physician recommends that you continue on your current medications as directed. Please refer to the Current Medication list given to you today.  If you need a refill on your cardiac medications before your next appointment, please call your pharmacy.   Lab work: None ordered If you have labs (blood work) drawn today and your tests are completely normal, you will receive your results only by: [name] MyChart Message (if you have MyChart) OR . A paper copy in the mail If you have any lab test that is abnormal or we need to change your treatment, we will call you to review the results.  Testing/Procedures: None ordered  Follow-Up: At [facility], you and your health needs are our priority.  As part of our continuing mission to provide you with exceptional heart care, we have created designated Provider Care Teams.  These Care Teams include your primary Cardiologist (physician) and Advanced Practice Providers (APPs -  Physician Assistants and Nurse Practitioners) who all work together to provide you with the care you need, when you need it. Your physician recommends that you schedule a follow-up appointment in: 4-6 weeks with Dr.Crenshaw in [place]   Any Other Special Instructions Will Be Listed Below (If Applicable).

## 2018-08-19 NOTE — Telephone Encounter (Signed)
Addressed. Pt seen by Dr.Crenshaw on 11/12.

## 2018-09-01 ENCOUNTER — Other Ambulatory Visit: Payer: Self-pay | Admitting: Family Medicine

## 2018-09-07 ENCOUNTER — Other Ambulatory Visit: Payer: Self-pay | Admitting: Family Medicine

## 2018-09-07 ENCOUNTER — Telehealth: Payer: Self-pay

## 2018-09-07 NOTE — Telephone Encounter (Signed)
Critical EKG received from [facility]; called to check on patient; LMTCB if assistance is needed; EKG strips given to Dr. Jacalyn Lefevre nurse Hilda Blades for Dr. Stanford Breed to review and advise

## 2018-09-20 DIAGNOSIS — K219 Gastro-esophageal reflux disease without esophagitis: Secondary | ICD-10-CM | POA: Diagnosis not present

## 2018-09-20 DIAGNOSIS — E7800 Pure hypercholesterolemia, unspecified: Secondary | ICD-10-CM | POA: Diagnosis not present

## 2018-09-20 DIAGNOSIS — R05 Cough: Secondary | ICD-10-CM | POA: Diagnosis not present

## 2018-09-20 DIAGNOSIS — Z8673 Personal history of transient ischemic attack (TIA), and cerebral infarction without residual deficits: Secondary | ICD-10-CM | POA: Diagnosis not present

## 2018-09-20 DIAGNOSIS — I110 Hypertensive heart disease with heart failure: Secondary | ICD-10-CM | POA: Diagnosis not present

## 2018-09-20 DIAGNOSIS — Z9884 Bariatric surgery status: Secondary | ICD-10-CM | POA: Diagnosis not present

## 2018-09-20 DIAGNOSIS — I509 Heart failure, unspecified: Secondary | ICD-10-CM | POA: Diagnosis not present

## 2018-09-20 DIAGNOSIS — J40 Bronchitis, not specified as acute or chronic: Secondary | ICD-10-CM | POA: Diagnosis not present

## 2018-09-20 DIAGNOSIS — Z885 Allergy status to narcotic agent status: Secondary | ICD-10-CM | POA: Diagnosis not present

## 2018-09-20 DIAGNOSIS — J929 Pleural plaque without asbestos: Secondary | ICD-10-CM | POA: Diagnosis not present

## 2018-09-20 DIAGNOSIS — R918 Other nonspecific abnormal finding of lung field: Secondary | ICD-10-CM | POA: Diagnosis not present

## 2018-09-20 DIAGNOSIS — R062 Wheezing: Secondary | ICD-10-CM | POA: Diagnosis not present

## 2018-09-20 DIAGNOSIS — J069 Acute upper respiratory infection, unspecified: Secondary | ICD-10-CM | POA: Diagnosis not present

## 2018-09-20 DIAGNOSIS — Z79899 Other long term (current) drug therapy: Secondary | ICD-10-CM | POA: Diagnosis not present

## 2018-09-28 ENCOUNTER — Ambulatory Visit (INDEPENDENT_AMBULATORY_CARE_PROVIDER_SITE_OTHER): Payer: Medicare Other | Admitting: Family Medicine

## 2018-09-28 ENCOUNTER — Encounter: Payer: Self-pay | Admitting: Family Medicine

## 2018-09-28 VITALS — BP 150/104 | HR 97 | Ht 66.0 in | Wt 200.0 lb

## 2018-09-28 DIAGNOSIS — J40 Bronchitis, not specified as acute or chronic: Secondary | ICD-10-CM | POA: Diagnosis not present

## 2018-09-28 DIAGNOSIS — J329 Chronic sinusitis, unspecified: Secondary | ICD-10-CM

## 2018-09-28 MED ORDER — HYDROCODONE-HOMATROPINE 5-1.5 MG/5ML PO SYRP: 5.0000 mL | ORAL_SOLUTION | Freq: Every evening | ORAL | 0 refills | Status: DC | PRN

## 2018-09-28 MED ORDER — BENZONATATE 200 MG PO CAPS: 200.0000 mg | ORAL_CAPSULE | Freq: Three times a day (TID) | ORAL | 0 refills | Status: DC | PRN

## 2018-09-28 MED ORDER — AMOXICILLIN-POT CLAVULANATE 875-125 MG PO TABS: 1.0000 | ORAL_TABLET | Freq: Two times a day (BID) | ORAL | 0 refills | Status: DC

## 2018-09-28 NOTE — Progress Notes (Signed)
Acute Office Visit  Subjective:    Patient ID: [name], female    DOB: [birth year], 62 y.o.   MRN: [id number]  Chief Complaint  Patient presents with  . URI    x 1 mo     HPI Patient is in today for cough.   She says it started around 12/7 after her labetolol was increased to 400mg .  Ribs and stomach hurts. She has been getting some yellow colored mucous.  She was seen at [facility] on 09/20/18. She was given prednisone and cough syrup.  She had a chest x-ray in the emergency department showed some bronchial wall thickening.  She ahs sweats at night but no fver. + fatigue.  The last couple weeks she has had some nasal congestion and blowing out yellow mucus from her nose.  Reviewed ED notes.    Past Medical History:  Diagnosis Date  . Bipolar disorder ([place])   . CHF (congestive heart failure) ([place])   . Chronic diastolic heart failure (HCC)    hospitalized at [facility]  . Complication of anesthesia    had "breathing problems and ended up in ICU" 03/2010 report on chart  . DDD (degenerative disc disease), lumbar   . 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 GERD (gastroesophageal reflux disease)   . Glucose intolerance (impaired glucose tolerance)   . HLD (hyperlipidemia)    nec/nos  . HTN (hypertension)    benign essential  . MVP (mitral valve prolapse)   . Narcolepsy   . Suicide attempt ([place])    hx  . Syncope    admx to [facility] in 2/12: Echo with normal LVF; head CT unremarkable, ECG stable, no further w/u  . Ulcer     Past Surgical History:  Procedure Laterality Date  . AUGMENTATION MAMMAPLASTY    . BLADDER SUSPENSION    . GASTRIC BYPASS    . LUMBAR LAMINECTOMY/DECOMPRESSION MICRODISCECTOMY  09/11/2011   Procedure: LUMBAR LAMINECTOMY/DECOMPRESSION MICRODISCECTOMY;  Surgeon: Tobi Bastos;  Location: [facility];  Service: Orthopedics;  Laterality: Right;  Hemi Laminectomy/Microdiscectomy L5 - S1 on the Right (X-Ray)  . PARTIAL HYSTERECTOMY    . REPAIR  PERONEAL TENDONS ANKLE      Family History  Problem Relation Age of Onset  . Heart attack Father 53  . Hypertension Father   . Diabetes Mother   . Hypertension Mother   . Stroke Other        grandmother    Social History   Socioeconomic History  . Marital status: Married    Spouse name: Not on file  . Number of children: Not on file  . Years of education: Not on file  . Highest education level: Not on file  Occupational History  . Not on file  Social Needs  . Financial resource strain: Not on file  . Food insecurity:    Worry: Not on file    Inability: Not on file  . Transportation needs:    Medical: Not on file    Non-medical: Not on file  Tobacco Use  . Smoking status: Former Smoker    Last attempt to quit: 10/08/2003    Years since quitting: 14.9  . Smokeless tobacco: Never Used  Substance and Sexual Activity  . Alcohol use: Yes    Comment: rare  . Drug use: No  . Sexual activity: Not on file  Lifestyle  . Physical activity:    Days per week: Not on file  Minutes per session: Not on file  . Stress: Not on file  Relationships  . Social connections:    Talks on phone: Not on file    Gets together: Not on file    Attends religious service: Not on file    Active member of club or organization: Not on file    Attends meetings of clubs or organizations: Not on file    Relationship status: Not on file  . Intimate partner violence:    Fear of current or ex partner: Not on file    Emotionally abused: Not on file    Physically abused: Not on file    Forced sexual activity: Not on file  Other Topics Concern  . Not on file  Social History Narrative   Regularly exercises.     Outpatient Medications Prior to Visit  Medication Sig Dispense Refill  . amitriptyline (ELAVIL) 10 MG tablet Take 10 mg by mouth.    [name] amLODipine-olmesartan (AZOR) 10-40 MG tablet TAKE ONE TABLET BY MOUTH EVERY DAY 30 tablet 11  . atorvastatin (LIPITOR) 80 MG tablet Take 80 mg by mouth  at bedtime.    . busPIRone (BUSPAR) 10 MG tablet Take 1 tablet by mouth twice daily 60 tablet 11  . cloNIDine (CATAPRES) 0.2 MG tablet TAKE TWO TABLETS BY MOUTH 3 TIMES A DAY, please keep upcoming appt for further refills 540 tablet 0  . diclofenac sodium (VOLTAREN) 1 % GEL APPLY 2 - 4 GRAMS TOPICALLY THREE TO FOUR TIMES DAILY TO AFFECTED AREA 500 g 14  . DULoxetine (CYMBALTA) 60 MG capsule Take 1 capsule (60 mg total) by mouth every evening. 90 capsule 1  . EQ ASPIRIN 325 MG tablet Take 325 mg by mouth daily.    [name] esomeprazole (NEXIUM) 40 MG capsule Take 40 mg by mouth 2 (two) times daily as needed. 1 capsule daily scheduled, will take an additional capsule if needed    . folic acid (FOLVITE) 1 MG tablet Take 1 mg by mouth daily.     . furosemide (LASIX) 40 MG tablet Take 40 mg by mouth 2 (two) times daily.    [name] gabapentin (NEURONTIN) 300 MG capsule TAKE 1 CAPSULE BY MOUTH AT BEDTIME X7 DAYS, THEN 1 CAPSULE 2X/DAY FOR 7 DAYS, THEN 3X'S A DAY. MAY 2X WEEKLY TO MAX OF 12 CAPS/DAY 180 capsule 11  . hydrALAZINE (APRESOLINE) 100 MG tablet TAKE ONE TABLET BY MOUTH 3 TIMES A DAY 90 tablet 11  . labetalol (NORMODYNE) 200 MG tablet Take 2 tablets (400 mg total) by mouth 2 (two) times daily. 360 tablet 3  . lidocaine (XYLOCAINE) 5 % ointment Apply 1 application topically as needed.    . Multiple Vitamins-Minerals (MULTIVITAMINS THER. W/MINERALS) TABS Take 1 tablet by mouth daily.      [name] spironolactone (ALDACTONE) 50 MG tablet Take 1 tablet (50 mg total) by mouth daily. 90 tablet 3  . VENTOLIN HFA 108 (90 BASE) MCG/ACT inhaler prn     No facility-administered medications prior to visit.     Allergies  Allergen Reactions  . Codeine Nausea And Vomiting    ROS     Objective:    Physical Exam  Constitutional: She is oriented to person, place, and time. She appears well-developed and well-nourished.  HENT:  Head: Normocephalic and atraumatic.  Right Ear: External ear normal.  Left Ear: External  ear normal.  Nose: Nose normal.  Mouth/Throat: Oropharynx is clear and moist.  TMs and canals are clear.   Eyes:  Pupils are equal, round, and reactive to light. Conjunctivae and EOM are normal.  Neck: Neck supple. No thyromegaly present.  Cardiovascular: Normal rate, regular rhythm and normal heart sounds.  Pulmonary/Chest: Effort normal and breath sounds normal. She has no wheezes.  Lymphadenopathy:    She has no cervical adenopathy.  Neurological: She is alert and oriented to person, place, and time.  Skin: Skin is warm and dry.  Psychiatric: She has a normal mood and affect.    BP (!) 168/102   Pulse 97   Ht 5\' 6"  (1.676 m)   Wt 200 lb (90.7 kg)   SpO2 97%   BMI 32.28 kg/m  Wt Readings from Last 3 Encounters:  09/28/18 200 lb (90.7 kg)  08/18/18 200 lb (90.7 kg)  08/05/18 199 lb 12.8 oz (90.6 kg)    Health Maintenance Due  Topic Date Due  . COLONOSCOPY  05/14/2007  . INFLUENZA VACCINE  05/07/2018    There are no preventive care reminders to display for this patient.   Lab Results  Component Value Date   TSH 0.75 04/14/2017   Lab Results  Component Value Date   WBC 8.9 02/11/2018   HGB 14.0 02/11/2018   HCT 41.5 02/11/2018   MCV 82.2 02/11/2018   PLT 254 02/11/2018   Lab Results  Component Value Date   NA 141 02/11/2018   K 4.4 02/11/2018   CO2 27 02/11/2018   GLUCOSE 73 02/11/2018   BUN 12 02/11/2018   CREATININE 0.87 02/11/2018   BILITOT 0.6 02/11/2018   ALKPHOS 99 04/14/2017   AST 17 02/11/2018   ALT 11 02/11/2018   PROT 7.3 02/11/2018   ALBUMIN 4.1 04/14/2017   CALCIUM 9.6 02/11/2018   GFR 103.47 08/08/2011   Lab Results  Component Value Date   CHOL 191 02/11/2018   Lab Results  Component Value Date   HDL 53 02/11/2018   Lab Results  Component Value Date   LDLCALC 121 (H) 02/11/2018   Lab Results  Component Value Date   TRIG 80 02/11/2018   Lab Results  Component Value Date   CHOLHDL 3.6 02/11/2018   Lab Results  Component  Value Date   HGBA1C 5.6 04/14/2017       Assessment & Plan:   Problem List Items Addressed This Visit    None    Visit Diagnoses    Sinobronchitis    -  Primary   Relevant Medications   amoxicillin-clavulanate (AUGMENTIN) 875-125 MG tablet   benzonatate (TESSALON) 200 MG capsule   HYDROcodone-homatropine (HYCODAN) 5-1.5 MG/5ML syrup     Suspect it was initially just bronchitis but now I feel like she has a secondary sinus infection which is been going on for at least 2 weeks.  And then go ahead and treat with amoxicillin and give her some cough tabs as well as syrup at bedtime.  Also keep in mind that she recently increased her beta-blocker so she is not feeling much better over the next few days then we may need to try decreasing her labetalol and work her up further with a chest CT.  She can call back at the end of the week and let us know if she is not improving.  Meds ordered this encounter  Medications  . amoxicillin-clavulanate (AUGMENTIN) 875-125 MG tablet    Sig: Take 1 tablet by mouth 2 (two) times daily.    Dispense:  20 tablet    Refill:  0  . DISCONTD: HYDROcodone-homatropine (HYCODAN) 5-1.5 MG/5ML  syrup    Sig: Take 5 mLs by mouth at bedtime as needed for cough.    Dispense:  120 mL    Refill:  0  . benzonatate (TESSALON) 200 MG capsule    Sig: Take 1 capsule (200 mg total) by mouth 3 (three) times daily as needed for cough.    Dispense:  30 capsule    Refill:  0  . HYDROcodone-homatropine (HYCODAN) 5-1.5 MG/5ML syrup    Sig: Take 5 mLs by mouth at bedtime as needed for cough.    Dispense:  120 mL    Refill:  0     Beatrice Lecher, MD

## 2018-09-28 NOTE — Patient Instructions (Signed)
If you are not feeling at least some better by Thursday or Friday then give [place] a call back and on the schedule you for chest CT.

## 2018-10-02 ENCOUNTER — Ambulatory Visit: Payer: Federal, State, Local not specified - PPO

## 2018-10-02 ENCOUNTER — Encounter: Payer: Self-pay | Admitting: Osteopathic Medicine

## 2018-10-02 ENCOUNTER — Ambulatory Visit (INDEPENDENT_AMBULATORY_CARE_PROVIDER_SITE_OTHER): Payer: Medicare Other | Admitting: Osteopathic Medicine

## 2018-10-02 ENCOUNTER — Ambulatory Visit (INDEPENDENT_AMBULATORY_CARE_PROVIDER_SITE_OTHER): Payer: Medicare Other

## 2018-10-02 VITALS — BP 135/79 | HR 83 | Temp 99.0°F | Wt 197.7 lb

## 2018-10-02 DIAGNOSIS — R059 Cough, unspecified: Secondary | ICD-10-CM

## 2018-10-02 DIAGNOSIS — R053 Chronic cough: Secondary | ICD-10-CM

## 2018-10-02 DIAGNOSIS — R05 Cough: Secondary | ICD-10-CM

## 2018-10-02 DIAGNOSIS — R918 Other nonspecific abnormal finding of lung field: Secondary | ICD-10-CM | POA: Diagnosis not present

## 2018-10-02 DIAGNOSIS — I700 Atherosclerosis of aorta: Secondary | ICD-10-CM | POA: Diagnosis not present

## 2018-10-02 DIAGNOSIS — R0602 Shortness of breath: Secondary | ICD-10-CM

## 2018-10-02 MED ORDER — AZITHROMYCIN 250 MG PO TABS: ORAL_TABLET | ORAL | 0 refills | Status: DC

## 2018-10-02 NOTE — Progress Notes (Signed)
HPI: [name] is a 62 y.o. female who  has a past medical history of Bipolar disorder ([place]), CHF (congestive heart failure) ([place]), Chronic diastolic heart failure ([place]), Complication of anesthesia, DDD (degenerative disc disease), lumbar, Diabetes in pregnancy, GERD (gastroesophageal reflux disease), Glucose intolerance (impaired glucose tolerance), HLD (hyperlipidemia), HTN (hypertension), MVP (mitral valve prolapse), Narcolepsy, Suicide attempt ([place]), Syncope, and Ulcer.  she presents to [facility] today, 10/02/18,  for chief complaint of: Persistent cough   12 days ago was seen in emergency room for wheezing/cough x1 week, x-ray showed bronchial wall thickening in setting of bronchitis, no consolidation.  Treated for viral respiratory infection with prednisone burst and cough tablets dextromethorphan-guaifenesin.  4 days ago, was seen by PCP for this issue, concern for bronchitis with secondary sinus infection.  Treated with amoxicillin, given some cough tablets as well as syrup to use at bedtime.  Dr. Suzi Roots recommended chest CT at the end of the week if she was not feeling any better.  Patient was on my schedule today, we called down to radiology to see if we can get the CT done today -patient has history of chronic cough, multiple x-rays normal, CT chest was originally ordered in June 2019 patient was advised to return to clinic for spirometry but this was never done.  Today: We got the CT! Awaiting radiology over-read but I see a consolidation LLL. Pt reports no worsening since last visit but no better either. Persistent cough, no fever or SOB.     Past medical, surgical, social and family history reviewed and updated as necessary.   Current medication list and allergy/intolerance information reviewed:    Current Outpatient Medications  Medication Sig Dispense Refill  . amitriptyline (ELAVIL) 10 MG tablet Take 10 mg by mouth.    [name]  amLODipine-olmesartan (AZOR) 10-40 MG tablet TAKE ONE TABLET BY MOUTH EVERY DAY 30 tablet 11  . amoxicillin-clavulanate (AUGMENTIN) 875-125 MG tablet Take 1 tablet by mouth 2 (two) times daily. 20 tablet 0  . atorvastatin (LIPITOR) 80 MG tablet Take 80 mg by mouth at bedtime.    . benzonatate (TESSALON) 200 MG capsule Take 1 capsule (200 mg total) by mouth 3 (three) times daily as needed for cough. 30 capsule 0  . busPIRone (BUSPAR) 10 MG tablet Take 1 tablet by mouth twice daily 60 tablet 11  . cloNIDine (CATAPRES) 0.2 MG tablet TAKE TWO TABLETS BY MOUTH 3 TIMES A DAY, please keep upcoming appt for further refills 540 tablet 0  . diclofenac sodium (VOLTAREN) 1 % GEL APPLY 2 - 4 GRAMS TOPICALLY THREE TO FOUR TIMES DAILY TO AFFECTED AREA 500 g 14  . DULoxetine (CYMBALTA) 60 MG capsule Take 1 capsule (60 mg total) by mouth every evening. 90 capsule 1  . EQ ASPIRIN 325 MG tablet Take 325 mg by mouth daily.    [name] esomeprazole (NEXIUM) 40 MG capsule Take 40 mg by mouth 2 (two) times daily as needed. 1 capsule daily scheduled, will take an additional capsule if needed    . folic acid (FOLVITE) 1 MG tablet Take 1 mg by mouth daily.     . furosemide (LASIX) 40 MG tablet Take 40 mg by mouth 2 (two) times daily.    [name] gabapentin (NEURONTIN) 300 MG capsule TAKE 1 CAPSULE BY MOUTH AT BEDTIME X7 DAYS, THEN 1 CAPSULE 2X/DAY FOR 7 DAYS, THEN 3X'S A DAY. MAY 2X WEEKLY TO MAX OF 12 CAPS/DAY 180 capsule 11  . hydrALAZINE (APRESOLINE)  100 MG tablet TAKE ONE TABLET BY MOUTH 3 TIMES A DAY 90 tablet 11  . HYDROcodone-homatropine (HYCODAN) 5-1.5 MG/5ML syrup Take 5 mLs by mouth at bedtime as needed for cough. 120 mL 0  . labetalol (NORMODYNE) 200 MG tablet Take 2 tablets (400 mg total) by mouth 2 (two) times daily. 360 tablet 3  . lidocaine (XYLOCAINE) 5 % ointment Apply 1 application topically as needed.    . Multiple Vitamins-Minerals (MULTIVITAMINS THER. W/MINERALS) TABS Take 1 tablet by mouth daily.      [name]  spironolactone (ALDACTONE) 50 MG tablet Take 1 tablet (50 mg total) by mouth daily. 90 tablet 3  . VENTOLIN HFA 108 (90 BASE) MCG/ACT inhaler prn     No current facility-administered medications for this visit.     Allergies  Allergen Reactions  . Codeine Nausea And Vomiting      Review of Systems:  Constitutional:  No  fever, no chills, +recent illness, No unintentional weight changes. +significant fatigue.   HEENT: No  headache, no vision change, no hearing change, +sore throat, +sinus pressure  Cardiac: No  chest pain, No  pressure, No palpitations  Respiratory:  No  shortness of breath. +Cough  Gastrointestinal: No  abdominal pain, No  nausea, No  vomiting,  No  blood in stool, No  diarrhea  Musculoskeletal: No new myalgia/arthralgia  Skin: No  Rash  Neurologic: No  weakness, No  dizziness  Exam:  BP 135/79 (BP Location: Left Arm, Patient Position: Sitting, Cuff Size: Normal)   Pulse 83   Temp 99 F (37.2 C) (Oral)   Wt 197 lb 11.2 oz (89.7 kg)   SpO2 98%   BMI 31.91 kg/m   Constitutional: VS see above. General Appearance: alert, well-developed, well-nourished, NAD  Eyes: Normal lids and conjunctive, non-icteric sclera  Ears, Nose, Mouth, Throat: MMM, Normal external inspection ears/nares/mouth/lips/gums. TM normal bilaterally. Pharynx/tonsils no erythema, no exudate. Nasal mucosa normal.   Neck: No masses, trachea midline. No tenderness/mass appreciated. No lymphadenopathy  Respiratory: Normal respiratory effort. no wheeze, no rhonchi, no rales  Cardiovascular: S1/S2 normal, no murmur, no rub/gallop auscultated. RRR.  Musculoskeletal: Gait normal.   Neurological: Normal balance/coordination. No tremor.   Skin: warm, dry, intact.   Psychiatric: Normal judgment/insight. Normal mood and affect.   Ct Chest Wo Contrast  Result Date: 10/02/2018 CLINICAL DATA:  Shortness of breath, cough EXAM: CT CHEST WITHOUT CONTRAST TECHNIQUE: Multidetector CT imaging  of the chest was performed following the standard protocol without IV contrast. COMPARISON:  Chest x-ray of 05/13/2017 FINDINGS: Cardiovascular: Moderate thoracic aortic atherosclerosis is present. There are coronary artery calcifications noted particularly in the distribution of the left anterior descending coronary artery. The heart is mildly enlarged. Mediastinum/Nodes: No mediastinal or hilar adenopathy is seen. The thyroid gland is somewhat inhomogeneous but within upper limits of normal in size. No abnormality of the thoracic esophagus is evident. Lungs/Pleura: On lung window images, there is a pleural-based nodule within the left upper lobe on image 42 series 3 of 6 mm in diameter. Also within the right upper lobe a small similar pleural-based nodule is present of 5 mm on image 38. Also, a small 4 mm nodule is noted in the periphery of the right lower lobe on image 132. No additional lung nodule is seen. These nodules may be postinflammatory or postinfectious but follow-up CT the chest may be helpful. Mild biapical blebs are present right greater than left. Linear scarring is present in the left lower lobe. No pneumonia or  effusion is seen. No mass is evident. There is no evidence of focal infiltrate. The central airway is patent. Upper Abdomen: Bilateral breast implants are present. Multiple surgical sutures are present in the epigastrium apparently due to gastric bypass surgery. No other abnormality is evident on images through the upper abdomen. Musculoskeletal: The thoracic vertebrae are normal alignment with only mild degenerative change present. No compression deformity is seen. There is a mild curvature of the thoracic spine convex to the left. IMPRESSION: 1. Small noncalcified lung nodules of no more than 6 mm in diameter are noted. Non-contrast chest CT at 3-6 months is recommended. If the nodules are stable at time of repeat CT, then future CT at 18-24 months (from today's scan) is considered  optional for low-risk patients, but is recommended for high-risk patients. This recommendation follows the consensus statement: Guidelines for Management of Incidental Pulmonary Nodules Detected on CT Images: From the Fleischner Society 2017; Radiology 2017; 284:228-243. 2. Moderate thoracic aortic atherosclerosis and coronary artery calcifications in the distribution of the LAD. 3. No pneumonia or pleural effusion is seen. Electronically Signed   By: Ivar Drape M.D.   On: 10/02/2018 09:21         ASSESSMENT/PLAN: The encounter diagnosis was Cough.   Meds ordered this encounter  Medications  . azithromycin (ZITHROMAX) 250 MG tablet    Sig: 2 tabs po on Day 1, then 1 tab daily Days 2 - 5    Dispense:  6 tablet    Refill:  0    Orders Placed This Encounter  Procedures  . DG Chest 2 View    Patient Instructions  Plan: Will add additional antibiotics to cover for possible pneumonia See below for OTC medications you can add to what you're taking  Awaiting official radiology report - this may take 24-48 hours  If worse, please go to hospital!     Medications & Home Remedies for Respiratory Illness   Aches/Pains, Fever, Headache OTC Acetaminophen (Tylenol) 500 mg tablets - take max 2 tablets (1000 mg) every 6 hours (4 times per day)    Sinus Congestion OTC Nasal Saline if desired to rinse OTC Oxymetolazone (Afrin, others) sparing use due to rebound congestion, NEVER use in kids OTC Phenylephrine (Sudafed) 10 mg tablets every 4 hours (or the 12-hour formulation)* OTC Diphenhydramine (Benadryl) 25 mg tablets - take max 2 tablets every 4 hours   Cough & Sore Throat Prescription cough pills or syrups as directed OTC Dextromethorphan (Robitussin, others) - cough suppressant OTC Guaifenesin (Robitussin, Mucinex, others) - expectorant (helps cough up mucus) (Dextromethorphan and Guaifenesin also come in a combination tablet/syrup) OTC Lozenges w/ Benzocaine + Menthol  (Cepacol) Honey - as much as you want! Teas which "coat the throat" - look for ingredients Elm Bark, Licorice Root, Marshmallow Root   Other Prescription Antibiotics if these are necessary for bacterial infection - take ALL, even if you're feeling better  Don't waste your money on Vitamin C or Echinacea in acute illness - it's already too late!    *Caution in patients with high blood pressure     Result note from CT:  Please call patient:  CT of the chest did not actually show any pneumonia -she does not need to pick up the additional antibiotics, I do not think these will make any difference if there is no pneumonia present.  There were a few small nodules which will require follow-up chest CT in another 3 to 6 months to monitor.  Would recommend  follow-up with PCP in 1 to 2 weeks to recheck cough/breathing.   Visit summary with medication list and pertinent instructions was printed for patient to review. All questions at time of visit were answered - patient instructed to contact office with any additional concerns or updates. ER/RTC precautions were reviewed with the patient.   Follow-up plan: Return if symptoms worsen or fail to improve. See PCP in 1-2 weeks.   Please note: voice recognition software was used to produce this document, and typos may escape review. Please contact Dr. Sheppard Coil for any needed clarifications.

## 2018-10-02 NOTE — Patient Instructions (Signed)
Plan: Will add additional antibiotics to cover for possible pneumonia See below for OTC medications you can add to what you're taking  Awaiting official radiology report - this may take 24-48 hours  If worse, please go to hospital!     Medications & Home Remedies for Respiratory Illness   Aches/Pains, Fever, Headache OTC Acetaminophen (Tylenol) 500 mg tablets - take max 2 tablets (1000 mg) every 6 hours (4 times per day)    Sinus Congestion OTC Nasal Saline if desired to rinse OTC Oxymetolazone (Afrin, others) sparing use due to rebound congestion, NEVER use in kids OTC Phenylephrine (Sudafed) 10 mg tablets every 4 hours (or the 12-hour formulation)* OTC Diphenhydramine (Benadryl) 25 mg tablets - take max 2 tablets every 4 hours   Cough & Sore Throat Prescription cough pills or syrups as directed OTC Dextromethorphan (Robitussin, others) - cough suppressant OTC Guaifenesin (Robitussin, Mucinex, others) - expectorant (helps cough up mucus) (Dextromethorphan and Guaifenesin also come in a combination tablet/syrup) OTC Lozenges w/ Benzocaine + Menthol (Cepacol) Honey - as much as you want! Teas which "coat the throat" - look for ingredients Elm Bark, Licorice Root, Marshmallow Root   Other Prescription Antibiotics if these are necessary for bacterial infection - take ALL, even if you're feeling better  Don't waste your money on Vitamin C or Echinacea in acute illness - it's already too late!    *Caution in patients with high blood pressure

## 2018-10-15 ENCOUNTER — Other Ambulatory Visit: Payer: Self-pay | Admitting: Family Medicine

## 2018-10-19 NOTE — Progress Notes (Signed)
HPI: FU hypertension. Previous renal Dopplers were technically difficult. There was no stenosis on the left and the right was not well visualized. Follow up CTA to rule out renal artery stenosis was done and was negative. Last Myoview in November 2012 showed an ejection fraction of 71%, breast attenuation but no ischemia. EchocardiogramOctober 2017 showed vigorous LV function and mild left atrial enlargement.  Patient had a event monitor placed at previous office visit.  She had 13 beats of wide-complex tachycardia was added to my schedule today.    Chest CT December 2019 showed lung nodules and follow-up recommended 3 to 6 months.  Calcification noted in LAD.  Since last seenshe has had a recent URI with productive cough.  She has mild dyspnea on exertion but no orthopnea, PND or pedal edema.  No chest pain or syncope.  Current Outpatient Medications  Medication Sig Dispense Refill  . amitriptyline (ELAVIL) 10 MG tablet Take 10 mg by mouth.    [name] amLODipine-olmesartan (AZOR) 10-40 MG tablet TAKE ONE TABLET BY MOUTH EVERY DAY 30 tablet 11  . amoxicillin-clavulanate (AUGMENTIN) 875-125 MG tablet Take 1 tablet by mouth 2 (two) times daily. 20 tablet 0  . atorvastatin (LIPITOR) 80 MG tablet Take 80 mg by mouth at bedtime.    [name] azithromycin (ZITHROMAX) 250 MG tablet 2 tabs po on Day 1, then 1 tab daily Days 2 - 5 6 tablet 0  . benzonatate (TESSALON) 200 MG capsule Take 1 capsule (200 mg total) by mouth 3 (three) times daily as needed for cough. 30 capsule 0  . busPIRone (BUSPAR) 10 MG tablet Take 1 tablet by mouth twice daily 60 tablet 11  . cloNIDine (CATAPRES) 0.2 MG tablet TAKE TWO TABLETS BY MOUTH 3 TIMES A DAY, please keep upcoming appt for further refills 540 tablet 0  . diclofenac sodium (VOLTAREN) 1 % GEL APPLY 2 - 4 GRAMS TOPICALLY THREE TO FOUR TIMES DAILY TO AFFECTED AREA 500 g 14  . DULoxetine (CYMBALTA) 60 MG capsule TAKE ONE CAPSULE BY MOUTH EVERY EVENING 30 capsule 14  . EQ  ASPIRIN 325 MG tablet Take 325 mg by mouth daily.    [name] esomeprazole (NEXIUM) 40 MG capsule Take 40 mg by mouth 2 (two) times daily as needed. 1 capsule daily scheduled, will take an additional capsule if needed    . folic acid (FOLVITE) 1 MG tablet Take 1 mg by mouth daily.     . furosemide (LASIX) 40 MG tablet Take 40 mg by mouth 2 (two) times daily.    [name] gabapentin (NEURONTIN) 300 MG capsule TAKE 1 CAPSULE BY MOUTH AT BEDTIME X7 DAYS, THEN 1 CAPSULE 2X/DAY FOR 7 DAYS, THEN 3X'S A DAY. MAY 2X WEEKLY TO MAX OF 12 CAPS/DAY 180 capsule 11  . hydrALAZINE (APRESOLINE) 100 MG tablet TAKE ONE TABLET BY MOUTH 3 TIMES A DAY 90 tablet 0  . HYDROcodone-homatropine (HYCODAN) 5-1.5 MG/5ML syrup Take 5 mLs by mouth at bedtime as needed for cough. 120 mL 0  . labetalol (NORMODYNE) 200 MG tablet Take 2 tablets (400 mg total) by mouth 2 (two) times daily. 360 tablet 3  . lidocaine (XYLOCAINE) 5 % ointment Apply 1 application topically as needed.    . Multiple Vitamins-Minerals (MULTIVITAMINS THER. W/MINERALS) TABS Take 1 tablet by mouth daily.      [name] spironolactone (ALDACTONE) 50 MG tablet Take 1 tablet (50 mg total) by mouth daily. 90 tablet 3  . VENTOLIN HFA 108 (90 BASE) MCG/ACT inhaler  prn     No current facility-administered medications for this visit.      Past Medical History:  Diagnosis Date  . Bipolar disorder ([place])   . CHF (congestive heart failure) ([place])   . Chronic diastolic heart failure ([facility])    hospitalized at [facility]  . Complication of anesthesia    had "breathing problems and ended up in ICU" 03/2010 report on chart  . DDD (degenerative disc disease), lumbar   . 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 GERD (gastroesophageal reflux disease)   . Glucose intolerance (impaired glucose tolerance)   . HLD (hyperlipidemia)    nec/nos  . HTN (hypertension)    benign essential  . MVP (mitral valve prolapse)   . Narcolepsy   . Suicide attempt ([place])    hx  . Syncope    admx to  [facility] in 2/12: Echo with normal LVF; head CT unremarkable, ECG stable, no further w/u  . Ulcer     Past Surgical History:  Procedure Laterality Date  . AUGMENTATION MAMMAPLASTY    . BLADDER SUSPENSION    . GASTRIC BYPASS    . LUMBAR LAMINECTOMY/DECOMPRESSION MICRODISCECTOMY  09/11/2011   Procedure: LUMBAR LAMINECTOMY/DECOMPRESSION MICRODISCECTOMY;  Surgeon: Tobi Bastos;  Location: [facility];  Service: Orthopedics;  Laterality: Right;  Hemi Laminectomy/Microdiscectomy L5 - S1 on the Right (X-Ray)  . PARTIAL HYSTERECTOMY    . REPAIR PERONEAL TENDONS ANKLE      Social History   Socioeconomic History  . Marital status: Married    Spouse name: Not on file  . Number of children: Not on file  . Years of education: Not on file  . Highest education level: Not on file  Occupational History  . Not on file  Social Needs  . Financial resource strain: Not on file  . Food insecurity:    Worry: Not on file    Inability: Not on file  . Transportation needs:    Medical: Not on file    Non-medical: Not on file  Tobacco Use  . Smoking status: Former Smoker    Last attempt to quit: 10/08/2003    Years since quitting: 15.0  . Smokeless tobacco: Never Used  Substance and Sexual Activity  . Alcohol use: Yes    Comment: rare  . Drug use: No  . Sexual activity: Not on file  Lifestyle  . Physical activity:    Days per week: Not on file    Minutes per session: Not on file  . Stress: Not on file  Relationships  . Social connections:    Talks on phone: Not on file    Gets together: Not on file    Attends religious service: Not on file    Active member of club or organization: Not on file    Attends meetings of clubs or organizations: Not on file    Relationship status: Not on file  . Intimate partner violence:    Fear of current or ex partner: Not on file    Emotionally abused: Not on file    Physically abused: Not on file    Forced sexual activity: Not on file  Other Topics  Concern  . Not on file  Social History Narrative   Regularly exercises.     Family History  Problem Relation Age of Onset  . Heart attack Father 2  . Hypertension Father   . Diabetes Mother   . Hypertension Mother   . Stroke Other  grandmother    ROS: no fevers or chills, productive cough, hemoptysis, dysphasia, odynophagia, melena, hematochezia, dysuria, hematuria, rash, seizure activity, orthopnea, PND, pedal edema, claudication. Remaining systems are negative.  Physical Exam: Well-developed well-nourished in no acute distress.  Skin is warm and dry.  HEENT is normal.  Neck is supple.  Chest is clear to auscultation with normal expansion.  Cardiovascular exam is regular rate and rhythm.  Abdominal exam nontender or distended. No masses palpated. Extremities show no edema. neuro grossly intact  Electrocardiogram-sinus rhythm at a rate of 69, normal axis, lateral T wave inversion. A/P  1 coronary artery disease-based on calcification on previous CT scan.  Continue aspirin but decrease to 81 mg daily.  Continue statin.  2 hypertension-patient's blood pressure is elevated.  Increase labetalol to 500 mg twice daily and follow.  Check potassium and renal function.  3 history of 13 beats nonsustained ventricular tachycardia-continue beta-blocker.  Note LV function is normal.  4 hyperlipidemia-continue statin.  5 palpitations-continue beta-blocker.  6 abnormal chest CT-patient will need follow-up CT. I reminded her to follow-up with primary care for this issue.  Kirk Ruths, MD

## 2018-10-21 ENCOUNTER — Other Ambulatory Visit: Payer: Self-pay | Admitting: *Deleted

## 2018-10-21 ENCOUNTER — Other Ambulatory Visit: Payer: Self-pay | Admitting: Family Medicine

## 2018-10-21 ENCOUNTER — Ambulatory Visit (INDEPENDENT_AMBULATORY_CARE_PROVIDER_SITE_OTHER): Payer: Medicare Other | Admitting: Cardiology

## 2018-10-21 ENCOUNTER — Encounter: Payer: Self-pay | Admitting: Cardiology

## 2018-10-21 VITALS — BP 174/109 | HR 69 | Ht 66.0 in | Wt 200.1 lb

## 2018-10-21 DIAGNOSIS — I2510 Atherosclerotic heart disease of native coronary artery without angina pectoris: Secondary | ICD-10-CM

## 2018-10-21 DIAGNOSIS — E7800 Pure hypercholesterolemia, unspecified: Secondary | ICD-10-CM

## 2018-10-21 DIAGNOSIS — I10 Essential (primary) hypertension: Secondary | ICD-10-CM

## 2018-10-21 MED ORDER — LABETALOL HCL 100 MG PO TABS: 100.0000 mg | ORAL_TABLET | Freq: Two times a day (BID) | ORAL | 3 refills | Status: DC

## 2018-10-21 MED ORDER — ASPIRIN EC 81 MG PO TBEC: 81.0000 mg | DELAYED_RELEASE_TABLET | Freq: Every day | ORAL | 3 refills | Status: DC

## 2018-10-21 NOTE — Patient Instructions (Signed)
Medication Instructions:   INCREASE LABETALOL TO 500 MG TWICE DAILY= 2 OF THE 200MG  TABLETS AND 1 OF THE 100 MG TABLETS TWICE DAILY  DECREASE ASPIRIN TO 81 MG ONCE DAILY  Labwork:  Your physician recommends that you HAVE LAB WORK TODAY  Follow-Up:  Your physician recommends that you schedule a follow-up appointment in: [place]

## 2018-10-26 ENCOUNTER — Telehealth: Payer: Self-pay | Admitting: Cardiology

## 2018-10-26 NOTE — Telephone Encounter (Signed)
New message   Pt c/o medication issue:  1. Name of Medication: labetalol (NORMODYNE) 200 MG tablet  2. How are you currently taking this medication (dosage and times per day)? n/a  3. Are you having a reaction (difficulty breathing--STAT)?n/a  4. What is your medication issue? Pharmacy needs clarification on the dosage for this medication.

## 2018-10-26 NOTE — Telephone Encounter (Signed)
Unable to reach pt or leave a message, phone hangs up

## 2018-10-27 ENCOUNTER — Telehealth: Payer: Self-pay | Admitting: Cardiology

## 2018-10-27 NOTE — Telephone Encounter (Signed)
INCREASE LABETALOL TO 500 MG TWICE DAILY= 2 OF THE 200MG  TABLETS AND 1 OF THE 100 MG TABLETS TWICE DAILY  This would be 3 tab QD so 90 days=#[id number]

## 2018-10-27 NOTE — Telephone Encounter (Signed)
Unable to reach pt or leave a message phone cuts off

## 2018-10-27 NOTE — Telephone Encounter (Signed)
New Message   Pt c/o medication issue:  1. Name of Medication: Labetalol   2. How are you currently taking this medication (dosage and times per day)?  100mg   3. Are you having a reaction (difficulty breathing--STAT)? NO  4. What is your medication issue? Wants to know if adding to old dose or replacing it.

## 2018-10-29 DIAGNOSIS — I10 Essential (primary) hypertension: Secondary | ICD-10-CM | POA: Diagnosis not present

## 2018-10-29 LAB — BASIC METABOLIC PANEL
BUN: 11 mg/dL (ref 7–25)
CO2: 29 mmol/L (ref 20–32)
Calcium: 9.9 mg/dL (ref 8.6–10.4)
Chloride: 109 mmol/L (ref 98–110)
Creat: 0.94 mg/dL (ref 0.50–0.99)
GLUCOSE: 55 mg/dL — AB (ref 65–99)
Potassium: 4.8 mmol/L (ref 3.5–5.3)
Sodium: 143 mmol/L (ref 135–146)

## 2018-11-18 ENCOUNTER — Other Ambulatory Visit: Payer: Self-pay

## 2018-11-18 MED ORDER — HYDRALAZINE HCL 100 MG PO TABS: 100.0000 mg | ORAL_TABLET | Freq: Three times a day (TID) | ORAL | 1 refills | Status: DC

## 2018-11-19 ENCOUNTER — Other Ambulatory Visit: Payer: Self-pay | Admitting: Cardiology

## 2018-11-21 ENCOUNTER — Other Ambulatory Visit: Payer: Self-pay | Admitting: Cardiology

## 2018-12-07 DIAGNOSIS — R0789 Other chest pain: Secondary | ICD-10-CM | POA: Diagnosis not present

## 2018-12-07 DIAGNOSIS — R0602 Shortness of breath: Secondary | ICD-10-CM | POA: Diagnosis not present

## 2018-12-07 DIAGNOSIS — R0600 Dyspnea, unspecified: Secondary | ICD-10-CM | POA: Diagnosis not present

## 2018-12-07 DIAGNOSIS — R079 Chest pain, unspecified: Secondary | ICD-10-CM | POA: Diagnosis not present

## 2018-12-07 DIAGNOSIS — M25511 Pain in right shoulder: Secondary | ICD-10-CM | POA: Diagnosis not present

## 2018-12-08 DIAGNOSIS — R0602 Shortness of breath: Secondary | ICD-10-CM | POA: Diagnosis not present

## 2018-12-08 DIAGNOSIS — R079 Chest pain, unspecified: Secondary | ICD-10-CM | POA: Diagnosis not present

## 2018-12-08 DIAGNOSIS — R0600 Dyspnea, unspecified: Secondary | ICD-10-CM | POA: Diagnosis not present

## 2018-12-14 DIAGNOSIS — Z79899 Other long term (current) drug therapy: Secondary | ICD-10-CM | POA: Diagnosis not present

## 2018-12-14 DIAGNOSIS — B029 Zoster without complications: Secondary | ICD-10-CM | POA: Diagnosis not present

## 2018-12-14 DIAGNOSIS — Z8673 Personal history of transient ischemic attack (TIA), and cerebral infarction without residual deficits: Secondary | ICD-10-CM | POA: Diagnosis not present

## 2018-12-14 DIAGNOSIS — Z87891 Personal history of nicotine dependence: Secondary | ICD-10-CM | POA: Diagnosis not present

## 2018-12-14 DIAGNOSIS — I509 Heart failure, unspecified: Secondary | ICD-10-CM | POA: Diagnosis not present

## 2018-12-14 DIAGNOSIS — R21 Rash and other nonspecific skin eruption: Secondary | ICD-10-CM | POA: Diagnosis not present

## 2018-12-14 DIAGNOSIS — R9431 Abnormal electrocardiogram [ECG] [EKG]: Secondary | ICD-10-CM | POA: Diagnosis not present

## 2018-12-14 DIAGNOSIS — Z885 Allergy status to narcotic agent status: Secondary | ICD-10-CM | POA: Diagnosis not present

## 2018-12-14 DIAGNOSIS — I110 Hypertensive heart disease with heart failure: Secondary | ICD-10-CM | POA: Diagnosis not present

## 2018-12-29 ENCOUNTER — Encounter: Payer: Self-pay | Admitting: Family Medicine

## 2019-01-11 ENCOUNTER — Telehealth: Payer: Self-pay

## 2019-01-11 NOTE — Telephone Encounter (Signed)
    Is she already on gabapentin that Dr. Darene Lamer writes for her? If so I may be able to adjust her dose. How much is she taking.

## 2019-01-11 NOTE — Telephone Encounter (Signed)
Left VM for Pt to return clinic call.  

## 2019-01-11 NOTE — Telephone Encounter (Signed)
[name] went to the ED and was diagnosed with Shingles. She was treated with an anti-viral. She would like something for the nerve pain.

## 2019-01-12 MED ORDER — TRAMADOL HCL 50 MG PO TABS: 50.0000 mg | ORAL_TABLET | Freq: Four times a day (QID) | ORAL | 0 refills | Status: AC | PRN

## 2019-01-12 NOTE — Telephone Encounter (Signed)
OK, new rx sent. ci could only send about 5-7 days worse since it is a scheduled drug. She can call us back latter thsi week and let us know how she is doing and if needs refill.

## 2019-01-12 NOTE — Telephone Encounter (Signed)
Left pt msg to call back

## 2019-01-12 NOTE — Telephone Encounter (Signed)
Pt advised, will call back with update after finishing RX.

## 2019-01-12 NOTE — Telephone Encounter (Signed)
We can try tramadol.  Has she taken that before?

## 2019-01-12 NOTE — Telephone Encounter (Signed)
Spoke with pt, she thinks she has taken Tramadol before and OK with taking it.   Request RX go to [facility] in [place]

## 2019-01-12 NOTE — Telephone Encounter (Signed)
Patient states she is taking Gabapentin 1,200 mg daily (4 capsules 3 x day). Patient states this is not helping at all. She completed her Acyclovir 400 mg, 2 tablets 5 x daily, completed this course. Taking Tylenol in addition to Gabapentin but not helping pain at all.

## 2019-02-04 ENCOUNTER — Telehealth: Payer: Self-pay | Admitting: Family Medicine

## 2019-02-04 NOTE — Telephone Encounter (Signed)
Please call patient and have her schedule follow-up appointment for virtual visit for follow-up medications.  She saw cardiology in January but has not seen me since last summer.

## 2019-02-04 NOTE — Telephone Encounter (Signed)
lvm asking pt to rtn call to schedule a virtual visit for Medication management.[name], Lahoma Crocker, CMA

## 2019-02-05 ENCOUNTER — Other Ambulatory Visit: Payer: Self-pay | Admitting: Family Medicine

## 2019-02-08 NOTE — Telephone Encounter (Signed)
lvm asking that she rtn call to schedule a virtual visit to f/u on her medications.[name], [name],

## 2019-02-09 ENCOUNTER — Encounter: Payer: Self-pay | Admitting: Family Medicine

## 2019-02-09 ENCOUNTER — Telehealth (INDEPENDENT_AMBULATORY_CARE_PROVIDER_SITE_OTHER): Payer: Medicare Other | Admitting: Family Medicine

## 2019-02-09 ENCOUNTER — Telehealth: Payer: Self-pay | Admitting: *Deleted

## 2019-02-09 VITALS — BP 147/100 | HR 80 | Ht 66.0 in

## 2019-02-09 DIAGNOSIS — L658 Other specified nonscarring hair loss: Secondary | ICD-10-CM | POA: Diagnosis not present

## 2019-02-09 DIAGNOSIS — E7800 Pure hypercholesterolemia, unspecified: Secondary | ICD-10-CM | POA: Diagnosis not present

## 2019-02-09 DIAGNOSIS — B0229 Other postherpetic nervous system involvement: Secondary | ICD-10-CM | POA: Diagnosis not present

## 2019-02-09 DIAGNOSIS — M5412 Radiculopathy, cervical region: Secondary | ICD-10-CM | POA: Diagnosis not present

## 2019-02-09 DIAGNOSIS — Z636 Dependent relative needing care at home: Secondary | ICD-10-CM

## 2019-02-09 DIAGNOSIS — I10 Essential (primary) hypertension: Secondary | ICD-10-CM | POA: Diagnosis not present

## 2019-02-09 MED ORDER — MINOXIDIL 5 % EX SOLN: 1.0000 "application " | Freq: Every day | CUTANEOUS | 99 refills | Status: AC

## 2019-02-09 MED ORDER — MINOXIDIL 2 % EX SOLN: Freq: Two times a day (BID) | CUTANEOUS | 5 refills | Status: DC

## 2019-02-09 MED ORDER — CLONIDINE HCL 0.2 MG PO TABS: ORAL_TABLET | ORAL | 3 refills | Status: DC

## 2019-02-09 MED ORDER — FINASTERIDE 1 MG PO TABS: 1.0000 mg | ORAL_TABLET | Freq: Every day | ORAL | 1 refills | Status: DC

## 2019-02-09 MED ORDER — TRAMADOL HCL 50 MG PO TABS: 50.0000 mg | ORAL_TABLET | Freq: Four times a day (QID) | ORAL | 0 refills | Status: AC | PRN

## 2019-02-09 NOTE — Telephone Encounter (Addendum)
appt scheduled for today.Marland KitchenMarland KitchenElouise [name], [place]

## 2019-02-09 NOTE — Telephone Encounter (Signed)
Spoke with Sam asked that the Rx for Minoxidil 2%, and clonidine be cancelled.[name], [place]

## 2019-02-09 NOTE — Progress Notes (Signed)
Virtual Visit via Telephone Note  I connected with [name] on 02/09/19 at  2:20 PM EDT by a telephone enabled telemedicine application and verified that I am speaking with the correct person using two identifiers.   I discussed the limitations of evaluation and management by telemedicine and the availability of in person appointments. The patient expressed understanding and agreed to proceed.  Pt was at home and I was in my office for the virtual visit.     Subjective:    CC: F/u BP 6 mo  HPI: Hypertension- Pt denies chest pain, SOB, dizziness, or heart palpitations.  Taking meds as directed w/o problems.  Denies medication side effects.  She did take her clonidine today.     Hyperlipidemia-currently on Lipitor 80 mg nightly tolerating well without any side effects or myalgias.  Cervical radiculopathy-currently on Cymbalta 60mg  QD.  Tolerating the medication well without any concerns.  She unfortunately had an episode of shingles in March and was seen in the emergency department. She is still having some pain in the shoulder and breast.  Feels a like a deep itch as well.  Using her gabapentin.  She says she actually has taken 1200 mg today so far.  She says she even was given an ointment and has pulled that back out and has been using that as well.  She had a few tramadol that she was given with the initial breakout and said she had a final take 1 last night just to help her sleep.  Increased stress/caregiver burden-she is the primary caretaker for her husband.  Is very quickly advancing Lewy body dementia and Parkinson's. Brother has [place] and is in hospital.    She also wanted to discuss hair loss.  She is losing hair mostly on the top of her head she says welts long in the back.  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  She said she did try some women's Rogaine over-the-counter and it did seem to help she grew a little bit of soft fine hair but says that it was quite expensive  and had to discontinue it.  Past medical history, Surgical history, Family history not pertinant except as noted below, Social history, Allergies, and medications have been entered into the medical record, reviewed, and corrections made.   Review of Systems: No fevers, chills, night sweats, weight loss, chest pain, or shortness of breath.   Objective:    General: Speaking clearly in complete sentences without any shortness of breath.  Alert and oriented x3.  Normal judgment. No apparent acute distress.    Impression and Recommendations:    HTN - BP still mildly elevated. Based on refills should be out of the clonidine. Will refill today.    Hyperlipidemia-due to recheck lipid panel.  Acute stress - her cousin the hospital with COVID. She was not exposed.  Her husband has Lewy body dementia and that has been stressful as well.  Her kitchen is currently undergoing a remodel so she has been having to cook out in her RV which is also been stressful.  She is on the Cymbalta so we will continue with that.  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  It sounds like it did respond to the minoxidil some can a try sending over prescription to see if it might be cheaper and covered through her insurance and will try to increase to the 5%.  We can also try a  low dose of finasteride as well and see if this is helpful.  We can follow back up in 4 to 6 months on the hair loss.  Postherpetic neuralgia-unfortunately even though her breakout was about a month and a half ago she still having pain in the shoulder and itching in the breast area.  I did send her over 20 tabs of tramadol just to use as needed for rescue mostly at bedtime to help her rest.  She is already on the gabapentin and really at a higher strength so I really do not go up on that.  Time spent during encounter 25 minutes.  I discussed the assessment and treatment plan with the patient. The patient was  provided an opportunity to ask questions and all were answered. The patient agreed with the plan and demonstrated an understanding of the instructions.   The patient was advised to call back or seek an in-person evaluation if the symptoms worsen or if the condition fails to improve as anticipated.   Beatrice Lecher, MD

## 2019-02-09 NOTE — Progress Notes (Signed)
Pt wanted to know about getting a prescription for Vitamin D. She reports that her hair is coming out. Pt has had gastric bypass surgery. She has tried Rogaine. Maryruth Eve, Lahoma Crocker, CMA

## 2019-02-10 ENCOUNTER — Other Ambulatory Visit: Payer: Self-pay | Admitting: Sports Medicine

## 2019-02-10 DIAGNOSIS — M5416 Radiculopathy, lumbar region: Secondary | ICD-10-CM

## 2019-02-10 NOTE — Telephone Encounter (Signed)
Appointment needed for refill.  

## 2019-02-11 NOTE — Telephone Encounter (Signed)
Appt scheduled 02/12/19

## 2019-02-12 ENCOUNTER — Ambulatory Visit (INDEPENDENT_AMBULATORY_CARE_PROVIDER_SITE_OTHER): Payer: Medicare Other | Admitting: Sports Medicine

## 2019-02-12 ENCOUNTER — Telehealth: Payer: Self-pay

## 2019-02-12 DIAGNOSIS — I2510 Atherosclerotic heart disease of native coronary artery without angina pectoris: Secondary | ICD-10-CM

## 2019-02-12 DIAGNOSIS — M5412 Radiculopathy, cervical region: Secondary | ICD-10-CM | POA: Diagnosis not present

## 2019-02-12 DIAGNOSIS — M5416 Radiculopathy, lumbar region: Secondary | ICD-10-CM | POA: Diagnosis not present

## 2019-02-12 MED ORDER — GABAPENTIN 800 MG PO TABS: 800.0000 mg | ORAL_TABLET | Freq: Three times a day (TID) | ORAL | 1 refills | Status: DC

## 2019-02-12 NOTE — Progress Notes (Signed)
Virtual Visit via Telephone   I connected with  [name]  on 02/12/19 by telephone/telehealth and verified that I am speaking with the correct person using two identifiers.   I discussed the limitations, risks, security and privacy concerns of performing an evaluation and management service by telephone, including the higher likelihood of inaccurate diagnosis and treatment, and the availability of in person appointments.  We also discussed the likely need of an additional face to face encounter for complete and high quality delivery of care.  I also discussed with the patient that there may be a patient responsible charge related to this service. The patient expressed understanding and wishes to proceed.  Provider location is either at home or medical facility. Patient location is at their home, different from provider location. People involved in care of the patient during this telehealth encounter were myself, my nurse/medical assistant, and my front office/scheduling team member.  Subjective:    CC: Follow-up  HPI: [name] has lumbar radiculopathy, she has done extremely well with gabapentin, I have not seen her in 3 years, she needs a refill.  No bowel or bladder dysfunction, no saddle numbness, no progressive weakness.  I reviewed the past medical history, family history, social history, surgical history, and allergies today and no changes were needed.  Please see the problem list section below in epic for further details.  Past Medical History: Past Medical History:  Diagnosis Date  . Bipolar disorder ([place])   . CHF (congestive heart failure) ([place])   . Chronic diastolic heart failure (HCC)    hospitalized at [facility] regional  . Complication of anesthesia    had "breathing problems and ended up in ICU" 03/2010 report on chart  . DDD (degenerative disc disease), lumbar   . 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 GERD (gastroesophageal reflux disease)   . Glucose intolerance  (impaired glucose tolerance)   . HLD (hyperlipidemia)    nec/nos  . HTN (hypertension)    benign essential  . MVP (mitral valve prolapse)   . Narcolepsy   . Suicide attempt ([place])    hx  . Syncope    admx to [facility] in 2/12: Echo with normal LVF; head CT unremarkable, ECG stable, no further w/u  . Ulcer    Past Surgical History: Past Surgical History:  Procedure Laterality Date  . AUGMENTATION MAMMAPLASTY    . BLADDER SUSPENSION    . GASTRIC BYPASS    . LUMBAR LAMINECTOMY/DECOMPRESSION MICRODISCECTOMY  09/11/2011   Procedure: LUMBAR LAMINECTOMY/DECOMPRESSION MICRODISCECTOMY;  Surgeon: Tobi Bastos;  Location: [facility];  Service: Orthopedics;  Laterality: Right;  Hemi Laminectomy/Microdiscectomy L5 - S1 on the Right (X-Ray)  . PARTIAL HYSTERECTOMY    . REPAIR PERONEAL TENDONS ANKLE     Social History: Social History   Socioeconomic History  . Marital status: Married    Spouse name: Not on file  . Number of children: Not on file  . Years of education: Not on file  . Highest education level: Not on file  Occupational History  . Not on file  Social Needs  . Financial resource strain: Not on file  . Food insecurity:    Worry: Not on file    Inability: Not on file  . Transportation needs:    Medical: Not on file    Non-medical: Not on file  Tobacco Use  . Smoking status: Former Smoker    Last attempt to quit: 10/08/2003    Years  since quitting: 15.3  . Smokeless tobacco: Never Used  Substance and Sexual Activity  . Alcohol use: Yes    Comment: rare  . Drug use: No  . Sexual activity: Not on file  Lifestyle  . Physical activity:    Days per week: Not on file    Minutes per session: Not on file  . Stress: Not on file  Relationships  . Social connections:    Talks on phone: Not on file    Gets together: Not on file    Attends religious service: Not on file    Active member of club or organization: Not on file    Attends meetings of clubs or organizations:  Not on file    Relationship status: Not on file  Other Topics Concern  . Not on file  Social History Narrative   Regularly exercises.    Family History: Family History  Problem Relation Age of Onset  . Heart attack Father 29  . Hypertension Father   . Diabetes Mother   . Hypertension Mother   . Stroke Other        grandmother   Allergies: Allergies  Allergen Reactions  . Codeine Nausea And Vomiting   Medications: See med rec.  Review of Systems: No fevers, chills, night sweats, weight loss, chest pain, or shortness of breath.   Objective:    General: Speaking full sentences, no audible heavy breathing.  Sounds alert and appropriately interactive.  No other physical exam performed due to the non-face to face nature of this visit.  Impression and Recommendations:    Right cervical radiculopathy Improved considerably after physical therapy back in 2017, she was taking gabapentin at 600 mg twice daily, agreeable to go up on the dose for a little bit more symptom relief, 800 mg up to 3 times daily. Refilling medication, I would like to see her at least in a virtual visit twice a year.  I discussed the above assessment and treatment plan with the patient. The patient was provided an opportunity to ask questions and all were answered. The patient agreed with the plan and demonstrated an understanding of the instructions.   The patient was advised to call back or seek an in-person evaluation if the symptoms worsen or if the condition fails to improve as anticipated.   I provided 25 minutes of non-face-to-face time during this encounter, 15 minutes of additional time was needed to gather information, review chart, records, communicate/coordinate with staff remotely, and complete documentation.   ___________________________________________ Gwen Her. Dianah Field, M.D., ABFM., CAQSM. Primary Care and Sports Medicine [facility]  Adjunct Professor of [employer]

## 2019-02-12 NOTE — Assessment & Plan Note (Signed)
Improved considerably after physical therapy back in 2017, she was taking gabapentin at 600 mg twice daily, agreeable to go up on the dose for a little bit more symptom relief, 800 mg up to 3 times daily. Refilling medication, I would like to see her at least in a virtual visit twice a year.

## 2019-02-12 NOTE — Telephone Encounter (Signed)
[name] called and states she looked up Finasteride. She states it is not recommended for women. Please advise.

## 2019-02-12 NOTE — Telephone Encounter (Signed)
If she wants to hold off for now that is fine.  There are some studies where it was used in women specifically for hair loss.  But I can understand being a little uncomfortable with it so she just wants to start with a higher dose minoxidil than the over-the-counter then we can start with that.  Did send that prescription over as well.

## 2019-02-12 NOTE — Telephone Encounter (Signed)
Left message advising of recommendations.  

## 2019-03-12 ENCOUNTER — Other Ambulatory Visit: Payer: Self-pay | Admitting: Sports Medicine

## 2019-03-14 ENCOUNTER — Other Ambulatory Visit: Payer: Self-pay | Admitting: Family Medicine

## 2019-04-11 ENCOUNTER — Other Ambulatory Visit: Payer: Self-pay | Admitting: Cardiology

## 2019-04-19 ENCOUNTER — Other Ambulatory Visit: Payer: Self-pay | Admitting: Family Medicine

## 2019-05-19 ENCOUNTER — Other Ambulatory Visit: Payer: Self-pay | Admitting: Family Medicine

## 2019-05-22 ENCOUNTER — Other Ambulatory Visit: Payer: Self-pay | Admitting: Cardiology

## 2019-05-22 ENCOUNTER — Other Ambulatory Visit: Payer: Self-pay | Admitting: Family Medicine

## 2019-05-26 ENCOUNTER — Other Ambulatory Visit: Payer: Self-pay | Admitting: Family Medicine

## 2019-06-07 ENCOUNTER — Other Ambulatory Visit: Payer: Self-pay | Admitting: Cardiology

## 2019-06-07 ENCOUNTER — Other Ambulatory Visit: Payer: Self-pay

## 2019-06-07 ENCOUNTER — Ambulatory Visit (INDEPENDENT_AMBULATORY_CARE_PROVIDER_SITE_OTHER): Payer: Medicare Other | Admitting: Family Medicine

## 2019-06-07 VITALS — HR 80 | Wt 200.0 lb

## 2019-06-07 DIAGNOSIS — M7989 Other specified soft tissue disorders: Secondary | ICD-10-CM

## 2019-06-07 DIAGNOSIS — M5412 Radiculopathy, cervical region: Secondary | ICD-10-CM | POA: Diagnosis not present

## 2019-06-07 DIAGNOSIS — M5416 Radiculopathy, lumbar region: Secondary | ICD-10-CM | POA: Diagnosis not present

## 2019-06-07 DIAGNOSIS — I2510 Atherosclerotic heart disease of native coronary artery without angina pectoris: Secondary | ICD-10-CM

## 2019-06-07 DIAGNOSIS — M25562 Pain in left knee: Secondary | ICD-10-CM

## 2019-06-07 MED ORDER — GABAPENTIN 300 MG PO CAPS: 1200.0000 mg | ORAL_CAPSULE | Freq: Three times a day (TID) | ORAL | 3 refills | Status: DC

## 2019-06-07 NOTE — Progress Notes (Signed)
[name] is a 63 y.o. female who presents to [facility]: [place] today for left knee pain, bilateral lower extremity varicose veins, increase gabapentin.  Patient developed left knee pain and swelling started yesterday or the day before.  She cannot recall any injury.  She notes she has a history of arthritis.  She had steroid injections in the past which has helped.  She would like repeat injection if possible.  She is been trying her over-the-counter medications which have not helped very much.  Additionally she notes that some of her veins in her legs bilaterally are a little bit more prominent.  She notes they are slightly sore.  She is worried about the possibility of blood clot.  She denies any significant calf swelling or soreness.  Additionally she has bothersome lumbar radiculopathy bilaterally right worse than left.  She currently is taking 800 mg of gabapentin 2-3 times daily.  She notes that in the past she is been on 1200 mg 2-3 times a day and that worked better.  She like a different prescription for 1200 mg of gabapentin.   ROS as above:  Exam:  Pulse 80   Wt 200 lb (90.7 kg)   BMI 32.28 kg/m   Wt Readings from Last 5 Encounters:  06/08/19 200 lb (90.7 kg)  10/21/18 200 lb 1.9 oz (90.8 kg)  10/02/18 197 lb 11.2 oz (89.7 kg)  09/28/18 200 lb (90.7 kg)  08/18/18 200 lb (90.7 kg)    Gen: Well NAD HEENT: EOMI,  MMM Lungs: Normal work of breathing. CTABL Heart: RRR no MRG Abd: NABS, Soft. Nondistended, Nontender Exts: Brisk capillary refill, warm and well perfused.  MSK: Left knee moderate effusion no deformity or erythema. Range of motion 0-100 degrees.  Crepitations with range of motion. Tender to palpation medial joint line. Stable given his exam. No significant edema bilateral lower extremities.  Slight increased venous distention bilateral lower  extremities with no palpable cords or calf tenderness.   Procedure: Real-time Ultrasound Guided Injection of left knee Device: GE Logiq E   Images permanently stored and available for review in the ultrasound unit. Verbal informed consent obtained.  Discussed risks and benefits of procedure. Warned about infection bleeding damage to structures skin hypopigmentation and fat atrophy among others. Patient expresses understanding and agreement Time-out conducted.   Noted no overlying erythema, induration, or other signs of local infection.   Skin prepped in a sterile fashion.   Local anesthesia: Topical Ethyl chloride.   With sterile technique and under real time ultrasound guidance:  80 mg of Depo-Medrol and 4 mL of Marcaine injected easily.   Completed without difficulty   Pain immediately resolved suggesting accurate placement of the medication.   Advised to call if fevers/chills, erythema, induration, drainage, or persistent bleeding.   Images permanently stored and available for review in the ultrasound unit.  Impression: Technically successful ultrasound guided injection.       Assessment and Plan: 63 y.o. 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  Plan to use steroid injection as above.  Continue over-the-counter medications and compressive knee sleeve.  Recheck if not improving.  Varicosities: Unclear fundamental underlying cause.  Plan for bilateral lower extremity duplex ultrasound to evaluate for DVT.  This should be done in the next few days.  Lumbar radiculopathy: Increase gabapentin to 1200 mg 3 times daily.  Recheck back with PCP.  PDMP not reviewed this  encounter. Orders Placed This Encounter  Procedures  . US Venous Img Lower Bilateral    Standing Status:   Future    Standing Expiration Date:   08/06/2020    Order Specific Question:   Reason for Exam (SYMPTOM  OR DIAGNOSIS REQUIRED)    Answer:   eval poss leg swelling and vein swelling    Order Specific  Question:   Preferred imaging location?    Answer:   [facility]   Meds ordered this encounter  Medications  . gabapentin (NEURONTIN) 300 MG capsule    Sig: Take 4 capsules (1,200 mg total) by mouth 3 (three) times daily.    Dispense:  1080 capsule    Refill:  3     Historical information moved to improve visibility of documentation.  Past Medical History:  Diagnosis Date  . Bipolar disorder ([place])   . CHF (congestive heart failure) ([place])   . Chronic diastolic heart failure (HCC)    hospitalized at [facility]  . Complication of anesthesia    had "breathing problems and ended up in ICU" 03/2010 report on chart  . DDD (degenerative disc disease), lumbar   . 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 GERD (gastroesophageal reflux disease)   . Glucose intolerance (impaired glucose tolerance)   . HLD (hyperlipidemia)    nec/nos  . HTN (hypertension)    benign essential  . MVP (mitral valve prolapse)   . Narcolepsy   . Suicide attempt ([place])    hx  . Syncope    admx to [facility] in 2/12: Echo with normal LVF; head CT unremarkable, ECG stable, no further w/u  . Ulcer    Past Surgical History:  Procedure Laterality Date  . AUGMENTATION MAMMAPLASTY    . BLADDER SUSPENSION    . GASTRIC BYPASS    . LUMBAR LAMINECTOMY/DECOMPRESSION MICRODISCECTOMY  09/11/2011   Procedure: LUMBAR LAMINECTOMY/DECOMPRESSION MICRODISCECTOMY;  Surgeon: Tobi Bastos;  Location: [facility];  Service: Orthopedics;  Laterality: Right;  Hemi Laminectomy/Microdiscectomy L5 - S1 on the Right (X-Ray)  . PARTIAL HYSTERECTOMY    . REPAIR PERONEAL TENDONS ANKLE     Social History   Tobacco Use  . Smoking status: Former Smoker    Quit date: 10/08/2003    Years since quitting: 15.6  . Smokeless tobacco: Never Used  Substance Use Topics  . Alcohol use: Yes    Comment: rare   family history includes Diabetes in her mother; Heart attack (age of onset: 26) in her father; Hypertension in her  father and mother; Stroke in an other family member.  Medications: Current Outpatient Medications  Medication Sig Dispense Refill  . amitriptyline (ELAVIL) 10 MG tablet Take 10 mg by mouth.    [name] amLODipine-olmesartan (AZOR) 10-40 MG tablet TAKE ONE TABLET BY MOUTH EVERY DAY 90 tablet 3  . aspirin EC 81 MG tablet Take 1 tablet (81 mg total) by mouth daily. 90 tablet 3  . atorvastatin (LIPITOR) 80 MG tablet Take 80 mg by mouth at bedtime.    . busPIRone (BUSPAR) 10 MG tablet TAKE ONE TABLET BY MOUTH TWICE DAILY 60 tablet 11  . cloNIDine (CATAPRES) 0.2 MG tablet TAKE TWO TABLETS BY MOUTH 3 TIMES A DAY 180 tablet 11  . diclofenac sodium (VOLTAREN) 1 % GEL APPLY 2 TO 4 GRAMS THREE TO FOUR TIMES A DAY TO AFFECTED AREA 500 g 11  . DULoxetine (CYMBALTA) 60 MG capsule TAKE ONE CAPSULE BY MOUTH EVERY EVENING 30 capsule  14  . esomeprazole (NEXIUM) 40 MG capsule Take 40 mg by mouth 2 (two) times daily as needed. 1 capsule daily scheduled, will take an additional capsule if needed    . finasteride (PROPECIA) 1 MG tablet Take 1 tablet (1 mg total) by mouth daily. 30 tablet 1  . folic acid (FOLVITE) 1 MG tablet Take 1 mg by mouth daily.     . furosemide (LASIX) 40 MG tablet Take 1 tablet by mouth twice daily 60 tablet 11  . gabapentin (NEURONTIN) 300 MG capsule Take 4 capsules (1,200 mg total) by mouth 3 (three) times daily. 1080 capsule 3  . hydrALAZINE (APRESOLINE) 100 MG tablet Take 1 tablet (100 mg total) by mouth 3 (three) times daily. MUST SCHEDULE/KEEP APPOINTMENT FOR REFILLS 90 tablet 0  . labetalol (NORMODYNE) 100 MG tablet Take 1 tablet (100 mg total) by mouth 2 (two) times daily. 180 tablet 3  . labetalol (NORMODYNE) 200 MG tablet Take 2 tablets (400 mg total) by mouth 2 (two) times daily. 360 tablet 3  . lidocaine (XYLOCAINE) 5 % ointment Apply 1 application topically as needed.    [name] MINOXIDIL, TOPICAL, 5 % SOLN Apply 1 application topically daily. 120 mL PRN  . Multiple Vitamins-Minerals  (MULTIVITAMINS THER. W/MINERALS) TABS Take 1 tablet by mouth daily.      [name] spironolactone (ALDACTONE) 50 MG tablet Take 1 tablet (50 mg total) by mouth daily. 90 tablet 3  . VENTOLIN HFA 108 (90 BASE) MCG/ACT inhaler prn     No current facility-administered medications for this visit.    Allergies  Allergen Reactions  . Codeine Nausea And Vomiting     Discussed warning signs or symptoms. Please see discharge instructions. Patient expresses understanding.

## 2019-06-07 NOTE — Patient Instructions (Signed)
Thank you for coming in today. Call or go to the ER if you develop a large red swollen joint with extreme pain or oozing puss.  You should hear soon about the ultrasound.  Let me know if your knee pain does not improve.

## 2019-06-09 ENCOUNTER — Encounter (HOSPITAL_BASED_OUTPATIENT_CLINIC_OR_DEPARTMENT_OTHER): Payer: Self-pay | Admitting: Radiology

## 2019-06-09 ENCOUNTER — Other Ambulatory Visit: Payer: Self-pay

## 2019-06-09 ENCOUNTER — Ambulatory Visit (HOSPITAL_BASED_OUTPATIENT_CLINIC_OR_DEPARTMENT_OTHER)
Admission: RE | Admit: 2019-06-09 | Discharge: 2019-06-09 | Disposition: A | Discharge status: Home / Self Care | Payer: Medicare Other | Source: Ambulatory Visit | Attending: Family Medicine | Admitting: Family Medicine

## 2019-06-09 DIAGNOSIS — M7989 Other specified soft tissue disorders: Secondary | ICD-10-CM | POA: Insufficient documentation

## 2019-06-09 DIAGNOSIS — M79662 Pain in left lower leg: Secondary | ICD-10-CM | POA: Diagnosis not present

## 2019-06-09 DIAGNOSIS — M79661 Pain in right lower leg: Secondary | ICD-10-CM | POA: Diagnosis not present

## 2019-06-16 ENCOUNTER — Ambulatory Visit (INDEPENDENT_AMBULATORY_CARE_PROVIDER_SITE_OTHER): Payer: Medicare Other | Admitting: Family Medicine

## 2019-06-16 ENCOUNTER — Other Ambulatory Visit: Payer: Self-pay

## 2019-06-16 ENCOUNTER — Telehealth: Payer: Self-pay

## 2019-06-16 ENCOUNTER — Ambulatory Visit (INDEPENDENT_AMBULATORY_CARE_PROVIDER_SITE_OTHER): Payer: Medicare Other

## 2019-06-16 ENCOUNTER — Encounter: Payer: Self-pay | Admitting: Family Medicine

## 2019-06-16 VITALS — BP 151/95 | HR 73 | Temp 98.5°F | Wt 201.0 lb

## 2019-06-16 DIAGNOSIS — Z1231 Encounter for screening mammogram for malignant neoplasm of breast: Secondary | ICD-10-CM

## 2019-06-16 DIAGNOSIS — Z1211 Encounter for screening for malignant neoplasm of colon: Secondary | ICD-10-CM

## 2019-06-16 DIAGNOSIS — M25562 Pain in left knee: Secondary | ICD-10-CM

## 2019-06-16 DIAGNOSIS — M1711 Unilateral primary osteoarthritis, right knee: Secondary | ICD-10-CM | POA: Diagnosis not present

## 2019-06-16 DIAGNOSIS — M1712 Unilateral primary osteoarthritis, left knee: Secondary | ICD-10-CM | POA: Diagnosis not present

## 2019-06-16 DIAGNOSIS — I2510 Atherosclerotic heart disease of native coronary artery without angina pectoris: Secondary | ICD-10-CM | POA: Diagnosis not present

## 2019-06-16 MED ORDER — HYDRALAZINE HCL 100 MG PO TABS: 100.0000 mg | ORAL_TABLET | Freq: Three times a day (TID) | ORAL | 1 refills | Status: DC

## 2019-06-16 NOTE — Progress Notes (Signed)
[name] is a 63 y.o. female who presents to [facility] today for left knee pain.  Patient has had worsening knee pain over the past few weeks.  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  She had MRI in May 2019 of her left knee showing meniscus tear and degenerative changes.  Last x-ray was 2018.  She notes that she did not receive any benefit from the knee injection.  She notes considerable knee pain and swelling with limping.  She denies any injury.   ROS:  As above  Exam:  BP (!) 151/95   Pulse 73   Temp 98.5 F (36.9 C) (Oral)   Wt 201 lb (91.2 kg)   BMI 32.44 kg/m  Wt Readings from Last 5 Encounters:  06/16/19 201 lb (91.2 kg)  06/08/19 200 lb (90.7 kg)  10/21/18 200 lb 1.9 oz (90.8 kg)  10/02/18 197 lb 11.2 oz (89.7 kg)  09/28/18 200 lb (90.7 kg)   General: Well Developed, well nourished, and in no acute distress.  Neuro/Psych: Alert and oriented x3, extra-ocular muscles intact, able to move all 4 extremities, sensation grossly intact. Skin: Warm and dry, no rashes noted.  Respiratory: Not using accessory muscles, speaking in full sentences, trachea midline.  Cardiovascular: Pulses palpable, no extremity edema. Abdomen: Does not appear distended. MSK: Left knee: Moderate effusion otherwise normal-appearing. Range of motion 5-100 degrees. Tender palpation medial joint line. Mild antalgic gait present.    Lab and Radiology Results  X-ray images left knee obtained today personally independently reviewed: Moderate to severe medial compartment DJD.  Mild degenerative changes lateral compartment and patellofemoral compartment.  No acute fractures visible. Await for radiology review  Limited musculoskeletal ultrasound left medial knee reveals no significant Pes anserine bursa. Degenerative changes at medial joint line with protruding meniscus with hypoechoic fluid.   Intact MCL. Impression: Mucopurulent DJD with MCL tear.    EXAM: MRI OF THE LEFT KNEE WITHOUT CONTRAST  TECHNIQUE: Multiplanar, multisequence MR imaging of the knee was performed. No intravenous contrast was administered.  COMPARISON:  None.  FINDINGS: MENISCI  Medial meniscus: Complex tear of the posterior horn of the medial meniscus extending into the body.  Lateral meniscus: Tiny radial tear of the free edge of the body of lateral meniscus.  LIGAMENTS  Cruciates:  Intact ACL and PCL.  Collaterals: Medial collateral ligament is intact. Lateral collateral ligament complex is intact.  CARTILAGE  Patellofemoral: High-grade partial-thickness cartilage loss of medial patellar facet with subchondral reactive marrow edema. Partial-thickness cartilage loss of the medial trochlea. Small focal area of full-thickness cartilage loss of the trochlear groove.  Medial: Full-thickness cartilage loss of the medial femorotibial compartment.  Lateral: Mild partial-thickness cartilage loss of the lateral femorotibial compartment.  Joint: Moderate joint effusion. Normal Hoffa's fat. No plical thickening.  Popliteal Fossa:  No Baker cyst. Intact popliteus tendon.  Extensor Mechanism: Intact quadriceps tendon. Intact patellar tendon. Intact medial patellar retinaculum. Intact lateral patellar retinaculum. Intact MPFL.  Bones:  No acute osseous abnormality.  No aggressive osseous lesion.  Other: No muscle abnormality.  IMPRESSION: 1. Complex tear of the posterior horn of the medial meniscus extending into the body. 2. Tiny radial tear of the free edge of the body of lateral meniscus. 3. Tricompartmental cartilage abnormalities as described above.   Electronically Signed   By: Kathreen Devoid   On: 02/23/2018 10:54 I personally (independently) visualized  and performed the interpretation of the images attached in this note.      Assessment and Plan:  63 y.o. female with worsening left knee pain.  Patient has significant degenerative changes seen on x-ray.  She also has old [place] care seen on MRI from 2018.  At this point she is failing conservative management.  She is had trials of diclofenac gel, and injection with little benefit.  Based on the severity of her degenerative changes I do not think it is likely that she will benefit significant free from minimal surgery such as arthroscopic debridement.  I think she may be a good candidate for total knee replacement at this time.  Plan to refer to orthopedic surgery for evaluation and consideration of surgical options including TKR.   PDMP not reviewed this encounter. Orders Placed This Encounter  Procedures  . DG Knee Complete 4 Views Left    Please include patellar sunrise, lateral, and weightbearing bilateral AP and bilateral rosenberg views    Standing Status:   Future    Standing Expiration Date:   08/15/2020    Order Specific Question:   Reason for exam:    Answer:   Please include patellar sunrise, lateral, and weightbearing bilateral AP and bilateral rosenberg views    Comments:   Please include patellar sunrise, lateral, and weightbearing bilateral AP and bilateral rosenberg views    Order Specific Question:   Preferred imaging location?    Answer:   [facility]  . DG Knee 1-2 Views Right    Standing Status:   Future    Standing Expiration Date:   08/16/2020    Order Specific Question:   Reason for Exam (SYMPTOM  OR DIAGNOSIS REQUIRED)    Answer:   For use with the left knee x-ray bilateral AP and Rosenberg standing.    Order Specific Question:   Preferred imaging location?    Answer:   [facility]   Meds ordered this encounter  Medications  . hydrALAZINE (APRESOLINE) 100 MG tablet    Sig: Take 1 tablet (100 mg total) by mouth 3 (three) times daily.    Dispense:  270 tablet    Refill:  1    Historical information moved to improve visibility of  documentation.  Past Medical History:  Diagnosis Date  . Bipolar disorder ([place])   . CHF (congestive heart failure) ([place])   . Chronic diastolic heart failure (HCC)    hospitalized at [facility]  . Complication of anesthesia    had "breathing problems and ended up in ICU" 03/2010 report on chart  . DDD (degenerative disc disease), lumbar   . 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 GERD (gastroesophageal reflux disease)   . Glucose intolerance (impaired glucose tolerance)   . HLD (hyperlipidemia)    nec/nos  . HTN (hypertension)    benign essential  . MVP (mitral valve prolapse)   . Narcolepsy   . Suicide attempt ([place])    hx  . Syncope    admx to [facility] in 2/12: Echo with normal LVF; head CT unremarkable, ECG stable, no further w/u  . Ulcer    Past Surgical History:  Procedure Laterality Date  . AUGMENTATION MAMMAPLASTY  2003  . BLADDER SUSPENSION  2002  . DENTAL SURGERY  2016   Teeth implants x 4  . GASTRIC BYPASS  2014   at [place] MICRODISCECTOMY  09/11/2011   Procedure: LUMBAR LAMINECTOMY/DECOMPRESSION MICRODISCECTOMY;  Surgeon: Tobi Bastos;  Location: [facility];  Service: Orthopedics;  Laterality: Right;  Hemi Laminectomy/Microdiscectomy L5 - S1 on the Right (X-Ray)  . PARTIAL HYSTERECTOMY  1979  . REPAIR PERONEAL TENDONS ANKLE Left 2008   after injury    Social History   Tobacco Use  . Smoking status: Former Smoker    Quit date: 10/08/2003    Years since quitting: 15.6  . Smokeless tobacco: Never Used  Substance Use Topics  . Alcohol use: Yes    Comment: rare   family history includes Diabetes in her mother; Heart attack (age of onset: 70) in her father; Hypertension in her father and mother; Stroke in an other family member.  Medications: Current Outpatient Medications  Medication Sig Dispense Refill  . amitriptyline (ELAVIL) 10 MG tablet Take 10 mg by mouth.    [name] amLODipine-olmesartan (AZOR)  10-40 MG tablet TAKE ONE TABLET BY MOUTH EVERY DAY 90 tablet 3  . aspirin EC 81 MG tablet Take 1 tablet (81 mg total) by mouth daily. 90 tablet 3  . atorvastatin (LIPITOR) 80 MG tablet Take 80 mg by mouth at bedtime.    . busPIRone (BUSPAR) 10 MG tablet TAKE ONE TABLET BY MOUTH TWICE DAILY 60 tablet 11  . cloNIDine (CATAPRES) 0.2 MG tablet TAKE TWO TABLETS BY MOUTH 3 TIMES A DAY 180 tablet 11  . diclofenac sodium (VOLTAREN) 1 % GEL APPLY 2 TO 4 GRAMS THREE TO FOUR TIMES A DAY TO AFFECTED AREA 500 g 11  . DULoxetine (CYMBALTA) 60 MG capsule TAKE ONE CAPSULE BY MOUTH EVERY EVENING 30 capsule 14  . esomeprazole (NEXIUM) 40 MG capsule Take 40 mg by mouth 2 (two) times daily as needed. 1 capsule daily scheduled, will take an additional capsule if needed    . finasteride (PROPECIA) 1 MG tablet Take 1 tablet (1 mg total) by mouth daily. 30 tablet 1  . folic acid (FOLVITE) 1 MG tablet Take 1 mg by mouth daily.     . furosemide (LASIX) 40 MG tablet Take 1 tablet by mouth twice daily 60 tablet 11  . gabapentin (NEURONTIN) 300 MG capsule Take 4 capsules (1,200 mg total) by mouth 3 (three) times daily. 1080 capsule 3  . hydrALAZINE (APRESOLINE) 100 MG tablet Take 1 tablet (100 mg total) by mouth 3 (three) times daily. 270 tablet 1  . labetalol (NORMODYNE) 100 MG tablet Take 1 tablet (100 mg total) by mouth 2 (two) times daily. 180 tablet 3  . labetalol (NORMODYNE) 200 MG tablet Take 2 tablets (400 mg total) by mouth 2 (two) times daily. *NEEDS OFFICE VISIT* 120 tablet 0  . lidocaine (XYLOCAINE) 5 % ointment Apply 1 application topically as needed.    [name] MINOXIDIL, TOPICAL, 5 % SOLN Apply 1 application topically daily. 120 mL PRN  . Multiple Vitamins-Minerals (MULTIVITAMINS THER. W/MINERALS) TABS Take 1 tablet by mouth daily.      [name] spironolactone (ALDACTONE) 50 MG tablet Take 1 tablet (50 mg total) by mouth daily. *NEEDS OFFICE VISIT* 30 tablet 0  . VENTOLIN HFA 108 (90 BASE) MCG/ACT inhaler prn     No  current facility-administered medications for this visit.    Allergies  Allergen Reactions  . Codeine Nausea And Vomiting      Discussed warning signs or symptoms. Please see discharge instructions. Patient expresses understanding.

## 2019-06-16 NOTE — Telephone Encounter (Signed)
Kurt called and states someone called her about a mammogram and a colonoscopy. I did not see a note.

## 2019-06-16 NOTE — Patient Instructions (Signed)
Thank you for coming in today. You should hear soon about appointment with surgeon.  Keep me updated.  We will continue to follow.

## 2019-06-17 NOTE — Telephone Encounter (Signed)
lvm advising pt that orders/referral has been placed.Maryruth Eve, Lahoma Crocker, CMA

## 2019-06-18 ENCOUNTER — Encounter: Payer: Self-pay | Admitting: Gastroenterology

## 2019-06-22 ENCOUNTER — Other Ambulatory Visit: Payer: Self-pay | Admitting: Family Medicine

## 2019-07-01 DIAGNOSIS — I781 Nevus, non-neoplastic: Secondary | ICD-10-CM | POA: Diagnosis not present

## 2019-07-01 DIAGNOSIS — M79604 Pain in right leg: Secondary | ICD-10-CM | POA: Diagnosis not present

## 2019-07-09 ENCOUNTER — Other Ambulatory Visit: Payer: Self-pay | Admitting: Family Medicine

## 2019-07-13 ENCOUNTER — Ambulatory Visit (AMBULATORY_SURGERY_CENTER): Payer: Self-pay

## 2019-07-13 ENCOUNTER — Other Ambulatory Visit: Payer: Self-pay

## 2019-07-13 VITALS — Temp 96.2°F | Ht 66.0 in | Wt 201.0 lb

## 2019-07-13 DIAGNOSIS — Z1211 Encounter for screening for malignant neoplasm of colon: Secondary | ICD-10-CM

## 2019-07-13 MED ORDER — PEG 3350-KCL-NA BICARB-NACL 420 G PO SOLR: 4000.0000 mL | Freq: Once | ORAL | 0 refills | Status: AC

## 2019-07-13 NOTE — Progress Notes (Signed)
Denies allergies to eggs or soy products. Denies complication of anesthesia or sedation. Denies use of weight loss medication. Denies use of O2.   Emmi instructions given for colonoscopy.  

## 2019-07-21 ENCOUNTER — Other Ambulatory Visit: Payer: Self-pay

## 2019-07-21 ENCOUNTER — Ambulatory Visit (INDEPENDENT_AMBULATORY_CARE_PROVIDER_SITE_OTHER): Payer: Medicare Other

## 2019-07-21 DIAGNOSIS — Z1231 Encounter for screening mammogram for malignant neoplasm of breast: Secondary | ICD-10-CM | POA: Diagnosis not present

## 2019-07-23 ENCOUNTER — Telehealth: Payer: Self-pay | Admitting: Gastroenterology

## 2019-07-23 NOTE — Telephone Encounter (Signed)

## 2019-07-26 ENCOUNTER — Encounter: Payer: Self-pay | Admitting: Gastroenterology

## 2019-07-26 ENCOUNTER — Ambulatory Visit (AMBULATORY_SURGERY_CENTER): Payer: Medicare Other | Admitting: Gastroenterology

## 2019-07-26 ENCOUNTER — Other Ambulatory Visit: Payer: Self-pay

## 2019-07-26 VITALS — BP 153/87 | HR 55 | Temp 98.4°F | Resp 18 | Ht 66.0 in | Wt 201.0 lb

## 2019-07-26 DIAGNOSIS — D123 Benign neoplasm of transverse colon: Secondary | ICD-10-CM

## 2019-07-26 DIAGNOSIS — D128 Benign neoplasm of rectum: Secondary | ICD-10-CM | POA: Diagnosis not present

## 2019-07-26 DIAGNOSIS — I10 Essential (primary) hypertension: Secondary | ICD-10-CM | POA: Diagnosis not present

## 2019-07-26 DIAGNOSIS — Z1211 Encounter for screening for malignant neoplasm of colon: Secondary | ICD-10-CM

## 2019-07-26 DIAGNOSIS — D125 Benign neoplasm of sigmoid colon: Secondary | ICD-10-CM

## 2019-07-26 DIAGNOSIS — I509 Heart failure, unspecified: Secondary | ICD-10-CM | POA: Diagnosis not present

## 2019-07-26 DIAGNOSIS — J449 Chronic obstructive pulmonary disease, unspecified: Secondary | ICD-10-CM | POA: Diagnosis not present

## 2019-07-26 DIAGNOSIS — I2510 Atherosclerotic heart disease of native coronary artery without angina pectoris: Secondary | ICD-10-CM | POA: Diagnosis not present

## 2019-07-26 DIAGNOSIS — D127 Benign neoplasm of rectosigmoid junction: Secondary | ICD-10-CM | POA: Diagnosis not present

## 2019-07-26 MED ORDER — SODIUM CHLORIDE 0.9 % IV SOLN: 500.0000 mL | Freq: Once | INTRAVENOUS | Status: DC

## 2019-07-26 NOTE — Op Note (Signed)
[facility] Patient Name: [name] Procedure Date: 07/26/2019 11:12 AM MRN: [id number] Endoscopist: Remo Lipps P. Havery Moros , MD Age: 63 Referring MD:  Date of Birth: 1955-12-10 Gender: Female Account #: [id number] Procedure:                Colonoscopy Indications:              Screening for colorectal malignant neoplasm Medicines:                Monitored Anesthesia Care Procedure:                Pre-Anesthesia Assessment:                           - Prior to the procedure, a History and Physical                            was performed, and patient medications and                            allergies were reviewed. The patient's tolerance of                            previous anesthesia was also reviewed. The risks                            and benefits of the procedure and the sedation                            options and risks were discussed with the patient.                            All questions were answered, and informed consent                            was obtained. Prior Anticoagulants: The patient has                            taken no previous anticoagulant or antiplatelet                            agents. ASA Grade Assessment: III - A patient with                            severe systemic disease. After reviewing the risks                            and benefits, the patient was deemed in                            satisfactory condition to undergo the procedure.                           After obtaining informed consent, the colonoscope  was passed under direct vision. Throughout the                            procedure, the patient's blood pressure, pulse, and                            oxygen saturations were monitored continuously. The                            Colonoscope was introduced through the anus and                            advanced to the the cecum, identified by                            appendiceal  orifice and ileocecal valve. The                            colonoscopy was technically difficult and complex                            due to unsatisfactory bowel prep. The patient                            tolerated the procedure well. The quality of the                            bowel preparation was fair. The terminal ileum,                            ileocecal valve, appendiceal orifice, and rectum                            were photographed. Scope In: 11:27:11 AM Scope Out: 12:09:20 PM Scope Withdrawal Time: 0 hours 34 minutes 27 seconds  Total Procedure Duration: 0 hours 42 minutes 9 seconds  Findings:                 The perianal and digital rectal examinations were                            normal.                           A 6 mm polyp was found in the hepatic flexure. The                            polyp was sessile. The polyp was removed with a                            cold snare. Resection and retrieval were complete.                           Two sessile polyps were found in the splenic  flexure. The polyps were 3 to 8 mm in size. These                            polyps were removed with a cold snare. Resection                            and retrieval were complete.                           A 3 mm polyp was found in the rectum. The polyp was                            sessile. The polyp was removed with a cold snare.                            Resection and retrieval were complete.                           Multiple flat polyps were found in the rectum and                            sigmoid colon, suspected hyperplastic. The polyps                            were small in size.                           A large amount of liquid stool was found in the                            entire colon, making visualization difficult.                            Several minutes were spent lavage the colon using                            copious amounts  of sterile water in hopes of                            completing this exam, however the scope clogged and                            unfortuntately not all of the colon could be                            completely cleared, resulting in incomplete                            clearance with fair visualization.                           The exam was otherwise without abnormality. Complications:            No immediate  complications. Estimated blood loss:                            Minimal. Estimated Blood Loss:     Estimated blood loss was minimal. Impression:               - Preparation of the colon was fair. Several                            minutes spent lavaging the colon to clear residual                            stool, unfortunately could not clear it despite                            extra measures taken. No large lesions noted                           - One 6 mm polyp at the hepatic flexure, removed                            with a cold snare. Resected and retrieved.                           - Two 3 to 8 mm polyps at the splenic flexure,                            removed with a cold snare. Resected and retrieved.                           - One 3 mm polyp in the rectum, removed with a cold                            snare. Resected and retrieved.                           - Multiple small polyps in the rectum and in the                            sigmoid colon.                           - The examination was otherwise normal. Recommendation:           - Patient has a contact number available for                            emergencies. The signs and symptoms of potential                            delayed complications were discussed with the                            patient. Return to normal activities tomorrow.  Written discharge instructions were provided to the                            patient.                           - Resume previous  diet.                           - Continue present medications.                           - Await pathology results.                           - Repeat colonoscopy because the bowel preparation                            was suboptimal within the next 6-12 months. Remo Lipps P. Lory Galan, MD 07/26/2019 12:16:56 PM This report has been signed electronically.

## 2019-07-26 NOTE — Progress Notes (Signed)
Called to room to assist during endoscopic procedure.  Patient ID and intended procedure confirmed with present staff. Received instructions for my participation in the procedure from the performing physician.  

## 2019-07-26 NOTE — Patient Instructions (Signed)
YOU HAD AN ENDOSCOPIC PROCEDURE TODAY AT THE [facility]:   Refer to the procedure report that was given to you for any specific questions about what was found during the examination.  If the procedure report does not answer your questions, please call your gastroenterologist to clarify.  If you requested that your care partner not be given the details of your procedure findings, then the procedure report has been included in a sealed envelope for you to review at your convenience later.  **Handout given on polyps**   YOU SHOULD EXPECT: Some feelings of bloating in the abdomen. Passage of more gas than usual.  Walking can help get rid of the air that was put into your GI tract during the procedure and reduce the bloating. If you had a lower endoscopy (such as a colonoscopy or flexible sigmoidoscopy) you may notice spotting of blood in your stool or on the toilet paper. If you underwent a bowel prep for your procedure, you may not have a normal bowel movement for a few days.  Please Note:  You might notice some irritation and congestion in your nose or some drainage.  This is from the oxygen used during your procedure.  There is no need for concern and it should clear up in a day or so.  SYMPTOMS TO REPORT IMMEDIATELY:   Following lower endoscopy (colonoscopy or flexible sigmoidoscopy):  Excessive amounts of blood in the stool  Significant tenderness or worsening of abdominal pains  Swelling of the abdomen that is new, acute  Fever of 100F or higher   For urgent or emergent issues, a gastroenterologist can be reached at any hour by calling [phone].   DIET:  We do recommend a small meal at first, but then you may proceed to your regular diet.  Drink plenty of fluids but you should avoid alcoholic beverages for 24 hours.  ACTIVITY:  You should plan to take it easy for the rest of today and you should NOT DRIVE or use heavy machinery until tomorrow (because of the sedation  medicines used during the test).    FOLLOW UP: Our staff will call the number listed on your records 48-72 hours following your procedure to check on you and address any questions or concerns that you may have regarding the information given to you following your procedure. If we do not reach you, we will leave a message.  We will attempt to reach you two times.  During this call, we will ask if you have developed any symptoms of COVID 19. If you develop any symptoms (ie: fever, flu-like symptoms, shortness of breath, cough etc.) before then, please call [phone].  If you test positive for Covid 19 in the 2 weeks post procedure, please call and report this information to us.    If any biopsies were taken you will be contacted by phone or by letter within the next 1-3 weeks.  Please call us at [phone] if you have not heard about the biopsies in 3 weeks.    SIGNATURES/CONFIDENTIALITY: You and/or your care partner have signed paperwork which will be entered into your electronic medical record.  These signatures attest to the fact that that the information above on your After Visit Summary has been reviewed and is understood.  Full responsibility of the confidentiality of this discharge information lies with you and/or your care-partner. 

## 2019-07-26 NOTE — Progress Notes (Signed)
Pt's states no medical or surgical changes since previsit or office visit.  Temp taken by LS VS taken by CW

## 2019-07-26 NOTE — Progress Notes (Signed)
Report given to PACU, vss 

## 2019-07-28 ENCOUNTER — Telehealth: Payer: Self-pay

## 2019-07-28 ENCOUNTER — Other Ambulatory Visit: Payer: Self-pay | Admitting: Family Medicine

## 2019-07-28 NOTE — Telephone Encounter (Signed)
First post procedure follow up call, no answer 

## 2019-07-28 NOTE — Telephone Encounter (Signed)
Second post procedure follow up call, no answer 

## 2019-07-29 ENCOUNTER — Other Ambulatory Visit: Payer: Self-pay | Admitting: Cardiology

## 2019-07-30 ENCOUNTER — Other Ambulatory Visit: Payer: Self-pay | Admitting: Family Medicine

## 2019-08-17 ENCOUNTER — Telehealth: Payer: Self-pay | Admitting: Cardiology

## 2019-08-17 ENCOUNTER — Other Ambulatory Visit: Payer: Self-pay | Admitting: Family Medicine

## 2019-08-17 NOTE — Telephone Encounter (Signed)
New Message:    Left a message on pt's voicemail to call back and schedule an appointment with Dr Stanford Breed in the [facility].

## 2019-08-23 ENCOUNTER — Other Ambulatory Visit: Payer: Self-pay | Admitting: Cardiology

## 2019-08-23 DIAGNOSIS — I10 Essential (primary) hypertension: Secondary | ICD-10-CM

## 2019-08-25 ENCOUNTER — Other Ambulatory Visit: Payer: Self-pay | Admitting: Sports Medicine

## 2019-08-25 ENCOUNTER — Other Ambulatory Visit: Payer: Self-pay | Admitting: Family Medicine

## 2019-09-16 ENCOUNTER — Other Ambulatory Visit: Payer: Self-pay | Admitting: Family Medicine

## 2019-09-17 ENCOUNTER — Telehealth: Payer: Self-pay

## 2019-09-17 NOTE — Telephone Encounter (Signed)
I see this patient was previously being treated by Dr.Corey with Gabapentin. She does not have any diagnoses that would cover for her taking Gabapentin. Do you have any recommendations? Please advise .

## 2019-09-17 NOTE — Telephone Encounter (Signed)
PA for Gabapentin sent through CoverMyMeds.

## 2019-09-20 NOTE — Telephone Encounter (Signed)
Right cervical radiculopathy and lumbar spine pain with radiculopathy.  Let me know if these diagnoses are not helpful.

## 2019-09-25 ENCOUNTER — Other Ambulatory Visit: Payer: Self-pay | Admitting: Cardiology

## 2019-09-27 ENCOUNTER — Other Ambulatory Visit: Payer: Self-pay

## 2019-09-27 MED ORDER — LABETALOL HCL 200 MG PO TABS: 400.0000 mg | ORAL_TABLET | Freq: Two times a day (BID) | ORAL | 0 refills | Status: DC

## 2019-09-27 MED ORDER — SPIRONOLACTONE 50 MG PO TABS: 50.0000 mg | ORAL_TABLET | Freq: Every day | ORAL | 0 refills | Status: DC

## 2019-09-28 NOTE — Telephone Encounter (Signed)
I have sent the appeal letter to insurance and waiting on a response.

## 2019-09-29 NOTE — Telephone Encounter (Signed)
Your PA request has been closed. Thank you for your ePA request for Gabapentin 300mg . The quantity requested does not require prior authorization, as it does not exceed the standard allowance of up to 3600mg  per day. If a quantity greater than this is required, please contact us again.  Pharmacy is aware. Message sent to patient.

## 2019-10-15 NOTE — Progress Notes (Signed)
HPI: FU hypertension. Previous renal Dopplers were technically difficult. There was no stenosis on the left and the right was not well visualized. Follow up CTA to rule out renal artery stenosis was done and was negative. Last Myoview in November 2012 showed an ejection fraction of 71%, breast attenuation but no ischemia. EchocardiogramOctober 2017 showed vigorous LV function and mild left atrial enlargement.Patient had a event monitor placed at previous office visit. She had 13 beats of wide-complex tachycardia. Chest CT December 2019 showed lung nodules and follow-up recommended 3 to 6 months. Calcification noted in LAD. Follow-up chest CT March 2020 at [facility] showed pulmonary nodules and follow-up recommended 18 to 24 months. Since last seenshe has some dyspnea on exertion but no orthopnea, PND, pedal edema, chest pain or syncope.  Current Outpatient Medications  Medication Sig Dispense Refill  . amitriptyline (ELAVIL) 10 MG tablet Take 2 tablets by mouth every evening 60 tablet 11  . amLODipine-olmesartan (AZOR) 10-40 MG tablet Take 1 tablet by mouth every day 30 tablet 11  . aspirin 81 MG EC tablet Take 1 tablet by mouth every day 30 tablet 2  . atorvastatin (LIPITOR) 80 MG tablet Take 80 mg by mouth at bedtime.    . busPIRone (BUSPAR) 10 MG tablet TAKE ONE TABLET BY MOUTH TWICE DAILY 60 tablet 11  . cloNIDine (CATAPRES) 0.2 MG tablet TAKE TWO TABLETS BY MOUTH 3 TIMES A DAY 180 tablet 11  . diclofenac sodium (VOLTAREN) 1 % GEL APPLY 2 TO 4 GRAMS THREE TO FOUR TIMES A DAY TO AFFECTED AREA 500 g 11  . DULoxetine (CYMBALTA) 60 MG capsule Take 1 capsule by mouth every evening 30 capsule 11  . esomeprazole (NEXIUM) 40 MG capsule Take 40 mg by mouth 2 (two) times daily as needed. 1 capsule daily scheduled, will take an additional capsule if needed    . finasteride (PROPECIA) 1 MG tablet Take 1 tablet by mouth once daily 30 tablet 0  . folic acid (FOLVITE) 1 MG tablet Take 1 mg by  mouth daily.     . furosemide (LASIX) 40 MG tablet Take 1 tablet by mouth twice daily 60 tablet 11  . gabapentin (NEURONTIN) 300 MG capsule Take 4 capsules (1,200 mg total) by mouth 3 (three) times daily. 1080 capsule 3  . hydrALAZINE (APRESOLINE) 100 MG tablet Take 1 tablet (100 mg total) by mouth 3 (three) times daily. 270 tablet 1  . labetalol (NORMODYNE) 100 MG tablet Take 1 tablet by mouth twice daily 60 tablet 2  . labetalol (NORMODYNE) 200 MG tablet Take 2 tablets (400 mg total) by mouth 2 (two) times daily. *TAKE IN ADDITION TO 100MG  TABLETS BID FOR A TOTAL OF 500MG  BID *PLEASE KEEP OFFICE VISIT* 130 tablet 0  . lidocaine (XYLOCAINE) 5 % ointment Apply 1 application topically as needed.    [name] MINOXIDIL, TOPICAL, 5 % SOLN Apply 1 application topically daily. 120 mL PRN  . Multiple Vitamins-Minerals (MULTIVITAMINS THER. W/MINERALS) TABS Take 1 tablet by mouth daily.      [name] spironolactone (ALDACTONE) 50 MG tablet Take 1 tablet (50 mg total) by mouth daily. *PLEASE KEEP OFFICE VISIT FOR FURTHER REFILLS* 45 tablet 0  . VENTOLIN HFA 108 (90 BASE) MCG/ACT inhaler prn     No current facility-administered medications for this visit.     Past Medical History:  Diagnosis Date  . Allergy   . Anemia   . Anxiety   . Bipolar disorder ([place])   .  CHF (congestive heart failure) ([place])   . Chronic diastolic heart failure (HCC)    hospitalized at [facility] regional  . Complication of anesthesia    had "breathing problems and ended up in ICU" 03/2010 report on chart  . COPD (chronic obstructive pulmonary disease) ([place])   . DDD (degenerative disc disease), lumbar   . Depression   . 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 GERD (gastroesophageal reflux disease)   . Glucose intolerance (impaired glucose tolerance)   . HLD (hyperlipidemia)    nec/nos  . HTN (hypertension)    benign essential  . MVP (mitral valve prolapse)   . Narcolepsy   . Stroke ([place])   . Suicide attempt ([place])    hx  . Syncope      admx to [facility] in 2/12: Echo with normal LVF; head CT unremarkable, ECG stable, no further w/u  . Ulcer     Past Surgical History:  Procedure Laterality Date  . AUGMENTATION MAMMAPLASTY  2003  . BLADDER SUSPENSION  2002  . DENTAL SURGERY  2016   Teeth implants x 4  . GASTRIC BYPASS  2014   at [facility]    . LUMBAR LAMINECTOMY/DECOMPRESSION MICRODISCECTOMY  09/11/2011   Procedure: LUMBAR LAMINECTOMY/DECOMPRESSION MICRODISCECTOMY;  Surgeon: Tobi Bastos;  Location: [facility];  Service: Orthopedics;  Laterality: Right;  Hemi Laminectomy/Microdiscectomy L5 - S1 on the Right (X-Ray)  . PARTIAL HYSTERECTOMY  1979  . REPAIR PERONEAL TENDONS ANKLE Left 2008   after injury     Social History   Socioeconomic History  . Marital status: Married    Spouse name: Not on file  . Number of children: Not on file  . Years of education: Not on file  . Highest education level: Not on file  Occupational History  . Not on file  Tobacco Use  . Smoking status: Former Smoker    Quit date: 10/08/2003    Years since quitting: 16.0  . Smokeless tobacco: Never Used  Substance and Sexual Activity  . Alcohol use: Yes    Comment: rare  . Drug use: No  . Sexual activity: Not on file  Other Topics Concern  . Not on file  Social History Narrative   Regularly exercises.    Social Determinants of Health   Financial Resource Strain:   . Difficulty of Paying Living Expenses: Not on file  Food Insecurity:   . Worried About Charity fundraiser in the Last Year: Not on file  . Ran Out of Food in the Last Year: Not on file  Transportation Needs:   . Lack of Transportation (Medical): Not on file  . Lack of Transportation (Non-Medical): Not on file  Physical Activity:   . Days of Exercise per Week: Not on file  . Minutes of Exercise per Session: Not on file  Stress:   . Feeling of Stress : Not on file  Social Connections:   . Frequency of Communication with  Friends and Family: Not on file  . Frequency of Social Gatherings with Friends and Family: Not on file  . Attends Religious Services: Not on file  . Active Member of Clubs or Organizations: Not on file  . Attends Archivist Meetings: Not on file  . Marital Status: Not on file  Intimate Partner Violence:   . Fear of Current or Ex-Partner: Not on file  . Emotionally Abused: Not on file  . Physically Abused: Not  on file  . Sexually Abused: Not on file    Family History  Problem Relation Age of Onset  . Heart attack Father 8  . Hypertension Father   . Diabetes Mother   . Hypertension Mother   . Stroke Other        grandmother  . Colon cancer Neg Hx   . Esophageal cancer Neg Hx   . Stomach cancer Neg Hx   . Rectal cancer Neg Hx     ROS: no fevers or chills, productive cough, hemoptysis, dysphasia, odynophagia, melena, hematochezia, dysuria, hematuria, rash, seizure activity, orthopnea, PND, pedal edema, claudication. Remaining systems are negative.  Physical Exam: Well-developed well-nourished in no acute distress.  Skin is warm and dry.  HEENT is normal.  Neck is supple.  Chest is clear to auscultation with normal expansion.  Cardiovascular exam is regular rate and rhythm.  Abdominal exam nontender or distended. No masses palpated. Extremities show no edema. neuro grossly intact  ECG-sinus rhythm at a rate of 93, nonspecific ST changes.  Personally reviewed  A/P  1 hypertension-patient's blood pressure is elevated.  Increase labetalol to 600 mg twice daily.  May need to consider minoxidil in the future.  Check potassium and renal function.  2 coronary artery disease-previous CT showed coronary calcification.  We will continue with aspirin 81 mg daily and statin.  3 history of nonsustained ventricular tachycardia-continue beta-blocker.  4 hyperlipidemia-continue statin.  Check lipids and liver.  5 history of palpitations-continue beta-blocker.  6 abnormal  chest CT-Per primary care.  Kirk Ruths, MD

## 2019-10-20 ENCOUNTER — Encounter: Payer: Self-pay | Admitting: Cardiology

## 2019-10-20 ENCOUNTER — Ambulatory Visit (INDEPENDENT_AMBULATORY_CARE_PROVIDER_SITE_OTHER): Payer: Medicare Other | Admitting: Cardiology

## 2019-10-20 ENCOUNTER — Other Ambulatory Visit: Payer: Self-pay

## 2019-10-20 VITALS — BP 174/102 | HR 93 | Ht 66.0 in | Wt 201.1 lb

## 2019-10-20 DIAGNOSIS — I2510 Atherosclerotic heart disease of native coronary artery without angina pectoris: Secondary | ICD-10-CM

## 2019-10-20 DIAGNOSIS — E7800 Pure hypercholesterolemia, unspecified: Secondary | ICD-10-CM

## 2019-10-20 DIAGNOSIS — I10 Essential (primary) hypertension: Secondary | ICD-10-CM

## 2019-10-20 MED ORDER — LABETALOL HCL 300 MG PO TABS: 600.0000 mg | ORAL_TABLET | Freq: Two times a day (BID) | ORAL | 3 refills | Status: DC

## 2019-10-20 NOTE — Patient Instructions (Signed)
Medication Instructions:  INCREASE LABETALOL TO 600 MG TWICE DAILY= 2 OF THE 300 MG TABLETS TWICE DAILY  *If you need a refill on your cardiac medications before your next appointment, please call your pharmacy*  Lab Work: Your physician recommends that you return for lab work [place]  If you have labs (blood work) drawn today and your tests are completely normal, you will receive your results only by: [name] MyChart Message (if you have MyChart) OR . A paper copy in the mail If you have any lab test that is abnormal or we need to change your treatment, we will call you to review the results.  Follow-Up: At [facility], you and your health needs are our priority.  As part of our continuing mission to provide you with exceptional heart care, we have created designated Provider Care Teams.  These Care Teams include your primary Cardiologist (physician) and Advanced Practice Providers (APPs -  Physician Assistants and Nurse Practitioners) who all work together to provide you with the care you need, when you need it.  Your next appointment:   6 month(s)  The format for your next appointment:   Either In Person or Virtual  Provider:   Kirk Ruths, MD

## 2019-11-09 ENCOUNTER — Other Ambulatory Visit: Payer: Self-pay | Admitting: Cardiology

## 2019-11-09 ENCOUNTER — Other Ambulatory Visit: Payer: Self-pay | Admitting: Family Medicine

## 2019-11-09 DIAGNOSIS — I10 Essential (primary) hypertension: Secondary | ICD-10-CM

## 2019-11-09 NOTE — Telephone Encounter (Signed)
Rx has been sent to the pharmacy electronically. ° °

## 2019-11-09 NOTE — Telephone Encounter (Signed)
To PCP

## 2019-11-29 ENCOUNTER — Encounter: Payer: Self-pay | Admitting: *Deleted

## 2019-12-03 DIAGNOSIS — E7800 Pure hypercholesterolemia, unspecified: Secondary | ICD-10-CM | POA: Diagnosis not present

## 2019-12-03 DIAGNOSIS — I781 Nevus, non-neoplastic: Secondary | ICD-10-CM | POA: Diagnosis not present

## 2019-12-03 DIAGNOSIS — I10 Essential (primary) hypertension: Secondary | ICD-10-CM | POA: Diagnosis not present

## 2019-12-03 DIAGNOSIS — M79604 Pain in right leg: Secondary | ICD-10-CM | POA: Diagnosis not present

## 2019-12-04 LAB — COMPREHENSIVE METABOLIC PANEL
AG Ratio: 2 (calc) (ref 1.0–2.5)
ALT: 9 U/L (ref 6–29)
AST: 14 U/L (ref 10–35)
Albumin: 4 g/dL (ref 3.6–5.1)
Alkaline phosphatase (APISO): 90 U/L (ref 37–153)
BUN: 11 mg/dL (ref 7–25)
CO2: 26 mmol/L (ref 20–32)
Calcium: 9.4 mg/dL (ref 8.6–10.4)
Chloride: 109 mmol/L (ref 98–110)
Creat: 0.93 mg/dL (ref 0.50–0.99)
Globulin: 2 g/dL (calc) (ref 1.9–3.7)
Glucose, Bld: 113 mg/dL — ABNORMAL HIGH (ref 65–99)
Potassium: 3.9 mmol/L (ref 3.5–5.3)
Sodium: 142 mmol/L (ref 135–146)
Total Bilirubin: 0.7 mg/dL (ref 0.2–1.2)
Total Protein: 6 g/dL — ABNORMAL LOW (ref 6.1–8.1)

## 2019-12-04 LAB — LIPID PANEL
Cholesterol: 180 mg/dL (ref <200)
HDL: 49 mg/dL — ABNORMAL LOW (ref >=50)
LDL Cholesterol (Calc): 110 mg/dL (calc) — ABNORMAL HIGH
Non-HDL Cholesterol (Calc): 131 mg/dL (calc) — ABNORMAL HIGH (ref <130)
Total CHOL/HDL Ratio: 3.7 (calc) (ref <5.0)
Triglycerides: 103 mg/dL (ref <150)

## 2019-12-16 DIAGNOSIS — I83811 Varicose veins of right lower extremities with pain: Secondary | ICD-10-CM | POA: Diagnosis not present

## 2019-12-22 ENCOUNTER — Other Ambulatory Visit: Payer: Self-pay | Admitting: Cardiology

## 2019-12-22 MED ORDER — ATORVASTATIN CALCIUM 80 MG PO TABS: 80.0000 mg | ORAL_TABLET | Freq: Every day | ORAL | 11 refills | Status: DC

## 2019-12-22 NOTE — Telephone Encounter (Signed)
Refills sent as requested  ./cy °

## 2019-12-22 NOTE — Telephone Encounter (Signed)
New Message   *STAT* If patient is at the pharmacy, call can be transferred to refill team.   1. Which medications need to be refilled? (please list name of each medication and dose if known) atorvastatin (LIPITOR) 80 MG tablet  2. Which pharmacy/location (including street and city if local pharmacy) is medication to be sent to? [place], [place]  3. Do they need a 30 day or 90 day supply? 30 day

## 2020-01-14 ENCOUNTER — Other Ambulatory Visit: Payer: Self-pay | Admitting: Family Medicine

## 2020-01-21 ENCOUNTER — Telehealth: Payer: Self-pay

## 2020-01-21 DIAGNOSIS — M25562 Pain in left knee: Secondary | ICD-10-CM

## 2020-01-21 NOTE — Telephone Encounter (Signed)
Pharmacy is requesting a refill on Diclofenac. Dr Georgina Snell prescribed this medication.   It will also need a Prior Authorization.

## 2020-01-24 MED ORDER — DICLOFENAC SODIUM 1 % EX GEL: 4.0000 g | Freq: Four times a day (QID) | CUTANEOUS | 99 refills | Status: DC

## 2020-01-24 NOTE — Telephone Encounter (Signed)
Prescription sent to [place].  Was not sure where she wanted it sent.  Please initiate prior authorization.

## 2020-01-27 DIAGNOSIS — I83811 Varicose veins of right lower extremities with pain: Secondary | ICD-10-CM | POA: Diagnosis not present

## 2020-01-27 NOTE — Telephone Encounter (Signed)
Received message for PA on Diclofenac sent through cover my meds waiting on determination. - CF

## 2020-02-04 DIAGNOSIS — Z23 Encounter for immunization: Secondary | ICD-10-CM | POA: Diagnosis not present

## 2020-02-11 ENCOUNTER — Other Ambulatory Visit: Payer: Self-pay | Admitting: Cardiology

## 2020-02-14 NOTE — Telephone Encounter (Signed)
Checked on PA through cover my meds and received the following message:  Your PA request has been closed. Thank you for your ePA request for Diclofenac Gel 1%. The quantity requested does not require prior authorization, as it does not exceed the standard allowance of 1000gm every 90 days. If a quantity greater than this is required, please contact us again. Thank you

## 2020-02-16 NOTE — Telephone Encounter (Signed)
Okay sounds good. Please notify pt/pharmacy

## 2020-03-02 DIAGNOSIS — I83811 Varicose veins of right lower extremities with pain: Secondary | ICD-10-CM | POA: Diagnosis not present

## 2020-03-19 ENCOUNTER — Other Ambulatory Visit: Payer: Self-pay | Admitting: Family Medicine

## 2020-03-24 ENCOUNTER — Other Ambulatory Visit: Payer: Self-pay | Admitting: Cardiology

## 2020-05-07 ENCOUNTER — Other Ambulatory Visit: Payer: Self-pay | Admitting: Family Medicine

## 2020-05-23 ENCOUNTER — Encounter: Payer: Self-pay | Admitting: Family Medicine

## 2020-05-23 ENCOUNTER — Ambulatory Visit (INDEPENDENT_AMBULATORY_CARE_PROVIDER_SITE_OTHER): Payer: Medicare Other | Admitting: Family Medicine

## 2020-05-23 VITALS — BP 157/83 | HR 70 | Ht 66.0 in | Wt 203.0 lb

## 2020-05-23 DIAGNOSIS — L918 Other hypertrophic disorders of the skin: Secondary | ICD-10-CM | POA: Diagnosis not present

## 2020-05-23 DIAGNOSIS — I10 Essential (primary) hypertension: Secondary | ICD-10-CM

## 2020-05-23 DIAGNOSIS — I2510 Atherosclerotic heart disease of native coronary artery without angina pectoris: Secondary | ICD-10-CM

## 2020-05-23 DIAGNOSIS — M5136 Other intervertebral disc degeneration, lumbar region: Secondary | ICD-10-CM | POA: Diagnosis not present

## 2020-05-23 DIAGNOSIS — F3160 Bipolar disorder, current episode mixed, unspecified: Secondary | ICD-10-CM | POA: Diagnosis not present

## 2020-05-23 DIAGNOSIS — E611 Iron deficiency: Secondary | ICD-10-CM | POA: Insufficient documentation

## 2020-05-23 DIAGNOSIS — I509 Heart failure, unspecified: Secondary | ICD-10-CM | POA: Diagnosis not present

## 2020-05-23 DIAGNOSIS — M5412 Radiculopathy, cervical region: Secondary | ICD-10-CM

## 2020-05-23 DIAGNOSIS — L723 Sebaceous cyst: Secondary | ICD-10-CM

## 2020-05-23 MED ORDER — BUSPIRONE HCL 10 MG PO TABS: 10.0000 mg | ORAL_TABLET | Freq: Three times a day (TID) | ORAL | 5 refills | Status: DC

## 2020-05-23 NOTE — Assessment & Plan Note (Signed)
Pressure elevated today repeat was even higher.  Does have follow-up with cardiology tomorrow so I will let them make adjustments to her medication regimen just encouraged her to take her medicines regularly.

## 2020-05-23 NOTE — Assessment & Plan Note (Signed)
Continue Cymbalta.

## 2020-05-23 NOTE — Assessment & Plan Note (Signed)
She is taking her iron supplement but says she is still craving ice.  Will recheck ferritin.

## 2020-05-23 NOTE — Assessment & Plan Note (Addendum)
She denies any increase LE swelling.  No sign of volume overload today.  NO SOB.

## 2020-05-23 NOTE — Assessment & Plan Note (Signed)
Doing well on her Cymbalta.  We will go ahead and refill medication when she is due in November

## 2020-05-23 NOTE — Assessment & Plan Note (Signed)
PHQ-9 score of 1 and GAD-7 of 0.  Though she did want to try to increase her BuSpar so I did increase that from twice a day to 3 times a day.  New prescription sent to mail order.  Follow-up in 6 months.

## 2020-05-23 NOTE — Progress Notes (Signed)
Pt is here for refills on the following medications:  Buspar 10 mg, Hydrazine 100 mg, amitriptyline 10 mg, and Duloxetine 60 mg

## 2020-05-23 NOTE — Progress Notes (Addendum)
Established Patient Office Visit  Subjective:  Patient ID: [name], female    DOB: [birth year]  Age: 64 y.o. MRN: [id number]  CC:  Chief Complaint  Patient presents with  . Hypertension    HPI [name] presents for follow-up.  She actually saw Dr. Kirk Ruths about 7 months ago and actually has her follow-up tomorrow with him they did do blood work at that time. Says her BPs have been up and down at hom as well.  Deneis any LE swelling recently.    Follow-up mood-overall she is actually been doing well but she has had a lot of stress at home.  She would like to increase her BuSpar. Increased stress/caregiver burden-she is the primary caretaker for her husband.  Is very quickly advancing Lewy body dementia and Parkinson's.   Cervical radiculopathy currently on Cymbalta and gabapentin though I do not write her gabapentin.  She is not sure how much the gabapentin is really helping but does feel like the Cymbalta is helpful.  She has a couple of skin lesions she would like me to look at. She has a large skin tag in the left axilla and a cyst on the right side of her neck. She also has a liner lesion on her left upper chest.  They are not bothersome or painful.  Has had to have a cyst surgically removed from the lest axilla before.    Past Medical History:  Diagnosis Date  . Allergy   . Anemia   . Anxiety   . Bipolar disorder ([place])   . CHF (congestive heart failure) ([place])   . Chronic diastolic heart failure (HCC)    hospitalized at [facility] regional  . Complication of anesthesia    had "breathing problems and ended up in ICU" 03/2010 report on chart  . COPD (chronic obstructive pulmonary disease) ([place])   . DDD (degenerative disc disease), lumbar   . Depression   . 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 GERD (gastroesophageal reflux disease)   . Glucose intolerance (impaired glucose tolerance)   . HLD (hyperlipidemia)    nec/nos  . HTN (hypertension)    benign  essential  . MVP (mitral valve prolapse)   . Narcolepsy   . Stroke ([place])   . Suicide attempt ([place])    hx  . Syncope    admx to [facility] in 2/12: Echo with normal LVF; head CT unremarkable, ECG stable, no further w/u  . Ulcer     Past Surgical History:  Procedure Laterality Date  . AUGMENTATION MAMMAPLASTY  2003  . BLADDER SUSPENSION  2002  . DENTAL SURGERY  2016   Teeth implants x 4  . GASTRIC BYPASS  2014   at [place]    . LUMBAR LAMINECTOMY/DECOMPRESSION MICRODISCECTOMY  09/11/2011   Procedure: LUMBAR LAMINECTOMY/DECOMPRESSION MICRODISCECTOMY;  Surgeon: Tobi Bastos;  Location: [facility];  Service: Orthopedics;  Laterality: Right;  Hemi Laminectomy/Microdiscectomy L5 - S1 on the Right (X-Ray)  . PARTIAL HYSTERECTOMY  1979  . REPAIR PERONEAL TENDONS ANKLE Left 2008   after injury     Family History  Problem Relation Age of Onset  . Heart attack Father 53  . Hypertension Father   . Diabetes Mother   . Hypertension Mother   . Stroke Other        grandmother  . Colon cancer Neg Hx   . Esophageal cancer Neg Hx   .  Stomach cancer Neg Hx   . Rectal cancer Neg Hx     Social History   Socioeconomic History  . Marital status: Married    Spouse name: Not on file  . Number of children: Not on file  . Years of education: Not on file  . Highest education level: Not on file  Occupational History  . Not on file  Tobacco Use  . Smoking status: Former Smoker    Quit date: 10/08/2003    Years since quitting: 16.6  . Smokeless tobacco: Never Used  Vaping Use  . Vaping Use: Never used  Substance and Sexual Activity  . Alcohol use: Yes    Comment: rare  . Drug use: No  . Sexual activity: Not on file  Other Topics Concern  . Not on file  Social History Narrative   Regularly exercises.    Social Determinants of Health   Financial Resource Strain:   . Difficulty of Paying Living Expenses:   Food Insecurity:   . Worried About  Charity fundraiser in the Last Year:   . Arboriculturist in the Last Year:   Transportation Needs:   . Film/video editor (Medical):   [name] Lack of Transportation (Non-Medical):   Physical Activity:   . Days of Exercise per Week:   . Minutes of Exercise per Session:   Stress:   . Feeling of Stress :   Social Connections:   . Frequency of Communication with Friends and Family:   . Frequency of Social Gatherings with Friends and Family:   . Attends Religious Services:   . Active Member of Clubs or Organizations:   . Attends Archivist Meetings:   [name] Marital Status:   Intimate Partner Violence:   . Fear of Current or Ex-Partner:   . Emotionally Abused:   [name] Physically Abused:   . Sexually Abused:     Outpatient Medications Prior to Visit  Medication Sig Dispense Refill  . gabapentin (NEURONTIN) 800 MG tablet Take 800 mg by mouth 3 (three) times daily.    [name] amitriptyline (ELAVIL) 10 MG tablet Take 2 tablets by mouth every evening 60 tablet 11  . amLODipine-olmesartan (AZOR) 10-40 MG tablet Take 1 tablet by mouth every day 30 tablet 11  . aspirin 81 MG EC tablet Take 1 tablet by mouth every day 30 tablet 11  . atorvastatin (LIPITOR) 80 MG tablet Take 1 tablet (80 mg total) by mouth at bedtime. 30 tablet 11  . cloNIDine (CATAPRES) 0.2 MG tablet TAKE TWO TABLETS BY MOUTH 3 TIMES A DAY 180 tablet 11  . diclofenac Sodium (VOLTAREN) 1 % GEL Apply 4 g topically 4 (four) times daily. 350 g PRN  . DULoxetine (CYMBALTA) 60 MG capsule Take 1 capsule by mouth every evening 30 capsule 11  . esomeprazole (NEXIUM) 40 MG capsule Take 40 mg by mouth 2 (two) times daily as needed. 1 capsule daily scheduled, will take an additional capsule if needed    . finasteride (PROPECIA) 1 MG tablet Take 1 tablet by mouth once daily 30 tablet 0  . folic acid (FOLVITE) 1 MG tablet Take 1 mg by mouth daily.     . furosemide (LASIX) 40 MG tablet Take 1 tablet by mouth twice daily 60 tablet 5  .  hydrALAZINE (APRESOLINE) 100 MG tablet Take 1 tablet by mouth 3 times a day 90 tablet 5  . labetalol (NORMODYNE) 300 MG tablet Take 2 tablets (600 mg total) by mouth  2 (two) times daily. 360 tablet 3  . lidocaine (XYLOCAINE) 5 % ointment Apply 1 application topically as needed.    [name] MINOXIDIL, TOPICAL, 5 % SOLN Apply 1 application topically daily. 120 mL PRN  . Multiple Vitamins-Minerals (MULTIVITAMINS THER. W/MINERALS) TABS Take 1 tablet by mouth daily.      [name] spironolactone (ALDACTONE) 50 MG tablet Take 1 tablet by mouth every day 30 tablet 11  . VENTOLIN HFA 108 (90 BASE) MCG/ACT inhaler prn    . busPIRone (BUSPAR) 10 MG tablet Take 1 tablet (10 mg total) by mouth 2 (two) times daily. [place].PLEASE CALL THE OFFICE TO SCHEDULE AN APPOINTMENT FOR REFILLS 60 tablet 0  . gabapentin (NEURONTIN) 300 MG capsule Take 4 capsules (1,200 mg total) by mouth 3 (three) times daily. 1080 capsule 3   No facility-administered medications prior to visit.    Allergies  Allergen Reactions  . Codeine Nausea And Vomiting    ROS Review of Systems    Objective:    Physical Exam Constitutional:      Appearance: She is well-developed.  HENT:     Head: Normocephalic and atraumatic.  Neck:     Comments: No carotid bruits Cardiovascular:     Rate and Rhythm: Normal rate and regular rhythm.     Heart sounds: Normal heart sounds.  Pulmonary:     Effort: Pulmonary effort is normal.     Breath sounds: Normal breath sounds.  Skin:    General: Skin is warm and dry.     Comments: Large skin tag under her left axilla.  approx 1 cm seb cyst cyst above the edge of right clavicle.   Neurological:     Mental Status: She is alert and oriented to person, place, and time.  Psychiatric:        Behavior: Behavior normal.     BP (!) 157/83   Pulse 70   Ht 5\' 6"  (1.676 m)   Wt 203 lb (92.1 kg)   SpO2 100%   BMI 32.77 kg/m  Wt Readings from Last 3 Encounters:  05/23/20 203 lb (92.1 kg)   10/20/19 201 lb 1.9 oz (91.2 kg)  07/26/19 201 lb (91.2 kg)     There are no preventive care reminders to display for this patient.  There are no preventive care reminders to display for this patient.  Lab Results  Component Value Date   TSH 0.75 04/14/2017   Lab Results  Component Value Date   WBC 8.9 02/11/2018   HGB 14.0 02/11/2018   HCT 41.5 02/11/2018   MCV 82.2 02/11/2018   PLT 254 02/11/2018   Lab Results  Component Value Date   NA 142 12/03/2019   K 3.9 12/03/2019   CO2 26 12/03/2019   GLUCOSE 113 (H) 12/03/2019   BUN 11 12/03/2019   CREATININE 0.93 12/03/2019   BILITOT 0.7 12/03/2019   ALKPHOS 99 04/14/2017   AST 14 12/03/2019   ALT 9 12/03/2019   PROT 6.0 (L) 12/03/2019   ALBUMIN 4.1 04/14/2017   CALCIUM 9.4 12/03/2019   GFR 103.47 08/08/2011   Lab Results  Component Value Date   CHOL 180 12/03/2019   Lab Results  Component Value Date   HDL 49 (L) 12/03/2019   Lab Results  Component Value Date   LDLCALC 110 (H) 12/03/2019   Lab Results  Component Value Date   TRIG 103 12/03/2019   Lab Results  Component Value Date   CHOLHDL 3.7 12/03/2019  Lab Results  Component Value Date   HGBA1C 5.6 04/14/2017      Assessment & Plan:   Problem List Items Addressed This Visit      Cardiovascular and Mediastinum   Essential hypertension, malignant - Primary    Pressure elevated today repeat was even higher.  Does have follow-up with cardiology tomorrow so I will let them make adjustments to her medication regimen just encouraged her to take her medicines regularly.      Relevant Orders   BASIC METABOLIC PANEL WITH GFR   Congestive heart failure ([place])    She denies any increase LE swelling.  No sign of volume overload today.  NO SOB.         Nervous and Auditory   Right cervical radiculopathy    Doing well on her Cymbalta.  We will go ahead and refill medication when she is due in November      Relevant Medications   gabapentin  (NEURONTIN) 800 MG tablet   busPIRone (BUSPAR) 10 MG tablet     Musculoskeletal and Integument   DDD (degenerative disc disease), lumbar    Continue Cymbalta.         Other   Iron deficiency    She is taking her iron supplement but says she is still craving ice.  Will recheck ferritin.      Relevant Orders   Ferritin   CBC   BIPOLAR DISORDER UNSPECIFIED    PHQ-9 score of 1 and GAD-7 of 0.  Though she did want to try to increase her BuSpar so I did increase that from twice a day to 3 times a day.  New prescription sent to mail order.  Follow-up in 6 months.       Other Visit Diagnoses    Skin tag       Sebaceous cyst       Relevant Orders   Ambulatory referral to Dermatology      Skin tag - discussed removal. She can schedule at any time  Sebaceous cyst on the right side of the next. Refer to Dermatology for removal.    Linear scar - lesion on the left upper chest most consistent with a scar.    Meds ordered this encounter  Medications  . busPIRone (BUSPAR) 10 MG tablet    Sig: Take 1 tablet (10 mg total) by mouth 3 (three) times daily.    Dispense:  90 tablet    Refill:  5    Follow-up: Return in about 6 months (around 11/23/2020) for Hypertension and Mood.   Time spent 32 min in encounter   Beatrice Lecher, MD

## 2020-05-24 ENCOUNTER — Ambulatory Visit: Payer: Medicare Other | Admitting: Cardiology

## 2020-05-24 ENCOUNTER — Other Ambulatory Visit: Payer: Self-pay | Admitting: Family Medicine

## 2020-06-17 ENCOUNTER — Other Ambulatory Visit: Payer: Self-pay | Admitting: Family Medicine

## 2020-06-25 ENCOUNTER — Other Ambulatory Visit: Payer: Self-pay | Admitting: Sports Medicine

## 2020-06-25 ENCOUNTER — Other Ambulatory Visit: Payer: Self-pay | Admitting: Family Medicine

## 2020-06-26 NOTE — Telephone Encounter (Signed)
I have not seen this patient in over a year, PCP may feel comfortable refilling this.

## 2020-07-25 ENCOUNTER — Other Ambulatory Visit: Payer: Self-pay | Admitting: Family Medicine

## 2020-07-28 ENCOUNTER — Other Ambulatory Visit: Payer: Self-pay | Admitting: Family Medicine

## 2020-07-28 ENCOUNTER — Other Ambulatory Visit: Payer: Self-pay | Admitting: Cardiology

## 2020-08-05 ENCOUNTER — Other Ambulatory Visit: Payer: Self-pay | Admitting: Family Medicine

## 2020-08-09 ENCOUNTER — Ambulatory Visit: Payer: Medicare Other | Admitting: Cardiology

## 2020-08-09 DIAGNOSIS — H5213 Myopia, bilateral: Secondary | ICD-10-CM | POA: Diagnosis not present

## 2020-08-09 DIAGNOSIS — H524 Presbyopia: Secondary | ICD-10-CM | POA: Diagnosis not present

## 2020-08-09 DIAGNOSIS — H04123 Dry eye syndrome of bilateral lacrimal glands: Secondary | ICD-10-CM | POA: Diagnosis not present

## 2020-08-09 DIAGNOSIS — H2513 Age-related nuclear cataract, bilateral: Secondary | ICD-10-CM | POA: Diagnosis not present

## 2020-08-20 ENCOUNTER — Other Ambulatory Visit: Payer: Self-pay | Admitting: Cardiology

## 2020-09-04 ENCOUNTER — Other Ambulatory Visit: Payer: Self-pay | Admitting: Cardiology

## 2020-09-04 ENCOUNTER — Other Ambulatory Visit: Payer: Self-pay | Admitting: Family Medicine

## 2020-09-09 ENCOUNTER — Other Ambulatory Visit: Payer: Self-pay | Admitting: Cardiology

## 2020-09-09 DIAGNOSIS — I10 Essential (primary) hypertension: Secondary | ICD-10-CM

## 2020-09-11 DIAGNOSIS — M65872 Other synovitis and tenosynovitis, left ankle and foot: Secondary | ICD-10-CM | POA: Diagnosis not present

## 2020-09-11 DIAGNOSIS — D2372 Other benign neoplasm of skin of left lower limb, including hip: Secondary | ICD-10-CM | POA: Diagnosis not present

## 2020-09-11 DIAGNOSIS — M65871 Other synovitis and tenosynovitis, right ankle and foot: Secondary | ICD-10-CM | POA: Diagnosis not present

## 2020-09-25 DIAGNOSIS — D2372 Other benign neoplasm of skin of left lower limb, including hip: Secondary | ICD-10-CM | POA: Diagnosis not present

## 2020-09-25 DIAGNOSIS — M2041 Other hammer toe(s) (acquired), right foot: Secondary | ICD-10-CM | POA: Diagnosis not present

## 2020-09-25 DIAGNOSIS — M2042 Other hammer toe(s) (acquired), left foot: Secondary | ICD-10-CM | POA: Diagnosis not present

## 2020-10-04 ENCOUNTER — Other Ambulatory Visit: Payer: Self-pay | Admitting: Cardiology

## 2020-10-22 ENCOUNTER — Other Ambulatory Visit: Payer: Self-pay | Admitting: Cardiology

## 2020-11-21 DIAGNOSIS — R531 Weakness: Secondary | ICD-10-CM | POA: Diagnosis not present

## 2020-11-21 DIAGNOSIS — I639 Cerebral infarction, unspecified: Secondary | ICD-10-CM | POA: Diagnosis not present

## 2020-11-21 DIAGNOSIS — R001 Bradycardia, unspecified: Secondary | ICD-10-CM | POA: Diagnosis not present

## 2020-11-21 DIAGNOSIS — R270 Ataxia, unspecified: Secondary | ICD-10-CM | POA: Diagnosis not present

## 2020-11-21 DIAGNOSIS — I2510 Atherosclerotic heart disease of native coronary artery without angina pectoris: Secondary | ICD-10-CM | POA: Diagnosis not present

## 2020-11-21 DIAGNOSIS — R29818 Other symptoms and signs involving the nervous system: Secondary | ICD-10-CM | POA: Diagnosis not present

## 2020-11-21 DIAGNOSIS — I6389 Other cerebral infarction: Secondary | ICD-10-CM | POA: Diagnosis not present

## 2020-11-21 DIAGNOSIS — E669 Obesity, unspecified: Secondary | ICD-10-CM | POA: Diagnosis not present

## 2020-11-21 DIAGNOSIS — I509 Heart failure, unspecified: Secondary | ICD-10-CM | POA: Diagnosis not present

## 2020-11-21 DIAGNOSIS — E785 Hyperlipidemia, unspecified: Secondary | ICD-10-CM | POA: Diagnosis not present

## 2020-11-21 DIAGNOSIS — I10 Essential (primary) hypertension: Secondary | ICD-10-CM | POA: Diagnosis not present

## 2020-11-21 DIAGNOSIS — G319 Degenerative disease of nervous system, unspecified: Secondary | ICD-10-CM | POA: Diagnosis not present

## 2020-11-21 DIAGNOSIS — R9089 Other abnormal findings on diagnostic imaging of central nervous system: Secondary | ICD-10-CM | POA: Diagnosis not present

## 2020-11-21 DIAGNOSIS — I110 Hypertensive heart disease with heart failure: Secondary | ICD-10-CM | POA: Diagnosis not present

## 2020-11-21 DIAGNOSIS — F319 Bipolar disorder, unspecified: Secondary | ICD-10-CM | POA: Diagnosis not present

## 2020-11-22 DIAGNOSIS — R059 Cough, unspecified: Secondary | ICD-10-CM | POA: Diagnosis not present

## 2020-11-22 DIAGNOSIS — R1310 Dysphagia, unspecified: Secondary | ICD-10-CM | POA: Diagnosis not present

## 2020-11-22 DIAGNOSIS — I639 Cerebral infarction, unspecified: Secondary | ICD-10-CM | POA: Diagnosis not present

## 2020-11-22 DIAGNOSIS — I6389 Other cerebral infarction: Secondary | ICD-10-CM | POA: Diagnosis present

## 2020-11-22 DIAGNOSIS — I2510 Atherosclerotic heart disease of native coronary artery without angina pectoris: Secondary | ICD-10-CM | POA: Diagnosis present

## 2020-11-22 DIAGNOSIS — E669 Obesity, unspecified: Secondary | ICD-10-CM | POA: Diagnosis not present

## 2020-11-22 DIAGNOSIS — E7800 Pure hypercholesterolemia, unspecified: Secondary | ICD-10-CM | POA: Diagnosis present

## 2020-11-22 DIAGNOSIS — Z6835 Body mass index (BMI) 35.0-35.9, adult: Secondary | ICD-10-CM | POA: Diagnosis not present

## 2020-11-22 DIAGNOSIS — I10 Essential (primary) hypertension: Secondary | ICD-10-CM | POA: Diagnosis not present

## 2020-11-22 DIAGNOSIS — F319 Bipolar disorder, unspecified: Secondary | ICD-10-CM | POA: Diagnosis present

## 2020-11-22 DIAGNOSIS — I517 Cardiomegaly: Secondary | ICD-10-CM | POA: Diagnosis not present

## 2020-11-22 DIAGNOSIS — I509 Heart failure, unspecified: Secondary | ICD-10-CM | POA: Diagnosis present

## 2020-11-22 DIAGNOSIS — I340 Nonrheumatic mitral (valve) insufficiency: Secondary | ICD-10-CM | POA: Diagnosis not present

## 2020-11-22 DIAGNOSIS — E785 Hyperlipidemia, unspecified: Secondary | ICD-10-CM | POA: Diagnosis present

## 2020-11-22 DIAGNOSIS — I110 Hypertensive heart disease with heart failure: Secondary | ICD-10-CM | POA: Diagnosis present

## 2020-11-24 ENCOUNTER — Other Ambulatory Visit: Payer: Self-pay | Admitting: Family Medicine

## 2020-11-24 DIAGNOSIS — M25562 Pain in left knee: Secondary | ICD-10-CM

## 2020-11-25 DIAGNOSIS — M25562 Pain in left knee: Secondary | ICD-10-CM | POA: Diagnosis not present

## 2020-11-25 DIAGNOSIS — G4700 Insomnia, unspecified: Secondary | ICD-10-CM | POA: Diagnosis not present

## 2020-11-25 DIAGNOSIS — I69391 Dysphagia following cerebral infarction: Secondary | ICD-10-CM | POA: Diagnosis not present

## 2020-11-25 DIAGNOSIS — I69398 Other sequelae of cerebral infarction: Secondary | ICD-10-CM | POA: Diagnosis not present

## 2020-11-25 DIAGNOSIS — I69318 Other symptoms and signs involving cognitive functions following cerebral infarction: Secondary | ICD-10-CM | POA: Diagnosis not present

## 2020-11-25 DIAGNOSIS — R2689 Other abnormalities of gait and mobility: Secondary | ICD-10-CM | POA: Diagnosis not present

## 2020-11-25 DIAGNOSIS — I2510 Atherosclerotic heart disease of native coronary artery without angina pectoris: Secondary | ICD-10-CM | POA: Diagnosis not present

## 2020-11-25 DIAGNOSIS — I5030 Unspecified diastolic (congestive) heart failure: Secondary | ICD-10-CM | POA: Diagnosis not present

## 2020-11-25 DIAGNOSIS — I509 Heart failure, unspecified: Secondary | ICD-10-CM | POA: Diagnosis not present

## 2020-11-25 DIAGNOSIS — E669 Obesity, unspecified: Secondary | ICD-10-CM | POA: Diagnosis not present

## 2020-11-25 DIAGNOSIS — Z7409 Other reduced mobility: Secondary | ICD-10-CM | POA: Diagnosis not present

## 2020-11-25 DIAGNOSIS — R1310 Dysphagia, unspecified: Secondary | ICD-10-CM | POA: Diagnosis not present

## 2020-11-25 DIAGNOSIS — M1712 Unilateral primary osteoarthritis, left knee: Secondary | ICD-10-CM | POA: Diagnosis not present

## 2020-11-25 DIAGNOSIS — F319 Bipolar disorder, unspecified: Secondary | ICD-10-CM | POA: Diagnosis not present

## 2020-11-25 DIAGNOSIS — I69321 Dysphasia following cerebral infarction: Secondary | ICD-10-CM | POA: Diagnosis not present

## 2020-11-25 DIAGNOSIS — I110 Hypertensive heart disease with heart failure: Secondary | ICD-10-CM | POA: Diagnosis not present

## 2020-11-25 DIAGNOSIS — R1311 Dysphagia, oral phase: Secondary | ICD-10-CM | POA: Diagnosis not present

## 2020-11-25 DIAGNOSIS — I639 Cerebral infarction, unspecified: Secondary | ICD-10-CM | POA: Diagnosis not present

## 2020-11-25 DIAGNOSIS — I10 Essential (primary) hypertension: Secondary | ICD-10-CM | POA: Diagnosis not present

## 2020-11-25 DIAGNOSIS — K219 Gastro-esophageal reflux disease without esophagitis: Secondary | ICD-10-CM | POA: Diagnosis not present

## 2020-12-03 ENCOUNTER — Other Ambulatory Visit: Payer: Self-pay | Admitting: Family Medicine

## 2020-12-03 ENCOUNTER — Other Ambulatory Visit: Payer: Self-pay | Admitting: Cardiology

## 2020-12-04 ENCOUNTER — Telehealth: Payer: Self-pay

## 2020-12-04 ENCOUNTER — Ambulatory Visit (INDEPENDENT_AMBULATORY_CARE_PROVIDER_SITE_OTHER): Payer: Federal, State, Local not specified - PPO | Admitting: Rehabilitative and Restorative Service Providers"

## 2020-12-04 ENCOUNTER — Other Ambulatory Visit: Payer: Self-pay

## 2020-12-04 DIAGNOSIS — R29818 Other symptoms and signs involving the nervous system: Secondary | ICD-10-CM

## 2020-12-04 DIAGNOSIS — M6281 Muscle weakness (generalized): Secondary | ICD-10-CM

## 2020-12-04 DIAGNOSIS — R2689 Other abnormalities of gait and mobility: Secondary | ICD-10-CM

## 2020-12-04 DIAGNOSIS — R2681 Unsteadiness on feet: Secondary | ICD-10-CM

## 2020-12-04 NOTE — Therapy (Signed)
[place], [place], [address] Phone: [phone]   Fax:  [phone]  Physical Therapy--Deferred PT evaluation  Patient Details  Name: [name] MRN: [id number] Date of Birth: [birth year] No data recorded  Encounter Date: 12/04/2020    Past Medical History:  Diagnosis Date  . Allergy   . Anemia   . Anxiety   . Bipolar disorder ([place])   . CHF (congestive heart failure) ([place])   . Chronic diastolic heart failure (HCC)    hospitalized at [facility] regional  . Complication of anesthesia    had "breathing problems and ended up in ICU" 03/2010 report on chart  . COPD (chronic obstructive pulmonary disease) ([place])   . DDD (degenerative disc disease), lumbar   . Depression   . 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 GERD (gastroesophageal reflux disease)   . Glucose intolerance (impaired glucose tolerance)   . HLD (hyperlipidemia)    nec/nos  . HTN (hypertension)    benign essential  . MVP (mitral valve prolapse)   . Narcolepsy   . Stroke ([place])   . Suicide attempt ([place])    hx  . Syncope    admx to [facility] in 2/12: Echo with normal LVF; head CT unremarkable, ECG stable, no further w/u  . Ulcer     Past Surgical History:  Procedure Laterality Date  . AUGMENTATION MAMMAPLASTY  2003  . BLADDER SUSPENSION  2002  . DENTAL SURGERY  2016   Teeth implants x 4  . GASTRIC BYPASS  2014   at [place]    . LUMBAR LAMINECTOMY/DECOMPRESSION MICRODISCECTOMY  09/11/2011   Procedure: LUMBAR LAMINECTOMY/DECOMPRESSION MICRODISCECTOMY;  Surgeon: Tobi Bastos;  Location: [facility];  Service: Orthopedics;  Laterality: Right;  Hemi Laminectomy/Microdiscectomy L5 - S1 on the Right (X-Ray)  . PARTIAL HYSTERECTOMY  1979  . REPAIR PERONEAL TENDONS ANKLE Left 2008   after injury     There were no vitals filed for this visit.   Subjective Assessment - 12/04/20 1009     Subjective The patient arrives today for PT visit reporting nausea and not able to tolerate evaluation today.  PT checked her BP, which was 136/82.  Patient declined resting before the eval to see if she could tolerate activity today.  PT accompanied patient to the car.  She was walking iwth min A with SPC noting her walker had not yet been delivered.                                                  Patient will benefit from skilled therapeutic intervention in order to improve the following deficits and impairments:     Visit Diagnosis: Other abnormalities of gait and mobility  Muscle weakness (generalized)  Unsteadiness on feet  Other symptoms and signs involving the nervous system     Problem List Patient Active Problem List   Diagnosis Date Noted  . Iron deficiency 05/23/2020  . Post herpetic neuralgia 02/09/2019  . 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 Caregiver burden 02/10/2018  . Mitral valve prolapse syndrome 08/15/2016  . Congestive heart failure ([place]) 08/15/2016  . Right cervical radiculopathy 08/09/2016  . Essential hypertension, malignant 06/12/2016  . History of CVA (cerebrovascular accident) 03/13/2016  . Insomnia due to mental  disorder 03/13/2013  . Narcolepsy 03/13/2013  . Coronary atherosclerosis 03/13/2013  . DDD (degenerative disc disease), lumbar   . HIRSUTISM 06/02/2009  . BIPOLAR DISORDER UNSPECIFIED 05/17/2009  . Pain in limb 08/03/2008  . Hyperlipidemia 07/15/2007  . FATIGUE 07/15/2007    [name] 12/04/2020, 10:10 AM  [facility] [place], [place], [address] Phone: [phone]   Fax:  [phone]  Name: [name] MRN: [id number] Date of Birth: 11-20-1955

## 2020-12-04 NOTE — Telephone Encounter (Signed)
Transition Care Management Unsuccessful Follow-up Telephone Call  Date of discharge and from where:  12/02/2020 from [facility].  Attempts:  1st Attempt  Reason for unsuccessful TCM follow-up call:  Unable to leave message

## 2020-12-05 ENCOUNTER — Other Ambulatory Visit: Payer: Self-pay | Admitting: Family Medicine

## 2020-12-06 NOTE — Progress Notes (Signed)
I, [name], LAT, ATC acting as a scribe for Theresa Leader, MD.  [name] is a 65 y.o. female who presents to [place] at [facility] today for L knee and L foot pain. Pt has a PMHx of L knee meniscus tear and DJD from 2019. Pt was last seen by Dr. Georgina Snell on 06/16/19 and was referred to orthopedic surgery for TKR/surgical consideration.  Pt suffered a stroke on 11/21/20 that resulted in L-side weakness. Pt suffered a fall last night, 3/2. Pt c/o bilat knee pain, L>R. Pt also c/o R foot pain and swelling that she injured from the falling incident. Pt locates R foot pain 3-5 toes.  She recently had a brainstem infarct and has significant gait difficulty with vertigo and ataxia.  She is scheduled to start physical therapy tomorrow.  She does not yet have a walker at home and has fallen following her hospital discharge because of her unstable gait.  Knee mechanical symptoms: yes Treatments tried: Gabapentin, creams  Dx imaging: L foot XR-  12/07/20; L knee XR - 06/16/19  Pertinent review of systems: No fevers or chills  Relevant historical information: Hypertension.  Recent stroke   Exam:  BP (!) 146/90 (BP Location: Right Arm, Patient Position: Sitting, Cuff Size: Normal)   Pulse 94   Ht 5\' 6"  (1.676 m)   Wt 204 lb (92.5 kg)   SpO2 98%   BMI 32.93 kg/m  General: Well Developed, well nourished, and in no acute distress.   MSK: Left knee moderate effusion tender palpation medial joint line normal motion.  Stable ligamentous exam.  Right knee mild effusion normal motion diffusely tender stable ligamentous exam  Foot swollen tender palpation fourth and fifth MTPs.    Lab and Radiology Results  X-ray images left knee, right knee and left foot obtained today personally and independently interpreted  Left foot: Concern for avulsion fracture at medial proximal end of the proximal phalanx fourth and fifth.  Not particularly displaced.  Left knee: Moderate DJD.   No definitive plateau fracture visible on imaging today.  Right knee: Moderate DJD.  No fractures visible.  Await formal radiology review  Procedure: Real-time Ultrasound Guided Injection of left knee superior lateral patellar space Device: Philips Affiniti 50G Images permanently stored and available for review in PACS Verbal informed consent obtained.  Discussed risks and benefits of procedure. Warned about infection bleeding damage to structures skin hypopigmentation and fat atrophy among others. Patient expresses understanding and agreement Time-out conducted.   Noted no overlying erythema, induration, or other signs of local infection.   Skin prepped in a sterile fashion.   Local anesthesia: Topical Ethyl chloride.   With sterile technique and under real time ultrasound guidance:  40 mg of Kenalog and 2 mL of Marcaine injected into knee joint. Fluid seen entering the joint capsule.   Completed without difficulty   Pain immediately resolved suggesting accurate placement of the medication.   Advised to call if fevers/chills, erythema, induration, drainage, or persistent bleeding.   Images permanently stored and available for review in the ultrasound unit.  Impression: Technically successful ultrasound guided injection.        Assessment and Plan: 65 y.o. female with left foot pain due to fractures at fourth and fifth toes.  Plan for postop shoe.  Patient is very unstable in gait now.  Is essential to use a walker while using postop shoe.  Recheck in 2 weeks.  Knee pain after fall.  No clear  fracture visible however radiology overread still pending.  Likely exacerbation of DJD.  Steroid injection today.  Recheck in 2 weeks.  Right knee pain.  Begin to be due to DJD exacerbation.  Recheck 2 weeks likely proceed with steroid injection at that time.  Unstable gait due to recent stroke.  She has had recent falls.  Patient is using a cane to ambulate which is fundamentally not safe.  I  have written a prescription for a walker.  She is waiting for a walker to be delivered to her house but has been several days now.  I do not think that this is safe or can wait.  Advised her to go to [facility] and get a walker now.  Recheck 2 weeks.   PDMP not reviewed this encounter. Orders Placed This Encounter  Procedures  . [place] LIMITED JOINT SPACE STRUCTURES LOW BILAT(NO LINKED CHARGES)    Standing Status:   Future    Number of Occurrences:   1    Standing Expiration Date:   06/09/2021    Order Specific Question:   Reason for Exam (SYMPTOM  OR DIAGNOSIS REQUIRED)    Answer:   chronic bilateral knee pain    Order Specific Question:   Preferred imaging location?    Answer:   [facility]  . DG Knee AP/LAT W/[place] Right    Standing Status:   Future    Number of Occurrences:   1    Standing Expiration Date:   12/07/2021    Order Specific Question:   Reason for Exam (SYMPTOM  OR DIAGNOSIS REQUIRED)    Answer:   eval knee pain    Order Specific Question:   Preferred imaging location?    Answer:   [facility]  . DG Knee AP/LAT W/[place] Left    Standing Status:   Future    Number of Occurrences:   1    Standing Expiration Date:   12/07/2021    Order Specific Question:   Reason for Exam (SYMPTOM  OR DIAGNOSIS REQUIRED)    Answer:   eval knee left    Order Specific Question:   Preferred imaging location?    Answer:   [facility]   Meds ordered this encounter  Medications  . AMBULATORY NON FORMULARY MEDICATION    Sig: Walker. Use as needed. Disp 1.  Unable gait R26.81    Dispense:  1 each    Refill:  0     Discussed warning signs or symptoms. Please see discharge instructions. Patient expresses understanding.   The above documentation has been reviewed and is accurate and complete [name], M.D.

## 2020-12-07 ENCOUNTER — Ambulatory Visit (INDEPENDENT_AMBULATORY_CARE_PROVIDER_SITE_OTHER): Payer: Medicare Other

## 2020-12-07 ENCOUNTER — Other Ambulatory Visit: Payer: Self-pay

## 2020-12-07 ENCOUNTER — Ambulatory Visit (INDEPENDENT_AMBULATORY_CARE_PROVIDER_SITE_OTHER): Payer: Medicare Other | Admitting: Family Medicine

## 2020-12-07 ENCOUNTER — Encounter: Payer: Self-pay | Admitting: Family Medicine

## 2020-12-07 ENCOUNTER — Ambulatory Visit: Payer: Self-pay

## 2020-12-07 VITALS — BP 140/70 | HR 77 | Ht 66.0 in | Wt 204.0 lb

## 2020-12-07 VITALS — BP 146/90 | HR 94 | Ht 66.0 in | Wt 204.0 lb

## 2020-12-07 DIAGNOSIS — M79672 Pain in left foot: Secondary | ICD-10-CM

## 2020-12-07 DIAGNOSIS — M25561 Pain in right knee: Secondary | ICD-10-CM

## 2020-12-07 DIAGNOSIS — M25572 Pain in left ankle and joints of left foot: Secondary | ICD-10-CM

## 2020-12-07 DIAGNOSIS — G8929 Other chronic pain: Secondary | ICD-10-CM

## 2020-12-07 DIAGNOSIS — M25562 Pain in left knee: Secondary | ICD-10-CM

## 2020-12-07 DIAGNOSIS — F5105 Insomnia due to other mental disorder: Secondary | ICD-10-CM

## 2020-12-07 DIAGNOSIS — W19XXXA Unspecified fall, initial encounter: Secondary | ICD-10-CM

## 2020-12-07 DIAGNOSIS — W010XXA Fall on same level from slipping, tripping and stumbling without subsequent striking against object, initial encounter: Secondary | ICD-10-CM | POA: Diagnosis not present

## 2020-12-07 DIAGNOSIS — I680 Cerebral amyloid angiopathy: Secondary | ICD-10-CM | POA: Diagnosis not present

## 2020-12-07 DIAGNOSIS — S92512A Displaced fracture of proximal phalanx of left lesser toe(s), initial encounter for closed fracture: Secondary | ICD-10-CM | POA: Diagnosis not present

## 2020-12-07 DIAGNOSIS — I10 Essential (primary) hypertension: Secondary | ICD-10-CM

## 2020-12-07 DIAGNOSIS — R0989 Other specified symptoms and signs involving the circulatory and respiratory systems: Secondary | ICD-10-CM | POA: Insufficient documentation

## 2020-12-07 DIAGNOSIS — S92912A Unspecified fracture of left toe(s), initial encounter for closed fracture: Secondary | ICD-10-CM | POA: Diagnosis not present

## 2020-12-07 DIAGNOSIS — Z8673 Personal history of transient ischemic attack (TIA), and cerebral infarction without residual deficits: Secondary | ICD-10-CM

## 2020-12-07 DIAGNOSIS — M25462 Effusion, left knee: Secondary | ICD-10-CM | POA: Diagnosis not present

## 2020-12-07 DIAGNOSIS — R2681 Unsteadiness on feet: Secondary | ICD-10-CM | POA: Diagnosis not present

## 2020-12-07 DIAGNOSIS — M19072 Primary osteoarthritis, left ankle and foot: Secondary | ICD-10-CM | POA: Diagnosis not present

## 2020-12-07 HISTORY — DX: Cerebral amyloid angiopathy: I68.0

## 2020-12-07 HISTORY — DX: Unspecified fracture of left toe(s), initial encounter for closed fracture: S92.912A

## 2020-12-07 MED ORDER — LABETALOL HCL 300 MG PO TABS: 150.0000 mg | ORAL_TABLET | Freq: Two times a day (BID) | ORAL | 0 refills | Status: DC

## 2020-12-07 MED ORDER — AMBULATORY NON FORMULARY MEDICATION: 0 refills | Status: AC

## 2020-12-07 MED ORDER — FINASTERIDE 1 MG PO TABS: 1.0000 mg | ORAL_TABLET | Freq: Every day | ORAL | 0 refills | Status: DC

## 2020-12-07 NOTE — Patient Instructions (Signed)
Thank you for coming in today.  Use the shoe.   Call or go to the ER if you develop a large red swollen joint with extreme pain or oozing puss.   Use a walker.   Recheck in 2 weeks.   Please get an Xray today before you leave

## 2020-12-07 NOTE — Progress Notes (Signed)
Established Patient Office Visit  Subjective:  Patient ID: [name], female    DOB: July 06, 1956  Age: 65 y.o. MRN: [id number]  CC:  Chief Complaint  Patient presents with  . Hospitalization Follow-up    HPI [name] presents for hospital follow-up.  She was admitted on February 15 and discharged home 5 days later on February 19.  She originally presented to the emergency department for left-sided weakness and dizziness.  The day prior she had been experiencing some intermittent chest pain and palpitations and blurry vision while at church.  She woke up the following day with left-sided weakness.  He has a prior history of right cerebral stroke in 2017.  Her blood pressure was uncontrolled when she presented to the ED.  Head CT demonstrated moderate to severe chronic white matter changes and atrophy follow-up MRI with contrast did demonstrate a tiny focus of ischemia in the left side of the medulla oblongata consistent with brainstem infarct.  She has had difficulty with gait since then she feels like she is walking around drunk all the time and actually fell last night.  Echo showed EF of 60 to 65%.  She was already on aspirin and statin daily.  Plavix was added for 75 mg daily for at least 3 months.  Referral to neurology has been placed.  I also placed a referral for 30-day cardiac monitor.  [facility]  Reports that she still awaiting supplies including a walker. She only has a cane and was walking in her kitchen last night and actually tripped and fell.  She hurt her left foot.it is tender and swollen.    Brought in log of BPs.  Range from 162/100 - 124/77.  In the hospital they attempted twice to get a renal ultrasound and unfortunately could not get a good view and so had recommended CTA for further work-up if needed.  MRI Head WO IV Contrast  Anatomical Region Laterality Modality  -- -- Magnetic Resonance    Impression Performed by  [id number] IMPRESSION:  1. Tiny focus of restricted diffusion in the left side of the medulla oblongata consistent with a tiny acute brainstem infarct.  2. Severe white matter changes consistent with chronic small vessel ischemic disease.  3. Scattered foci of susceptibility artifact consistent with amyloid angiopathy.    Past Medical History:  Diagnosis Date  . Allergy   . Anemia   . Anxiety   . Bipolar disorder ([place])   . CHF (congestive heart failure) ([place])   . Chronic diastolic heart failure (HCC)    hospitalized at [facility] regional  . Complication of anesthesia    had "breathing problems and ended up in ICU" 03/2010 report on chart  . COPD (chronic obstructive pulmonary disease) ([place])   . DDD (degenerative disc disease), lumbar   . Depression   . 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 GERD (gastroesophageal reflux disease)   . Glucose intolerance (impaired glucose tolerance)   . HLD (hyperlipidemia)    nec/nos  . HTN (hypertension)    benign essential  . MVP (mitral valve prolapse)   . Narcolepsy   . Stroke ([place])   . Suicide attempt ([place])    hx  . Syncope    admx to [facility] in 2/12: Echo with normal LVF; head CT unremarkable, ECG stable, no further w/u  . Ulcer     Past Surgical History:  Procedure Laterality Date  . AUGMENTATION MAMMAPLASTY  2003  . BLADDER SUSPENSION  2002  . DENTAL SURGERY  2016   Teeth implants x 4  . GASTRIC BYPASS  2014   at [place]    . LUMBAR LAMINECTOMY/DECOMPRESSION MICRODISCECTOMY  09/11/2011   Procedure: LUMBAR LAMINECTOMY/DECOMPRESSION MICRODISCECTOMY;  Surgeon: Tobi Bastos;  Location: [facility];  Service: Orthopedics;  Laterality: Right;  Hemi Laminectomy/Microdiscectomy L5 - S1 on the Right (X-Ray)  . PARTIAL HYSTERECTOMY  1979  . REPAIR PERONEAL TENDONS ANKLE Left 2008   after injury     Family History  Problem Relation Age of Onset  . Heart attack Father 24  . Hypertension Father    . Diabetes Mother   . Hypertension Mother   . Stroke Other        grandmother  . Colon cancer Neg Hx   . Esophageal cancer Neg Hx   . Stomach cancer Neg Hx   . Rectal cancer Neg Hx     Social History   Socioeconomic History  . Marital status: Married    Spouse name: Not on file  . Number of children: Not on file  . Years of education: Not on file  . Highest education level: Not on file  Occupational History  . Not on file  Tobacco Use  . Smoking status: Former Smoker    Quit date: 10/08/2003    Years since quitting: 17.1  . Smokeless tobacco: Never Used  Vaping Use  . Vaping Use: Never used  Substance and Sexual Activity  . Alcohol use: Yes    Comment: rare  . Drug use: No  . Sexual activity: Not on file  Other Topics Concern  . Not on file  Social History Narrative   Regularly exercises.    Social Determinants of Health   Financial Resource Strain: Not on file  Food Insecurity: Not on file  Transportation Needs: Not on file  Physical Activity: Not on file  Stress: Not on file  Social Connections: Not on file  Intimate Partner Violence: Not on file    Outpatient Medications Prior to Visit  Medication Sig Dispense Refill  . amitriptyline (ELAVIL) 10 MG tablet Take 2 tablets by mouth every evening 60 tablet 11  . aspirin 81 MG EC tablet Take 1 tablet by mouth every day 90 tablet 1  . atorvastatin (LIPITOR) 80 MG tablet Take 1 tablet (80 mg total) by mouth at bedtime. NEED OV. 30 tablet 2  . cloNIDine (CATAPRES - DOSED IN MG/24 HR) 0.1 mg/24hr patch 0.1 mg once a week.    . clopidogrel (PLAVIX) 75 MG tablet Take 75 mg by mouth daily.    . DULoxetine (CYMBALTA) 60 MG capsule Take 1 capsule by mouth every evening 30 capsule 11  . esomeprazole (NEXIUM) 40 MG capsule Take 40 mg by mouth 2 (two) times daily as needed. 1 capsule daily scheduled, will take an additional capsule if needed    . gabapentin (NEURONTIN) 800 MG tablet Take 1 tablet by mouth 3 times a day 90  tablet 6  . hydrALAZINE (APRESOLINE) 100 MG tablet Take 1 tablet by mouth 3 times a day 90 tablet 5  . lidocaine (XYLOCAINE) 5 % ointment Apply 1 application topically as needed.    [name] losartan (COZAAR) 100 MG tablet Take 100 mg by mouth daily.    [name] MINOXIDIL, TOPICAL, 5 % SOLN Apply 1 application topically daily. 120 mL PRN  . Multiple Vitamins-Minerals (MULTIVITAMINS THER. W/MINERALS) TABS Take 1 tablet by  mouth daily.    [name] spironolactone (ALDACTONE) 50 MG tablet Take 50 mg by mouth daily.    . traZODone (DESYREL) 50 MG tablet Take 50 mg by mouth at bedtime.    . VENTOLIN HFA 108 (90 BASE) MCG/ACT inhaler prn    . amLODipine-olmesartan (AZOR) 10-40 MG tablet Take 1 tablet by mouth every day. NEED APPT/REFILL 30 tablet 11  . amLODipine-olmesartan (AZOR) 10-40 MG tablet Take 1 tablet by mouth daily.    . busPIRone (BUSPAR) 10 MG tablet Take 1 tablet by mouth 3 times a day 90 tablet 2  . cloNIDine (CATAPRES) 0.2 MG tablet TAKE TWO TABLETS BY MOUTH 3 TIMES A DAY 180 tablet 11  . diclofenac Sodium (VOLTAREN) 1 % GEL Apply 4 grams to the skin four times a day 500 g 11  . finasteride (PROPECIA) 1 MG tablet Take 1 tablet by mouth once daily 30 tablet 0  . furosemide (LASIX) 40 MG tablet Take 1 tablet by mouth twice daily 60 tablet 11  . labetalol (NORMODYNE) 300 MG tablet Take 2 tablets by mouth twice daily 478 tablet 11  . folic acid (FOLVITE) 1 MG tablet Take 1 mg by mouth daily.     [name] spironolactone (ALDACTONE) 50 MG tablet Take 1 tablet by mouth every day 90 tablet 1   No facility-administered medications prior to visit.    Allergies  Allergen Reactions  . Codeine Nausea And Vomiting    ROS Review of Systems    Objective:    Physical Exam Constitutional:      Appearance: She is well-developed and well-nourished.  HENT:     Head: Normocephalic and atraumatic.  Cardiovascular:     Rate and Rhythm: Normal rate and regular rhythm.     Heart sounds: Normal heart sounds.  Pulmonary:      Effort: Pulmonary effort is normal.     Breath sounds: Normal breath sounds.  Musculoskeletal:     Comments: Pain and tenderness at the fourth and fifth distal metatarsal heads.  Pain with flexion of the fourth and fifth toes as well.  She has some bruising at the base of the fifth toe.  Skin:    General: Skin is warm and dry.  Neurological:     Mental Status: She is alert and oriented to person, place, and time.  Psychiatric:        Mood and Affect: Mood and affect normal.        Behavior: Behavior normal.     BP 140/70   Pulse 77   Ht 5\' 6"  (1.676 m)   Wt 204 lb (92.5 kg)   SpO2 100%   BMI 32.93 kg/m  Wt Readings from Last 3 Encounters:  12/07/20 204 lb (92.5 kg)  12/07/20 204 lb (92.5 kg)  05/23/20 203 lb (92.1 kg)     Health Maintenance Due  Topic Date Due  . COVID-19 Vaccine (2 - Booster for Janssen series) 03/31/2020    There are no preventive care reminders to display for this patient.  Lab Results  Component Value Date   TSH 0.75 04/14/2017   Lab Results  Component Value Date   WBC 8.9 02/11/2018   HGB 14.0 02/11/2018   HCT 41.5 02/11/2018   MCV 82.2 02/11/2018   PLT 254 02/11/2018   Lab Results  Component Value Date   NA 142 12/03/2019   K 3.9 12/03/2019   CO2 26 12/03/2019   GLUCOSE 113 (H) 12/03/2019   BUN 11 12/03/2019  CREATININE 0.93 12/03/2019   BILITOT 0.7 12/03/2019   ALKPHOS 99 04/14/2017   AST 14 12/03/2019   ALT 9 12/03/2019   PROT 6.0 (L) 12/03/2019   ALBUMIN 4.1 04/14/2017   CALCIUM 9.4 12/03/2019   GFR 103.47 08/08/2011   Lab Results  Component Value Date   CHOL 180 12/03/2019   Lab Results  Component Value Date   HDL 49 (L) 12/03/2019   Lab Results  Component Value Date   LDLCALC 110 (H) 12/03/2019   Lab Results  Component Value Date   TRIG 103 12/03/2019   Lab Results  Component Value Date   CHOLHDL 3.7 12/03/2019   Lab Results  Component Value Date   HGBA1C 5.6 04/14/2017      Assessment & Plan:    Problem List Items Addressed This Visit      Cardiovascular and Mediastinum   Labile hypertension   Relevant Medications   labetalol (NORMODYNE) 300 MG tablet   cloNIDine (CATAPRES - DOSED IN MG/24 HR) 0.1 mg/24hr patch   spironolactone (ALDACTONE) 50 MG tablet   losartan (COZAAR) 100 MG tablet   Essential hypertension, malignant    She had extremely labile pressures while in the hospital and in the rehab center they adjusted several of her medications. She didn't bring her meds with her so we are not 100% clear on exactly what she is taking some going off of her discharge list and partially based on what she is telling me.  Maybe next time she can actually bring in her medications with her so that we can better reconcile those..        Relevant Medications   labetalol (NORMODYNE) 300 MG tablet   cloNIDine (CATAPRES - DOSED IN MG/24 HR) 0.1 mg/24hr patch   spironolactone (ALDACTONE) 50 MG tablet   losartan (COZAAR) 100 MG tablet   Cerebral amyloid angiopathy (CODE)   Relevant Medications   labetalol (NORMODYNE) 300 MG tablet   cloNIDine (CATAPRES - DOSED IN MG/24 HR) 0.1 mg/24hr patch   spironolactone (ALDACTONE) 50 MG tablet   losartan (COZAAR) 100 MG tablet     Other   Pain in limb   Relevant Orders   DG Foot Complete Left   DG Toe 5th Left   Insomnia due to mental disorder    Started her on trazodone in the hospital.      History of stroke - Primary    Unfortunately still having significant balance issues.  Hopefully this will continue to improve over the coming months.  She does have her follow-up with neurology scheduled in a couple of weeks.  Continue aspirin and Plavix for now.       Other Visit Diagnoses    Fall from slip, trip, or stumble, initial encounter         Left foot pain, s/p fall-we will get x-rays.  Concern for possible fracture she is actually following up with her sports medicine doctor this afternoon for knee pain.    Meds ordered this  encounter  Medications  . finasteride (PROPECIA) 1 MG tablet    Sig: Take 1 tablet (1 mg total) by mouth daily.    Dispense:  1 tablet    Refill:  0  . labetalol (NORMODYNE) 300 MG tablet    Sig: Take 0.5 tablets (150 mg total) by mouth 2 (two) times daily.    Dispense:  1 tablet    Refill:  0    Follow-up: Return in about 6 weeks (around 01/18/2021) for bp.  I spent 45 minutes on the day of the encounter to include pre-visit record review, face-to-face time with the patient and post visit ordering of test.   Beatrice Lecher, MD

## 2020-12-07 NOTE — Assessment & Plan Note (Addendum)
She had extremely labile pressures while in the hospital and in the rehab center they adjusted several of her medications. She didn't bring her meds with her so we are not 100% clear on exactly what she is taking some going off of her discharge list and partially based on what she is telling me.  Maybe next time she can actually bring in her medications with her so that we can better reconcile those.[name]

## 2020-12-07 NOTE — Assessment & Plan Note (Signed)
Unfortunately still having significant balance issues.  Hopefully this will continue to improve over the coming months.  She does have her follow-up with neurology scheduled in a couple of weeks.  Continue aspirin and Plavix for now.

## 2020-12-07 NOTE — Assessment & Plan Note (Signed)
Started her on trazodone in the hospital.

## 2020-12-08 ENCOUNTER — Ambulatory Visit: Payer: Medicare Other | Admitting: Rehabilitative and Restorative Service Providers"

## 2020-12-12 ENCOUNTER — Other Ambulatory Visit: Payer: Self-pay | Admitting: Cardiology

## 2020-12-14 DIAGNOSIS — I509 Heart failure, unspecified: Secondary | ICD-10-CM | POA: Diagnosis not present

## 2020-12-14 DIAGNOSIS — F319 Bipolar disorder, unspecified: Secondary | ICD-10-CM | POA: Diagnosis not present

## 2020-12-14 DIAGNOSIS — Z6835 Body mass index (BMI) 35.0-35.9, adult: Secondary | ICD-10-CM | POA: Diagnosis not present

## 2020-12-14 DIAGNOSIS — E1169 Type 2 diabetes mellitus with other specified complication: Secondary | ICD-10-CM | POA: Diagnosis not present

## 2020-12-14 DIAGNOSIS — R030 Elevated blood-pressure reading, without diagnosis of hypertension: Secondary | ICD-10-CM | POA: Diagnosis not present

## 2020-12-14 DIAGNOSIS — Z8673 Personal history of transient ischemic attack (TIA), and cerebral infarction without residual deficits: Secondary | ICD-10-CM | POA: Diagnosis not present

## 2020-12-14 NOTE — Progress Notes (Signed)
Left knee x-ray shows medium arthritis.

## 2020-12-14 NOTE — Progress Notes (Signed)
Right knee x-ray shows medium arthritis

## 2020-12-18 ENCOUNTER — Ambulatory Visit (INDEPENDENT_AMBULATORY_CARE_PROVIDER_SITE_OTHER): Payer: Medicare Other | Admitting: Rehabilitative and Restorative Service Providers"

## 2020-12-18 ENCOUNTER — Other Ambulatory Visit: Payer: Self-pay

## 2020-12-18 DIAGNOSIS — M6281 Muscle weakness (generalized): Secondary | ICD-10-CM

## 2020-12-18 DIAGNOSIS — R2689 Other abnormalities of gait and mobility: Secondary | ICD-10-CM

## 2020-12-18 DIAGNOSIS — R2681 Unsteadiness on feet: Secondary | ICD-10-CM | POA: Diagnosis not present

## 2020-12-18 DIAGNOSIS — R29818 Other symptoms and signs involving the nervous system: Secondary | ICD-10-CM | POA: Diagnosis not present

## 2020-12-18 NOTE — Therapy (Signed)
[place], [place], [address] Phone: [phone]   Fax:  [phone]  Physical Therapy Evaluation  Patient Details  Name: [name] MRN: [id number] Date of Birth: 03-23-1956 No data recorded  Encounter Date: 12/18/2020   PT End of Session - 12/18/20 1642    Visit Number 1    Number of Visits 16    Date for PT Re-Evaluation 02/16/21    Authorization Type medicare, BCBS federal secondary    Progress Note Due on Visit 10    PT Start Time 1430    PT Stop Time 1520    PT Time Calculation (min) 50 min           Past Medical History:  Diagnosis Date  . Allergy   . Anemia   . Anxiety   . Bipolar disorder ([place])   . CHF (congestive heart failure) ([place])   . Chronic diastolic heart failure (HCC)    hospitalized at [facility]  . Complication of anesthesia    had "breathing problems and ended up in ICU" 03/2010 report on chart  . COPD (chronic obstructive pulmonary disease) ([place])   . DDD (degenerative disc disease), lumbar   . Depression   . 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 GERD (gastroesophageal reflux disease)   . Glucose intolerance (impaired glucose tolerance)   . HLD (hyperlipidemia)    nec/nos  . HTN (hypertension)    benign essential  . MVP (mitral valve prolapse)   . Narcolepsy   . Stroke ([place])   . Suicide attempt ([place])    hx  . Syncope    admx to [facility] in 2/12: Echo with normal LVF; head CT unremarkable, ECG stable, no further w/u  . Ulcer     Past Surgical History:  Procedure Laterality Date  . AUGMENTATION MAMMAPLASTY  2003  . BLADDER SUSPENSION  2002  . DENTAL SURGERY  2016   Teeth implants x 4  . GASTRIC BYPASS  2014   at [place]    . LUMBAR LAMINECTOMY/DECOMPRESSION MICRODISCECTOMY  09/11/2011   Procedure: LUMBAR LAMINECTOMY/DECOMPRESSION MICRODISCECTOMY;  Surgeon: Tobi Bastos;  Location: [facility];   Service: Orthopedics;  Laterality: Right;  Hemi Laminectomy/Microdiscectomy L5 - S1 on the Right (X-Ray)  . PARTIAL HYSTERECTOMY  1979  . REPAIR PERONEAL TENDONS ANKLE Left 2008   after injury     There were no vitals filed for this visit.    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  The patient arrived for PT evaluation, but had not yet received her rollator RW and did not think she could tolerate the evaluation.  She required min A to walk to her car that day.  She called last week to cancel her eval b/c she still did not have the rollator RW and PT contacted Education officer, museum.  She arrives today with drive rollator RW at a short height.  Her cc: unsteadiness, L sided weakness, R sided numbness/ diminished temperature discrimination.  She also notes some blurry vision, sensation of "feeling drunk".  The patient had a fall last week and fractured toes on the L foot.    Pertinent History bipolar disorder, HTN, CHF, prolapsed mitral valve, insomnia, neuropathy    Patient Stated Goals "If I can link my brain and body to walk straight, not feeling drunk."  She likes to care for her dogs and pets, was independent with driving, shopping, tasks.    Currently in Pain? Yes    Pain Score 5    8/10 in standing   Pain Location Foot    Pain Orientation Left    Pain Type Acute pain    Pain Onset In the past 7 days    Pain Frequency Intermittent    Aggravating Factors  putting weight on L side    Pain Relieving Factors resting    Multiple Pain Sites Yes    Pain Score --   significant pain   Pain Location Leg    Pain Orientation Right    Pain Descriptors / Indicators Aching;Sore    Pain Type Acute pain    Pain Onset In the past 7 days    Pain Frequency Intermittent    Aggravating Factors  after a fall last week    Pain Relieving Factors unsure-- nothing is working              [facility] PT Assessment - 12/18/20 1446       Assessment   Medical Diagnosis brainstem stroke    Onset Date/Surgical Date 11/20/20    Hand Dominance Left    Prior Therapy inpatient rehab prior to d/c home      Precautions   Precautions Fall      Restrictions   Weight Bearing Restrictions No      Balance Screen   Has the patient fallen in the past 6 months Yes    How many times? 1- since d/c home from IP rehab    Has the patient had a decrease in activity level because of a fear of falling?  Yes   due to recent stroke   Is the patient reluctant to leave their home because of a fear of falling?  Yes   due to recent stroke     [place] residence    Living Arrangements Spouse/significant other   caregiver for her husband, son/daughter help and brother helps   Type of [place] Access Level entry    Home Layout One level    [place] - 2 wheels   rollator with seat (2 swivel wheels)     Prior Function   Level of [place] Retired    Leisure cares for husband and for her pets      Cognition   Overall Cognitive Status Impaired/Different from baseline   doesn't feel quite as sharp; pronunciation of words can be challenging at times; she can remember med schedule     Sensation   Light Touch Impaired Detail    Light Touch Impaired Details Impaired RUE    Hot/Cold Impaired Detail   on right side     Coordination   Gross Motor Movements are Fluid and Coordinated Yes   rapid alternating movements and finger to nose dysmetria and slowed pace   Finger Nose Finger Test past pointing on the left side, slow on R      ROM / Strength   AROM / PROM / Strength AROM;Strength      AROM   Overall AROM  Within functional limits for tasks performed      Strength   Overall Strength Deficits    Overall Strength Comments shoulder flexion and abduction is 4+/5, elbow flexion 5/5 bilaterally    Strength Assessment Site Shoulder;Hip;Knee;Ankle    Right/Left  Hip Right;Left    Right Hip Flexion 3/5   with pain   Left Hip Flexion 3/5    Right/Left Knee Right;Left    Right Knee Flexion 3/5   with pain in R lateral thigh   Right Knee Extension 3/5   with pain in R latral thigh   Left Knee Flexion 3/5    Left Knee Extension 3/5    Right/Left Ankle Right;Left    Right Ankle Dorsiflexion 4+/5    Left Ankle Dorsiflexion --   deferred MMT due to recent foot fx     Bed Mobility   Bed Mobility Supine to Sit;Sit to Supine    Supine to Sit Independent    Sit to Supine Independent      Transfers   Transfers Sit to Stand;Stand to Sit    Sit to Stand 5: Supervision    Stand to Sit 5: Supervision      Ambulation/Gait   Ambulation/Gait Yes    Ambulation/Gait Assistance 5: Supervision    Ambulation/Gait Assistance Details *walker too low--adjusted during session    Ambulation Distance (Feet) 100 Feet    Assistive device Rollator    Gait Pattern Decreased stride length;Step-through pattern;Decreased dorsiflexion - right;Decreased dorsiflexion - left;Shuffle;Ataxic    Gait velocity 0.92 ft/sec      Standardized Balance Assessment   Standardized Balance Assessment Berg Balance Test      Berg Balance Test   Sit to Stand Able to stand using hands after several tries    Standing Unsupported Able to stand 30 seconds unsupported    Sitting with Back Unsupported but Feet Supported on Floor or Stool Able to sit safely and securely 2 minutes    Stand to Sit Uses backs of legs against chair to control descent    Transfers Able to transfer with verbal cueing and /or supervision    Standing Unsupported with Eyes Closed Unable to keep eyes closed 3 seconds but stays steady    Standing Unsupported with Feet Together Needs help to attain position and unable to hold for 15 seconds    From Standing, Reach Forward with Outstretched Arm Reaches forward but needs supervision    From Standing Position, Pick up Object from Floor Unable to try/needs assist to keep  balance    From Standing Position, Turn to Look Behind Over each Shoulder Needs assist to keep from losing balance and falling    Turn 360 Degrees Needs assistance while turning    Standing Unsupported, Alternately Place Feet on Step/Stool Needs assistance to keep from falling or unable to try    Standing Unsupported, One Foot in [employer] balance while stepping or standing    Standing on One Leg Unable to try or needs assist to prevent fall    Total Score 14    Berg comment: 14/56 indicating high fall risk                      Objective measurements completed on examination: See above findings.       [facility] Adult PT Treatment/Exercise - 12/18/20 1446      Neuro Re-ed    Neuro Re-ed Details  sit<>stand with R thigh pain;      Exercises   Exercises Lumbar      Lumbar Exercises: Stretches   Lower Trunk Rotation 5 reps      Lumbar Exercises: Supine   Bent Knee Raise Limitations supine marching  PT Education - 12/18/20 1641    Education Details PT and patient began establishing HEP, however she c/o continued pain in R lateral thigh since a fall last week-- recommended she see MD before initiating HEP.    Person(s) Educated Patient    Methods Explanation;Demonstration;Handout    Comprehension Verbalized understanding;Returned demonstration            PT Short Term Goals - 12/18/20 1642      PT SHORT TERM GOAL #1   Title The patient will be indep with HEP for LE strengthening, coordination, balance, and general mobility.    Time 4    Period Weeks    Target Date 01/17/21      PT SHORT TERM GOAL #2   Title The patient will improve Berg balance score from 14/56 to > or equal to 26/56 indicating reduced risk for falls.    Time 4    Period Weeks    Target Date 01/17/21      PT SHORT TERM GOAL #3   Title The patient will improve gait speed from 0.92 ft/sec to > or equal to 1.4 ft/sec demonstrating improved functional mobility.    Time  4    Period Weeks    Target Date 01/17/21      PT SHORT TERM GOAL #4   Title The patient will ambulate household surfaces with SPC mod indep x 300 ft.    Time 4    Period Weeks    Target Date 01/17/21             PT Long Term Goals - 12/18/20 1647      PT LONG TERM GOAL #1   Title The patient will be indep with progression of HEP.    Time 8    Period Weeks    Target Date 02/16/21      PT LONG TERM GOAL #2   Title The patient will improve Berg balance score to > or equal to 36/56 to demo dec'd risk for falls.    Time 8    Period Weeks    Target Date 02/16/21      PT LONG TERM GOAL #3   Title The patient will improve gait speed to > or equal to 1.8 ft/sec to demo dec'ing risk for falls.    Time 8    Period Weeks    Target Date 02/16/21      PT LONG TERM GOAL #4   Title The patient will ambulate with least restrictive assistive device x 600 ft demonstrating return to community surface ambulation mod indep.    Time 8    Period Weeks    Target Date 02/16/21      PT LONG TERM GOAL #5   Title The patient will demonstrate improved LE strength moving sit<>stand without UE support.    Time 8    Period Weeks    Target Date 02/16/21                  Plan - 12/18/20 1649    Clinical Impression Statement The patient is a 65 yo female presenting to OP physical therapy s/p CVA on 11/20/20.  She d/c home from the hospital on 12/02/20 and has had one fall since d/c home.  She presents to day with impairments in strength, sensation, coordination, oculomotor control, balance, and gait limiting participation in household ADLS and IADLs.  PT to address deficits to optimize functional status and reduce risk for falls.  Personal Factors and Comorbidities Comorbidity 3+;Comorbidity 1;Comorbidity 2    Comorbidities bipolar, CHF, HTN    Examination-Activity Limitations Locomotion Level;Squat;Stairs;Stand;Lift    Examination-Participation Restrictions Cleaning;Meal Prep;Community  Activity;Driving;Laundry;Medication Management    Stability/Clinical Decision Making Evolving/Moderate complexity    Clinical Decision Making Moderate    Rehab Potential Good    PT Frequency 2x / week    PT Duration 8 weeks    PT Treatment/Interventions ADLs/Self Care Home Management;Gait training;Stair training;Functional mobility training;Therapeutic activities;Therapeutic exercise;Balance training;Neuromuscular re-education;DME Instruction;Patient/family education;Orthotic Fit/Training;Taping;Manual techniques    PT Next Visit Plan Establish HEP (supine marching, gentle lumbar rocking, sit<>stand as able), standing balance, activity tolerance, cues for visual spotting during gait to reduce dizziness    Recommended Other Services *patient has order for OT as well.  PT to establish care and work on fall prevention.  Will discuss transfer to multi-D clinic as she gets safe (not currently interested in increasing distance to drive to another clinic)    Consulted and Agree with Plan of Care Patient           Patient will benefit from skilled therapeutic intervention in order to improve the following deficits and impairments:  Pain,Impaired flexibility,Abnormal gait,Decreased coordination,Difficulty walking,Dizziness,Decreased activity tolerance,Decreased balance,Decreased mobility,Decreased strength,Postural dysfunction,Impaired sensation  Visit Diagnosis: Other abnormalities of gait and mobility  Muscle weakness (generalized)  Unsteadiness on feet  Other symptoms and signs involving the nervous system     Problem List Patient Active Problem List   Diagnosis Date Noted  . Labile hypertension 12/07/2020  . Unstable gait 12/07/2020  . Closed nondisplaced fracture of phalanx of toe of left foot 12/07/2020  . Bilateral chronic knee pain 12/07/2020  . Cerebral amyloid angiopathy (CODE) 12/07/2020  . Iron deficiency 05/23/2020  . Post herpetic neuralgia 02/09/2019  . 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 Caregiver burden 02/10/2018  . Mitral valve prolapse syndrome 08/15/2016  . Congestive heart failure ([place]) 08/15/2016  . Right cervical radiculopathy 08/09/2016  . Essential hypertension, malignant 06/12/2016  . History of stroke 03/13/2016  . 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 Narcolepsy 03/13/2013  . Coronary atherosclerosis 03/13/2013  . DDD (degenerative disc disease), lumbar   . HIRSUTISM 06/02/2009  . BIPOLAR DISORDER UNSPECIFIED 05/17/2009  . Pain in limb 08/03/2008  . Hyperlipidemia 07/15/2007  . FATIGUE 07/15/2007    Kiln, PT 12/18/2020, 4:56 PM  [facility] [place], [place], [address] Phone: [phone]   Fax:  [phone]  Name: [name] MRN: [id number] Date of Birth: 07-09-1956

## 2020-12-20 NOTE — Progress Notes (Signed)
I, Wendy Poet, LAT, ATC, am serving as scribe for Dr. [name].  [name] is a 65 y.o. female who presents to [place] at [facility] today for chronic bilat knee pain and fx to the L 4th and 5th toes. Pt suffered a fall on 12/06/20 and a stroke on 11/21/20 that resulted in L-sided weakness. Pt also recently had a brainstem infarct and has significant gait difficulty with vertigo and ataxia. Pt was last seen by Dr. Georgina Snell on 12/07/20 and was advised to go to [facility] to get a walker and begin using it now, as well as a post-op shoe. Pt had already been referred to PT and has completed 2 visits. Today, pt reports that her L foot con't to bother her and notes some more newly noticed L ankle pain and R thigh pain.  She is wearing a post-op shoe and is using a rolling walker.  Dx imaging: 12/07/20 L foot XR, L & R knee XR  06/16/19 L knee XR  Pertinent review of systems: No fevers or chills  Relevant historical information: History struck with vertigo   Exam:  BP 120/80 (BP Location: Left Arm, Patient Position: Sitting, Cuff Size: Normal)   Pulse 86   Ht 5\' 6"  (1.676 m)   Wt 204 lb (92.5 kg)   SpO2 96%   BMI 32.93 kg/m  General: Well Developed, well nourished, and in no acute distress.   MSK:  Right hip normal-appearing tender palpation greater trochanter.  Normal hip motion.  Hip abduction strength slightly diminished.  Left ankle slightly swollen laterally.  Tender palpation lateral malleolus. Stable ligamentous exam.  Slight reduced strength to ankle eversion.  Left foot tender palpation fourth and fifth toes.    Lab and Radiology Results  X-ray images right hip, left ankle and foot taken today personally and independently interpreted  Right hip: Degenerative changes right hip.  No acute fractures.  Left ankle: Mild degenerative changes ankle.  No acute fractures.  Evidence ORIF prior left navicular.  Left foot: Unchanged appearance of fracture  fourth and fifth proximal phalanx  Await formal radiology    Assessment and Plan: 65 y.o. female with  Sequelae of fall occurring a few weeks ago.  Toe fractures: Appearance continue postop shoe.  Left ankle pain: Sprain.  Doubtful for fracture await radiology overread.  Plan for ASO ankle brace with postop shoe.  States usually does like this may consider CAM Walker boot but I would like to avoid that given patient's unstable gait with her vertigo.  Add on physical therapy  Right hip pain: Contusion/hip abductor tendinopathy.  Add on physical therapy  Recheck back in about 2 weeks.   PDMP not reviewed this encounter. Orders Placed This Encounter  Procedures  . DG Ankle Complete Left    Standing Status:   Future    Number of Occurrences:   1    Standing Expiration Date:   12/21/2021    Order Specific Question:   Reason for Exam (SYMPTOM  OR DIAGNOSIS REQUIRED)    Answer:   eval left anlke and foot pain    Order Specific Question:   Preferred imaging location?    Answer:   [facility]  . DG Foot Complete Left    Standing Status:   Future    Number of Occurrences:   1    Standing Expiration Date:   12/21/2021    Order Specific Question:   Reason for Exam (SYMPTOM  OR DIAGNOSIS REQUIRED)    Answer:   left ankle and foot pain    Order Specific Question:   Preferred imaging location?    Answer:   [facility]  . DG HIP UNILAT WITH PELVIS 2-3 VIEWS RIGHT    Standing Status:   Future    Number of Occurrences:   1    Standing Expiration Date:   12/21/2021    Order Specific Question:   Reason for Exam (SYMPTOM  OR DIAGNOSIS REQUIRED)    Answer:   eval rt hip pain    Order Specific Question:   Preferred imaging location?    Answer:   [facility]  . Ambulatory referral to Physical Therapy    Referral Priority:   Routine    Referral Type:   Physical Medicine    Referral Reason:   Specialty Services Required    Requested Specialty:   Physical Therapy   No  orders of the defined types were placed in this encounter.    Discussed warning signs or symptoms. Please see discharge instructions. Patient expresses understanding.   The above documentation has been reviewed and is accurate and complete [name], M.D.

## 2020-12-21 ENCOUNTER — Other Ambulatory Visit: Payer: Self-pay

## 2020-12-21 ENCOUNTER — Ambulatory Visit (INDEPENDENT_AMBULATORY_CARE_PROVIDER_SITE_OTHER): Payer: Medicare Other

## 2020-12-21 ENCOUNTER — Ambulatory Visit (INDEPENDENT_AMBULATORY_CARE_PROVIDER_SITE_OTHER): Payer: Medicare Other | Admitting: Family Medicine

## 2020-12-21 ENCOUNTER — Encounter: Payer: Self-pay | Admitting: Family Medicine

## 2020-12-21 VITALS — BP 120/80 | HR 86 | Ht 66.0 in | Wt 204.0 lb

## 2020-12-21 DIAGNOSIS — M25572 Pain in left ankle and joints of left foot: Secondary | ICD-10-CM | POA: Diagnosis not present

## 2020-12-21 DIAGNOSIS — S92912A Unspecified fracture of left toe(s), initial encounter for closed fracture: Secondary | ICD-10-CM | POA: Diagnosis not present

## 2020-12-21 DIAGNOSIS — M25551 Pain in right hip: Secondary | ICD-10-CM

## 2020-12-21 DIAGNOSIS — M7989 Other specified soft tissue disorders: Secondary | ICD-10-CM | POA: Diagnosis not present

## 2020-12-21 DIAGNOSIS — S62617A Displaced fracture of proximal phalanx of left little finger, initial encounter for closed fracture: Secondary | ICD-10-CM | POA: Diagnosis not present

## 2020-12-21 DIAGNOSIS — M1611 Unilateral primary osteoarthritis, right hip: Secondary | ICD-10-CM | POA: Diagnosis not present

## 2020-12-21 NOTE — Patient Instructions (Signed)
Thank you for coming in today.  Please get an Xray today before you leave  Use the ankle brace.   Continue PT.   Recheck in 2-3 weeks.

## 2020-12-22 ENCOUNTER — Ambulatory Visit (INDEPENDENT_AMBULATORY_CARE_PROVIDER_SITE_OTHER): Payer: Medicare Other | Admitting: Rehabilitative and Restorative Service Providers"

## 2020-12-22 DIAGNOSIS — R2689 Other abnormalities of gait and mobility: Secondary | ICD-10-CM

## 2020-12-22 DIAGNOSIS — R29818 Other symptoms and signs involving the nervous system: Secondary | ICD-10-CM

## 2020-12-22 DIAGNOSIS — M6281 Muscle weakness (generalized): Secondary | ICD-10-CM

## 2020-12-22 DIAGNOSIS — R2681 Unsteadiness on feet: Secondary | ICD-10-CM

## 2020-12-22 NOTE — Patient Instructions (Signed)
Access Code: [email or web address] URL: [email or web address] Date: 12/22/2020 Prepared by: Rudell Cobb  Exercises Supine Lower Trunk Rotation - 2 x daily - 7 x weekly - 1 sets - 10 reps Supine March - 2 x daily - 7 x weekly - 1 sets - 10 reps Seated Gaze Stabilization with Head Rotation - 2 x daily - 7 x weekly - 1 sets - 10 reps

## 2020-12-22 NOTE — Therapy (Signed)
[name], [address] Phone: [phone]   Fax:  [phone]  Physical Therapy Treatment  Patient Details  Name: Theresa [name] MRN: [id number] Date of Birth: October 31, 1955 Referring Provider (PT): Gar Ponto, DO   Encounter Date: 12/22/2020   PT End of Session - 12/22/20 1202    Visit Number 2    Number of Visits 16    Date for PT Re-Evaluation 02/16/21    Authorization Type medicare, BCBS federal secondary    Progress Note Due on Visit 10    PT Start Time 1158    PT Stop Time 1236    PT Time Calculation (min) 38 min           Past Medical History:  Diagnosis Date  . Allergy   . Anemia   . Anxiety   . Bipolar disorder (Theresa [name])   . CHF (congestive heart failure) (Theresa [name])   . Chronic diastolic heart failure (HCC)    hospitalized at [facility] regional  . Complication of anesthesia    had "breathing problems and ended up in ICU" 03/2010 report on chart  . COPD (chronic obstructive pulmonary disease) ([name])   . DDD (degenerative disc disease), lumbar   . Depression   . 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 GERD (gastroesophageal reflux disease)   . Glucose intolerance (impaired glucose tolerance)   . HLD (hyperlipidemia)    nec/nos  . HTN (hypertension)    benign essential  . MVP (mitral valve prolapse)   . Narcolepsy   . Stroke (Theresa [name])   . Suicide attempt ([name])    hx  . Syncope    admx to [facility] in 2/12: Echo with normal LVF; head CT unremarkable, ECG stable, no further w/u  . Ulcer     Past Surgical History:  Procedure Laterality Date  . AUGMENTATION MAMMAPLASTY  2003  . BLADDER SUSPENSION  2002  . DENTAL SURGERY  2016   Teeth implants x 4  . GASTRIC BYPASS  2014   at [name]    . LUMBAR LAMINECTOMY/DECOMPRESSION MICRODISCECTOMY  09/11/2011   Procedure: LUMBAR LAMINECTOMY/DECOMPRESSION MICRODISCECTOMY;  Surgeon: Tobi Bastos;  Location: [facility];  Service: Orthopedics;  Laterality: Right;  Hemi Laminectomy/Microdiscectomy L5 - S1 on the Right (X-Ray)  . PARTIAL HYSTERECTOMY  1979  . REPAIR PERONEAL TENDONS ANKLE Left 2008   after injury     There were no vitals filed for this visit.   Subjective Assessment - 12/22/20 1201    Subjective The patient reports she saw Dr. Georgina Snell yesterday and he did x-rays on her hip and ankle.  Dizziness is slowly improving.    Pertinent History bipolar disorder, HTN, CHF, prolapsed mitral valve, insomnia, neuropathy    Patient Stated Goals "If I can link my brain and body to walk straight, not feeling drunk."  She likes to care for her dogs and pets, was independent with driving, shopping, tasks.    Currently in Pain? Yes    Pain Score 6     Pain Orientation Left    Pain Descriptors / Indicators Tightness;Sore;Aching;Discomfort    Pain Onset 1 to 4 weeks ago    Pain Frequency Intermittent    Aggravating Factors  weight bearing    Pain Relieving Factors rest              [facility] PT Assessment - 12/22/20 1203  Assessment   Medical Diagnosis brainstem stroke    Referring Provider (PT) Gar Ponto, DO    Onset Date/Surgical Date 11/20/20      AROM   Overall AROM  Deficits    Overall AROM Comments has limited L ankle ROM from 10-28 degrees; R hip has ability to lift against gravity into abduction    AROM Assessment Site Ankle    Right/Left Ankle Left    Left Ankle Dorsiflexion -10   from neutral   Left Ankle Plantar Flexion 28    Left Ankle Inversion 0   pain with inversion     Strength   Overall Strength Comments Not able to MMT due to recent injury                         [facility] Adult PT Treatment/Exercise - 12/22/20 1203      Ambulation/Gait   Ambulation/Gait Yes    Ambulation/Gait Assistance 6: Modified independent (Device/Increase time)    Ambulation Distance (Feet) 100 Feet    Assistive device Rollator      Neuro Re-ed     Neuro Re-ed Details  sidestepping along countertop x 10 feet x 3 reps R and L sides; standing habituation performing horizontal and vertical head turns      Exercises   Exercises Knee/Hip      Lumbar Exercises: Stretches   Lower Trunk Rotation 5 reps      Lumbar Exercises: Supine   Bent Knee Raise 10 reps      Knee/Hip Exercises: Aerobic   Nustep level 3 x 4 minutes LEs      Knee/Hip Exercises: Seated   Sit to Sand 5 reps   to rolling walker     Knee/Hip Exercises: Supine   Bridges 5 reps    Bridges Limitations painful in the L foot, therefore modified to do supine marches           Vestibular Treatment/Exercise - 12/22/20 1204      Vestibular Treatment/Exercise   Vestibular Treatment Provided Habituation;Gaze    Habituation Exercises Seated Horizontal Head Turns    Gaze Exercises X1 Viewing Horizontal      X1 Viewing Horizontal   Foot Position seated with cues on technique                 PT Education - 12/22/20 1233    Education Details HEP -- limited due to pain in R thigh and L ankle    Person(s) Educated Patient    Methods Explanation;Handout;Demonstration    Comprehension Verbalized understanding;Returned demonstration            PT Short Term Goals - 12/18/20 1642      PT SHORT TERM GOAL #1   Title The patient will be indep with HEP for LE strengthening, coordination, balance, and general mobility.    Time 4    Period Weeks    Target Date 01/17/21      PT SHORT TERM GOAL #2   Title The patient will improve Berg balance score from 14/56 to > or equal to 26/56 indicating reduced risk for falls.    Time 4    Period Weeks    Target Date 01/17/21      PT SHORT TERM GOAL #3   Title The patient will improve gait speed from 0.92 ft/sec to > or equal to 1.4 ft/sec demonstrating improved functional mobility.    Time 4    Period Weeks  Target Date 01/17/21      PT SHORT TERM GOAL #4   Title The patient will ambulate household surfaces with SPC  mod indep x 300 ft.    Time 4    Period Weeks    Target Date 01/17/21             PT Long Term Goals - 12/18/20 1647      PT LONG TERM GOAL #1   Title The patient will be indep with progression of HEP.    Time 8    Period Weeks    Target Date 02/16/21      PT LONG TERM GOAL #2   Title The patient will improve Berg balance score to > or equal to 36/56 to demo dec'd risk for falls.    Time 8    Period Weeks    Target Date 02/16/21      PT LONG TERM GOAL #3   Title The patient will improve gait speed to > or equal to 1.8 ft/sec to demo dec'ing risk for falls.    Time 8    Period Weeks    Target Date 02/16/21      PT LONG TERM GOAL #4   Title The patient will ambulate with least restrictive assistive device x 600 ft demonstrating return to community surface ambulation mod indep.    Time 8    Period Weeks    Target Date 02/16/21      PT LONG TERM GOAL #5   Title The patient will demonstrate improved LE strength moving sit<>stand without UE support.    Time 8    Period Weeks    Target Date 02/16/21                 Plan - 12/22/20 1233    Clinical Impression Statement The patient tolerated greater activity today.  She has signfiicant L ankl eROM deficits due to recent fall with injury.  She also continues with pain in R hip and L foot from the fall she sustained.  She tolerated standing balance activities well today-- PT to progress to patient tolerance.    PT Treatment/Interventions ADLs/Self Care Home Management;Gait training;Stair training;Functional mobility training;Therapeutic activities;Therapeutic exercise;Balance training;Neuromuscular re-education;DME Instruction;Patient/family education;Orthotic Fit/Training;Taping;Manual techniques    PT Next Visit Plan standing balance, activity tolerance, cues for visual spotting during gait to reduce dizziness; ankle ROM as able (due to foot fx), hip strengthening    PT Home Exercise Plan Access Code: [place] and Agree with Plan of Care Patient           Patient will benefit from skilled therapeutic intervention in order to improve the following deficits and impairments:     Visit Diagnosis: Other abnormalities of gait and mobility  Muscle weakness (generalized)  Unsteadiness on feet  Other symptoms and signs involving the nervous system     Problem List Patient Active Problem List   Diagnosis Date Noted  . Labile hypertension 12/07/2020  . Unstable gait 12/07/2020  . Closed nondisplaced fracture of phalanx of toe of left foot 12/07/2020  . Bilateral chronic knee pain 12/07/2020  . Cerebral amyloid angiopathy (CODE) 12/07/2020  . Iron deficiency 05/23/2020  . Post herpetic neuralgia 02/09/2019  . 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 Caregiver burden 02/10/2018  . Mitral valve prolapse syndrome 08/15/2016  . Congestive heart failure ([place]) 08/15/2016  . Right cervical radiculopathy 08/09/2016  . Essential hypertension, malignant 06/12/2016  . History of stroke  03/13/2016  . 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 Narcolepsy 03/13/2013  . Coronary atherosclerosis 03/13/2013  . DDD (degenerative disc disease), lumbar   . HIRSUTISM 06/02/2009  . BIPOLAR DISORDER UNSPECIFIED 05/17/2009  . Pain in limb 08/03/2008  . Hyperlipidemia 07/15/2007  . FATIGUE 07/15/2007    WEAVER,CHRISTINA, PT 12/22/2020, 4:12 PM  [facility] [place], [name], [address] Phone: [phone]   Fax:  [phone]  Name: SABRE ROMBERGER MRN: [id number] Date of Birth: [birth year]

## 2020-12-25 NOTE — Progress Notes (Signed)
No hip fracture is visible.  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

## 2020-12-25 NOTE — Progress Notes (Signed)
Fractures present in the toes not significantly changed.

## 2020-12-25 NOTE — Progress Notes (Signed)
Toe fractures are visible.  No ankle fractures visible.

## 2020-12-26 ENCOUNTER — Ambulatory Visit (INDEPENDENT_AMBULATORY_CARE_PROVIDER_SITE_OTHER): Payer: Medicare Other | Admitting: Physical Therapy

## 2020-12-26 ENCOUNTER — Encounter: Payer: Self-pay | Admitting: Physical Therapy

## 2020-12-26 ENCOUNTER — Other Ambulatory Visit: Payer: Self-pay

## 2020-12-26 DIAGNOSIS — R2689 Other abnormalities of gait and mobility: Secondary | ICD-10-CM | POA: Diagnosis not present

## 2020-12-26 DIAGNOSIS — M6281 Muscle weakness (generalized): Secondary | ICD-10-CM | POA: Diagnosis not present

## 2020-12-26 DIAGNOSIS — R2681 Unsteadiness on feet: Secondary | ICD-10-CM

## 2020-12-26 NOTE — Therapy (Signed)
[place], [place], [address] Phone: [phone]   Fax:  [phone]  Physical Therapy Treatment  Patient Details  Name: [name] MRN: [id number] Date of Birth: [birth year] Referring Provider (PT): Gar Ponto, DO   Encounter Date: 12/26/2020   PT End of Session - 12/26/20 1236    Visit Number 3    Number of Visits 16    Date for PT Re-Evaluation 02/16/21    Authorization Type medicare, BCBS federal secondary    Progress Note Due on Visit 10    PT Start Time 1152    PT Stop Time 1230    PT Time Calculation (min) 38 min    Activity Tolerance Patient limited by fatigue    Behavior During Therapy [facility] for tasks assessed/performed           Past Medical History:  Diagnosis Date  . Allergy   . Anemia   . Anxiety   . Bipolar disorder ([place])   . CHF (congestive heart failure) ([place])   . Chronic diastolic heart failure (HCC)    hospitalized at [facility] regional  . Complication of anesthesia    had "breathing problems and ended up in ICU" 03/2010 report on chart  . COPD (chronic obstructive pulmonary disease) ([place])   . DDD (degenerative disc disease), lumbar   . Depression   . 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 GERD (gastroesophageal reflux disease)   . Glucose intolerance (impaired glucose tolerance)   . HLD (hyperlipidemia)    nec/nos  . HTN (hypertension)    benign essential  . MVP (mitral valve prolapse)   . Narcolepsy   . Stroke ([place])   . Suicide attempt ([place])    hx  . Syncope    admx to [facility] in 2/12: Echo with normal LVF; head CT unremarkable, ECG stable, no further w/u  . Ulcer     Past Surgical History:  Procedure Laterality Date  . AUGMENTATION MAMMAPLASTY  2003  . BLADDER SUSPENSION  2002  . DENTAL SURGERY  2016   Teeth implants x 4  . GASTRIC BYPASS  2014   at [place]    . LUMBAR LAMINECTOMY/DECOMPRESSION  MICRODISCECTOMY  09/11/2011   Procedure: LUMBAR LAMINECTOMY/DECOMPRESSION MICRODISCECTOMY;  Surgeon: Tobi Bastos;  Location: [facility];  Service: Orthopedics;  Laterality: Right;  Hemi Laminectomy/Microdiscectomy L5 - S1 on the Right (X-Ray)  . PARTIAL HYSTERECTOMY  1979  . REPAIR PERONEAL TENDONS ANKLE Left 2008   after injury     There were no vitals filed for this visit.   Subjective Assessment - 12/26/20 1154    Subjective Pt reports the exercise given for dizziness has helped. She has been doing her exercises daily.    Currently in Pain? Yes    Pain Score 5     Pain Location Foot    Pain Orientation Left    Pain Descriptors / Indicators Sore    Aggravating Factors  prolonged standing    Pain Relieving Factors soak, rubbing foot, elevating it.              [facility] PT Assessment - 12/26/20 0001      Assessment   Medical Diagnosis brainstem stroke    Referring Provider (PT) Gar Ponto, DO    Onset Date/Surgical Date 11/20/20            [facility] Adult PT Treatment/Exercise - 12/26/20 0001  Lumbar Exercises: Standing   Other Standing Lumbar Exercises sliding bilat arms up cabinets for arm/low back stretch x 5 reps      Lumbar Exercises: Seated   Long Arc Quad on Chair Right;Left;1 set;10 reps    Sit to Stand 10 reps   with UE support, decreasing UE use. cues for form.   Sit to Stand Limitations performed throughout session; cues for "stick bottom out, bend knees, reach back with hand".  Pt unable to control descent    Other Seated Lumbar Exercises neck lateral flexion and forward flexion stretches (x 10 sec each position to resolve tightness from neck ext in standing)           Balance Exercises - 12/26/20 0001      Balance Exercises: Standing   Sidestepping Upper extremity support;3 reps      OTAGO PROGRAM   Trunk Movements Standing;5 reps   trunk rotation, looking over shoulder   Knee Flexor 10 reps      Balance Exercises: Seated   Heel Raises Both;10  reps    Toe Raise Both;10 reps    Other Seated Exercises ankle circles CW x 10 each               PT Short Term Goals - 12/18/20 1642      PT SHORT TERM GOAL #1   Title The patient will be indep with HEP for LE strengthening, coordination, balance, and general mobility.    Time 4    Period Weeks    Target Date 01/17/21      PT SHORT TERM GOAL #2   Title The patient will improve Berg balance score from 14/56 to > or equal to 26/56 indicating reduced risk for falls.    Time 4    Period Weeks    Target Date 01/17/21      PT SHORT TERM GOAL #3   Title The patient will improve gait speed from 0.92 ft/sec to > or equal to 1.4 ft/sec demonstrating improved functional mobility.    Time 4    Period Weeks    Target Date 01/17/21      PT SHORT TERM GOAL #4   Title The patient will ambulate household surfaces with SPC mod indep x 300 ft.    Time 4    Period Weeks    Target Date 01/17/21             PT Long Term Goals - 12/18/20 1647      PT LONG TERM GOAL #1   Title The patient will be indep with progression of HEP.    Time 8    Period Weeks    Target Date 02/16/21      PT LONG TERM GOAL #2   Title The patient will improve Berg balance score to > or equal to 36/56 to demo dec'd risk for falls.    Time 8    Period Weeks    Target Date 02/16/21      PT LONG TERM GOAL #3   Title The patient will improve gait speed to > or equal to 1.8 ft/sec to demo dec'ing risk for falls.    Time 8    Period Weeks    Target Date 02/16/21      PT LONG TERM GOAL #4   Title The patient will ambulate with least restrictive assistive device x 600 ft demonstrating return to community surface ambulation mod indep.    Time 8  Period Weeks    Target Date 02/16/21      PT LONG TERM GOAL #5   Title The patient will demonstrate improved LE strength moving sit<>stand without UE support.    Time 8    Period Weeks    Target Date 02/16/21                 Plan - 12/26/20 1237     Clinical Impression Statement Pt demonstrated difficulty with sit to/from stand with reduced use of UE to control descent.  She fatigues with standing exercises.  Alternated between seated and standing exercises throughout.  Only one episode of dizziness with looking up while performing shoulder flexion stretch on cabinets; resolved quickly but then pt reported slight headache.  Will continue to progress to patient tolerance.    Rehab Potential Good    PT Frequency 2x / week    PT Duration 8 weeks    PT Treatment/Interventions ADLs/Self Care Home Management;Gait training;Stair training;Functional mobility training;Therapeutic activities;Therapeutic exercise;Balance training;Neuromuscular re-education;DME Instruction;Patient/family education;Orthotic Fit/Training;Taping;Manual techniques    PT Next Visit Plan standing balance, activity tolerance, cues for visual spotting during gait to reduce dizziness; ankle ROM as able (due to foot fx), hip strengthening    PT Home Exercise Plan Access Code: [place] and Agree with Plan of Care Patient           Patient will benefit from skilled therapeutic intervention in order to improve the following deficits and impairments:  Pain,Impaired flexibility,Abnormal gait,Decreased coordination,Difficulty walking,Dizziness,Decreased activity tolerance,Decreased balance,Decreased mobility,Decreased strength,Postural dysfunction,Impaired sensation  Visit Diagnosis: Muscle weakness (generalized)  Other abnormalities of gait and mobility  Unsteadiness on feet     Problem List Patient Active Problem List   Diagnosis Date Noted  . Labile hypertension 12/07/2020  . Unstable gait 12/07/2020  . Closed nondisplaced fracture of phalanx of toe of left foot 12/07/2020  . Bilateral chronic knee pain 12/07/2020  . Cerebral amyloid angiopathy (CODE) 12/07/2020  . Iron deficiency 05/23/2020  . Post herpetic neuralgia 02/09/2019  . Female pattern hair  loss 02/09/2019  . Caregiver burden 02/10/2018  . Mitral valve prolapse syndrome 08/15/2016  . Congestive heart failure ([place]) 08/15/2016  . Right cervical radiculopathy 08/09/2016  . Essential hypertension, malignant 06/12/2016  . History of stroke 03/13/2016  . 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 Narcolepsy 03/13/2013  . Coronary atherosclerosis 03/13/2013  . DDD (degenerative disc disease), lumbar   . HIRSUTISM 06/02/2009  . BIPOLAR DISORDER UNSPECIFIED 05/17/2009  . Pain in limb 08/03/2008  . Hyperlipidemia 07/15/2007  . FATIGUE 07/15/2007   Kerin Perna, PTA 12/26/20 12:47 PM  [place], [place], [address] Phone: [phone]   Fax:  [phone]  Name: [name] MRN: [id number] Date of Birth: [birth year]

## 2020-12-26 NOTE — Patient Instructions (Signed)
Access Code: [email or web address] URL: [email or web address] Date: 12/26/2020 Prepared by: Lexa  Exercises Supine Lower Trunk Rotation - 2 x daily - 7 x weekly - 1 sets - 10 reps Supine March - 2 x daily - 7 x weekly - 1 sets - 10 reps Seated Gaze Stabilization with Head Rotation - 2 x daily - 7 x weekly - 1 sets - 10 reps Side Stepping with Counter Support - 1 x daily - 7 x weekly - 2 sets Seated Ankle Circles - 1 x daily - 7 x weekly - 1 sets - 10 reps Sit to Stand with Armchair - 5 x daily - 7 x weekly

## 2020-12-28 ENCOUNTER — Ambulatory Visit (INDEPENDENT_AMBULATORY_CARE_PROVIDER_SITE_OTHER): Payer: Medicare Other | Admitting: Rehabilitative and Restorative Service Providers"

## 2020-12-28 ENCOUNTER — Other Ambulatory Visit: Payer: Self-pay

## 2020-12-28 DIAGNOSIS — R2681 Unsteadiness on feet: Secondary | ICD-10-CM | POA: Diagnosis not present

## 2020-12-28 DIAGNOSIS — R2689 Other abnormalities of gait and mobility: Secondary | ICD-10-CM

## 2020-12-28 DIAGNOSIS — M6281 Muscle weakness (generalized): Secondary | ICD-10-CM

## 2020-12-28 DIAGNOSIS — R29818 Other symptoms and signs involving the nervous system: Secondary | ICD-10-CM | POA: Diagnosis not present

## 2020-12-28 NOTE — Therapy (Signed)
[place], [place], [address] Phone: [phone]   Fax:  [phone]  Physical Therapy Treatment  Patient Details  Name: [name] MRN: [id number] Date of Birth: 10/21/1955 Referring Provider (PT): Gar Ponto, DO   Encounter Date: 12/28/2020   PT End of Session - 12/28/20 1407    Visit Number 4    Number of Visits 16    Date for PT Re-Evaluation 02/16/21    Authorization Type medicare, BCBS federal secondary    Progress Note Due on Visit 10    PT Start Time 1402    PT Stop Time 1442    PT Time Calculation (min) 40 min    Activity Tolerance Patient limited by fatigue    Behavior During Therapy [facility] for tasks assessed/performed           Past Medical History:  Diagnosis Date  . Allergy   . Anemia   . Anxiety   . Bipolar disorder ([place])   . CHF (congestive heart failure) ([place])   . Chronic diastolic heart failure (HCC)    hospitalized at [facility] regional  . Complication of anesthesia    had "breathing problems and ended up in ICU" 03/2010 report on chart  . COPD (chronic obstructive pulmonary disease) ([place])   . DDD (degenerative disc disease), lumbar   . Depression   . 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 GERD (gastroesophageal reflux disease)   . Glucose intolerance (impaired glucose tolerance)   . HLD (hyperlipidemia)    nec/nos  . HTN (hypertension)    benign essential  . MVP (mitral valve prolapse)   . Narcolepsy   . Stroke ([place])   . Suicide attempt ([place])    hx  . Syncope    admx to [facility] in 2/12: Echo with normal LVF; head CT unremarkable, ECG stable, no further w/u  . Ulcer     Past Surgical History:  Procedure Laterality Date  . AUGMENTATION MAMMAPLASTY  2003  . BLADDER SUSPENSION  2002  . DENTAL SURGERY  2016   Teeth implants x 4  . GASTRIC BYPASS  2014   at [place]    . LUMBAR LAMINECTOMY/DECOMPRESSION  MICRODISCECTOMY  09/11/2011   Procedure: LUMBAR LAMINECTOMY/DECOMPRESSION MICRODISCECTOMY;  Surgeon: Tobi Bastos;  Location: [facility];  Service: Orthopedics;  Laterality: Right;  Hemi Laminectomy/Microdiscectomy L5 - S1 on the Right (X-Ray)  . PARTIAL HYSTERECTOMY  1979  . REPAIR PERONEAL TENDONS ANKLE Left 2008   after injury     There were no vitals filed for this visit.   Subjective Assessment - 12/28/20 1405    Subjective The patient reports she feels like she is in a fog in the morning and has occasional veering during gait, however she feels dizziness is improving.  Her foot is also feeling better.    Pertinent History bipolar disorder, HTN, CHF, prolapsed mitral valve, insomnia, neuropathy    Patient Stated Goals "If I can link my brain and body to walk straight, not feeling drunk."  She likes to care for her dogs and pets, was independent with driving, shopping, tasks.    Currently in Pain? Yes    Pain Score 2     Pain Location Foot    Pain Orientation Left    Pain Descriptors / Indicators Sore    Pain Type Acute pain    Pain Onset 1 to 4 weeks ago  Pain Frequency Intermittent    Aggravating Factors  prolonged standing , squeezing foot    Pain Location Leg    Pain Orientation Right    Pain Descriptors / Indicators Sore              [facility] PT Assessment - 12/28/20 1408      Assessment   Medical Diagnosis brainstem stroke    Referring Provider (PT) Gar Ponto, DO    Onset Date/Surgical Date 11/20/20                         [facility] Adult PT Treatment/Exercise - 12/28/20 1408      Ambulation/Gait   Ambulation/Gait Yes    Ambulation/Gait Assistance 6: Modified independent (Device/Increase time)    Ambulation Distance (Feet) 100 Feet    Assistive device Rollator      Neuro Re-ed    Neuro Re-ed Details  Standing with feet narrow with horizontal head turns reacing to grab a ball R and L high, medium, and low heights.  Worked on wall bumps with  cues on posture and min A due to L leaning tendency.  Standing moving L foot into/out of stride with min A for balance.  Attempted physioball sitting with min A to mod A due to L leaning with a chair and mat surface for UE support.  The patient needed greater assist to transfer back to the mat-- not able to tolerate physioball seated well.      Exercises   Exercises Knee/Hip;Other Exercises    Other Exercises  slow stand>sit for eccentric control lowering      Lumbar Exercises: Aerobic   Nustep L 5 x 3 minutes UEs/LEs      Lumbar Exercises: Supine   Bridge 10 reps    Straight Leg Raise 10 reps      Lumbar Exercises: Quadruped   Other Quadruped Lumbar Exercises quadriped rocking and alternating LE hip extension with min A for stabilization                    PT Short Term Goals - 12/18/20 1642      PT SHORT TERM GOAL #1   Title The patient will be indep with HEP for LE strengthening, coordination, balance, and general mobility.    Time 4    Period Weeks    Target Date 01/17/21      PT SHORT TERM GOAL #2   Title The patient will improve Berg balance score from 14/56 to > or equal to 26/56 indicating reduced risk for falls.    Time 4    Period Weeks    Target Date 01/17/21      PT SHORT TERM GOAL #3   Title The patient will improve gait speed from 0.92 ft/sec to > or equal to 1.4 ft/sec demonstrating improved functional mobility.    Time 4    Period Weeks    Target Date 01/17/21      PT SHORT TERM GOAL #4   Title The patient will ambulate household surfaces with SPC mod indep x 300 ft.    Time 4    Period Weeks    Target Date 01/17/21             PT Long Term Goals - 12/18/20 1647      PT LONG TERM GOAL #1   Title The patient will be indep with progression of HEP.    Time 8  Period Weeks    Target Date 02/16/21      PT LONG TERM GOAL #2   Title The patient will improve Berg balance score to > or equal to 36/56 to demo dec'd risk for falls.    Time 8     Period Weeks    Target Date 02/16/21      PT LONG TERM GOAL #3   Title The patient will improve gait speed to > or equal to 1.8 ft/sec to demo dec'ing risk for falls.    Time 8    Period Weeks    Target Date 02/16/21      PT LONG TERM GOAL #4   Title The patient will ambulate with least restrictive assistive device x 600 ft demonstrating return to community surface ambulation mod indep.    Time 8    Period Weeks    Target Date 02/16/21      PT LONG TERM GOAL #5   Title The patient will demonstrate improved LE strength moving sit<>stand without UE support.    Time 8    Period Weeks    Target Date 02/16/21                 Plan - 12/28/20 1432    Clinical Impression Statement The patient fatigues quickly during therapy.  With balance activities, she requires min A due to left leaning/pulling sensations.  PT to continue to progress activities to patient tolerance.    PT Treatment/Interventions ADLs/Self Care Home Management;Gait training;Stair training;Functional mobility training;Therapeutic activities;Therapeutic exercise;Balance training;Neuromuscular re-education;DME Instruction;Patient/family education;Orthotic Fit/Training;Taping;Manual techniques    PT Next Visit Plan standing balance, activity tolerance, cues for visual spotting during gait to reduce dizziness; ankle ROM as able (due to foot fx), hip strengthening    PT Home Exercise Plan Access Code: [place]           Patient will benefit from skilled therapeutic intervention in order to improve the following deficits and impairments:     Visit Diagnosis: Muscle weakness (generalized)  Other abnormalities of gait and mobility  Unsteadiness on feet  Other symptoms and signs involving the nervous system     Problem List Patient Active Problem List   Diagnosis Date Noted  . Labile hypertension 12/07/2020  . Unstable gait 12/07/2020  . Closed nondisplaced fracture of phalanx of toe of left foot  12/07/2020  . Bilateral chronic knee pain 12/07/2020  . Cerebral amyloid angiopathy (CODE) 12/07/2020  . Iron deficiency 05/23/2020  . Post herpetic neuralgia 02/09/2019  . 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 Caregiver burden 02/10/2018  . Mitral valve prolapse syndrome 08/15/2016  . Congestive heart failure ([place]) 08/15/2016  . Right cervical radiculopathy 08/09/2016  . Essential hypertension, malignant 06/12/2016  . History of stroke 03/13/2016  . 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 Narcolepsy 03/13/2013  . Coronary atherosclerosis 03/13/2013  . DDD (degenerative disc disease), lumbar   . HIRSUTISM 06/02/2009  . BIPOLAR DISORDER UNSPECIFIED 05/17/2009  . Pain in limb 08/03/2008  . Hyperlipidemia 07/15/2007  . FATIGUE 07/15/2007    Tymier Lindholm, PT 12/28/2020, 2:47 PM  [facility] [place], [place], [address] Phone: [phone]   Fax:  [phone]  Name: KYRA LAFFEY MRN: [id number] Date of Birth: 09-Aug-1956

## 2021-01-01 ENCOUNTER — Other Ambulatory Visit: Payer: Self-pay

## 2021-01-01 ENCOUNTER — Encounter: Payer: Medicare Other | Admitting: Rehabilitative and Restorative Service Providers"

## 2021-01-02 ENCOUNTER — Other Ambulatory Visit: Payer: Self-pay | Admitting: Family Medicine

## 2021-01-02 ENCOUNTER — Telehealth: Payer: Self-pay

## 2021-01-02 MED ORDER — LOSARTAN POTASSIUM 100 MG PO TABS: 100.0000 mg | ORAL_TABLET | Freq: Every day | ORAL | 0 refills | Status: DC

## 2021-01-02 NOTE — Telephone Encounter (Signed)
[name] needs a refill on Losartan. She was given a prescription in the hospital. Historical provider.

## 2021-01-03 ENCOUNTER — Other Ambulatory Visit: Payer: Self-pay | Admitting: Family Medicine

## 2021-01-03 NOTE — Telephone Encounter (Signed)
lvm advising pt that her medication was sent on yesterday to [place] told pt to call pharmacy

## 2021-01-04 ENCOUNTER — Other Ambulatory Visit: Payer: Self-pay

## 2021-01-04 ENCOUNTER — Ambulatory Visit (INDEPENDENT_AMBULATORY_CARE_PROVIDER_SITE_OTHER): Payer: Medicare Other | Admitting: Rehabilitative and Restorative Service Providers"

## 2021-01-04 VITALS — BP 128/81 | HR 86

## 2021-01-04 DIAGNOSIS — M6281 Muscle weakness (generalized): Secondary | ICD-10-CM

## 2021-01-04 DIAGNOSIS — R2689 Other abnormalities of gait and mobility: Secondary | ICD-10-CM

## 2021-01-04 DIAGNOSIS — R2681 Unsteadiness on feet: Secondary | ICD-10-CM

## 2021-01-04 DIAGNOSIS — R29818 Other symptoms and signs involving the nervous system: Secondary | ICD-10-CM | POA: Diagnosis not present

## 2021-01-04 NOTE — Therapy (Addendum)
[place], [place], [address] Phone: [phone]   Fax:  [phone]  Physical Therapy Treatment and Discharge Summary  Patient Details  Name: [name] MRN: [id number] Date of Birth: 02-Apr-1956 Referring Provider (PT): Gar Ponto, DO   PHYSICAL THERAPY DISCHARGE SUMMARY  Visits from Start of Care: 5  Current functional level related to goals / functional outcomes: See goals and patient status below-- did not return.   Remaining deficits: See note below for last known patient status.   Education / Equipment: HEP, fall safety.  Plan: Patient agrees to discharge.  Patient goals were not met. Patient is being discharged due to not returning since the last visit.  ?????         Thank you for the referral of this patient. Rudell Cobb, MPT   Encounter Date: 01/04/2021   PT End of Session - 01/04/21 1445    Visit Number 5    Number of Visits 16    Date for PT Re-Evaluation 02/16/21    Authorization Type medicare, BCBS federal secondary    Progress Note Due on Visit 10    PT Start Time 1400    PT Stop Time 1442    PT Time Calculation (min) 42 min           Past Medical History:  Diagnosis Date  . Allergy   . Anemia   . Anxiety   . Bipolar disorder ([place])   . CHF (congestive heart failure) ([place])   . Chronic diastolic heart failure (HCC)    hospitalized at [facility] regional  . Complication of anesthesia    had "breathing problems and ended up in ICU" 03/2010 report on chart  . COPD (chronic obstructive pulmonary disease) ([place])   . DDD (degenerative disc disease), lumbar   . Depression   . 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 GERD (gastroesophageal reflux disease)   . Glucose intolerance (impaired glucose tolerance)   . HLD (hyperlipidemia)    nec/nos  . HTN (hypertension)    benign essential  . MVP (mitral valve prolapse)   . Narcolepsy   . Stroke ([place])    . Suicide attempt ([place])    hx  . Syncope    admx to [facility] in 2/12: Echo with normal LVF; head CT unremarkable, ECG stable, no further w/u  . Ulcer     Past Surgical History:  Procedure Laterality Date  . AUGMENTATION MAMMAPLASTY  2003  . BLADDER SUSPENSION  2002  . DENTAL SURGERY  2016   Teeth implants x 4  . GASTRIC BYPASS  2014   at [place]    . LUMBAR LAMINECTOMY/DECOMPRESSION MICRODISCECTOMY  09/11/2011   Procedure: LUMBAR LAMINECTOMY/DECOMPRESSION MICRODISCECTOMY;  Surgeon: Tobi Bastos;  Location: [facility];  Service: Orthopedics;  Laterality: Right;  Hemi Laminectomy/Microdiscectomy L5 - S1 on the Right (X-Ray)  . PARTIAL HYSTERECTOMY  1979  . REPAIR PERONEAL TENDONS ANKLE Left 2008   after injury     Vitals:   01/04/21 1432  BP: 128/81  Pulse: 86     Subjective Assessment - 01/04/21 1404    Subjective The patient reports she is improved with R hip pain--now just a little sore, and her foot feels improved as well. She walks sometimes at home without cast shoe on her foot.  Dizziness is improved, but can still occur with quick head motion.    Pertinent History  bipolar disorder, HTN, CHF, prolapsed mitral valve, insomnia, neuropathy    Patient Stated Goals "If I can link my brain and body to walk straight, not feeling drunk."  She likes to care for her dogs and pets, was independent with driving, shopping, tasks.    Currently in Pain? Yes    Pain Location Foot    Pain Orientation Left    Pain Descriptors / Indicators Sore    Pain Type Acute pain    Pain Onset 1 to 4 weeks ago    Pain Frequency Intermittent    Aggravating Factors  prolonged standing, squeezing foot    Pain Relieving Factors soak, rubbing foot    Multiple Pain Sites Yes    Pain Location Leg    Pain Descriptors / Indicators Sore    Pain Onset 1 to 4 weeks ago    Pain Frequency Intermittent              [facility] PT Assessment - 01/04/21 1407       Assessment   Medical Diagnosis brainstem stroke    Referring Provider (PT) Gar Ponto, DO    Onset Date/Surgical Date 11/20/20    Hand Dominance Left      Berg Balance Test   Sit to Stand Able to stand  independently using hands    Standing Unsupported Able to stand 2 minutes with supervision    Sitting with Back Unsupported but Feet Supported on Floor or Stool Able to sit safely and securely 2 minutes    Stand to Sit Controls descent by using hands    Transfers Able to transfer safely, definite need of hands    Standing Unsupported with Eyes Closed Able to stand 3 seconds    Standing Unsupported with Feet Together Able to place feet together independently and stand for 1 minute with supervision    From Standing, Reach Forward with Outstretched Arm Can reach forward >5 cm safely (2")    From Standing Position, Pick up Object from Floor Unable to try/needs assist to keep balance    From Standing Position, Turn to Look Behind Over each Shoulder Needs assist to keep from losing balance and falling    Turn 360 Degrees Needs assistance while turning    Standing Unsupported, Alternately Place Feet on Step/Stool Needs assistance to keep from falling or unable to try    Standing Unsupported, One Foot in Front Needs help to step but can hold 15 seconds    Standing on One Leg Unable to try or needs assist to prevent fall    Total Score 24    Berg comment: improved from 14/56 up to 24/56 (since 12/18/20)                         [facility] Adult PT Treatment/Exercise - 01/04/21 1407      Ambulation/Gait   Ambulation/Gait Yes    Ambulation/Gait Assistance 6: Modified independent (Device/Increase time);4: Min guard    Ambulation Distance (Feet) 400 Feet    Assistive device Rollator;Straight cane    Ambulation Surface Level;Indoor    Gait Comments Patient no  longer walking with antalgic pattern.      Neuro Re-ed    Neuro Re-ed Details  Standing in front of wall with hip bumps  working on ant/posterior weight shift and midline orientation;   rocker board standing with min to mod A due to "tweety bird feeling" in her head *this provokes the sensation of imbalance and dizziness  requiring a seated rest break.      Exercises   Exercises Knee/Hip;Other Exercises    Other Exercises  slow stand>sit improving x 2 reps, however has knee pain.      Lumbar Exercises: Aerobic   Nustep L5 x 4.5 minutes      Lumbar Exercises: Standing   Wall Slides 10 reps      Lumbar Exercises: Seated   Sit to Stand --   2 reps, then modified to wall squat          Vestibular Treatment/Exercise - 01/04/21 1426      Vestibular Treatment/Exercise   Vestibular Treatment Provided Habituation    Habituation Exercises Seated Diagonal Head Turns;Comment   also performed seated reaching for habituation x 5 reps     Seated Diagonal Head Turns   Number of Reps 5    Symptoms Description  during rest breaks from standing activities, worked on diagonal head motion x 2 sets to reduce motion provoked dizziness from centrally mediated dizziness.                   PT Short Term Goals - 12/18/20 1642      PT SHORT TERM GOAL #1   Title The patient will be indep with HEP for LE strengthening, coordination, balance, and general mobility.    Time 4    Period Weeks    Target Date 01/17/21      PT SHORT TERM GOAL #2   Title The patient will improve Berg balance score from 14/56 to > or equal to 26/56 indicating reduced risk for falls.    Time 4    Period Weeks    Target Date 01/17/21      PT SHORT TERM GOAL #3   Title The patient will improve gait speed from 0.92 ft/sec to > or equal to 1.4 ft/sec demonstrating improved functional mobility.    Time 4    Period Weeks    Target Date 01/17/21      PT SHORT TERM GOAL #4   Title The patient will ambulate household surfaces with SPC mod indep x 300 ft.    Time 4    Period Weeks    Target Date 01/17/21             PT Long Term  Goals - 12/18/20 1647      PT LONG TERM GOAL #1   Title The patient will be indep with progression of HEP.    Time 8    Period Weeks    Target Date 02/16/21      PT LONG TERM GOAL #2   Title The patient will improve Berg balance score to > or equal to 36/56 to demo dec'd risk for falls.    Time 8    Period Weeks    Target Date 02/16/21      PT LONG TERM GOAL #3   Title The patient will improve gait speed to > or equal to 1.8 ft/sec to demo dec'ing risk for falls.    Time 8    Period Weeks    Target Date 02/16/21      PT LONG TERM GOAL #4   Title The patient will ambulate with least restrictive assistive device x 600 ft demonstrating return to community surface ambulation mod indep.    Time 8    Period Weeks    Target Date 02/16/21      PT LONG TERM GOAL #5   Title The  patient will demonstrate improved LE strength moving sit<>stand without UE support.    Time 8    Period Weeks    Target Date 02/16/21                 Plan - 01/04/21 1438    Clinical Impression Statement The patient tolerated therapy well today demonstrating improved endurance during standing tasks and improved gait with SPC.  PT progressed balance activities from solid surface to compliant surface standing on a rocker board.  This brings on a moderate sensation of dizziness that takes minutes to recover.  The patient has improved from 14/56 up to 24/56 on Berg balance scale.  She continues to be in high fall risk category, but is making great progress with mobility. She has returned to independent bathing, dressing, and cooking with assist around the oven to get dishes/in and out of the oven.  continue to LTGs.    PT Treatment/Interventions ADLs/Self Care Home Management;Gait training;Stair training;Functional mobility training;Therapeutic activities;Therapeutic exercise;Balance training;Neuromuscular re-education;DME Instruction;Patient/family education;Orthotic Fit/Training;Taping;Manual techniques    PT  Next Visit Plan standing balance, activity tolerance, cues for visual spotting during gait to reduce dizziness; ankle ROM as able (due to foot fx), hip strengthening    PT Home Exercise Plan Access Code: [place] and Agree with Plan of Care Patient           Patient will benefit from skilled therapeutic intervention in order to improve the following deficits and impairments:     Visit Diagnosis: No diagnosis found.     Problem List Patient Active Problem List   Diagnosis Date Noted  . Labile hypertension 12/07/2020  . Unstable gait 12/07/2020  . Closed nondisplaced fracture of phalanx of toe of left foot 12/07/2020  . Bilateral chronic knee pain 12/07/2020  . Cerebral amyloid angiopathy (CODE) 12/07/2020  . Iron deficiency 05/23/2020  . Post herpetic neuralgia 02/09/2019  . 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 Caregiver burden 02/10/2018  . Mitral valve prolapse syndrome 08/15/2016  . Congestive heart failure ([place]) 08/15/2016  . Right cervical radiculopathy 08/09/2016  . Essential hypertension, malignant 06/12/2016  . History of stroke 03/13/2016  . 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 Narcolepsy 03/13/2013  . Coronary atherosclerosis 03/13/2013  . DDD (degenerative disc disease), lumbar   . HIRSUTISM 06/02/2009  . BIPOLAR DISORDER UNSPECIFIED 05/17/2009  . Pain in limb 08/03/2008  . Hyperlipidemia 07/15/2007  . FATIGUE 07/15/2007    Zoila Ditullio 01/04/2021, 2:46 PM  [facility] [place], [place], [address] Phone: [phone]   Fax:  [phone]  Name: CHRISANNA MISHRA MRN: [id number] Date of Birth: [birth year]

## 2021-01-04 NOTE — Progress Notes (Deleted)
   I, [name], LAT, ATC acting as a scribe for [name], MD.  [name] is a 65 y.o. female who presents to [place] at [facility] today for L foot and ankle pain. Pt was last seen by Dr. Georgina Snell on 12/21/20 and was advised to wear ASO ankle brace w/ postop shoe and add treatment on to PT order, of which she's completed 4 visits. Today, pt reports   Dx imaging: 12/07/20 L foot XR, L & R knee XR             06/16/19 L knee XR  Pertinent review of systems: ***  Relevant historical information: ***   Exam:  There were no vitals taken for this visit. General: Well Developed, well nourished, and in no acute distress.   MSK: ***    Lab and Radiology Results No results found for this or any previous visit (from the past 72 hour(s)). No results found.     Assessment and Plan: 65 y.o. female with ***   PDMP not reviewed this encounter. No orders of the defined types were placed in this encounter.  No orders of the defined types were placed in this encounter.    Discussed warning signs or symptoms. Please see discharge instructions. Patient expresses understanding.   ***

## 2021-01-05 ENCOUNTER — Ambulatory Visit: Payer: Federal, State, Local not specified - PPO | Admitting: Family Medicine

## 2021-01-10 ENCOUNTER — Ambulatory Visit: Payer: Federal, State, Local not specified - PPO | Admitting: Family Medicine

## 2021-01-10 DIAGNOSIS — I2510 Atherosclerotic heart disease of native coronary artery without angina pectoris: Secondary | ICD-10-CM | POA: Diagnosis not present

## 2021-01-10 DIAGNOSIS — I509 Heart failure, unspecified: Secondary | ICD-10-CM | POA: Diagnosis not present

## 2021-01-10 DIAGNOSIS — I472 Ventricular tachycardia: Secondary | ICD-10-CM | POA: Diagnosis not present

## 2021-01-10 DIAGNOSIS — M25572 Pain in left ankle and joints of left foot: Secondary | ICD-10-CM

## 2021-01-10 NOTE — Progress Notes (Signed)
Patient had a scheduled telephone call today but after several phone calls did not answer her phone.

## 2021-01-11 ENCOUNTER — Other Ambulatory Visit: Payer: Self-pay | Admitting: Cardiology

## 2021-01-23 ENCOUNTER — Ambulatory Visit (INDEPENDENT_AMBULATORY_CARE_PROVIDER_SITE_OTHER): Payer: Medicare Other | Admitting: Family Medicine

## 2021-01-23 ENCOUNTER — Other Ambulatory Visit: Payer: Self-pay

## 2021-01-23 VITALS — BP 114/64 | HR 70 | Ht 66.0 in | Wt 206.0 lb

## 2021-01-23 DIAGNOSIS — I10 Essential (primary) hypertension: Secondary | ICD-10-CM

## 2021-01-23 DIAGNOSIS — I509 Heart failure, unspecified: Secondary | ICD-10-CM

## 2021-01-23 DIAGNOSIS — Z8673 Personal history of transient ischemic attack (TIA), and cerebral infarction without residual deficits: Secondary | ICD-10-CM

## 2021-01-23 MED ORDER — CLONIDINE 0.1 MG/24HR TD PTWK: 0.1000 mg | MEDICATED_PATCH | TRANSDERMAL | 1 refills | Status: DC

## 2021-01-23 MED ORDER — CLOPIDOGREL BISULFATE 75 MG PO TABS: 75.0000 mg | ORAL_TABLET | Freq: Every day | ORAL | 0 refills | Status: DC

## 2021-01-23 MED ORDER — HYDRALAZINE HCL 100 MG PO TABS: 100.0000 mg | ORAL_TABLET | Freq: Three times a day (TID) | ORAL | 1 refills | Status: DC

## 2021-01-23 MED ORDER — AMLODIPINE-OLMESARTAN 10-40 MG PO TABS: 1.0000 | ORAL_TABLET | Freq: Every day | ORAL | 1 refills | Status: DC

## 2021-01-23 NOTE — Progress Notes (Signed)
Established Patient Office Visit  Subjective:  Patient ID: [name], female    DOB: [birth year]  Age: 65 y.o. MRN: [id number]  CC:  Chief Complaint  Patient presents with  . Hypertension    HPI [name] presents for   Hypertension- Pt denies chest pain, SOB, dizziness, or heart palpitations.  Taking meds as directed w/o problems.  Denies medication side effects.    Following with Dr. Georgina Snell for her foot fractures on the left.   Struggling with some generalized lower extremity weakness and unsteadiness on her feet.  She did go to rehab for several visits but says on her last visit they did not give her follow-up so she was not sure if she was discharged or not.  He has noticed some easy bruising since being on the blood thinner.  No blood when she brushes her teeth.  Recent stroke-she did follow-up with Dr. Beckey Rutter, neurologist here locally.  He did place her on a heart monitor and is planning on for follow-up in June.  She said she needs some refills on some of her prescriptions that she was given in the emergency department.  She was discharged home on losartan but it sounds like she has recently refilled her omelsartan/amlodipine instead.  There was actually some significant confusion around her medications when she was here last and had asked her to bring in her home pills but she forgot.  She also complains of some right-sided soreness on her upper back below her shoulder blade area she is wants to make sure that it is not shingles again because it feels similar.  Though she has not noted or felt a rash.  She says it also most feels like she is starting to get a bump or abscess.  Past Medical History:  Diagnosis Date  . Allergy   . Anemia   . Anxiety   . Bipolar disorder ([place])   . CHF (congestive heart failure) ([place])   . Chronic diastolic heart failure (HCC)    hospitalized at [facility]  . Complication of anesthesia    had "breathing problems and ended up in  ICU" 03/2010 report on chart  . COPD (chronic obstructive pulmonary disease) ([place])   . DDD (degenerative disc disease), lumbar   . Depression   . 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 GERD (gastroesophageal reflux disease)   . Glucose intolerance (impaired glucose tolerance)   . HLD (hyperlipidemia)    nec/nos  . HTN (hypertension)    benign essential  . MVP (mitral valve prolapse)   . Narcolepsy   . Stroke ([place])   . Suicide attempt ([place])    hx  . Syncope    admx to [facility] in 2/12: Echo with normal LVF; head CT unremarkable, ECG stable, no further w/u  . Ulcer     Past Surgical History:  Procedure Laterality Date  . AUGMENTATION MAMMAPLASTY  2003  . BLADDER SUSPENSION  2002  . DENTAL SURGERY  2016   Teeth implants x 4  . GASTRIC BYPASS  2014   at [place]    . LUMBAR LAMINECTOMY/DECOMPRESSION MICRODISCECTOMY  09/11/2011   Procedure: LUMBAR LAMINECTOMY/DECOMPRESSION MICRODISCECTOMY;  Surgeon: Tobi Bastos;  Location: [facility];  Service: Orthopedics;  Laterality: Right;  Hemi Laminectomy/Microdiscectomy L5 - S1 on the Right (X-Ray)  . PARTIAL HYSTERECTOMY  1979  . REPAIR PERONEAL TENDONS ANKLE Left 2008   after injury  Family History  Problem Relation Age of Onset  . Heart attack Father 70  . Hypertension Father   . Diabetes Mother   . Hypertension Mother   . Stroke Other        grandmother  . Colon cancer Neg Hx   . Esophageal cancer Neg Hx   . Stomach cancer Neg Hx   . Rectal cancer Neg Hx     Social History   Socioeconomic History  . Marital status: Married    Spouse name: Not on file  . Number of children: Not on file  . Years of education: Not on file  . Highest education level: Not on file  Occupational History  . Not on file  Tobacco Use  . Smoking status: Former Smoker    Quit date: 10/08/2003    Years since quitting: 17.3  . Smokeless tobacco: Never Used  Vaping Use  . Vaping Use: Never  used  Substance and Sexual Activity  . Alcohol use: Yes    Comment: rare  . Drug use: No  . Sexual activity: Not on file  Other Topics Concern  . Not on file  Social History Narrative   Regularly exercises.    Social Determinants of Health   Financial Resource Strain: Not on file  Food Insecurity: Not on file  Transportation Needs: Not on file  Physical Activity: Not on file  Stress: Not on file  Social Connections: Not on file  Intimate Partner Violence: Not on file    Outpatient Medications Prior to Visit  Medication Sig Dispense Refill  . AMBULATORY NON FORMULARY MEDICATION Walker. Use as needed. Disp 1.  Unable gait R26.81 1 each 0  . amitriptyline (ELAVIL) 10 MG tablet Take 2 tablets by mouth every evening 60 tablet 11  . aspirin 81 MG EC tablet Take 1 tablet by mouth every day 90 tablet 1  . atorvastatin (LIPITOR) 80 MG tablet Take 1 tablet (80 mg total) by mouth at bedtime. NEED OV. 30 tablet 2  . busPIRone (BUSPAR) 10 MG tablet Take 10 mg by mouth 3 (three) times daily.    . DULoxetine (CYMBALTA) 60 MG capsule Take 1 capsule by mouth every evening 30 capsule 11  . esomeprazole (NEXIUM) 40 MG capsule Take 40 mg by mouth 2 (two) times daily as needed. 1 capsule daily scheduled, will take an additional capsule if needed    . finasteride (PROPECIA) 1 MG tablet Take 1 tablet (1 mg total) by mouth daily. 1 tablet 0  . furosemide (LASIX) 40 MG tablet Take 40 mg by mouth 2 (two) times daily.    [name] gabapentin (NEURONTIN) 800 MG tablet Take 1 tablet by mouth 3 times a day 90 tablet 6  . labetalol (NORMODYNE) 300 MG tablet Take 0.5 tablets (150 mg total) by mouth 2 (two) times daily. 1 tablet 0  . lidocaine (XYLOCAINE) 5 % ointment Apply 1 application topically as needed.    [name] MINOXIDIL, TOPICAL, 5 % SOLN Apply 1 application topically daily. 120 mL PRN  . Multiple Vitamins-Minerals (MULTIVITAMINS THER. W/MINERALS) TABS Take 1 tablet by mouth daily.    [name] spironolactone (ALDACTONE)  50 MG tablet Take 50 mg by mouth daily.    . traZODone (DESYREL) 50 MG tablet Take 50 mg by mouth at bedtime.    . VENTOLIN HFA 108 (90 BASE) MCG/ACT inhaler prn    . amLODipine-olmesartan (AZOR) 10-40 MG tablet Take 1 tablet by mouth daily.    . cloNIDine (CATAPRES - DOSED IN  MG/24 HR) 0.1 mg/24hr patch 0.1 mg once a week.    . clopidogrel (PLAVIX) 75 MG tablet Take 75 mg by mouth daily.    . hydrALAZINE (APRESOLINE) 100 MG tablet Take 1 tablet by mouth 3 times a day 90 tablet 5  . losartan (COZAAR) 100 MG tablet Take 1 tablet (100 mg total) by mouth daily. 90 tablet 0  . cloNIDine (CATAPRES) 0.2 MG tablet TAKE TWO TABLETS BY MOUTH 3 TIMES A DAY 90 tablet 0   No facility-administered medications prior to visit.    Allergies  Allergen Reactions  . Codeine Nausea And Vomiting    ROS Review of Systems    Objective:    Physical Exam Constitutional:      Appearance: She is well-developed.  HENT:     Head: Normocephalic and atraumatic.  Cardiovascular:     Rate and Rhythm: Normal rate and regular rhythm.     Heart sounds: Normal heart sounds.  Pulmonary:     Effort: Pulmonary effort is normal.     Breath sounds: Normal breath sounds.  Skin:    General: Skin is warm and dry.  Neurological:     Mental Status: She is alert and oriented to person, place, and time.  Psychiatric:        Behavior: Behavior normal.     BP 114/64   Pulse 70   Ht 5\' 6"  (1.676 m)   Wt 206 lb (93.4 kg)   SpO2 99%   BMI 33.25 kg/m  Wt Readings from Last 3 Encounters:  01/23/21 206 lb (93.4 kg)  12/21/20 204 lb (92.5 kg)  12/07/20 204 lb (92.5 kg)     There are no preventive care reminders to display for this patient.  There are no preventive care reminders to display for this patient.  Lab Results  Component Value Date   TSH 0.75 04/14/2017   Lab Results  Component Value Date   WBC 8.9 02/11/2018   HGB 14.0 02/11/2018   HCT 41.5 02/11/2018   MCV 82.2 02/11/2018   PLT 254  02/11/2018   Lab Results  Component Value Date   NA 142 12/03/2019   K 3.9 12/03/2019   CO2 26 12/03/2019   GLUCOSE 113 (H) 12/03/2019   BUN 11 12/03/2019   CREATININE 0.93 12/03/2019   BILITOT 0.7 12/03/2019   ALKPHOS 99 04/14/2017   AST 14 12/03/2019   ALT 9 12/03/2019   PROT 6.0 (L) 12/03/2019   ALBUMIN 4.1 04/14/2017   CALCIUM 9.4 12/03/2019   GFR 103.47 08/08/2011   Lab Results  Component Value Date   CHOL 180 12/03/2019   Lab Results  Component Value Date   HDL 49 (L) 12/03/2019   Lab Results  Component Value Date   LDLCALC 110 (H) 12/03/2019   Lab Results  Component Value Date   TRIG 103 12/03/2019   Lab Results  Component Value Date   CHOLHDL 3.7 12/03/2019   Lab Results  Component Value Date   HGBA1C 5.6 04/14/2017      Assessment & Plan:   Problem List Items Addressed This Visit      Cardiovascular and Mediastinum   Essential hypertension, malignant - Primary    Blood pressure looks great today.  Continue current regimen.      Relevant Medications   furosemide (LASIX) 40 MG tablet   hydrALAZINE (APRESOLINE) 100 MG tablet   cloNIDine (CATAPRES - DOSED IN MG/24 HR) 0.1 mg/24hr patch   amLODipine-olmesartan (AZOR) 10-40 MG tablet  Congestive heart failure (HCC)    No sign of volume overload on exam today.      Relevant Medications   furosemide (LASIX) 40 MG tablet   hydrALAZINE (APRESOLINE) 100 MG tablet   cloNIDine (CATAPRES - DOSED IN MG/24 HR) 0.1 mg/24hr patch   amLODipine-olmesartan (AZOR) 10-40 MG tablet     Other   History of stroke    Continues to follow with neurology.  She is doing okay on the blood thinner and aspirin but has had some easy bruising.  She is also currently wearing a heart monitor that they have recommended she is headed on for couple weeks thus far.  Follow-up scheduled for June.        Right upper back soreness-no distinct rash etc.  Did encourage her to keep an eye on the area and if any point she does  notice a rash then please let us know.  Meds ordered this encounter  Medications  . hydrALAZINE (APRESOLINE) 100 MG tablet    Sig: Take 1 tablet (100 mg total) by mouth 3 (three) times daily.    Dispense:  360 tablet    Refill:  1  . clopidogrel (PLAVIX) 75 MG tablet    Sig: Take 1 tablet (75 mg total) by mouth daily.    Dispense:  90 tablet    Refill:  0  . cloNIDine (CATAPRES - DOSED IN MG/24 HR) 0.1 mg/24hr patch    Sig: Place 1 patch (0.1 mg total) onto the skin once a week.    Dispense:  12 patch    Refill:  1  . amLODipine-olmesartan (AZOR) 10-40 MG tablet    Sig: Take 1 tablet by mouth daily.    Dispense:  90 tablet    Refill:  1    Follow-up: Return in about 3 months (around 04/24/2021).    Beatrice Lecher, MD

## 2021-01-24 ENCOUNTER — Encounter: Payer: Self-pay | Admitting: Family Medicine

## 2021-01-24 NOTE — Assessment & Plan Note (Signed)
No sign of volume overload on exam today. °

## 2021-01-24 NOTE — Assessment & Plan Note (Signed)
Blood pressure looks great today.  Continue current regimen. 

## 2021-01-24 NOTE — Assessment & Plan Note (Addendum)
Continues to follow with neurology.  She is doing okay on the blood thinner and aspirin but has had some easy bruising.  She is also currently wearing a heart monitor that they have recommended she is headed on for couple weeks thus far.  Follow-up scheduled for June.

## 2021-02-01 ENCOUNTER — Other Ambulatory Visit: Payer: Self-pay | Admitting: Cardiology

## 2021-02-01 ENCOUNTER — Other Ambulatory Visit: Payer: Self-pay | Admitting: Family Medicine

## 2021-02-02 ENCOUNTER — Other Ambulatory Visit: Payer: Self-pay | Admitting: *Deleted

## 2021-02-02 NOTE — Progress Notes (Signed)
error 

## 2021-02-03 ENCOUNTER — Other Ambulatory Visit: Payer: Self-pay | Admitting: Family Medicine

## 2021-02-09 ENCOUNTER — Other Ambulatory Visit: Payer: Self-pay | Admitting: *Deleted

## 2021-02-09 DIAGNOSIS — L84 Corns and callosities: Secondary | ICD-10-CM | POA: Diagnosis not present

## 2021-02-09 DIAGNOSIS — D2372 Other benign neoplasm of skin of left lower limb, including hip: Secondary | ICD-10-CM | POA: Diagnosis not present

## 2021-02-09 DIAGNOSIS — D239 Other benign neoplasm of skin, unspecified: Secondary | ICD-10-CM | POA: Diagnosis not present

## 2021-02-09 DIAGNOSIS — M65872 Other synovitis and tenosynovitis, left ankle and foot: Secondary | ICD-10-CM | POA: Diagnosis not present

## 2021-02-09 MED ORDER — CLONIDINE 0.1 MG/24HR TD PTWK: 0.1000 mg | MEDICATED_PATCH | TRANSDERMAL | 1 refills | Status: DC

## 2021-02-16 ENCOUNTER — Other Ambulatory Visit: Payer: Self-pay | Admitting: *Deleted

## 2021-02-16 MED ORDER — BUSPIRONE HCL 10 MG PO TABS: 10.0000 mg | ORAL_TABLET | Freq: Three times a day (TID) | ORAL | 5 refills | Status: DC

## 2021-02-21 ENCOUNTER — Telehealth: Payer: Self-pay | Admitting: Family Medicine

## 2021-02-21 NOTE — Progress Notes (Signed)
  Chronic Care Management   Outreach Note  02/21/2021 Name: [name] MRN: [id number] DOB: 1956-08-22  Referred by: Hali Marry, MD Reason for referral : No chief complaint on file.   An unsuccessful telephone outreach was attempted today. The patient was referred to the pharmacist for assistance with care management and care coordination.   Follow Up Plan:   [name] Upstream Scheduler

## 2021-03-03 ENCOUNTER — Other Ambulatory Visit: Payer: Self-pay | Admitting: Cardiology

## 2021-03-03 ENCOUNTER — Other Ambulatory Visit: Payer: Self-pay | Admitting: Family Medicine

## 2021-03-03 DIAGNOSIS — I10 Essential (primary) hypertension: Secondary | ICD-10-CM

## 2021-03-07 ENCOUNTER — Telehealth: Payer: Self-pay | Admitting: Family Medicine

## 2021-03-07 NOTE — Chronic Care Management (AMB) (Signed)
  Chronic Care Management   Outreach Note  03/07/2021 Name: [name] MRN: [id number] DOB: June 07, 1956  Referred by: Hali Marry, MD Reason for referral : No chief complaint on file.   A second unsuccessful telephone outreach was attempted today. The patient was referred to pharmacist for assistance with care management and care coordination.  Follow Up Plan:   [name] Upstream Scheduler

## 2021-03-07 NOTE — Progress Notes (Signed)
  Chronic Care Management   Outreach Note  03/07/2021 Name: CASSANDRE OLEKSY MRN: [id number] DOB: 1956/03/27  Referred by: Hali Marry, MD Reason for referral : No chief complaint on file.   A second unsuccessful telephone outreach was attempted today. The patient was referred to pharmacist for assistance with care management and care coordination.  Follow Up Plan:   [name] Upstream Scheduler

## 2021-03-09 ENCOUNTER — Telehealth: Payer: Self-pay | Admitting: Family Medicine

## 2021-03-09 NOTE — Chronic Care Management (AMB) (Signed)
  Chronic Care Management   Note  03/09/2021 Name: [name] MRN: [id number] DOB: 1956/06/29  [name] is a 65 y.o. year old female who is a primary care patient of [name], Rene Kocher, MD. I reached out to [name] by phone today in response to a referral sent by [name]'s PCP, Hali Marry, MD.   [name] was given information about Chronic Care Management services today including:  1. CCM service includes personalized support from designated clinical staff supervised by her physician, including individualized plan of care and coordination with other care providers 2. 24/7 contact phone numbers for assistance for urgent and routine care needs. 3. Service will only be billed when office clinical staff spend 20 minutes or more in a month to coordinate care. 4. Only one practitioner may furnish and bill the service in a calendar month. 5. The patient may stop CCM services at any time (effective at the end of the month) by phone call to the office staff.   Patient agreed to services and verbal consent obtained.   Follow up plan:   [name] Upstream Scheduler

## 2021-03-19 DIAGNOSIS — Z8673 Personal history of transient ischemic attack (TIA), and cerebral infarction without residual deficits: Secondary | ICD-10-CM | POA: Diagnosis not present

## 2021-03-19 DIAGNOSIS — I509 Heart failure, unspecified: Secondary | ICD-10-CM | POA: Diagnosis not present

## 2021-03-19 DIAGNOSIS — R0600 Dyspnea, unspecified: Secondary | ICD-10-CM | POA: Diagnosis not present

## 2021-03-20 ENCOUNTER — Telehealth: Payer: Self-pay | Admitting: *Deleted

## 2021-03-20 NOTE — Telephone Encounter (Signed)
Calling to f/u on an PA for Diclofenac Sodium 1% Gel. Advised that I didn't see where there was a request for a PA   He stated that there was a phone number to call to start the PA: [phone].

## 2021-03-20 NOTE — Progress Notes (Signed)
HPI: FU hypertension. Previous renal Dopplers were technically difficult. There was no stenosis on the left and the right was not well visualized. Follow up CTA to rule out renal artery stenosis was done and was negative. Last Myoview in November 2012 showed an ejection fraction of 71%, breast attenuation but no ischemia. Had CVA 2/22. CTA 2/22 showed no carotid stenosis. Monitor at [facility] 5/22 showed sinus with 10 and 11 beats NSVT. Echo 2/22 at [facility] showed normal LV function, neg saline microcavitation study, mild MR. Since last seen she denies chest pain or syncope.  Mild dyspnea on exertion.  Some dizziness at times since her CVA.  No pedal edema.  Current Outpatient Medications  Medication Sig Dispense Refill   AMBULATORY NON FORMULARY MEDICATION Walker. Use as needed. Disp 1.  Unable gait R26.81 1 each 0   amLODipine-olmesartan (AZOR) 10-40 MG tablet Take 1 tablet by mouth daily. 90 tablet 1   ASPIRIN LOW DOSE 81 MG EC tablet Take 1 tablet by mouth every day 30 tablet 0   atorvastatin (LIPITOR) 80 MG tablet Take 1 tablet (80 mg total) by mouth daily. Please advise pt to make appt with provider for further refills - 2nd attempt 15 tablet 0   busPIRone (BUSPAR) 10 MG tablet Take 1 tablet (10 mg total) by mouth 3 (three) times daily. 90 tablet 5   cloNIDine (CATAPRES - DOSED IN MG/24 HR) 0.1 mg/24hr patch Place 1 patch (0.1 mg total) onto the skin once a week. 12 patch 1   clopidogrel (PLAVIX) 75 MG tablet Take 1 tablet (75 mg total) by mouth daily. 90 tablet 0   diclofenac Sodium (VOLTAREN) 1 % GEL Apply topically.     DULoxetine (CYMBALTA) 60 MG capsule Take 1 capsule by mouth every evening 30 capsule 11   esomeprazole (NEXIUM) 40 MG capsule Take 40 mg by mouth 2 (two) times daily as needed. 1 capsule daily scheduled, will take an additional capsule if needed     finasteride (PROPECIA) 1 MG tablet Take 1 tablet (1 mg total) by mouth daily. 1 tablet 0   furosemide (LASIX) 40 MG tablet  Take 40 mg by mouth 2 (two) times daily.     gabapentin (NEURONTIN) 800 MG tablet Take 1 tablet by mouth 3 times a day 90 tablet 11   hydrALAZINE (APRESOLINE) 100 MG tablet Take 1 tablet (100 mg total) by mouth 3 (three) times daily. 360 tablet 1   labetalol (NORMODYNE) 300 MG tablet Take 0.5 tablets (150 mg total) by mouth 2 (two) times daily. 1 tablet 0   lidocaine (XYLOCAINE) 5 % ointment Apply 1 application topically as needed.     MINOXIDIL, TOPICAL, 5 % SOLN Apply 1 application topically daily. 120 mL PRN   Multiple Vitamins-Minerals (MULTIVITAMINS THER. W/MINERALS) TABS Take 1 tablet by mouth daily.     spironolactone (ALDACTONE) 50 MG tablet Take 1 tablet by mouth every day 30 tablet 0   traZODone (DESYREL) 50 MG tablet Take 50 mg by mouth at bedtime.     VENTOLIN HFA 108 (90 BASE) MCG/ACT inhaler prn     No current facility-administered medications for this visit.     Past Medical History:  Diagnosis Date   Allergy    Anemia    Anxiety    Bipolar disorder (HCC)    CHF (congestive heart failure) (HCC)    Chronic diastolic heart failure (HCC)    hospitalized at [facility]   Complication of anesthesia  had "breathing problems and ended up in ICU" 03/2010 report on chart   COPD (chronic obstructive pulmonary disease) (HCC)    DDD (degenerative disc disease), lumbar    Depression    Diabetes in pregnancy    diet cnontrolled   GERD (gastroesophageal reflux disease)    Glucose intolerance (impaired glucose tolerance)    HLD (hyperlipidemia)    nec/nos   HTN (hypertension)    benign essential   MVP (mitral valve prolapse)    Narcolepsy    Stroke ([place])    Suicide attempt ([place])    hx   Syncope    admx to [facility] in 2/12: Echo with normal LVF; head CT unremarkable, ECG stable, no further w/u   Ulcer     Past Surgical History:  Procedure Laterality Date   AUGMENTATION MAMMAPLASTY  2003   BLADDER SUSPENSION  2002   DENTAL SURGERY  2016   Teeth implants x 4    GASTRIC BYPASS  2014   at [place] MICRODISCECTOMY  09/11/2011   Procedure: LUMBAR LAMINECTOMY/DECOMPRESSION MICRODISCECTOMY;  Surgeon: Tobi Bastos;  Location: [facility];  Service: Orthopedics;  Laterality: Right;  Hemi Laminectomy/Microdiscectomy L5 - S1 on the Right (X-Ray)   PARTIAL HYSTERECTOMY  1979   REPAIR PERONEAL TENDONS ANKLE Left 2008   after injury     Social History   Socioeconomic History   Marital status: Married    Spouse name: Not on file   Number of children: Not on file   Years of education: Not on file   Highest education level: Not on file  Occupational History   Not on file  Tobacco Use   Smoking status: Former    Pack years: 0.00    Types: Cigarettes    Quit date: 10/08/2003    Years since quitting: 17.4   Smokeless tobacco: Never  Vaping Use   Vaping Use: Never used  Substance and Sexual Activity   Alcohol use: Yes    Comment: rare   Drug use: No   Sexual activity: Not on file  Other Topics Concern   Not on file  Social History Narrative   Regularly exercises.    Social Determinants of Health   Financial Resource Strain: Not on file  Food Insecurity: Not on file  Transportation Needs: Not on file  Physical Activity: Not on file  Stress: Not on file  Social Connections: Not on file  Intimate Partner Violence: Not on file    Family History  Problem Relation Age of Onset   Heart attack Father 5   Hypertension Father    Diabetes Mother    Hypertension Mother    Stroke Other        grandmother   Colon cancer Neg Hx    Esophageal cancer Neg Hx    Stomach cancer Neg Hx    Rectal cancer Neg Hx     ROS: no fevers or chills, productive cough, hemoptysis, dysphasia, odynophagia, melena, hematochezia, dysuria, hematuria, rash, seizure activity, orthopnea, PND, pedal edema, claudication. Remaining systems are negative.  Physical Exam: Well-developed well-nourished in  no acute distress.  Skin is warm and dry.  HEENT is normal.  Neck is supple.  Chest is clear to auscultation with normal expansion.  Cardiovascular exam is regular rate and rhythm.  Abdominal exam nontender or distended. No masses palpated. Extremities show no edema. neuro grossly intact  ECG-normal sinus rhythm, normal axis, lateral T  wave inversion.  Personally reviewed  A/P  1 hypertension-blood pressure controlled.  Continue present medications.  Check potassium and renal function.  2 recent CVA-no atrial fibrillation noted on FU monitor.  Continue aspirin and Plavix.  3 CAD-based on prior CT showing coronary calcification.  Continue aspirin and statin.  4 hyperlipidemia-continue statin.  Check lipids and liver.  5 NSVT-no symptoms and LV function normal.  Continue beta-blocker.  6 palpitations-continue beta-blocker.  Kirk Ruths, MD

## 2021-03-21 ENCOUNTER — Other Ambulatory Visit: Payer: Self-pay

## 2021-03-21 ENCOUNTER — Encounter: Payer: Self-pay | Admitting: Cardiology

## 2021-03-21 ENCOUNTER — Ambulatory Visit (INDEPENDENT_AMBULATORY_CARE_PROVIDER_SITE_OTHER): Payer: Medicare Other | Admitting: Cardiology

## 2021-03-21 VITALS — BP 127/76 | HR 88 | Ht 66.0 in | Wt 206.4 lb

## 2021-03-21 DIAGNOSIS — E7800 Pure hypercholesterolemia, unspecified: Secondary | ICD-10-CM

## 2021-03-21 DIAGNOSIS — I2510 Atherosclerotic heart disease of native coronary artery without angina pectoris: Secondary | ICD-10-CM | POA: Diagnosis not present

## 2021-03-21 DIAGNOSIS — I10 Essential (primary) hypertension: Secondary | ICD-10-CM | POA: Diagnosis not present

## 2021-03-21 NOTE — Patient Instructions (Signed)
  Lab Work:  Your physician recommends that you return for lab work FASTING  If you have labs (blood work) drawn today and your tests are completely normal, you will receive your results only by: [place] (if you have [place]) OR A paper copy in the mail If you have any lab test that is abnormal or we need to change your treatment, we will call you to review the results.   Follow-Up: At [facility], you and your health needs are our priority.  As part of our continuing mission to provide you with exceptional heart care, we have created designated Provider Care Teams.  These Care Teams include your primary Cardiologist (physician) and Advanced Practice Providers (APPs -  Physician Assistants and Nurse Practitioners) who all work together to provide you with the care you need, when you need it.  We recommend signing up for the patient portal called "MyChart".  Sign up information is provided on this After Visit Summary.  MyChart is used to connect with patients for Virtual Visits (Telemedicine).  Patients are able to view lab/test results, encounter notes, upcoming appointments, etc.  Non-urgent messages can be sent to your provider as well.   To learn more about what you can do with MyChart, go to [email or web address].    Your next appointment:   6 month(s)  The format for your next appointment:   In Person  Provider:   Kirk Ruths, MD

## 2021-03-28 DIAGNOSIS — E7800 Pure hypercholesterolemia, unspecified: Secondary | ICD-10-CM | POA: Diagnosis not present

## 2021-03-28 DIAGNOSIS — I2510 Atherosclerotic heart disease of native coronary artery without angina pectoris: Secondary | ICD-10-CM | POA: Diagnosis not present

## 2021-03-28 LAB — COMPREHENSIVE METABOLIC PANEL
AG Ratio: 1.8 (calc) (ref 1.0–2.5)
ALT: 14 U/L (ref 6–29)
AST: 17 U/L (ref 10–35)
Albumin: 4.3 g/dL (ref 3.6–5.1)
Alkaline phosphatase (APISO): 100 U/L (ref 37–153)
BUN: 12 mg/dL (ref 7–25)
CO2: 25 mmol/L (ref 20–32)
Calcium: 9.5 mg/dL (ref 8.6–10.4)
Chloride: 108 mmol/L (ref 98–110)
Creat: 0.82 mg/dL (ref 0.50–0.99)
Globulin: 2.4 g/dL (calc) (ref 1.9–3.7)
Glucose, Bld: 110 mg/dL — ABNORMAL HIGH (ref 65–99)
Potassium: 3.9 mmol/L (ref 3.5–5.3)
Sodium: 140 mmol/L (ref 135–146)
Total Bilirubin: 0.7 mg/dL (ref 0.2–1.2)
Total Protein: 6.7 g/dL (ref 6.1–8.1)

## 2021-03-28 LAB — LIPID PANEL
Cholesterol: 132 mg/dL (ref <200)
HDL: 52 mg/dL (ref >=50)
LDL Cholesterol (Calc): 67 mg/dL (calc)
Non-HDL Cholesterol (Calc): 80 mg/dL (calc) (ref <130)
Total CHOL/HDL Ratio: 2.5 (calc) (ref <5.0)
Triglycerides: 54 mg/dL (ref <150)

## 2021-03-29 ENCOUNTER — Other Ambulatory Visit: Payer: Self-pay | Admitting: Cardiology

## 2021-04-05 DIAGNOSIS — I69318 Other symptoms and signs involving cognitive functions following cerebral infarction: Secondary | ICD-10-CM | POA: Diagnosis not present

## 2021-04-11 ENCOUNTER — Other Ambulatory Visit: Payer: Self-pay | Admitting: Family Medicine

## 2021-04-15 DIAGNOSIS — Z20822 Contact with and (suspected) exposure to covid-19: Secondary | ICD-10-CM | POA: Diagnosis not present

## 2021-04-16 ENCOUNTER — Other Ambulatory Visit: Payer: Self-pay | Admitting: *Deleted

## 2021-04-16 MED ORDER — HYDRALAZINE HCL 100 MG PO TABS: 100.0000 mg | ORAL_TABLET | Freq: Three times a day (TID) | ORAL | 1 refills | Status: DC

## 2021-04-18 ENCOUNTER — Telehealth: Payer: Self-pay | Admitting: Pharmacist

## 2021-04-18 NOTE — Chronic Care Management (AMB) (Signed)
Chronic Care Management Pharmacy Assistant   Name: [name]  MRN: [id number] DOB: [birth year]  [name] is an 65 y.o. year old adult who presents for her initial CCM visit with the clinical pharmacist.  Reason for Encounter: Initial CCM Visit    Recent office visits:  01/23/21 Beatrice Lecher MD - Seen for follow up hypertension.   12/07/20 Beatrice Lecher, MD - Seen for hospital follow up. Xrays done. Return in 6 weeks.   Recent consult visits:  03/21/21 Kirk Ruths MD Cardiology - Seen for follow up hypertension.Labs ordered. Follow up in 6 months.  03/19/21 Cammy Brochure Neurology - Seen for history of stoke. Follow up in 4 months. Return in 3 months.  01/10/21 [name], MD Sports Medicine - Seen for left ankle pain. No notes available.  01/04/21 Rudell Cobb PT - Seen for muscle weakness.  12/28/20 Rudell Cobb PT - Seen for muscle weakness.  12/26/20 Rudell Cobb PT - Seen for muscle weakness.  12/22/20 Rudell Cobb PT - Seen for muscle weakness.  12/21/20 [name], MD  Sports Medicine - Seen for left ankle pain. Xrays done. Referral placed to physical therapy. Return in 2 weeks.   12/18/20 Rudell Cobb PT - Seen for muscle weakness.  12/14/20 Cammy Brochure Neurology - Seen for history of stroke. Follow up in 3 months.  03/03/2 [name], MD Sports medicine - Seen for bilateral chronic knee pain. Xrays done. Prescription for walker given. Return in 2 weeks.   12/04/20 Rudell Cobb PT - Seen for muscle weakness. Hospital visits:  Medication Reconciliation was completed by comparing discharge summary, patient's EMR and Pharmacy list, and upon discussion with patient.  Admitted to the hospital on 11/21/20 due to Hypertension. Discharge date was 11/25/20. Discharged from [facility]?Medications Started at [facility] Discharge:?? -started plavix 75 mg due to stroke  Medication Changes at Hospital  Discharge: -Changed none  Medications Discontinued at Hospital Discharge: -Stopped clonidine 0.2 mg   Medications that remain the same after Hospital Discharge:??  -All other medications will remain the same.    Medications: Outpatient Encounter Medications as of 04/18/2021  Medication Sig   AMBULATORY NON FORMULARY MEDICATION Walker. Use as needed. Disp 1.  Unable gait R26.81   amLODipine-olmesartan (AZOR) 10-40 MG tablet Take 1 tablet by mouth daily.   ASPIRIN LOW DOSE 81 MG EC tablet Take 1 tablet by mouth every day   atorvastatin (LIPITOR) 80 MG tablet Take 1 tablet by mouth daily. Please advise pt to make appt with provider for further refills - 2nd attempt   busPIRone (BUSPAR) 10 MG tablet Take 1 tablet (10 mg total) by mouth 3 (three) times daily.   cloNIDine (CATAPRES - DOSED IN MG/24 HR) 0.1 mg/24hr patch Place 1 patch (0.1 mg total) onto the skin once a week.   clopidogrel (PLAVIX) 75 MG tablet Take 1 tablet by mouth once daily   diclofenac Sodium (VOLTAREN) 1 % GEL Apply topically.   DULoxetine (CYMBALTA) 60 MG capsule Take 1 capsule by mouth every evening   esomeprazole (NEXIUM) 40 MG capsule Take 40 mg by mouth 2 (two) times daily as needed. 1 capsule daily scheduled, will take an additional capsule if needed   finasteride (PROPECIA) 1 MG tablet Take 1 tablet (1 mg total) by mouth daily.   furosemide (LASIX) 40 MG tablet Take 40 mg by mouth 2 (two) times daily.   gabapentin (NEURONTIN) 800 MG tablet Take 1 tablet by mouth  3 times a day   hydrALAZINE (APRESOLINE) 100 MG tablet Take 1 tablet (100 mg total) by mouth 3 (three) times daily.   labetalol (NORMODYNE) 300 MG tablet Take 0.5 tablets (150 mg total) by mouth 2 (two) times daily.   lidocaine (XYLOCAINE) 5 % ointment Apply 1 application topically as needed.   MINOXIDIL, TOPICAL, 5 % SOLN Apply 1 application topically daily.   Multiple Vitamins-Minerals (MULTIVITAMINS THER. W/MINERALS) TABS Take 1 tablet by mouth daily.    spironolactone (ALDACTONE) 50 MG tablet Take 1 tablet by mouth every day   traZODone (DESYREL) 50 MG tablet Take 50 mg by mouth at bedtime.   VENTOLIN HFA 108 (90 BASE) MCG/ACT inhaler prn   No facility-administered encounter medications on file as of 04/18/2021.   Medications: amLODipine-olmesartan (AZOR) 10-40 MG tablet last filled 04/11/21 28 DS ASPIRIN LOW DOSE 81 MG EC tablet last filled 04/11/21 28 DS atorvastatin (LIPITOR) 80 MG tablet last filled 04/16/21 28 DS busPIRone (BUSPAR) 10 MG tablet last filled 04/13/21 28 DS cloNIDine (CATAPRES - DOSED IN MG/24 HR) 0.1 mg/24hr patch last filled 04/11/21 28 DS clopidogrel (PLAVIX) 75 MG tablet last filled 02/08/21 90 DS diclofenac Sodium (VOLTAREN) 1 % GEL last filled 04/11/21 32 DS DULoxetine (CYMBALTA) 60 MG capsule last filled 04/11/21 28 DS esomeprazole (NEXIUM) 40 MG capsule finasteride (PROPECIA) 1 MG tablet last filled  09/17/19 30 DS furosemide (LASIX) 40 MG tablet last filled 04/11/21 28 DS gabapentin (NEURONTIN) 800 MG tablet last filled 04/11/21 28 DS hydrALAZINE (APRESOLINE) 100 MG tablet last filled 04/11/21 90 DS labetalol (NORMODYNE) 300 MG tablet last filled 04/11/21 28 DS lidocaine (XYLOCAINE) 5 % ointment Multiple Vitamins-Minerals (MULTIVITAMINS THER. W/MINERALS) TABS spironolactone (ALDACTONE) 50 MG tablet last filled 04/11/21 28 DS traZODone (DESYREL) 50 MG tablet last filled 12/01/20 30 DS VENTOLIN HFA 108 (90 BASE) MCG/ACT inhaler   Star Rating Drugs: amLODipine-olmesartan (AZOR) 10-40 MG tablet last filled 04/11/21 28 DS atorvastatin (LIPITOR) 80 MG tablet last filled 04/16/21 28 DS  [place] Pharmacist Assistant [phone]

## 2021-04-19 ENCOUNTER — Other Ambulatory Visit: Payer: Self-pay | Admitting: Family Medicine

## 2021-04-24 ENCOUNTER — Other Ambulatory Visit: Payer: Self-pay | Admitting: Family Medicine

## 2021-04-24 ENCOUNTER — Other Ambulatory Visit: Payer: Self-pay

## 2021-04-24 ENCOUNTER — Ambulatory Visit (INDEPENDENT_AMBULATORY_CARE_PROVIDER_SITE_OTHER): Payer: Medicare Other | Admitting: Pharmacist

## 2021-04-24 DIAGNOSIS — R531 Weakness: Secondary | ICD-10-CM | POA: Diagnosis not present

## 2021-04-24 DIAGNOSIS — E7800 Pure hypercholesterolemia, unspecified: Secondary | ICD-10-CM | POA: Diagnosis not present

## 2021-04-24 DIAGNOSIS — I10 Essential (primary) hypertension: Secondary | ICD-10-CM

## 2021-04-24 DIAGNOSIS — R2689 Other abnormalities of gait and mobility: Secondary | ICD-10-CM | POA: Diagnosis not present

## 2021-04-24 DIAGNOSIS — I69398 Other sequelae of cerebral infarction: Secondary | ICD-10-CM | POA: Diagnosis not present

## 2021-04-24 MED ORDER — FINASTERIDE 1 MG PO TABS: 1.0000 mg | ORAL_TABLET | Freq: Every day | ORAL | 3 refills | Status: DC

## 2021-04-24 NOTE — Patient Instructions (Signed)
Visit Information   PATIENT GOALS:   Goals Addressed   None     Consent to CCM Services: [name] was given information about Chronic Care Management services today including:  CCM service includes personalized support from designated clinical staff supervised by her physician, including individualized plan of care and coordination with other care providers 24/7 contact phone numbers for assistance for urgent and routine care needs. Service will only be billed when office clinical staff spend 20 minutes or more in a month to coordinate care. Only one practitioner may furnish and bill the service in a calendar month. The patient may stop CCM services at any time (effective at the end of the month) by phone call to the office staff. The patient will be responsible for cost sharing (co-pay) of up to 20% of the service fee (after annual deductible is met).  Patient agreed to services and verbal consent obtained.   Patient verbalizes understanding of instructions provided today and agrees to view in [place].   Telephone follow up appointment with care management team member scheduled for: 3 months [place]: There are no care plans to display for this patient.

## 2021-04-24 NOTE — Progress Notes (Signed)
Chronic Care Management Pharmacy Note  04/24/2021 Name:  [name] MRN:  [id number] DOB:  1956/07/15  Summary: addressed HTN, HLD  Recommendations/Changes made from today's visit: Patient requesting refills for trazodone and finasteride. Advised pt to take BP at home and bring to future appointments, suspect may need to increase labetalol or clonidine if remains in SBP 150s. HR appears to tolerate (90-100s).  Plan: f/u with pharmacist in 3 months  Subjective: [name] is an 65 y.o. year old adult who is a primary patient of [name], Rene Kocher, MD.  The CCM team was consulted for assistance with disease management and care coordination needs.    Engaged with patient by telephone for initial visit in response to provider referral for pharmacy case management and/or care coordination services.   Consent to Services:  The patient was given information about Chronic Care Management services, agreed to services, and gave verbal consent prior to initiation of services.  Please see initial visit note for detailed documentation.   Patient Care Team: Hali Marry, MD as PCP - General [name], [facility] as Pharmacist (Pharmacist)  Recent office visits:  01/23/21 Beatrice Lecher MD - Seen for follow up hypertension.   12/07/20 Beatrice Lecher, MD - Seen for hospital follow up. Xrays done. Return in 6 weeks.    Recent consult visits:  03/21/21 Kirk Ruths MD Cardiology - Seen for follow up hypertension.Labs ordered. Follow up in 6 months.   03/19/21 Cammy Brochure Neurology - Seen for history of stoke. Follow up in 4 months. Return in 3 months.   01/10/21 [name], MD Sports Medicine - Seen for left ankle pain. No notes available.   12/14/20 Cammy Brochure Neurology - Seen for history of stroke. Follow up in 3 months.   03/03/2 [name], MD Sports medicine - Seen for bilateral chronic knee pain. Xrays done. Prescription for walker given. Return in 2  weeks.  Hospital visits:  Admitted to the hospital on 11/21/20 due to Hypertension. Discharge date was 11/25/20. Discharged from [facility].     Medication Changes at [facility] Discharge:?? -started plavix 75 mg due to stroke, stopped clonidine 0.52m, no other changes  Objective:  Lab Results  Component Value Date   CREATININE 0.82 03/28/2021   CREATININE 0.93 12/03/2019   CREATININE 0.94 10/29/2018    Lab Results  Component Value Date   HGBA1C 5.6 04/14/2017   Last diabetic Eye exam: No results found for: HMDIABEYEEXA  Last diabetic Foot exam: No results found for: HMDIABFOOTEX      Component Value Date/Time   CHOL 132 03/28/2021 0841   TRIG 54 03/28/2021 0841   HDL 52 03/28/2021 0841   CHOLHDL 2.5 03/28/2021 0841   VLDL 19 04/14/2017 0959   LDLCALC 67 03/28/2021 0841    Hepatic Function Latest Ref Rng & Units 03/28/2021 12/03/2019 02/11/2018  Total Protein 6.1 - 8.1 g/dL 6.7 6.0(L) 7.3  Albumin 3.6 - 5.1 g/dL - - -  AST 10 - 35 U/L '17 14 17  ' ALT 6 - 29 U/L '14 9 11  ' Alk Phosphatase 33 - 130 U/L - - -  Total Bilirubin 0.2 - 1.2 mg/dL 0.7 0.7 0.6    Lab Results  Component Value Date/Time   TSH 0.75 04/14/2017 09:59 AM   TSH 0.676 12/21/2014 10:59 AM    CBC Latest Ref Rng & Units 02/11/2018 04/14/2017 12/21/2014  WBC 3.8 - 10.8 Thousand/uL 8.9 7.1 8.3  Hemoglobin 11.7 - 15.5 g/dL  14.0 12.4 14.6  Hematocrit 35.0 - 45.0 % 41.5 39.2 43.6  Platelets 140 - 400 Thousand/uL 254 228 218     Social History   Tobacco Use  Smoking Status Former   Types: Cigarettes   Quit date: 10/08/2003   Years since quitting: 17.5  Smokeless Tobacco Never   BP Readings from Last 3 Encounters:  03/21/21 127/76  01/23/21 114/64  01/04/21 128/81   Pulse Readings from Last 3 Encounters:  03/21/21 88  01/23/21 70  01/04/21 86   Wt Readings from Last 3 Encounters:  03/21/21 206 lb 6.4 oz (93.6 kg)  01/23/21 206 lb (93.4 kg)  12/21/20 204 lb (92.5 kg)    Assessment:  Review of patient past medical history, allergies, medications, health status, including review of consultants reports, laboratory and other test data, was performed as part of comprehensive evaluation and provision of chronic care management services.   SDOH:  (Social Determinants of Health) assessments and interventions performed:    CCM Care Plan  Allergies  Allergen Reactions   Codeine Nausea And Vomiting and Nausea Only    Medications Reviewed Today     Reviewed by Lelon Perla, MD (Physician) on 03/21/21 at 32  Med List Status: <None>   Medication Order Taking? Sig Documenting Provider Last Dose Status Informant  AMBULATORY NON FORMULARY MEDICATION [id number] Yes Walker. Use as needed. Disp 1.  Unable gait R26.81 [name], MD Taking Active   amLODipine-olmesartan (AZOR) 10-40 MG tablet [id number] Yes Take 1 tablet by mouth daily. Hali Marry, MD Taking Active   ASPIRIN LOW DOSE 81 MG EC tablet [id number] Yes Take 1 tablet by mouth every day Lelon Perla, MD Taking Active   atorvastatin (LIPITOR) 80 MG tablet [id number] Yes Take 1 tablet (80 mg total) by mouth daily. Please advise pt to make appt with provider for further refills - 2nd attempt Lelon Perla, MD Taking Active   busPIRone (BUSPAR) 10 MG tablet [id number] Yes Take 1 tablet (10 mg total) by mouth 3 (three) times daily. Hali Marry, MD Taking Active   cloNIDine (CATAPRES - DOSED IN MG/24 HR) 0.1 mg/24hr patch [id number] Yes Place 1 patch (0.1 mg total) onto the skin once a week. Lelon Perla, MD Taking Active   clopidogrel (PLAVIX) 75 MG tablet [id number] Yes Take 1 tablet (75 mg total) by mouth daily. Hali Marry, MD Taking Active   diclofenac Sodium (VOLTAREN) 1 % GEL [id number] Yes Apply topically. [provider] Taking Active   DULoxetine (CYMBALTA) 60 MG capsule [id number] Yes Take 1 capsule by mouth every evening Hali Marry, MD Taking Active    esomeprazole (NEXIUM) 40 MG capsule [id number] Yes Take 40 mg by mouth 2 (two) times daily as needed. 1 capsule daily scheduled, will take an additional capsule if needed [provider] Taking Active   finasteride (PROPECIA) 1 MG tablet [id number] Yes Take 1 tablet (1 mg total) by mouth daily. Hali Marry, MD Taking Active   furosemide (LASIX) 40 MG tablet [id number] Yes Take 40 mg by mouth 2 (two) times daily. [provider] Taking Active   gabapentin (NEURONTIN) 800 MG tablet [id number] Yes Take 1 tablet by mouth 3 times a day Hali Marry, MD Taking Active   hydrALAZINE (APRESOLINE) 100 MG tablet [id number] Yes Take 1 tablet (100 mg total) by mouth 3 (three) times daily. Hali Marry, MD Taking Active   labetalol (NORMODYNE) 300 MG tablet [id number] Yes Take 0.5 tablets (  150 mg total) by mouth 2 (two) times daily. Hali Marry, MD Taking Active   lidocaine (XYLOCAINE) 5 % ointment [id number] Yes Apply 1 application topically as needed. [provider] Taking Active   MINOXIDIL, TOPICAL, 5 % SOLN [id number] Yes Apply 1 application topically daily. Hali Marry, MD Taking Active   Multiple Vitamins-Minerals (MULTIVITAMINS THER. W/MINERALS) Sheral Flow [id number] Yes Take 1 tablet by mouth daily. [provider] Taking Active   spironolactone (ALDACTONE) 50 MG tablet [id number] Yes Take 1 tablet by mouth every day Lelon Perla, MD Taking Active   traZODone (DESYREL) 50 MG tablet [id number] Yes Take 50 mg by mouth at bedtime. 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   Caregiver burden 02/10/2018   Mitral valve prolapse syndrome 08/15/2016   Congestive heart failure ([place]) 08/15/2016   Right cervical radiculopathy 08/09/2016   Essential hypertension, malignant 06/12/2016   History of stroke 03/13/2016   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   Narcolepsy 03/13/2013   Coronary atherosclerosis 03/13/2013   DDD (degenerative disc disease), lumbar    HIRSUTISM 06/02/2009   BIPOLAR DISORDER UNSPECIFIED 05/17/2009   Pain in limb 08/03/2008   Hyperlipidemia 07/15/2007   FATIGUE 07/15/2007    Immunization History  Administered Date(s) Administered   Influenza Split 06/27/2011   Influenza Whole 07/15/2007   Influenza,inj,Quad PF,6+ Mos 07/31/2016   Janssen (J&J) SARS-COV-2 Vaccination 02/04/2020   PFIZER(Purple Top)SARS-COV-2 Vaccination 11/07/2020   Tdap 12/21/2014    Conditions to be addressed/monitored: HTN and HLD  There are no care plans that you recently modified to display for this patient.   Medication Assistance: None required.  Patient affirms current coverage meets needs.  Patient's preferred pharmacy is:  [facility], [place] [address] Phone: [phone] Fax: [place] [address] [place] [address] Phone: [phone] Fax: [phone]  Uses pill box? No - keeps in original bottles & states works well for her. Keeps patch exchange days written on the calendar Pt endorses 100% compliance  Follow Up:  Patient agrees to Care Plan and Follow-up.  Plan: Telephone follow up appointment with care management team member scheduled for:  3 months  [name]

## 2021-04-26 ENCOUNTER — Other Ambulatory Visit: Payer: Self-pay | Admitting: Cardiology

## 2021-04-26 DIAGNOSIS — I10 Essential (primary) hypertension: Secondary | ICD-10-CM

## 2021-04-30 DIAGNOSIS — I69398 Other sequelae of cerebral infarction: Secondary | ICD-10-CM | POA: Diagnosis not present

## 2021-04-30 DIAGNOSIS — R2689 Other abnormalities of gait and mobility: Secondary | ICD-10-CM | POA: Diagnosis not present

## 2021-04-30 DIAGNOSIS — R531 Weakness: Secondary | ICD-10-CM | POA: Diagnosis not present

## 2021-05-07 DIAGNOSIS — R531 Weakness: Secondary | ICD-10-CM | POA: Diagnosis not present

## 2021-05-07 DIAGNOSIS — I69398 Other sequelae of cerebral infarction: Secondary | ICD-10-CM | POA: Diagnosis not present

## 2021-05-07 DIAGNOSIS — R2689 Other abnormalities of gait and mobility: Secondary | ICD-10-CM | POA: Diagnosis not present

## 2021-05-07 DIAGNOSIS — R42 Dizziness and giddiness: Secondary | ICD-10-CM | POA: Diagnosis not present

## 2021-05-14 DIAGNOSIS — I69398 Other sequelae of cerebral infarction: Secondary | ICD-10-CM | POA: Diagnosis not present

## 2021-05-14 DIAGNOSIS — R531 Weakness: Secondary | ICD-10-CM | POA: Diagnosis not present

## 2021-05-14 DIAGNOSIS — R42 Dizziness and giddiness: Secondary | ICD-10-CM | POA: Diagnosis not present

## 2021-05-14 DIAGNOSIS — R2689 Other abnormalities of gait and mobility: Secondary | ICD-10-CM | POA: Diagnosis not present

## 2021-06-06 ENCOUNTER — Telehealth: Payer: Self-pay | Admitting: Family Medicine

## 2021-06-06 NOTE — Telephone Encounter (Signed)
Left message for patient to call back and schedule Medicare Annual Wellness Visit (AWV) either virtually or in office. I gave my number [phone]  awvs last awv 12/21/14  please schedule at anytime with health coach  This should be a 40 minute visit.

## 2021-06-13 ENCOUNTER — Other Ambulatory Visit: Payer: Self-pay | Admitting: *Deleted

## 2021-06-13 MED ORDER — AMITRIPTYLINE HCL 10 MG PO TABS: 20.0000 mg | ORAL_TABLET | Freq: Every evening | ORAL | 11 refills | Status: DC

## 2021-06-22 ENCOUNTER — Other Ambulatory Visit: Payer: Self-pay | Admitting: Cardiology

## 2021-06-30 ENCOUNTER — Other Ambulatory Visit: Payer: Self-pay | Admitting: Family Medicine

## 2021-07-07 ENCOUNTER — Other Ambulatory Visit: Payer: Self-pay | Admitting: Cardiology

## 2021-07-19 DIAGNOSIS — E1169 Type 2 diabetes mellitus with other specified complication: Secondary | ICD-10-CM | POA: Diagnosis not present

## 2021-07-19 DIAGNOSIS — Z8673 Personal history of transient ischemic attack (TIA), and cerebral infarction without residual deficits: Secondary | ICD-10-CM | POA: Diagnosis not present

## 2021-07-19 DIAGNOSIS — R030 Elevated blood-pressure reading, without diagnosis of hypertension: Secondary | ICD-10-CM | POA: Diagnosis not present

## 2021-07-24 ENCOUNTER — Other Ambulatory Visit: Payer: Self-pay

## 2021-07-24 ENCOUNTER — Ambulatory Visit (INDEPENDENT_AMBULATORY_CARE_PROVIDER_SITE_OTHER): Payer: Medicare Other | Admitting: Pharmacist

## 2021-07-24 DIAGNOSIS — I10 Essential (primary) hypertension: Secondary | ICD-10-CM

## 2021-07-24 DIAGNOSIS — E782 Mixed hyperlipidemia: Secondary | ICD-10-CM

## 2021-07-24 NOTE — Patient Instructions (Signed)
Visit Information  PATIENT GOALS:  Goals Addressed             This Visit's Progress    Medication Management       Patient Goals/Self-Care Activities Over the next 30 days, patient will:  take medications as prescribed and check blood pressure 2x per week, document, and provide at future appointments  Follow Up Plan: Telephone follow up appointment with care management team member scheduled for:  1 month        Patient verbalizes understanding of instructions provided today and agrees to view in [place].   Telephone follow up appointment with care management team member scheduled for: 1 month

## 2021-07-24 NOTE — Progress Notes (Signed)
Chronic Care Management Pharmacy Note  07/24/2021 Name:  [name] MRN:  [id number] DOB:  05-Aug-1956  Summary: addressed HTN, HLD. BP remains elevated. Patient recently saw neurology who says continue DAPT until cardiology weighs in, but antiplatelet monotherapy is okay with them as long as it is okay with cardiology.  Recommendations/Changes made from today's visit: Patient requesting refills for trazodone, encouraged pt to establish visit with PCP for discussion as this is a "historical med" and patient states was prescribed during inpatient rehab, not from PCP. Advised pt to take BP at home and bring to future appointments, suspect may need to increase labetalol or clonidine if remains in SBP 150s- 170s. Recommended patient see PCP as they were overdue to return for HTN follow-up anyways.  Plan: f/u with pharmacist in 1 month   Subjective: [name] is an 65 y.o. year old adult who is a primary patient of [name], Rene Kocher, MD.  The CCM team was consulted for assistance with disease management and care coordination needs.    Engaged with patient by telephone for follow up visit in response to provider referral for pharmacy case management and/or care coordination services.   Consent to Services:  The patient was given information about Chronic Care Management services, agreed to services, and gave verbal consent prior to initiation of services.  Please see initial visit note for detailed documentation.   Patient Care Team: Hali Marry, MD as PCP - General [name], [facility] as Pharmacist (Pharmacist)  Recent office visits:  01/23/21 Beatrice Lecher MD - Seen for follow up hypertension.   12/07/20 Beatrice Lecher, MD - Seen for hospital follow up. Xrays done. Return in 6 weeks.    Recent consult visits:  03/21/21 Kirk Ruths MD Cardiology - Seen for follow up hypertension.Labs ordered. Follow up in 6 months.   03/19/21 Cammy Brochure Neurology -  Seen for history of stoke. Follow up in 4 months. Return in 3 months.   01/10/21 [name], MD Sports Medicine - Seen for left ankle pain. No notes available.   12/14/20 Cammy Brochure Neurology - Seen for history of stroke. Follow up in 3 months.   03/03/2 [name], MD Sports medicine - Seen for bilateral chronic knee pain. Xrays done. Prescription for walker given. Return in 2 weeks.  Hospital visits:  Admitted to the hospital on 11/21/20 due to Hypertension. Discharge date was 11/25/20. Discharged from [facility].     Medication Changes at [facility] Discharge:?? -started plavix 75 mg due to stroke, stopped clonidine 0.62m, no other changes  Objective:  Lab Results  Component Value Date   CREATININE 0.82 03/28/2021   CREATININE 0.93 12/03/2019   CREATININE 0.94 10/29/2018    Lab Results  Component Value Date   HGBA1C 5.6 04/14/2017       Component Value Date/Time   CHOL 132 03/28/2021 0841   TRIG 54 03/28/2021 0841   HDL 52 03/28/2021 0841   CHOLHDL 2.5 03/28/2021 0841   VLDL 19 04/14/2017 0959   LDLCALC 67 03/28/2021 0841    Hepatic Function Latest Ref Rng & Units 03/28/2021 12/03/2019 02/11/2018  Total Protein 6.1 - 8.1 g/dL 6.7 6.0(L) 7.3  Albumin 3.6 - 5.1 g/dL - - -  AST 10 - 35 U/L '17 14 17  ' ALT 6 - 29 U/L '14 9 11  ' Alk Phosphatase 33 - 130 U/L - - -  Total Bilirubin 0.2 - 1.2 mg/dL 0.7 0.7 0.6    Lab  Results  Component Value Date/Time   TSH 0.75 04/14/2017 09:59 AM   TSH 0.676 12/21/2014 10:59 AM    CBC Latest Ref Rng & Units 02/11/2018 04/14/2017 12/21/2014  WBC 3.8 - 10.8 Thousand/uL 8.9 7.1 8.3  Hemoglobin 11.7 - 15.5 g/dL 14.0 12.4 14.6  Hematocrit 35.0 - 45.0 % 41.5 39.2 43.6  Platelets 140 - 400 Thousand/uL 254 228 218     Social History   Tobacco Use  Smoking Status Former   Types: Cigarettes   Quit date: 10/08/2003   Years since quitting: 17.8  Smokeless Tobacco Never   BP Readings from Last 3 Encounters:  03/21/21 127/76   01/23/21 114/64  01/04/21 128/81   Pulse Readings from Last 3 Encounters:  03/21/21 88  01/23/21 70  01/04/21 86   Wt Readings from Last 3 Encounters:  03/21/21 206 lb 6.4 oz (93.6 kg)  01/23/21 206 lb (93.4 kg)  12/21/20 204 lb (92.5 kg)    Assessment: Review of patient past medical history, allergies, medications, health status, including review of consultants reports, laboratory and other test data, was performed as part of comprehensive evaluation and provision of chronic care management services.   SDOH:  (Social Determinants of Health) assessments and interventions performed:    CCM Care Plan  Allergies  Allergen Reactions   Codeine Nausea And Vomiting and Nausea Only    Medications Reviewed Today     Reviewed by [name], [facility] (Pharmacist) on 07/24/21 at 1416  Med List Status: <None>   Medication Order Taking? Sig Documenting Provider Last Dose Status Informant  AMBULATORY NON FORMULARY MEDICATION [id number] Yes Walker. Use as needed. Disp 1.  Unable gait R26.81 [name], MD Taking Active   amitriptyline (ELAVIL) 10 MG tablet [id number] No Take 2 tablets (20 mg total) by mouth every evening.  Patient not taking: Reported on 07/24/2021   Hali Marry, MD Not Taking Active   amLODipine-olmesartan (AZOR) 10-40 MG tablet [id number] Yes Take 1 tablet by mouth daily. Hali Marry, MD Taking Active   ASPIRIN LOW DOSE 81 MG EC tablet [id number] Yes Take 1 tablet by mouth every day. NEED MD APPT Lelon Perla, MD Taking Active   atorvastatin (LIPITOR) 80 MG tablet [id number] Yes Take 1 tablet by mouth daily. Please advise pt to make appt with provider for further refills - 2nd attempt Lelon Perla, MD Taking Active   busPIRone (BUSPAR) 10 MG tablet [id number] Yes Take 1 tablet by mouth 3 times a day Hali Marry, MD Taking Active   cloNIDine (CATAPRES - DOSED IN MG/24 HR) 0.1 mg/24hr patch [id number] Yes Place 1 patch (0.1 mg total) onto  the skin once a week. Lelon Perla, MD Taking Active            Med Note Darra Lis Apr 24, 2021  3:41 PM) Tuesdays   clopidogrel (PLAVIX) 75 MG tablet [id number] Yes Take 1 tablet by mouth once daily Hali Marry, MD Taking Active   diclofenac Sodium (VOLTAREN) 1 % GEL [id number] Yes Apply topically. [provider] Taking Active   DULoxetine (CYMBALTA) 60 MG capsule [id number] Yes Take 1 capsule by mouth every evening Hali Marry, MD Taking Active   esomeprazole (NEXIUM) 40 MG capsule [id number] Yes Take 40 mg by mouth 2 (two) times daily as needed. 1 capsule daily scheduled, will take an additional capsule if needed [provider] Taking Active   finasteride (PROPECIA) 1 MG tablet [id number]  Yes Take 1 tablet (1 mg total) by mouth daily. Hali Marry, MD Taking Active   furosemide (LASIX) 40 MG tablet [id number] No Take 1 tablet by mouth twice daily  Patient not taking: Reported on 07/24/2021   Lelon Perla, MD Not Taking Active   gabapentin (NEURONTIN) 800 MG tablet [id number] Yes Take 1 tablet by mouth 3 times a day Hali Marry, MD Taking Active   hydrALAZINE (APRESOLINE) 100 MG tablet [id number] Yes Take 1 tablet (100 mg total) by mouth 3 (three) times daily. Hali Marry, MD Taking Active   labetalol (NORMODYNE) 300 MG tablet [id number] Yes Take 2 tablets by mouth twice daily Lelon Perla, MD Taking Active   lidocaine (XYLOCAINE) 5 % ointment [id number] Yes Apply 1 application topically as needed. [provider] Taking Active   MINOXIDIL, TOPICAL, 5 % SOLN [id number] Yes Apply 1 application topically daily. Hali Marry, MD Taking Active   Multiple Vitamins-Minerals (MULTIVITAMINS THER. W/MINERALS) Sheral Flow [id number] Yes Take 1 tablet by mouth daily. [provider] Taking Active   spironolactone (ALDACTONE) 50 MG tablet [id number] Yes Take 1 tablet by mouth every day. NEED MD APPT Lelon Perla, MD Taking Active   traZODone (DESYREL) 50 MG tablet [id number] Yes Take 50 mg by mouth at bedtime. 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   Caregiver burden 02/10/2018   Mitral valve prolapse syndrome 08/15/2016   Congestive heart failure ([employer]) 08/15/2016   Right cervical radiculopathy 08/09/2016   Essential hypertension, malignant 06/12/2016   History of stroke 03/13/2016   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   Narcolepsy 03/13/2013   Coronary atherosclerosis 03/13/2013   DDD (degenerative disc disease), lumbar    HIRSUTISM 06/02/2009   BIPOLAR DISORDER UNSPECIFIED 05/17/2009   Pain in limb 08/03/2008   Hyperlipidemia 07/15/2007   FATIGUE 07/15/2007    Immunization History  Administered Date(s) Administered   Influenza Split 06/27/2011   Influenza Whole 07/15/2007   Influenza,inj,Quad PF,6+ Mos 07/31/2016   Janssen (J&J) SARS-COV-2 Vaccination 02/04/2020   PFIZER(Purple Top)SARS-COV-2 Vaccination 11/07/2020   Tdap 12/21/2014    Conditions to be addressed/monitored: HTN and HLD  Care Plan : Medication Management  Updates made by [name], [place] since 07/24/2021 12:00 AM     Problem: HTN, HLD      Long-Range Goal: Disease Progression Prevention   Start Date: 04/24/2021  Recent Progress: On track  Priority: High  Note:   Current Barriers:  None at present  Pharmacist Clinical Goal(s):  Over the next 30 days, patient will adhere to plan to optimize  therapeutic regimen for hypertension as evidenced by report of adherence to recommended medication management changes through collaboration with PharmD and provider.   Interventions: 1:1 collaboration with Hali Marry, MD regarding development and update of comprehensive plan of care as evidenced by provider attestation and co-signature Inter-disciplinary care team collaboration (see longitudinal plan of care) Comprehensive medication review performed; medication list updated in  electronic medical record  Hypertension:  Uncontrolled; current treatment:amlodipine-olmesartan 10-27m, clonidine 0.181mpatch, hydralazine 10066mID, Labetalol 150m68mD, Lasix 40mg5m (pt states not taking), spironolactone 50mg 73my;   Current home readings: HR 90-100s. 177/103 at neurologist visit, 154/108 when checked at home.  Other home checks: 160/98, 154/90.   Denies hypotensive/hypertensive symptoms  Recommended continue current regimen, take BP at home, and bring numbers to future appointments and see PCP for HTN follow-up Hyperlipidemia:  Controlled; current treatment:atorvastatin 80mg; 75mcommended continue current regimen  Patient Goals/Self-Care Activities Over the next 30 days, patient will:  take medications as prescribed and check blood pressure 2x per week, document, and provide at future appointments  Follow Up Plan: Telephone follow up appointment with care management team member scheduled for:  1 month      Medication Assistance: None required.  Patient affirms current coverage meets needs.  [facility] [id number] [phone] [address] [phone] box? No - keeps in original bottles & states works well for her. Keeps patch  exchange days written on the calendar Pt endorses 100% compliance  Follow Up:  Patient agrees to Care Plan and Follow-up.  Plan: Telephone follow up appointment with care management team member scheduled for:  1 month  Jeanetta Alonzo Larinda ButteryD Clinical Pharmacist [phone]

## 2021-08-05 ENCOUNTER — Other Ambulatory Visit: Payer: Self-pay | Admitting: Cardiology

## 2021-08-06 DIAGNOSIS — I10 Essential (primary) hypertension: Secondary | ICD-10-CM

## 2021-08-06 DIAGNOSIS — E782 Mixed hyperlipidemia: Secondary | ICD-10-CM | POA: Diagnosis not present

## 2021-08-13 ENCOUNTER — Other Ambulatory Visit: Payer: Self-pay

## 2021-08-13 MED ORDER — CLONIDINE 0.1 MG/24HR TD PTWK: 0.1000 mg | MEDICATED_PATCH | TRANSDERMAL | 1 refills | Status: DC

## 2021-08-14 ENCOUNTER — Other Ambulatory Visit: Payer: Self-pay | Admitting: Osteopathic Medicine

## 2021-08-20 ENCOUNTER — Ambulatory Visit (INDEPENDENT_AMBULATORY_CARE_PROVIDER_SITE_OTHER): Payer: Medicare Other | Admitting: Family Medicine

## 2021-08-20 DIAGNOSIS — Z0000 Encounter for general adult medical examination without abnormal findings: Secondary | ICD-10-CM

## 2021-08-20 DIAGNOSIS — Z1231 Encounter for screening mammogram for malignant neoplasm of breast: Secondary | ICD-10-CM

## 2021-08-20 NOTE — Progress Notes (Signed)
MEDICARE ANNUAL WELLNESS VISIT  08/20/2021  Telephone Visit Disclaimer This Medicare AWV was conducted by telephone due to national recommendations for restrictions regarding the COVID-19 Pandemic (e.g. social distancing).  I verified, using two identifiers, that I am speaking with [name] or their authorized healthcare agent. I discussed the limitations, risks, security, and privacy concerns of performing an evaluation and management service by telephone and the potential availability of an in-person appointment in the future. The patient expressed understanding and agreed to proceed.  Location of Patient: Home Location of Provider (nurse):  In the office.  Subjective:    [name] is a 65 y.o. adult patient of [name], Rene Kocher, MD who had a Medicare Annual Wellness Visit today via telephone. [name] is Legally disabled and lives with their family. she has 2 children. she reports that she is socially active and does interact with friends/family regularly. she is minimally physically active and enjoys watching television.  Patient Care Team: Hali Marry, MD as PCP - [place], [employer], [facility] as Pharmacist (Pharmacist)  Advanced Directives 08/20/2021 02/10/2018 08/20/2016 12/21/2014 09/11/2011 09/06/2011  Does Patient Have a Medical Advance Directive? Yes Yes Yes No Patient does not have advance directive Patient does not have advance directive  Type of Advance Directive [place];Living will [place];Living will [place];Living will - - -  Does patient want to make changes to medical advance directive? No - Patient declined - No - Patient declined - - -  Copy of [place] in Chart? No - copy requested No - copy requested - - - -  Would patient like information on creating a medical advance directive? - - - Yes - Educational materials given - -  Pre-existing out of facility DNR order  (yellow form or pink MOST form) - - - - No -    Hospital Utilization Over the Past 12 Months: # of hospitalizations or ER visits: 1 # of surgeries: 1  Review of Systems    Patient reports that her overall health is worse compared to last year.  History obtained from chart review and the patient  Patient Reported Readings (BP, Pulse, CBG, Weight, etc) none  Pain Assessment Pain : No/denies pain     Current Medications & Allergies (verified) Allergies as of 08/20/2021       Reactions   Codeine Nausea And Vomiting, Nausea Only        Medication List        Accurate as of August 20, 2021  2:28 PM. If you have any questions, ask your nurse or doctor.          AMBULATORY NON FORMULARY MEDICATION Walker. Use as needed. Disp 1.  Unable gait R26.81   amitriptyline 10 MG tablet Commonly known as: ELAVIL Take 2 tablets (20 mg total) by mouth every evening.   amLODipine-atorvastatin 10-40 MG tablet Commonly known as: CADUET Take 1 tablet by mouth daily.   amLODipine-olmesartan 10-40 MG tablet Commonly known as: AZOR Take 1 tablet by mouth every day. NEED APPT/REFILL   Aspirin Low Dose 81 MG EC tablet Generic drug: aspirin Take 1 tablet by mouth every day. NEED MD APPT   atorvastatin 80 MG tablet Commonly known as: LIPITOR Take 1 tablet by mouth daily. Please advise pt to make appt with provider for further refills - 2nd attempt   busPIRone 10 MG tablet Commonly known as: BUSPAR Take 1 tablet by mouth 3  times a day   cloNIDine 0.1 mg/24hr patch Commonly known as: CATAPRES - Dosed in mg/24 hr Place 1 patch (0.1 mg total) onto the skin once a week.   clopidogrel 75 MG tablet Commonly known as: PLAVIX Take 1 tablet by mouth once daily   diclofenac Sodium 1 % Gel Commonly known as: VOLTAREN Apply topically.   DULoxetine 60 MG capsule Commonly known as: CYMBALTA Take 1 capsule by mouth every evening   esomeprazole 40 MG capsule Commonly known as:  NEXIUM Take 40 mg by mouth 2 (two) times daily as needed. 1 capsule daily scheduled, will take an additional capsule if needed   finasteride 1 MG tablet Commonly known as: PROPECIA Take 1 tablet (1 mg total) by mouth daily.   furosemide 40 MG tablet Commonly known as: LASIX Take 1 tablet by mouth twice daily   gabapentin 800 MG tablet Commonly known as: NEURONTIN Take 1 tablet by mouth 3 times a day   hydrALAZINE 100 MG tablet Commonly known as: APRESOLINE Take 1 tablet (100 mg total) by mouth 3 (three) times daily.   labetalol 300 MG tablet Commonly known as: NORMODYNE Take 2 tablets by mouth twice daily   lidocaine 5 % ointment Commonly known as: XYLOCAINE Apply 1 application topically as needed.   losartan 100 MG tablet Commonly known as: COZAAR Take 1 tablet by mouth once daily   MINOXIDIL (TOPICAL) 5 % Soln Apply 1 application topically daily.   multivitamins ther. w/minerals Tabs tablet Take 1 tablet by mouth daily.   spironolactone 50 MG tablet Commonly known as: ALDACTONE Take 1 tablet by mouth every day. NEED MD APPT   traZODone 50 MG tablet Commonly known as: DESYREL Take 50 mg by mouth at bedtime.   Ventolin HFA 108 (90 Base) MCG/ACT inhaler Generic drug: albuterol prn        History (reviewed): Past Medical History:  Diagnosis Date   Allergy    Anemia    Anxiety    Bipolar disorder (HCC)    CHF (congestive heart failure) (HCC)    Chronic diastolic heart failure (HCC)    hospitalized at [facility]   Complication of anesthesia    had "breathing problems and ended up in ICU" 03/2010 report on chart   COPD (chronic obstructive pulmonary disease) ([place])    DDD (degenerative disc disease), lumbar    Depression    Diabetes in pregnancy    diet cnontrolled   GERD (gastroesophageal reflux disease)    Glucose intolerance (impaired glucose tolerance)    HLD (hyperlipidemia)    nec/nos   HTN (hypertension)    benign essential   MVP (mitral  valve prolapse)    Narcolepsy    Stroke ([place])    Suicide attempt ([place])    hx   Syncope    admx to [facility] in 2/12: Echo with normal LVF; head CT unremarkable, ECG stable, no further w/u   Ulcer    Past Surgical History:  Procedure Laterality Date   AUGMENTATION MAMMAPLASTY  2003   BLADDER SUSPENSION  2002   DENTAL SURGERY  2016   Teeth implants x 4   GASTRIC BYPASS  2014   at [facility] MICRODISCECTOMY  09/11/2011   Procedure: LUMBAR LAMINECTOMY/DECOMPRESSION MICRODISCECTOMY;  Surgeon: Tobi Bastos;  Location: [facility] OR[facility];  Service: Orthopedics;  Laterality: Right;  Hemi Laminectomy/Microdiscectomy L5 - S1 on the Right (X-Ray)   PARTIAL HYSTERECTOMY  1979  REPAIR PERONEAL TENDONS ANKLE Left 2008   after injury    Family History  Problem Relation Age of Onset   Heart attack Father 62   Hypertension Father    Diabetes Mother    Hypertension Mother    Stroke Other        grandmother   Colon cancer Neg Hx    Esophageal cancer Neg Hx    Stomach cancer Neg Hx    Rectal cancer Neg Hx    Social History   Socioeconomic History   Marital status: Married    Spouse name: Myisha Pickerel   Number of children: 2   Years of education: 13   Highest education level: Some college, no degree  Occupational History   Occupation: legally disabled  Tobacco Use   Smoking status: Former    Types: Cigarettes    Quit date: 10/08/2003    Years since quitting: 17.8   Smokeless tobacco: Never  Vaping Use   Vaping Use: Never used  Substance and Sexual Activity   Alcohol use: Yes    Comment: rare   Drug use: No   Sexual activity: Not on file  Other Topics Concern   Not on file  Social History Narrative   Lives with her spouse and her son. She has two children. She enjoys watching television. She is not able to do a lot of since her stroke in January, 2022.   Social Determinants of Health   Financial Resource  Strain: Low Risk    Difficulty of Paying Living Expenses: Not hard at all  Food Insecurity: No Food Insecurity   Worried About Charity fundraiser in the Last Year: Never true   [place] in the Last Year: Never true  Transportation Needs: No Transportation Needs   Lack of Transportation (Medical): No   Lack of Transportation (Non-Medical): No  Physical Activity: Inactive   Days of Exercise per Week: 0 days   Minutes of Exercise per Session: 0 min  Stress: No Stress Concern Present   Feeling of Stress : Not at all  Social Connections: Moderately Isolated   Frequency of Communication with Friends and Family: More than three times a week   Frequency of Social Gatherings with Friends and Family: Once a week   Attends Religious Services: Never   Marine scientist or Organizations: No   Attends Archivist Meetings: Never   Marital Status: Married    Activities of Daily Living In your present state of health, do you have any difficulty performing the following activities: 08/20/2021  Hearing? N  Vision? Y  Comment Since the stroke, she has having problem vision problems. She has not seen an opthamalogist.  Difficulty concentrating or making decisions? N  Walking or climbing stairs? Y  Comment since the stroke, she has been having difficulty and uses a walker.  Dressing or bathing? Y  Comment uses a shower chair.  Doing errands, shopping? Y  Comment she is not able to drive since her stroke.  Preparing Food and eating ? Y  Comment some difficulty with preparing food since her stroke.  Using the Toilet? N  In the past six months, have you accidently leaked urine? N  Do you have problems with loss of bowel control? N  Managing your Medications? N  Comment uses a pill box  Managing your Finances? N  Housekeeping or managing your Housekeeping? N  Some recent data might be hidden    Patient Education/ Literacy  How often do you need to have someone help you when  you read instructions, pamphlets, or other written materials from your doctor or pharmacy?: 1 - Never What is the last grade level you completed in school?: high school graduate  Exercise Current Exercise Habits: The patient does not participate in regular exercise at present, Exercise limited by: neurologic condition(s)  Diet Patient reports consuming 1 meals a day and 1 snack(s) a day Patient reports that her primary diet is: Regular Patient reports that she does have regular access to food.   Depression Screen PHQ 2/9 Scores 08/20/2021 02/09/2019 03/10/2018 01/06/2018 06/16/2017 12/21/2014  PHQ - 2 Score 0 2 2 2 2  0  PHQ- 9 Score 0 10 8 11 12  -     Fall Risk Fall Risk  08/20/2021 06/08/2019 03/10/2018 12/21/2014  Falls in the past year? 1 - No Yes  Number falls in past yr: 1 - - 1  Injury with Fall? 1 - - Yes  Risk for fall due to : Impaired balance/gait;Impaired mobility;History of fall(s) Impaired balance/gait;Impaired mobility - Other (Comment)  Follow up Education provided;Falls prevention discussed;Falls evaluation completed Education provided - Falls prevention discussed     Objective:  AREONNA BRAN seemed alert and oriented and she participated appropriately during our telephone visit.  Blood Pressure Weight BMI  BP Readings from Last 3 Encounters:  03/21/21 127/76  01/23/21 114/64  01/04/21 128/81   Wt Readings from Last 3 Encounters:  03/21/21 206 lb 6.4 oz (93.6 kg)  01/23/21 206 lb (93.4 kg)  12/21/20 204 lb (92.5 kg)   BMI Readings from Last 1 Encounters:  03/21/21 33.31 kg/m    *Unable to obtain current vital signs, weight, and BMI due to telephone visit type  Hearing/Vision  [name] did not seem to have difficulty with hearing/understanding during the telephone conversation Reports that she has not had a formal eye exam by an eye care professional within the past year Reports that she has not had a formal hearing evaluation within the past year *Unable to fully  assess hearing and vision during telephone visit type  Cognitive Function: 6CIT Screen 08/20/2021  What Year? 0 points  What month? 0 points  What time? 0 points  Count back from 20 2 points  Months in reverse 0 points  Repeat phrase 0 points  Total Score 2   (Normal:0-7, Significant for Dysfunction: >8)  Normal Cognitive Function Screening: Yes   Immunization & Health Maintenance Record Immunization History  Administered Date(s) Administered   Influenza Split 06/27/2011   Influenza Whole 07/15/2007   Influenza,inj,Quad PF,6+ Mos 07/31/2016   Janssen (J&J) SARS-COV-2 Vaccination 02/04/2020   PFIZER(Purple Top)SARS-COV-2 Vaccination 11/07/2020   Tdap 12/21/2014    Health Maintenance  Topic Date Due   COVID-19 Vaccine (3 - Booster for [employer] series) 09/05/2021 (Originally 01/02/2021)   Zoster Vaccines- Shingrix (1 of 2) 11/20/2021 (Originally 05/13/1976)   INFLUENZA VACCINE  01/04/2022 (Originally 05/07/2021)   Pneumococcal Vaccine 59-12 Years old (1 - PCV) 08/20/2022 (Originally 05/14/1962)   MAMMOGRAM  08/20/2022 (Originally 07/20/2021)   TETANUS/TDAP  12/20/2024   COLONOSCOPY (Pts 45-60yrs Insurance coverage will need to be confirmed)  07/25/2029   Hepatitis C Screening  Completed   HIV Screening  Completed   HPV VACCINES  Aged Out       Assessment  This is a routine wellness examination for [place].  Health Maintenance: Due or Overdue There are no preventive care reminders to display for this patient.  [name] does not need a referral for Community Assistance: Care Management:   no Social Work:    no Prescription Assistance:  no Nutrition/Diabetes Education:  no   Plan:  Personalized Goals  Goals Addressed               This Visit's Progress     Patient Stated (pt-stated)        08/20/2021 AWV Goal: Exercise for General Health  Patient will verbalize understanding of the benefits of increased physical activity: Exercising regularly is  important. It will improve your overall fitness, flexibility, and endurance. Regular exercise also will improve your overall health. It can help you control your weight, reduce stress, and improve your bone density. Over the next year, patient will increase physical activity as tolerated with a goal of at least 150 minutes of moderate physical activity per week.  You can tell that you are exercising at a moderate intensity if your heart starts beating faster and you start breathing faster but can still hold a conversation. Moderate-intensity exercise ideas include: Walking 1 mile (1.6 km) in about 15 minutes Biking Hiking Golfing Dancing Water aerobics Patient will verbalize understanding of everyday activities that increase physical activity by providing examples like the following: Yard work, such as: Sales promotion account executive Gardening Washing windows or floors Patient will be able to explain general safety guidelines for exercising:  Before you start a new exercise program, talk with your health care provider. Do not exercise so much that you hurt yourself, feel dizzy, or get very short of breath. Wear comfortable clothes and wear shoes with good support. Drink plenty of water while you exercise to prevent dehydration or heat stroke. Work out until your breathing and your heartbeat get faster.        Personalized Health Maintenance & Screening Recommendations  Pneumococcal vaccine  Influenza vaccine Screening mammography Shingrix - please get the records faxed over.  Lung Cancer Screening Recommended: no (Low Dose CT Chest recommended if Age 82-80 years, 30 pack-year currently smoking OR have quit w/in past 15 years) Hepatitis C Screening recommended: no HIV Screening recommended: no  Advanced Directives: Written information was not prepared per patient's request.  Referrals & Orders Orders Placed  This Encounter  Procedures   Mammogram 3D SCREEN BREAST BILATERAL     Follow-up Plan Follow-up with Hali Marry, MD as planned Schedule Tetanus shot at the pharmacy.  Flu and Pneumonia vaccine can be done at your next in-office visit or you can get those at the pharmacy as well. Medicare wellness visit in one year.  Patient will access AVS on my chart.   I have personally reviewed and noted the following in the patient's chart:   Medical and social history Use of alcohol, tobacco or illicit drugs  Current medications and supplements Functional ability and status Nutritional status Physical activity Advanced directives List of other physicians Hospitalizations, surgeries, and ER visits in previous 12 months Vitals Screenings to include cognitive, depression, and falls Referrals and appointments  In addition, I have reviewed and discussed with [name] certain preventive protocols, quality metrics, and best practice recommendations. A written personalized care plan for preventive services as well as general preventive health recommendations is available and can be mailed to the patient at her request.      Tinnie Gens, RN  08/20/2021

## 2021-08-20 NOTE — Patient Instructions (Addendum)
[place] Maintenance Summary and Written Plan of Care  Ms. Borenstein ,  Thank you for allowing me to perform your Medicare Annual Wellness Visit and for your ongoing commitment to your health.   Health Maintenance & Immunization History Health Maintenance  Topic Date Due   COVID-19 Vaccine (3 - Booster for Janssen series) 09/05/2021 (Originally 01/02/2021)   Zoster Vaccines- Shingrix (1 of 2) 11/20/2021 (Originally 05/13/1976)   INFLUENZA VACCINE  01/04/2022 (Originally 05/07/2021)   Pneumococcal Vaccine 53-84 Years old (1 - PCV) 08/20/2022 (Originally 05/14/1962)   MAMMOGRAM  08/20/2022 (Originally 07/20/2021)   TETANUS/TDAP  12/20/2024   COLONOSCOPY (Pts 45-26yrs Insurance coverage will need to be confirmed)  07/25/2029   Hepatitis C Screening  Completed   HIV Screening  Completed   HPV VACCINES  Aged Out   Immunization History  Administered Date(s) Administered   Influenza Split 06/27/2011   Influenza Whole 07/15/2007   Influenza,inj,Quad PF,6+ Mos 07/31/2016   Janssen (J&J) SARS-COV-2 Vaccination 02/04/2020   PFIZER(Purple Top)SARS-COV-2 Vaccination 11/07/2020   Tdap 12/21/2014    These are the patient goals that we discussed:  Goals Addressed               This Visit's Progress     Patient Stated (pt-stated)        08/20/2021 AWV Goal: Exercise for General Health  Patient will verbalize understanding of the benefits of increased physical activity: Exercising regularly is important. It will improve your overall fitness, flexibility, and endurance. Regular exercise also will improve your overall health. It can help you control your weight, reduce stress, and improve your bone density. Over the next year, patient will increase physical activity as tolerated with a goal of at least 150 minutes of moderate physical activity per week.  You can tell that you are exercising at a moderate intensity if your heart starts beating faster and you start  breathing faster but can still hold a conversation. Moderate-intensity exercise ideas include: Walking 1 mile (1.6 km) in about 15 minutes Biking Hiking Golfing Dancing Water aerobics Patient will verbalize understanding of everyday activities that increase physical activity by providing examples like the following: Yard work, such as: Sales promotion account executive Gardening Washing windows or floors Patient will be able to explain general safety guidelines for exercising:  Before you start a new exercise program, talk with your health care provider. Do not exercise so much that you hurt yourself, feel dizzy, or get very short of breath. Wear comfortable clothes and wear shoes with good support. Drink plenty of water while you exercise to prevent dehydration or heat stroke. Work out until your breathing and your heartbeat get faster.          This is a list of Health Maintenance Items that are overdue or due now: Pneumococcal vaccine  Influenza vaccine Screening mammography Shingrix - please get the records faxed over.  Orders/Referrals Placed Today: Orders Placed This Encounter  Procedures   Mammogram 3D SCREEN BREAST BILATERAL    Standing Status:   Future    Standing Expiration Date:   08/20/2022    Scheduling Instructions:     Please call patient to schedule.    Order Specific Question:   Reason for Exam (SYMPTOM  OR DIAGNOSIS REQUIRED)    Answer:   Breast cancer screening    Order Specific Question:   Preferred imaging location?    Answer:   Pharmacist, hospital  [facility]   (Contact our referral department at [phone] if you have not spoken with someone about your referral appointment within the next 5 days)    Follow-up Plan Follow-up with Hali Marry, MD as planned Schedule Tetanus shot at the pharmacy.  Flu and Pneumonia vaccine can be done at your next in-office visit or you can get  those at the pharmacy as well. Medicare wellness visit in one year.  Patient will access AVS on my chart.

## 2021-08-21 ENCOUNTER — Other Ambulatory Visit: Payer: Self-pay

## 2021-08-21 ENCOUNTER — Ambulatory Visit (INDEPENDENT_AMBULATORY_CARE_PROVIDER_SITE_OTHER): Payer: Medicare Other | Admitting: Pharmacist

## 2021-08-21 DIAGNOSIS — I10 Essential (primary) hypertension: Secondary | ICD-10-CM

## 2021-08-21 DIAGNOSIS — E782 Mixed hyperlipidemia: Secondary | ICD-10-CM

## 2021-08-21 NOTE — Progress Notes (Signed)
Chronic Care Management Pharmacy Note  08/23/2021 Name:  [name] MRN:  [id number] DOB:  04/17/1956  Summary: addressed HTN, HLD. During medication reconciliation, it was discovered patient is possibly taking duplicate of: - amlodipine (49m total daily dose) - ARBs (olmesartan + losartan) - atorvastatin (12263mtotal daily dose)   Recommendations/Changes made from today's visit: Recommend patient call back to front desk to schedule PCP visit, overdue for HTN follow up. Also recommended patient bring ALL bottles to next office visit for us[place]oreao determin what she is currently taking.  Plan: f/u with pharmacist in 1 month   Subjective: Theresa KLEISTs an [age].o. year old female who is a primary patient of [name], CaRene KocherMD.  The CCM team was consulted for assistance with disease management and care coordination needs.    Engaged with patient by telephone for follow up visit in response to provider referral for pharmacy case management and/or care coordination services.   Consent to Services:  The patient was given information about Chronic Care Management services, agreed to services, and gave verbal consent prior to initiation of services.  Please see initial visit note for detailed documentation.   Patient Care Team: MeHali MarryMD as PCP - General [facility] Pharmacist (Pharmacist)  Recent office visits:  01/23/21 CaBeatrice LecherD - Seen for follow up hypertension.   12/07/20 CaBeatrice LecherMD - Seen for hospital follow up. Xrays done. Return in 6 weeks.    Recent consult visits:  03/21/21 BrKirk RuthsD Cardiology - Seen for follow up hypertension.Labs ordered. Follow up in 6 months.   03/19/21 JoCammy Brochureeurology - Seen for history of stoke. Follow up in 4 months. Return in 3 months.   01/10/21 EvLynne LeaderMD Sports Medicine - Seen for left ankle pain. No notes available.   12/14/20 JoCammy Brochureeurology - Seen for history  of stroke. Follow up in 3 months.   03/03/2 EvLynne LeaderMD Sports medicine - Seen for bilateral chronic knee pain. Xrays done. Prescription for walker given. Return in 2 weeks.  Hospital visits:  Admitted to the hospital on 11/21/20 due to Hypertension. Discharge date was 11[facility]20[facility]    [facility] -started plavix 75 mg due to stroke, stopped clonidine 0.63m80mno other changes  Objective:  Lab Results  Component Value Date   CREATININE 0.82 03/28/2021   CREATININE 0.93 12/03/2019   CREATININE 0.94 10/29/2018    Lab Results  Component Value Date   HGBA1C 5.6 04/14/2017       Component Value Date/Time   CHOL 132 03/28/2021 0841   TRIG 54 03/28/2021 0841   HDL 52 03/28/2021 0841   CHOLHDL 2.5 03/28/2021 0841   VLDL 19 04/14/2017 0959   LDLCALC 67 03/28/2021 0841    Hepatic Function Latest Ref Rng & Units 03/28/2021 12/03/2019 02/11/2018  Total Protein 6.1 - 8.1 g/dL 6.7 6.0(L) 7.3  Albumin 3.6 - 5.1 g/dL - - -  AST 10 - 35 U/L '17 14 17  ' ALT 6 - 29 U/L '14 9 11  ' Alk Phosphatase 33 - 130 U/L - - -  Total Bilirubin 0.2 - 1.2 mg/dL 0.7 0.7 0.6    Lab Results  Component Value Date/Time   TSH 0.75 04/14/2017 09:59 AM   TSH 0.676 12/21/2014 10:59 AM    CBC Latest Ref Rng & Units 02/11/2018 04/14/2017 12/21/2014  WBC 3.8 - 10.8 Thousand/uL 8.9  7.1 8.3  Hemoglobin 11.7 - 15.5 g/dL 14.0 12.4 14.6  Hematocrit 35.0 - 45.0 % 41.5 39.2 43.6  Platelets 140 - 400 Thousand/uL 254 228 218     Social History   Tobacco Use  Smoking Status Former   Types: Cigarettes   Quit date: 10/08/2003   Years since quitting: 17.8  Smokeless Tobacco Never   BP Readings from Last 3 Encounters:  03/21/21 127/76  01/23/21 114/64  01/04/21 128/81   Pulse Readings from Last 3 Encounters:  03/21/21 88  01/23/21 70  01/04/21 86   Wt Readings from Last 3 Encounters:  03/21/21 206 lb 6.4 oz (93.6 kg)  01/23/21 206 lb (93.4 kg)   12/21/20 204 lb (92.5 kg)    Assessment: Review of patient past medical history, allergies, medications, health status, including review of consultants reports, laboratory and other test data, was performed as part of comprehensive evaluation and provision of chronic care management services.   SDOH:  (Social Determinants of Health) assessments and interventions performed:    CCM Care Plan  Allergies  Allergen Reactions   Codeine Nausea And Vomiting and Nausea Only    Medications Reviewed Today     Reviewed by [name], [facility] (Pharmacist) on 08/21/21 at 1445  Med List Status: <None>   Medication Order Taking? Sig Documenting Provider Last Dose Status Informant  AMBULATORY NON FORMULARY MEDICATION [id number] Yes Walker. Use as needed. Disp 1.  Unable gait R26.81 [name], MD Taking Active   amitriptyline (ELAVIL) 10 MG tablet [id number] Yes Take 2 tablets (20 mg total) by mouth every evening. Hali Marry, MD Taking Active   amLODipine-atorvastatin (CADUET) 10-40 MG tablet [id number] Yes Take 1 tablet by mouth daily. [provider] Taking Active   amLODipine-olmesartan (AZOR) 10-40 MG tablet [id number] Yes Take 1 tablet by mouth every day. NEED APPT/REFILL Lelon Perla, MD Taking Active   ASPIRIN LOW DOSE 81 MG EC tablet [id number] Yes Take 1 tablet by mouth every day. NEED MD APPT Lelon Perla, MD Taking Active   atorvastatin (LIPITOR) 80 MG tablet [id number] Yes Take 1 tablet by mouth daily. Please advise pt to make appt with provider for further refills - 2nd attempt Stanford Breed Denice Bors, MD Taking Active   busPIRone (BUSPAR) 10 MG tablet [id number] Yes Take 1 tablet by mouth 3 times a day Hali Marry, MD Taking Active   cloNIDine (CATAPRES - DOSED IN MG/24 HR) 0.1 mg/24hr patch [id number] Yes Place 1 patch (0.1 mg total) onto the skin once a week. Lelon Perla, MD Taking Active   clopidogrel (PLAVIX) 75 MG tablet [id number] Yes Take 1 tablet  by mouth once daily Hali Marry, MD Taking Active   diclofenac Sodium (VOLTAREN) 1 % GEL [id number] Yes Apply topically. [provider] Taking Active   DULoxetine (CYMBALTA) 60 MG capsule [id number] Yes Take 1 capsule by mouth every evening Hali Marry, MD Taking Active   esomeprazole (NEXIUM) 40 MG capsule [id number] Yes Take 40 mg by mouth 2 (two) times daily as needed. 1 capsule daily scheduled, will take an additional capsule if needed [provider] Taking Active   finasteride (PROPECIA) 1 MG tablet [id number] Yes Take 1 tablet (1 mg total) by mouth daily. Hali Marry, MD Taking Active   furosemide (LASIX) 40 MG tablet [id number] No Take 1 tablet by mouth twice daily  Patient not taking: No sig reported   Lelon Perla, MD Not Taking Active   gabapentin (  NEURONTIN) 800 MG tablet [id number] Yes Take 1 tablet by mouth 3 times a day Hali Marry, MD Taking Active   hydrALAZINE (APRESOLINE) 100 MG tablet [id number] Yes Take 1 tablet (100 mg total) by mouth 3 (three) times daily. Hali Marry, MD Taking Active   labetalol (NORMODYNE) 300 MG tablet [id number] Yes Take 2 tablets by mouth twice daily Lelon Perla, MD Taking Active   lidocaine (XYLOCAINE) 5 % ointment [id number] Yes Apply 1 application topically as needed. [provider] Taking Active   losartan (COZAAR) 100 MG tablet [id number] Yes Take 1 tablet by mouth once daily Hali Marry, MD Taking Active   MINOXIDIL, TOPICAL, 5 % SOLN [id number] Yes Apply 1 application topically daily. Hali Marry, MD Taking Active   Multiple Vitamins-Minerals (MULTIVITAMINS THER. W/MINERALS) Sheral Flow [id number] Yes Take 1 tablet by mouth daily. [provider] Taking Active   spironolactone (ALDACTONE) 50 MG tablet [id number] Yes Take 1 tablet by mouth every day. NEED MD APPT Lelon Perla, MD Taking Active   traZODone (DESYREL) 50 MG tablet [id number] Yes Take 50  mg by mouth at bedtime. 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   Caregiver burden 02/10/2018   Mitral valve prolapse syndrome 08/15/2016   Congestive heart failure ([place]) 08/15/2016   Right cervical radiculopathy 08/09/2016   Essential hypertension, malignant 06/12/2016   History of stroke 03/13/2016   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   Narcolepsy 03/13/2013   Coronary atherosclerosis 03/13/2013   DDD (degenerative disc disease), lumbar    HIRSUTISM 06/02/2009   BIPOLAR DISORDER UNSPECIFIED 05/17/2009   Pain in limb 08/03/2008   Hyperlipidemia 07/15/2007   FATIGUE 07/15/2007    Immunization History  Administered Date(s) Administered   Influenza Split 06/27/2011   Influenza Whole 07/15/2007   Influenza,inj,Quad PF,6+ Mos 07/31/2016   Janssen (J&J) SARS-COV-2 Vaccination 02/04/2020   PFIZER(Purple Top)SARS-COV-2 Vaccination 11/07/2020   Tdap 12/21/2014    Conditions to be addressed/monitored: HTN and HLD  There are no care plans that you recently modified to display for this patient.    Medication Assistance: None required.  Patient affirms current coverage meets needs.  Patient's preferred pharmacy is:  [place], [place] [address] Phone: [phone] Fax: [place], [address] [place] [address] Phone: [phone] Fax: [phone]  Uses  pill box? No - keeps in original bottles & states works well for her. Keeps patch exchange days written on the calendar Pt endorses 100% compliance  Follow Up:  Patient agrees to Care Plan and Follow-up.  Plan: Telephone follow up appointment with care management team member scheduled for:  1 month  Larinda Buttery, PharmD Clinical Pharmacist [facility] [phone]

## 2021-08-23 NOTE — Patient Instructions (Signed)
Visit Information  Thank you for taking time to visit with me today. Please don't hesitate to contact me if I can be of assistance to you before our next scheduled telephone appointment.  Telephone follow up appointment with care management team member scheduled for: 1 month  If you need to cancel or re-schedule our visit, please call [phone] and our care guide team will be happy to assist you.  Following is a list of the goals we discussed today:  Patient Goals/Self-Care Activities Over the next 30 days, patient will:  take medications as prescribed and check blood pressure 2x per week, document, and provide at future appointments  Follow Up Plan: Telephone follow up appointment with care management team member scheduled for:  1 month  Patient verbalizes understanding of instructions provided today and agrees to view in [place].   [name]

## 2021-09-06 ENCOUNTER — Ambulatory Visit: Payer: Medicare Other

## 2021-09-12 NOTE — Progress Notes (Signed)
HPI: FU hypertension. Previous renal Dopplers were technically difficult. There was no stenosis on the left and the right was not well visualized. Follow up CTA to rule out renal artery stenosis was done and was negative. Last Myoview in November 2012 showed an ejection fraction of 71%, breast attenuation but no ischemia. Had CVA 2/22. CTA 2/22 showed no carotid stenosis. Monitor at [facility] 5/22 showed sinus with 10 and 11 beats NSVT. Echo 2/22 at [facility] showed normal LV function, neg saline microcavitation study, mild MR. Since last seen she has some dyspnea on exertion but denies orthopnea, PND, pedal edema, chest pain, palpitations or syncope.  She does have some "disequilibrium" from her previous stroke.  Current Outpatient Medications  Medication Sig Dispense Refill   AMBULATORY NON FORMULARY MEDICATION Walker. Use as needed. Disp 1.  Unable gait R26.81 1 each 0   amitriptyline (ELAVIL) 10 MG tablet Take 2 tablets (20 mg total) by mouth every evening. 60 tablet 11   amLODipine-atorvastatin (CADUET) 10-40 MG tablet Take 1 tablet by mouth once daily 90 tablet 0   amLODipine-olmesartan (AZOR) 10-40 MG tablet Take 1 tablet by mouth every day. NEED APPT/REFILL 90 tablet 1   ASPIRIN LOW DOSE 81 MG EC tablet Take 1 tablet by mouth every day. NEED MD APPT 30 tablet 11   atorvastatin (LIPITOR) 80 MG tablet Take 1 tablet by mouth daily. Please advise pt to make appt with provider for further refills - 2nd attempt 30 tablet 11   busPIRone (BUSPAR) 10 MG tablet Take 1 tablet by mouth 3 times a day 90 tablet 11   cloNIDine (CATAPRES - DOSED IN MG/24 HR) 0.1 mg/24hr patch Place 1 patch (0.1 mg total) onto the skin once a week. 12 patch 1   clopidogrel (PLAVIX) 75 MG tablet Take 1 tablet by mouth once daily 90 tablet 0   diclofenac Sodium (VOLTAREN) 1 % GEL Apply topically.     DULoxetine (CYMBALTA) 60 MG capsule Take 1 capsule by mouth every evening 30 capsule 11   esomeprazole (NEXIUM) 40 MG capsule  Take 40 mg by mouth 2 (two) times daily as needed. 1 capsule daily scheduled, will take an additional capsule if needed     finasteride (PROPECIA) 1 MG tablet Take 1 tablet (1 mg total) by mouth daily. 90 tablet 3   furosemide (LASIX) 40 MG tablet Take 1 tablet by mouth twice daily 60 tablet 11   gabapentin (NEURONTIN) 800 MG tablet Take 1 tablet by mouth 3 times a day 90 tablet 11   hydrALAZINE (APRESOLINE) 100 MG tablet Take 1 tablet (100 mg total) by mouth 3 (three) times daily. 270 tablet 1   labetalol (NORMODYNE) 300 MG tablet Take 2 tablets by mouth twice daily 120 tablet 11   lidocaine (XYLOCAINE) 5 % ointment Apply 1 application topically as needed.     losartan (COZAAR) 100 MG tablet Take 1 tablet by mouth once daily 90 tablet 0   MINOXIDIL, TOPICAL, 5 % SOLN Apply 1 application topically daily. 120 mL PRN   Multiple Vitamins-Minerals (MULTIVITAMINS THER. W/MINERALS) TABS Take 1 tablet by mouth daily.     spironolactone (ALDACTONE) 50 MG tablet Take 1 tablet by mouth every day. NEED MD APPT 30 tablet 11   traZODone (DESYREL) 50 MG tablet Take 50 mg by mouth at bedtime.     VENTOLIN HFA 108 (90 BASE) MCG/ACT inhaler prn     No current facility-administered medications for this visit.  Past Medical History:  Diagnosis Date   Allergy    Anemia    Anxiety    Bipolar disorder (HCC)    CHF (congestive heart failure) (HCC)    Chronic diastolic heart failure ([place])    hospitalized at [facility] regional   Complication of anesthesia    had "breathing problems and ended up in ICU" 03/2010 report on chart   COPD (chronic obstructive pulmonary disease) ([place])    DDD (degenerative disc disease), lumbar    Depression    Diabetes in pregnancy    diet cnontrolled   GERD (gastroesophageal reflux disease)    Glucose intolerance (impaired glucose tolerance)    HLD (hyperlipidemia)    nec/nos   HTN (hypertension)    benign essential   MVP (mitral valve prolapse)    Narcolepsy    Stroke ([place])     Suicide attempt ([place])    hx   Syncope    admx to [facility] in 2/12: Echo with normal LVF; head CT unremarkable, ECG stable, no further w/u   Ulcer     Past Surgical History:  Procedure Laterality Date   AUGMENTATION MAMMAPLASTY  2003   BLADDER SUSPENSION  2002   DENTAL SURGERY  2016   Teeth implants x 4   GASTRIC BYPASS  2014   at [place] MICRODISCECTOMY  09/11/2011   Procedure: LUMBAR LAMINECTOMY/DECOMPRESSION MICRODISCECTOMY;  Surgeon: Tobi Bastos;  Location: [facility] OR[facility];  Service: Orthopedics;  Laterality: Right;  Hemi Laminectomy/Microdiscectomy L5 - S1 on the Right (X-Ray)   PARTIAL HYSTERECTOMY  1979   REPAIR PERONEAL TENDONS ANKLE Left 2008   after injury     Social History   Socioeconomic History   Marital status: Married    Spouse name: Anwitha Mapes   Number of children: 2   Years of education: 13   Highest education level: Some college, no degree  Occupational History   Occupation: legally disabled  Tobacco Use   Smoking status: Former    Types: Cigarettes    Quit date: 10/08/2003    Years since quitting: 17.9   Smokeless tobacco: Never  Vaping Use   Vaping Use: Never used  Substance and Sexual Activity   Alcohol use: Yes    Comment: rare   Drug use: No   Sexual activity: Not on file  Other Topics Concern   Not on file  Social History Narrative   Lives with her spouse and her son. She has two children. She enjoys watching television. She is not able to do a lot of since her stroke in January, 2022.   Social Determinants of Health   Financial Resource Strain: Low Risk    Difficulty of Paying Living Expenses: Not hard at all  Food Insecurity: No Food Insecurity   Worried About Charity fundraiser in the Last Year: Never true   [place] in the Last Year: Never true  Transportation Needs: No Transportation Needs   Lack of Transportation (Medical): No   Lack of  Transportation (Non-Medical): No  Physical Activity: Inactive   Days of Exercise per Week: 0 days   Minutes of Exercise per Session: 0 min  Stress: No Stress Concern Present   Feeling of Stress : Not at all  Social Connections: Moderately Isolated   Frequency of Communication with Friends and Family: More than three times a week   Frequency of Social Gatherings with Friends and Family:  Once a week   Attends Religious Services: Never   Active Member of Clubs or Organizations: No   Attends Archivist Meetings: Never   Marital Status: Married  Human resources officer Violence: Not At Risk   Fear of Current or Ex-Partner: No   Emotionally Abused: No   Physically Abused: No   Sexually Abused: No    Family History  Problem Relation Age of Onset   Heart attack Father 34   Hypertension Father    Diabetes Mother    Hypertension Mother    Stroke Other        grandmother   Colon cancer Neg Hx    Esophageal cancer Neg Hx    Stomach cancer Neg Hx    Rectal cancer Neg Hx     ROS: no fevers or chills, productive cough, hemoptysis, dysphasia, odynophagia, melena, hematochezia, dysuria, hematuria, rash, seizure activity, orthopnea, PND, pedal edema, claudication. Remaining systems are negative.  Physical Exam: Well-developed well-nourished in no acute distress.  Skin is warm and dry.  HEENT is normal.  Neck is supple.  Chest is clear to auscultation with normal expansion.  Cardiovascular exam is regular rate and rhythm.  Abdominal exam nontender or distended. No masses palpated. Extremities show no edema. neuro grossly intact   A/P  1 hypertension-BP elevated.  Does not clear which medication she is taking as her list states she is on 2 ARB's, amlodipine 20 mg daily.  We will contact her and review her medication list with her.  I will then adjust based on those results.  I will check potassium and renal function.   2 previous CVA-no atrial fibrillation noted on FU monitor.   Continue aspirin and Plavix.   3 CAD-based on prior CT showing coronary calcification.  Pt denies CP. Continue aspirin and statin.   4 hyperlipidemia-continue statin.  Her medication list states that she is taking Lipitor 80 mg daily and Caduet.  We will review her medications and adjust as needed.   5 NSVT-Continue beta-blocker.LV function normal.    6 palpitations-symptoms controlled; continue beta-blocker.  Kirk Ruths, MD

## 2021-09-17 ENCOUNTER — Other Ambulatory Visit: Payer: Self-pay | Admitting: Family Medicine

## 2021-09-19 ENCOUNTER — Ambulatory Visit (INDEPENDENT_AMBULATORY_CARE_PROVIDER_SITE_OTHER): Payer: Medicare Other | Admitting: Cardiology

## 2021-09-19 ENCOUNTER — Other Ambulatory Visit: Payer: Self-pay

## 2021-09-19 ENCOUNTER — Encounter: Payer: Self-pay | Admitting: Cardiology

## 2021-09-19 VITALS — BP 136/91 | HR 75 | Ht 66.0 in | Wt 203.8 lb

## 2021-09-19 DIAGNOSIS — E7800 Pure hypercholesterolemia, unspecified: Secondary | ICD-10-CM | POA: Diagnosis not present

## 2021-09-19 DIAGNOSIS — I10 Essential (primary) hypertension: Secondary | ICD-10-CM | POA: Diagnosis not present

## 2021-09-19 DIAGNOSIS — R002 Palpitations: Secondary | ICD-10-CM | POA: Diagnosis not present

## 2021-09-19 DIAGNOSIS — I2510 Atherosclerotic heart disease of native coronary artery without angina pectoris: Secondary | ICD-10-CM

## 2021-09-19 NOTE — Patient Instructions (Signed)
°  Follow-Up: °At [facility], you and your health needs are our priority.  As part of our continuing mission to provide you with exceptional heart care, we have created designated Provider Care Teams.  These Care Teams include your primary Cardiologist (physician) and Advanced Practice Providers (APPs -  Physician Assistants and Nurse Practitioners) who all work together to provide you with the care you need, when you need it. ° °We recommend signing up for the patient portal called "MyChart".  Sign up information is provided on this After Visit Summary.  MyChart is used to connect with patients for Virtual Visits (Telemedicine).  Patients are able to view lab/test results, encounter notes, upcoming appointments, etc.  Non-urgent messages can be sent to your provider as well.   °To learn more about what you can do with MyChart, go to [email or web address].com.   ° °Your next appointment:   °6 month(s) ° °The format for your next appointment:   °In Person ° °Provider:  Brian Crenshaw MD ° °}  ° ° °

## 2021-09-21 ENCOUNTER — Telehealth: Payer: Self-pay | Admitting: *Deleted

## 2021-09-21 MED ORDER — CLONIDINE 0.2 MG/24HR TD PTWK: 0.2000 mg | MEDICATED_PATCH | TRANSDERMAL | 3 refills | Status: DC

## 2021-09-21 NOTE — Telephone Encounter (Signed)
Left message for pt to call, she was seen in the [place] office and was unsure of her medications. Need to go over medications with her looking at the bottles.

## 2021-09-21 NOTE — Telephone Encounter (Signed)
Spoke with pt, medication list updated and forwarded to dr Stanford Breed to review.

## 2021-09-21 NOTE — Telephone Encounter (Signed)
Left message for patient with Dr Creshaw's recommendations.   New script sent to the pharmacy  

## 2021-09-24 ENCOUNTER — Telehealth: Payer: Medicare Other

## 2021-09-24 DIAGNOSIS — I10 Essential (primary) hypertension: Secondary | ICD-10-CM | POA: Diagnosis not present

## 2021-09-24 NOTE — Progress Notes (Deleted)
Chronic Care Management Pharmacy Note  09/24/2021 Name:  [name] MRN:  [id number] DOB:  18-Jun-1956  Summary: addressed HTN, HLD. During medication reconciliation, it was discovered patient is possibly taking duplicate of: - amlodipine (74m total daily dose) - ARBs (olmesartan + losartan) - atorvastatin (12513mtotal daily dose)   Recommendations/Changes made from today's visit: Recommend patient call back to front desk to schedule PCP visit, overdue for HTN follow up. Also recommended patient bring ALL bottles to next office visit for usKoreao determin what she is currently taking.  Plan: f/u with pharmacist in 1 month   Subjective: [name] an [age].o. year old female who is a primary patient of [name], CaRene KocherMD.  The CCM team was consulted for assistance with disease management and care coordination needs.    Engaged with patient by telephone for follow up visit in response to provider referral for pharmacy case management and/or care coordination services.   Consent to Services:  The patient was given information about Chronic Care Management services, agreed to services, and gave verbal consent prior to initiation of services.  Please see initial visit note for detailed documentation.   Patient Care Team: MeHali MarryMD as PCP - General [facility] Pharmacist (Pharmacist)  Recent office visits:  01/23/21 CaBeatrice LecherD - Seen for follow up hypertension.   12/07/20 CaBeatrice LecherMD - Seen for hospital follow up. Xrays done. Return in 6 weeks.    Recent consult visits:  03/21/21 BrKirk RuthsD Cardiology - Seen for follow up hypertension.Labs ordered. Follow up in 6 months.   03/19/21 JoCammy Brochureeurology - Seen for history of stoke. Follow up in 4 months. Return in 3 months.   01/10/21 EvLynne LeaderMD Sports Medicine - Seen for left ankle pain. No notes available.   12/14/20 JoCammy Brochureeurology - Seen for history  of stroke. Follow up in 3 months.   03/03/2 EvLynne LeaderMD Sports medicine - Seen for bilateral chronic knee pain. Xrays done. Prescription for walker given. Return in 2 weeks.  Hospital visits:  Admitted to the hospital on 11/21/20 due to Hypertension. Discharge date was 11/25/20[facility]    [facility] -started plavix 75 mg due to stroke, stopped clonidine 0.13m3mno other changes  Objective:  Lab Results  Component Value Date   CREATININE 0.82 03/28/2021   CREATININE 0.93 12/03/2019   CREATININE 0.94 10/29/2018    Lab Results  Component Value Date   HGBA1C 5.6 04/14/2017       Component Value Date/Time   CHOL 132 03/28/2021 0841   TRIG 54 03/28/2021 0841   HDL 52 03/28/2021 0841   CHOLHDL 2.5 03/28/2021 0841   VLDL 19 04/14/2017 0959   LDLCALC 67 03/28/2021 0841    Hepatic Function Latest Ref Rng & Units 03/28/2021 12/03/2019 02/11/2018  Total Protein 6.1 - 8.1 g/dL 6.7 6.0(L) 7.3  Albumin 3.6 - 5.1 g/dL - - -  AST 10 - 35 U/L _0 ALT 6 - 29 U/L _1 Alk Phosphatase 33 - 130 U/L - - -  Total Bilirubin 0.2 - 1.2 mg/dL 0.7 0.7 0.6    Lab Results  Component Value Date/Time   TSH 0.75 04/14/2017 09:59 AM   TSH 0.676 12/21/2014 10:59 AM    CBC Latest Ref Rng & Units 02/11/2018 04/14/2017 12/21/2014  WBC 3.8 - 10.8 Thousand/uL 8.9  7.1 8.3  Hemoglobin 11.7 - 15.5 g/dL 14.0 12.4 14.6  Hematocrit 35.0 - 45.0 % 41.5 39.2 43.6  Platelets 140 - 400 Thousand/uL 254 228 218     Social History   Tobacco Use  Smoking Status Former   Types: Cigarettes   Quit date: 10/08/2003   Years since quitting: 17.9  Smokeless Tobacco Never   BP Readings from Last 3 Encounters:  09/19/21 (!) 136/91  03/21/21 127/76  01/23/21 114/64   Pulse Readings from Last 3 Encounters:  09/19/21 75  03/21/21 88  01/23/21 70   Wt Readings from Last 3 Encounters:  09/19/21 203 lb 12.8 oz (92.4 kg)  03/21/21 206 lb 6.4  oz (93.6 kg)  01/23/21 206 lb (93.4 kg)    Assessment: Review of patient past medical history, allergies, medications, health status, including review of consultants reports, laboratory and other test data, was performed as part of comprehensive evaluation and provision of chronic care management services.   SDOH:  (Social Determinants of Health) assessments and interventions performed:    CCM Care Plan  Allergies  Allergen Reactions   Codeine Nausea And Vomiting and Nausea Only    Medications Reviewed Today     Reviewed by Lelon Perla, MD (Physician) on 09/19/21 at 1531  Med List Status: <None>   Medication Order Taking? Sig Documenting Provider Last Dose Status Informant  AMBULATORY NON FORMULARY MEDICATION [id number] Yes Walker. Use as needed. Disp 1.  Unable gait R26.81 [name], MD Taking Active   amitriptyline (ELAVIL) 10 MG tablet [id number] Yes Take 2 tablets (20 mg total) by mouth every evening. Hali Marry, MD Taking Active   amLODipine-atorvastatin (CADUET) 10-40 MG tablet [id number] Yes Take 1 tablet by mouth once daily Hali Marry, MD Taking Active   amLODipine-olmesartan (AZOR) 10-40 MG tablet [id number] Yes Take 1 tablet by mouth every day. NEED APPT/REFILL Lelon Perla, MD Taking Active   ASPIRIN LOW DOSE 81 MG EC tablet [id number] Yes Take 1 tablet by mouth every day. NEED MD APPT Lelon Perla, MD Taking Active   atorvastatin (LIPITOR) 80 MG tablet [id number] Yes Take 1 tablet by mouth daily. Please advise pt to make appt with provider for further refills - 2nd attempt Stanford Breed Denice Bors, MD Taking Active   busPIRone (BUSPAR) 10 MG tablet [id number] Yes Take 1 tablet by mouth 3 times a day Hali Marry, MD Taking Active   cloNIDine (CATAPRES - DOSED IN MG/24 HR) 0.1 mg/24hr patch [id number] Yes Place 1 patch (0.1 mg total) onto the skin once a week. Lelon Perla, MD Taking Active   clopidogrel (PLAVIX) 75 MG tablet [id number]  Yes Take 1 tablet by mouth once daily Hali Marry, MD Taking Active   diclofenac Sodium (VOLTAREN) 1 % GEL [id number] Yes Apply topically. [provider] Taking Active   DULoxetine (CYMBALTA) 60 MG capsule [id number] Yes Take 1 capsule by mouth every evening Hali Marry, MD Taking Active   esomeprazole (NEXIUM) 40 MG capsule [id number] Yes Take 40 mg by mouth 2 (two) times daily as needed. 1 capsule daily scheduled, will take an additional capsule if needed [provider] Taking Active   finasteride (PROPECIA) 1 MG tablet [id number] Yes Take 1 tablet (1 mg total) by mouth daily. Hali Marry, MD Taking Active   furosemide (LASIX) 40 MG tablet [id number] Yes Take 1 tablet by mouth twice daily Lelon Perla, MD Taking Active   gabapentin (NEURONTIN) 800 MG tablet [id number]  Yes Take 1 tablet by mouth 3 times a day Hali Marry, MD Taking Active   hydrALAZINE (APRESOLINE) 100 MG tablet [id number] Yes Take 1 tablet (100 mg total) by mouth 3 (three) times daily. Hali Marry, MD Taking Active   labetalol (NORMODYNE) 300 MG tablet [id number] Yes Take 2 tablets by mouth twice daily Lelon Perla, MD Taking Active   lidocaine (XYLOCAINE) 5 % ointment [id number] Yes Apply 1 application topically as needed. [provider] Taking Active   losartan (COZAAR) 100 MG tablet [id number] Yes Take 1 tablet by mouth once daily Hali Marry, MD Taking Active   MINOXIDIL, TOPICAL, 5 % SOLN [id number] Yes Apply 1 application topically daily. Hali Marry, MD Taking Active   Multiple Vitamins-Minerals (MULTIVITAMINS THER. W/MINERALS) Sheral Flow [id number] Yes Take 1 tablet by mouth daily. [provider] Taking Active   spironolactone (ALDACTONE) 50 MG tablet [id number] Yes Take 1 tablet by mouth every day. NEED MD APPT Lelon Perla, MD Taking Active   traZODone (DESYREL) 50 MG tablet [id number] Yes Take 50 mg by mouth at bedtime.  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   Caregiver burden 02/10/2018   Mitral valve prolapse syndrome 08/15/2016   Congestive heart failure ([place]) 08/15/2016   Right cervical radiculopathy 08/09/2016   Essential hypertension, malignant 06/12/2016   History of stroke 03/13/2016   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   Narcolepsy 03/13/2013   Coronary atherosclerosis 03/13/2013   DDD (degenerative disc disease), lumbar    HIRSUTISM 06/02/2009   BIPOLAR DISORDER UNSPECIFIED 05/17/2009   Pain in limb 08/03/2008   Hyperlipidemia 07/15/2007   FATIGUE 07/15/2007    Immunization History  Administered Date(s) Administered   Influenza Split 06/27/2011   Influenza Whole 07/15/2007   Influenza,inj,Quad PF,6+ Mos 07/31/2016   Janssen (J&J) SARS-COV-2 Vaccination 02/04/2020   PFIZER(Purple Top)SARS-COV-2 Vaccination 11/07/2020   Tdap 12/21/2014    Conditions to be addressed/monitored: HTN and HLD  There are no care plans that you recently modified to display for this patient.     Medication Assistance: None required.  Patient affirms current coverage meets needs.  Patient's preferred pharmacy is:  [facility], [place] [address] Phone: [phone] Fax: [place], [place] [address] [place] [address] Phone: [phone] Fax: [phone]   Uses pill box? No -  keeps in original bottles & states works well for her. Keeps patch exchange days written on the calendar Pt endorses 100% compliance  Follow Up:  Patient agrees to Care Plan and Follow-up.  Plan: Telephone follow up appointment with care management team member scheduled for:  1 month  Larinda Buttery, PharmD Clinical Pharmacist [facility] Primary Care At [facility] [phone]

## 2021-09-25 ENCOUNTER — Other Ambulatory Visit: Payer: Self-pay | Admitting: Family Medicine

## 2021-09-25 LAB — SPECIMEN COMPROMISED

## 2021-09-25 LAB — BASIC METABOLIC PANEL
BUN: 8 mg/dL (ref 7–25)
CO2: 27 mmol/L (ref 20–32)
Calcium: 9.4 mg/dL (ref 8.6–10.4)
Chloride: 108 mmol/L (ref 98–110)
Creat: 0.81 mg/dL (ref 0.50–1.05)
Glucose, Bld: 90 mg/dL (ref 65–99)
Potassium: 4.1 mmol/L (ref 3.5–5.3)
Sodium: 142 mmol/L (ref 135–146)

## 2021-09-26 ENCOUNTER — Telehealth: Payer: Self-pay | Admitting: Pharmacist

## 2021-09-26 NOTE — Telephone Encounter (Signed)
Unsuccessful attempt x3 to contact patient for follow-up phone call for CCM services with pharmacist on Monday 09/24/21. No answer, left voicemail.  Attempted to contact again today, 09/26/21 to offer on-site appointment, as this would work best to truly review physical medication bottles and her medication routine.No answer, left voicemail.  Will route to schedule team for reschedule.  [name]

## 2021-09-27 ENCOUNTER — Telehealth: Payer: Self-pay | Admitting: *Deleted

## 2021-09-27 ENCOUNTER — Encounter: Payer: Self-pay | Admitting: *Deleted

## 2021-09-27 NOTE — Chronic Care Management (AMB) (Signed)
°  Care Management   Note  09/27/2021 Name: KRISTINIA LEAVY MRN: [id number] DOB: [birth year]  BRITNI DRISCOLL is a 65 y.o. year old female who is a primary care patient of Hali Marry, MD and is actively engaged with the care management team. I reached out to [name] by phone today to assist with re-scheduling a follow up visit with the Pharmacist  Follow up plan: Unsuccessful telephone outreach attempt made. A HIPAA compliant phone message was left for the patient providing contact information and requesting a return call.   Julian Hy, [place] Management  Direct Dial: [phone]

## 2021-10-03 ENCOUNTER — Ambulatory Visit (INDEPENDENT_AMBULATORY_CARE_PROVIDER_SITE_OTHER): Payer: Medicare Other

## 2021-10-03 ENCOUNTER — Other Ambulatory Visit: Payer: Self-pay

## 2021-10-03 DIAGNOSIS — Z1231 Encounter for screening mammogram for malignant neoplasm of breast: Secondary | ICD-10-CM

## 2021-10-03 DIAGNOSIS — Z0000 Encounter for general adult medical examination without abnormal findings: Secondary | ICD-10-CM

## 2021-10-04 NOTE — Progress Notes (Unsigned)
error 

## 2021-10-04 NOTE — Progress Notes (Signed)
Please call patient. Normal mammogram.  Repeat in 1 year.  

## 2021-10-14 DIAGNOSIS — Z20822 Contact with and (suspected) exposure to covid-19: Secondary | ICD-10-CM | POA: Diagnosis not present

## 2021-10-20 ENCOUNTER — Other Ambulatory Visit: Payer: Self-pay | Admitting: Family Medicine

## 2021-10-26 NOTE — Chronic Care Management (AMB) (Signed)
°  Chronic Care Management   Note  10/26/2021 Name: [name] MRN: [id number] DOB: 02-14-1956  [name] is a 66 y.o. year old female who is a primary care patient of [name], Rene Kocher, MD. [name] is currently enrolled in care management services. An additional referral for Pharm D was placed.   Follow up plan: 2nd Unsuccessful telephone outreach attempt made. A HIPAA compliant phone message was left for the patient providing contact information and requesting a return call.   Julian Hy, [place] Management  Direct Dial: [phone]

## 2021-10-29 ENCOUNTER — Telehealth: Payer: Self-pay | Admitting: *Deleted

## 2021-10-29 NOTE — Chronic Care Management (AMB) (Signed)
°  Care Management   Note  10/29/2021 Name: LAURINA FISCHL MRN: [id number] DOB: [birth year]  ATHALIA SETTERLUND is a 66 y.o. year old female who is a primary care patient of Hali Marry, MD and is actively engaged with the care management team. I reached out to [name] by phone today to assist with re-scheduling a follow up visit with the Pharmacist  Follow up plan: Face to Face appointment with care management team member scheduled for:  11/07/2021  Julian Hy, [place], [place] Management  Direct Dial: [phone]

## 2021-10-29 NOTE — Chronic Care Management (AMB) (Signed)
Duplicate phone encounter - opened in error

## 2021-11-06 NOTE — Progress Notes (Signed)
Chronic Care Management Pharmacy Note  11/07/2021 Name:  [name] MRN:  [id number] DOB:  07-Jun-1956  Summary: addressed HTN, HLD. During medication reconciliation, it was discovered patient is taking duplicate of:  - ARBs (olmesartan + losartan)  Patient states blood pressure at home runs ~ SBP 150-160 if stressed or busy, and occasionally as low as SBP 130s.   Recommendations/Changes made from today's visit:  - Recommend stop amlodipine/olmesartan 10-458m combination - Recommend send RX for solely amlodipine 134mdaily - Recommend increase clonidine patch to 0.58m22meekly  Plan: f/u with pharmacist in 4-5 months   Subjective: [name] an [age]o. year old female who is a primary patient of [name], [name].  The CCM team was consulted for assistance with disease management and care coordination needs.    Engaged with patient by telephone for follow up visit in response to provider referral for pharmacy case management and/or care coordination services.   Consent to Services:  The patient was given information about Chronic Care Management services, agreed to services, and gave verbal consent prior to initiation of services.  Please see initial visit note for detailed documentation.   Patient Care Team: [name] as PCP - General [name] Pharmacist (Pharmacist)  Recent office visits:  01/23/21 CatBeatrice [name] - Seen for follow up hypertension.   12/07/20 CatBeatrice LecherD - Seen for [name] follow up. Xrays done. Return in 6 weeks.    Recent consult visits:  03/21/21 [name] Cardiology - Seen for follow up hypertension.Labs ordered. Follow up in 6 months.   03/19/21 [name] - Seen for history of stoke. Follow up in 4 months. Return in 3 months.   01/10/21 [name] Sports Medicine - Seen for left ankle pain. No notes available.   12/14/20 [name] - Seen for history of  stroke. Follow up in 3 months.   03/03/2 [name] Sports medicine - Seen for bilateral chronic knee pain. Xrays done. Prescription for walker given. Return in 2 weeks.  [name] visits:  Admitted to the [name] on 11/21/20 due to Hypertension. Discharge date was 11/25/20. [name]   [facility] -started plavix 75 mg due to stroke, stopped clonidine 0.2mg76mo other changes  Objective:  Lab Results  Component Value Date   CREATININE 0.81 09/24/2021   CREATININE 0.82 03/28/2021   CREATININE 0.93 12/03/2019    Lab Results  Component Value Date   HGBA1C 5.6 04/14/2017       Component Value Date/Time   CHOL 132 03/28/2021 0841   TRIG 54 03/28/2021 0841   HDL 52 03/28/2021 0841   CHOLHDL 2.5 03/28/2021 0841   VLDL 19 04/14/2017 0959   LDLCALC 67 03/28/2021 0841    Hepatic Function Latest Ref Rng & Units 03/28/2021 12/03/2019 02/11/2018  Total Protein 6.1 - 8.1 g/dL 6.7 6.0(L) 7.3  Albumin 3.6 - 5.1 g/dL - - -  AST 10 - 35 U/L _0 ALT 6 - 29 U/L _1 Alk Phosphatase 33 - 130 U/L - - -  Total Bilirubin 0.2 - 1.2 mg/dL 0.7 0.7 0.6    Lab Results  Component Value Date/Time   TSH 0.75 04/14/2017 09:59 AM   TSH 0.676 12/21/2014 10:59 AM    CBC Latest Ref Rng & Units 02/11/2018 04/14/2017 12/21/2014  WBC 3.8 - 10.8 Thousand/uL 8.9 7.1  8.3  Hemoglobin 11.7 - 15.5 g/dL 14.0 12.4 14.6  Hematocrit 35.0 - 45.0 % 41.5 39.2 43.6  Platelets 140 - 400 Thousand/uL 254 228 218     Social History   Tobacco Use  Smoking Status Former   Types: Cigarettes   Quit date: 10/08/2003   Years since quitting: 18.0  Smokeless Tobacco Never   BP Readings from Last 3 Encounters:  11/07/21 (!) 142/89  09/19/21 (!) 136/91  03/21/21 127/76   Pulse Readings from Last 3 Encounters:  11/07/21 71  09/19/21 75  03/21/21 88   Wt Readings from Last 3 Encounters:  09/19/21 203 lb 12.8 oz (92.4 kg)  03/21/21 206 lb 6.4  oz (93.6 kg)  01/23/21 206 lb (93.4 kg)    Assessment: Review of patient past medical history, allergies, medications, health status, including review of consultants reports, laboratory and other test data, was performed as part of comprehensive evaluation and provision of chronic care management services.   SDOH:  (Social Determinants of Health) assessments and interventions performed:    CCM Care Plan  Allergies  Allergen Reactions   Codeine Nausea And Vomiting and Nausea Only    Medications Reviewed Today     Reviewed by [name], [facility] (Pharmacist) on 11/07/21 at [place] List Status: <None>   Medication Order Taking? Sig Documenting Provider Last Dose Status Informant  AMBULATORY NON FORMULARY MEDICATION [id number] Yes Walker. Use as needed. Disp 1.  Unable gait R26.81 [name], MD Taking Active   amLODipine-olmesartan (AZOR) 10-40 MG tablet [id number] Yes Take 1 tablet by mouth every day. NEED APPT/REFILL Lelon Perla, MD Taking Active   ASPIRIN LOW DOSE 81 MG EC tablet [id number] Yes Take 1 tablet by mouth every day. NEED MD APPT Lelon Perla, MD Taking Active   atorvastatin (LIPITOR) 80 MG tablet [id number] Yes Take 1 tablet by mouth daily. Please advise pt to make appt with provider for further refills - 2nd attempt Lelon Perla, MD Taking Active   busPIRone (BUSPAR) 10 MG tablet [id number] Yes Take 1 tablet by mouth 3 times a day Hali Marry, MD Taking Active   cloNIDine (CATAPRES - DOSED IN MG/24 HR) 0.2 mg/24hr patch [id number] Yes Place 1 patch (0.2 mg total) onto the skin once a week. Lelon Perla, MD Taking Active   clopidogrel (PLAVIX) 75 MG tablet [id number] Yes Take 1 tablet by mouth once daily Hali Marry, MD Taking Active   diclofenac Sodium (VOLTAREN) 1 % GEL [id number] Yes Apply 4 grams to the skin four times a day Hali Marry, MD Taking Active   DULoxetine (CYMBALTA) 60 MG capsule [id number] Yes Take 1 capsule by  mouth every evening Hali Marry, MD Taking Active   furosemide (LASIX) 40 MG tablet [id number] Yes Take 1 tablet by mouth twice daily Lelon Perla, MD Taking Active   gabapentin (NEURONTIN) 800 MG tablet [id number] Yes Take 1 tablet by mouth 3 times a day Hali Marry, MD Taking Active   hydrALAZINE (APRESOLINE) 100 MG tablet [id number] Yes Take 1 tablet (100 mg total) by mouth 3 (three) times daily. LAST REFILL. OVERDUE FOR A VISIT. Hali Marry, MD Taking Active   labetalol (NORMODYNE) 300 MG tablet [id number] Yes Take 2 tablets by mouth twice daily Lelon Perla, MD Taking Active   lidocaine (XYLOCAINE) 5 % ointment [id number] Yes Apply 1 application topically as needed. [provider] Taking Active   losartan (COZAAR) 100 MG tablet  [id number] Yes Take 1 tablet by mouth once daily Hali Marry, MD Taking Active   MINOXIDIL, TOPICAL, 5 % SOLN [id number] Yes Apply 1 application topically daily. Hali Marry, MD Taking Active   Multiple Vitamins-Minerals (MULTIVITAMINS THER. W/MINERALS) Sheral Flow [id number] Yes Take 1 tablet by mouth daily. [provider] Taking Active   spironolactone (ALDACTONE) 50 MG tablet [id number] Yes Take 1 tablet by mouth every day. NEED MD APPT Lelon Perla, MD Taking Active   traZODone (DESYREL) 50 MG tablet [id number]  Take 50 mg by mouth at bedtime. 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   Caregiver burden 02/10/2018    Mitral valve prolapse syndrome 08/15/2016   Congestive heart failure ([place]) 08/15/2016   Right cervical radiculopathy 08/09/2016   Essential hypertension, malignant 06/12/2016   History of stroke 03/13/2016   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   Narcolepsy 03/13/2013   Coronary atherosclerosis 03/13/2013   DDD (degenerative disc disease), lumbar    HIRSUTISM 06/02/2009   BIPOLAR DISORDER UNSPECIFIED 05/17/2009   Pain in limb 08/03/2008   Hyperlipidemia 07/15/2007   FATIGUE 07/15/2007    Immunization History  Administered Date(s) Administered   Influenza Split 06/27/2011   Influenza Whole 07/15/2007   Influenza,inj,Quad PF,6+ Mos 07/31/2016   Janssen (J&J) SARS-COV-2 Vaccination 02/04/2020   PFIZER(Purple Top)SARS-COV-2 Vaccination 11/07/2020   Tdap 12/21/2014    Conditions to be addressed/monitored: HTN and HLD  Care Plan : Medication Management  Updates made by [name], RPH since 11/07/2021 12:00 AM     Problem: HTN, HLD      Long-Range Goal: Disease Progression Prevention   Start Date: 04/24/2021  Recent Progress: On track  Priority: High  Note:   Current Barriers:  None at present  Pharmacist Clinical Goal(s):  Over the next 90 days, patient will adhere to plan to optimize therapeutic regimen for hypertension as evidenced by report of adherence to recommended medication management changes through collaboration with PharmD and provider.   Interventions: 1:1 collaboration with Hali Marry, MD regarding development and update of comprehensive plan of care as evidenced by provider attestation and co-signature Inter-disciplinary care team collaboration (see longitudinal plan of care) Comprehensive medication review performed; medication list updated in electronic medical record  Hypertension:  Uncontrolled; current treatment:amlodipine-olmesartan 10-55m, clonidine 0.138mpatch, hydralazine 10074mID, Labetalol 150m32mD, Lasix 40mg1m (pt  states not taking), spironolactone 50mg 70my;   Current home readings: SBP 140-150s.  Denies hypotensive/hypertensive symptoms  Recommended continue current regimen, take BP at home, and bring numbers to future appointments and see PCP for HTN follow-up Hyperlipidemia:  Controlled; current treatment:atorvastatin 80mg; 26mcommended continue current regimen  Patient Goals/Self-Care Activities Over the next 90 days, patient will:  take medications as prescribed and check blood pressure 2x per week, document, and provide at future appointments  Follow Up Plan: Telephone follow up appointment with care management team member scheduled for:  4-5 months  Medication Assistance: None required.  Patient affirms current coverage meets needs.  Patient's preferred pharmacy is:  [facility], [place] [address] Phone: [phone] Fax: [place], [place] [address] [place] [address] Phone: [phone] Fax: [phone]   Uses pill box? No - keeps in original bottles & states works well for her. Keeps patch exchange days written on the calendar Pt endorses 100% compliance  Follow Up:  Patient agrees to Care Plan and Follow-up.  Plan: Telephone follow up appointment with care management team member scheduled for:  1 month  Larinda Buttery, PharmD Clinical Pharmacist [facility] Primary Care At [name] [phone]

## 2021-11-07 ENCOUNTER — Ambulatory Visit (INDEPENDENT_AMBULATORY_CARE_PROVIDER_SITE_OTHER): Payer: Medicare Other | Admitting: Pharmacist

## 2021-11-07 ENCOUNTER — Other Ambulatory Visit: Payer: Self-pay

## 2021-11-07 VITALS — BP 142/89 | HR 71

## 2021-11-07 DIAGNOSIS — E782 Mixed hyperlipidemia: Secondary | ICD-10-CM

## 2021-11-07 DIAGNOSIS — I10 Essential (primary) hypertension: Secondary | ICD-10-CM

## 2021-11-07 MED ORDER — CLONIDINE 0.3 MG/24HR TD PTWK: 0.3000 mg | MEDICATED_PATCH | TRANSDERMAL | 1 refills | Status: DC

## 2021-11-07 MED ORDER — AMLODIPINE BESYLATE 10 MG PO TABS: 10.0000 mg | ORAL_TABLET | Freq: Every day | ORAL | 1 refills | Status: DC

## 2021-11-07 NOTE — Addendum Note (Signed)
Addended by: Beatrice Lecher D on: 11/07/2021 08:41 PM   Modules accepted: Orders

## 2021-11-07 NOTE — Progress Notes (Signed)
Meds ordered this encounter  Medications   cloNIDine (CATAPRES - DOSED IN MG/24 HR) 0.3 mg/24hr patch    Sig: Place 1 patch (0.3 mg total) onto the skin once a week.    Dispense:  12 patch    Refill:  1   amLODipine (NORVASC) 10 MG tablet    Sig: Take 1 tablet (10 mg total) by mouth daily.    Dispense:  90 tablet    Refill:  1   Changes made as above.

## 2021-11-07 NOTE — Patient Instructions (Signed)
Visit Information  Thank you for taking time to visit with me today. Please don't hesitate to contact me if I can be of assistance to you before our next scheduled telephone appointment.  Following are the goals we discussed today:  Patient Goals/Self-Care Activities Over the next 90 days, patient will:  take medications as prescribed and check blood pressure 2x per week, document, and provide at future appointments  Follow Up Plan: Telephone follow up appointment with care management team member scheduled for:  4-5 months  Please call the care guide team at [phone] if you need to cancel or reschedule your appointment.    Patient verbalizes understanding of instructions and care plan provided today and agrees to view in [place]. Active MyChart status confirmed with patient.    [name]

## 2021-11-16 ENCOUNTER — Other Ambulatory Visit: Payer: Self-pay

## 2021-11-16 ENCOUNTER — Ambulatory Visit (INDEPENDENT_AMBULATORY_CARE_PROVIDER_SITE_OTHER): Payer: Medicare Other | Admitting: Podiatry

## 2021-11-16 ENCOUNTER — Encounter: Payer: Self-pay | Admitting: Podiatry

## 2021-11-16 DIAGNOSIS — Q828 Other specified congenital malformations of skin: Secondary | ICD-10-CM

## 2021-11-16 DIAGNOSIS — M7661 Achilles tendinitis, right leg: Secondary | ICD-10-CM

## 2021-11-16 MED ORDER — MELOXICAM 15 MG PO TABS: 15.0000 mg | ORAL_TABLET | Freq: Every day | ORAL | 0 refills | Status: DC

## 2021-11-16 NOTE — Progress Notes (Signed)
°  Subjective:  Patient ID: [name], female    DOB: Jan 18, 1956,   MRN: [id number]  Chief Complaint  Patient presents with   Plantar Warts    (np) left foot plantars wart/right foot achillies pain/soreness    66 y.o. female presents for concern of lesions on bilateral feel particularly the left heel as well as pain in her right achilles tendon area. Realtes she is having a lot of pain with walking. Has not tried any treatments. Has a history of stroke and is on Plavix.  . Denies any other pedal complaints. Denies n/v/f/c.   Past Medical History:  Diagnosis Date   Allergy    Anemia    Anxiety    Bipolar disorder (HCC)    CHF (congestive heart failure) (HCC)    Chronic diastolic heart failure ([place])    hospitalized at [facility] regional   Complication of anesthesia    had "breathing problems and ended up in ICU" 03/2010 report on chart   COPD (chronic obstructive pulmonary disease) ([place])    DDD (degenerative disc disease), lumbar    Depression    Diabetes in pregnancy    diet cnontrolled   GERD (gastroesophageal reflux disease)    Glucose intolerance (impaired glucose tolerance)    HLD (hyperlipidemia)    nec/nos   HTN (hypertension)    benign essential   MVP (mitral valve prolapse)    Narcolepsy    Stroke ([place])    Suicide attempt ([place])    hx   Syncope    admx to [facility] in 2/12: Echo with normal LVF; head CT unremarkable, ECG stable, no further w/u   Ulcer     Objective:  Physical Exam: Vascular: DP/PT pulses 2/4 bilateral. CFT <3 seconds. Normal hair growth on digits. No edema.  Skin. No lacerations or abrasions bilateral feet. Cored hyperkeratotic lesion noted to plantar left heal as well as right second sub metatarsal.  Musculoskeletal: MMT 5/5 bilateral lower extremities in DF, PF, Inversion and Eversion. Deceased ROM in DF of ankle joint. Tender to insertion of achilles tendon on right. No palpable defect. Pain with DF and PF of the ankle. No decreased muscle  strength noted.  Neurological: Sensation intact to light touch.   Assessment:   1. Porokeratosis   2. Tendonitis, Achilles, right      Plan:  Patient was evaluated and treated and all questions answered. -Discussed corns and calluses with patient and treatment options.  -Hyperkeratotic tissue was debrided with chisel without incident.  -Applied salycylic acid treatment to area with dressing. Advised to remove bandaging tomorrow.  -Encouraged daily moisturizing -Discussed use of pumice stone -Advised good supportive shoes and inserts -Discussed Achilles insertional tendonitis and treatment options with patient.  -Discussed stretching exercises. -Rx Meloxicam provided  -Heel lifts provided and discussed proper shoewear.  -Discussed if no improvement will consider MRI/PT/EPAT/PRP injections.  -Patient to return to office as needed or sooner if condition worsens.    Lorenda Peck, DPM

## 2021-11-16 NOTE — Patient Instructions (Signed)

## 2021-11-24 ENCOUNTER — Other Ambulatory Visit: Payer: Self-pay | Admitting: Family Medicine

## 2021-12-03 ENCOUNTER — Other Ambulatory Visit: Payer: Self-pay | Admitting: Family Medicine

## 2021-12-04 DIAGNOSIS — E782 Mixed hyperlipidemia: Secondary | ICD-10-CM

## 2021-12-04 DIAGNOSIS — I10 Essential (primary) hypertension: Secondary | ICD-10-CM | POA: Diagnosis not present

## 2021-12-12 DIAGNOSIS — D2372 Other benign neoplasm of skin of left lower limb, including hip: Secondary | ICD-10-CM | POA: Diagnosis not present

## 2021-12-13 DIAGNOSIS — Z20822 Contact with and (suspected) exposure to covid-19: Secondary | ICD-10-CM | POA: Diagnosis not present

## 2021-12-24 ENCOUNTER — Other Ambulatory Visit: Payer: Self-pay | Admitting: Family Medicine

## 2021-12-28 ENCOUNTER — Ambulatory Visit (INDEPENDENT_AMBULATORY_CARE_PROVIDER_SITE_OTHER): Payer: Medicare Other | Admitting: Family Medicine

## 2021-12-28 ENCOUNTER — Other Ambulatory Visit: Payer: Self-pay | Admitting: Family Medicine

## 2021-12-28 ENCOUNTER — Other Ambulatory Visit: Payer: Self-pay

## 2021-12-28 ENCOUNTER — Encounter: Payer: Self-pay | Admitting: Family Medicine

## 2021-12-28 ENCOUNTER — Telehealth: Payer: Self-pay | Admitting: Family Medicine

## 2021-12-28 VITALS — BP 107/54 | HR 72 | Temp 98.2°F | Resp 16 | Ht 66.0 in | Wt 203.0 lb

## 2021-12-28 DIAGNOSIS — I10 Essential (primary) hypertension: Secondary | ICD-10-CM

## 2021-12-28 DIAGNOSIS — I509 Heart failure, unspecified: Secondary | ICD-10-CM | POA: Diagnosis not present

## 2021-12-28 DIAGNOSIS — M5136 Other intervertebral disc degeneration, lumbar region: Secondary | ICD-10-CM

## 2021-12-28 DIAGNOSIS — R002 Palpitations: Secondary | ICD-10-CM | POA: Diagnosis not present

## 2021-12-28 DIAGNOSIS — Z636 Dependent relative needing care at home: Secondary | ICD-10-CM

## 2021-12-28 NOTE — Assessment & Plan Note (Signed)
Blood pressure looks phenomenal today.  I know she does have a pretty large pill burden but this does seem to be effective. ?

## 2021-12-28 NOTE — Assessment & Plan Note (Signed)
She is not sure that the BuSpar is actually helpful so we did discuss maybe tapering off of it if there really is not a big difference on it versus off of it at least that would reduce some pill burden for her. ?

## 2021-12-28 NOTE — Assessment & Plan Note (Signed)
Continue gabapentin and cymbalta. 

## 2021-12-28 NOTE — Telephone Encounter (Signed)
Please call patient: I know we had discussed maybe decreasing the BuSpar when you were here.  Have her drop down to twice a day for 5 days and then once a day for 5 days and then stop completely.  If she feels like the medication was helping then she can always restart it but if not then we can discontinue it and take it off her med list and she can cancel any refills that she has left at the pharmacy. ?

## 2021-12-28 NOTE — Progress Notes (Signed)
? ?Established Patient Office Visit ? ?Subjective:  ?Patient ID: [name], female    DOB: 01-09-1956  Age: 66 y.o. MRN: [id number] ? ?CC:  ?Chief Complaint  ?Patient presents with  ? Hypertension  ?     ? ? ?HPI ?[name] presents for  ? ?Hypertension- Pt denies chest pain, SOB, dizziness, or heart palpitations.  Taking meds as directed w/o problems.  Denies medication side effects.  There was some confusion and duplications about her medication several months ago but I think we got that all completely straightened out she is on a good regimen.  Though she does take a lot of medications and says sometimes she just gets tired of feeling like she has to take all of them.  She did see the cardiologist back in December.  She denies any recent lower extremity swelling or shortness of breath. ? ?Reports that she does get occasional Palpitations maybe once a week or every couple of weeks.  She says it usually just last for a minute or so.  She will feel a little lightheaded with it like she might pass out and then she feels exhausted afterwards. ? ?She is also been under a lot of stress recently.  She is still the primary caregiver for her husband and recently he bought a puppy and it has been extremely difficult he is not trained and her husband really does not do the best taking care of him so she ends up taking care of the dog and it is really been a big source of stress for her lately ? ?She is also scheduled for surgery on May 11 to have a cyst removed off the bottom of her left heel is significantly impairing her ability to walk because it is directly in the middle bottom of her heel.  She has a lot of pain with it as well especially with any pressure.  Brought in a form with her today to be completed for authorization. ? ?Past Medical History:  ?Diagnosis Date  ? Allergy   ? Anemia   ? Anxiety   ? Bipolar disorder ([place])   ? CHF (congestive heart failure) ([place])   ? Chronic diastolic heart failure ([place])   ?  hospitalized at [facility] regional  ? Closed nondisplaced fracture of phalanx of toe of left foot 12/07/2020  ? Complication of anesthesia   ? had "breathing problems and ended up in ICU" 03/2010 report on chart  ? COPD (chronic obstructive pulmonary disease) ([place])   ? DDD (degenerative disc disease), lumbar   ? Depression   ? Diabetes in pregnancy   ? diet cnontrolled  ? GERD (gastroesophageal reflux disease)   ? Glucose intolerance (impaired glucose tolerance)   ? HLD (hyperlipidemia)   ? nec/nos  ? HTN (hypertension)   ? benign essential  ? MVP (mitral valve prolapse)   ? Narcolepsy   ? Stroke [facility])   ? Suicide attempt [facility])   ? hx  ? Syncope   ? admx to [facility] in 2/12: Echo with normal LVF; head CT unremarkable, ECG stable, no further w/u  ? Ulcer   ? ? ?Past Surgical History:  ?Procedure Laterality Date  ? AUGMENTATION MAMMAPLASTY  2003  ? BLADDER SUSPENSION  2002  ? DENTAL SURGERY  2016  ? Teeth implants x 4  ? GASTRIC BYPASS  2014  ? at [facility]  ? GASTRIC FUNDOPLICATION    ? LUMBAR LAMINECTOMY/DECOMPRESSION MICRODISCECTOMY  09/11/2011  ? Procedure:  LUMBAR LAMINECTOMY/DECOMPRESSION MICRODISCECTOMY;  Surgeon: Tobi Bastos;  Location: [facility];  Service: Orthopedics;  Laterality: Right;  Hemi Laminectomy/Microdiscectomy L5 - S1 on the Right (X-Ray)  ? PARTIAL HYSTERECTOMY  1979  ? REPAIR PERONEAL TENDONS ANKLE Left 2008  ? after injury   ? ? ?Family History  ?Problem Relation Age of Onset  ? Heart attack Father 65  ? Hypertension Father   ? Diabetes Mother   ? Hypertension Mother   ? Stroke Other   ?     grandmother  ? Colon cancer Neg Hx   ? Esophageal cancer Neg Hx   ? Stomach cancer Neg Hx   ? Rectal cancer Neg Hx   ? ? ?Social History  ? ?Socioeconomic History  ? Marital status: Married  ?  Spouse name: Linzy Laury  ? Number of children: 2  ? Years of education: 17  ? Highest education level: Some college, no degree  ?Occupational History  ? Occupation: legally disabled  ?Tobacco Use  ?  Smoking status: Former  ?  Types: Cigarettes  ?  Quit date: 10/08/2003  ?  Years since quitting: 18.2  ? Smokeless tobacco: Never  ?Vaping Use  ? Vaping Use: Never used  ?Substance and Sexual Activity  ? Alcohol use: Yes  ?  Comment: rare  ? Drug use: No  ? Sexual activity: Not on file  ?Other Topics Concern  ? Not on file  ?Social History Narrative  ? Lives with her spouse and her son. She has two children. She enjoys watching television. She is not able to do a lot of since her stroke in January, 2022.  ? ?Social Determinants of Health  ? ?Financial Resource Strain: Low Risk   ? Difficulty of Paying Living Expenses: Not hard at all  ?Food Insecurity: No Food Insecurity  ? Worried About Charity fundraiser in the Last Year: Never true  ? Ran Out of Food in the Last Year: Never true  ?Transportation Needs: No Transportation Needs  ? Lack of Transportation (Medical): No  ? Lack of Transportation (Non-Medical): No  ?Physical Activity: Inactive  ? Days of Exercise per Week: 0 days  ? Minutes of Exercise per Session: 0 min  ?Stress: No Stress Concern Present  ? Feeling of Stress : Not at all  ?Social Connections: Moderately Isolated  ? Frequency of Communication with Friends and Family: More than three times a week  ? Frequency of Social Gatherings with Friends and Family: Once a week  ? Attends Religious Services: Never  ? Active Member of Clubs or Organizations: No  ? Attends Archivist Meetings: Never  ? Marital Status: Married  ?Intimate Partner Violence: Not At Risk  ? Fear of Current or Ex-Partner: No  ? Emotionally Abused: No  ? Physically Abused: No  ? Sexually Abused: No  ? ? ?Outpatient Medications Prior to Visit  ?Medication Sig Dispense Refill  ? AMBULATORY NON FORMULARY MEDICATION Walker. Use as needed. Disp 1.  ?Unable gait R26.81 1 each 0  ? amLODipine (NORVASC) 10 MG tablet Take 1 tablet (10 mg total) by mouth daily. 90 tablet 1  ? ASPIRIN LOW DOSE 81 MG EC tablet Take 1 tablet by mouth every  day. NEED MD APPT 30 tablet 11  ? atorvastatin (LIPITOR) 80 MG tablet Take 1 tablet by mouth daily. Please advise pt to make appt with provider for further refills - 2nd attempt 30 tablet 11  ? busPIRone (BUSPAR) 10 MG tablet Take 1  tablet by mouth 3 times a day 90 tablet 11  ? cloNIDine (CATAPRES - DOSED IN MG/24 HR) 0.3 mg/24hr patch Place 1 patch (0.3 mg total) onto the skin once a week. 12 patch 1  ? clopidogrel (PLAVIX) 75 MG tablet Take 1 tablet by mouth once daily 90 tablet 0  ? diclofenac Sodium (VOLTAREN) 1 % GEL Apply 4 grams to the skin four times a day 500 g 11  ? DULoxetine (CYMBALTA) 60 MG capsule Take 1 capsule by mouth every evening 30 capsule 11  ? furosemide (LASIX) 40 MG tablet Take 1 tablet by mouth twice daily 60 tablet 11  ? gabapentin (NEURONTIN) 800 MG tablet Take 1 tablet by mouth 3 times a day 90 tablet 11  ? hydrALAZINE (APRESOLINE) 100 MG tablet Take 1 tablet (100 mg total) by mouth 3 (three) times daily. LAST REFILL. OVERDUE FOR A VISIT. 90 tablet 0  ? labetalol (NORMODYNE) 300 MG tablet Take 2 tablets by mouth twice daily 120 tablet 11  ? losartan (COZAAR) 100 MG tablet Take 1 tablet by mouth once daily 90 tablet 1  ? meloxicam (MOBIC) 15 MG tablet Take 1 tablet (15 mg total) by mouth daily. 30 tablet 0  ? MINOXIDIL, TOPICAL, 5 % SOLN Apply 1 application topically daily. 120 mL PRN  ? Multiple Vitamins-Minerals (MULTIVITAMINS THER. W/MINERALS) TABS Take 1 tablet by mouth daily.    ? spironolactone (ALDACTONE) 50 MG tablet Take 1 tablet by mouth every day. NEED MD APPT 30 tablet 11  ? traZODone (DESYREL) 50 MG tablet Take 50 mg by mouth at bedtime.    ? VENTOLIN HFA 108 (90 BASE) MCG/ACT inhaler prn    ? lidocaine (XYLOCAINE) 5 % ointment Apply 1 application topically as needed.    ? ?No facility-administered medications prior to visit.  ? ? ?Allergies  ?Allergen Reactions  ? Codeine Nausea And Vomiting and Nausea Only  ? ? ?ROS ?Review of Systems ? ?  ?Objective:  ?  ?Physical  Exam ? ?BP (!) 107/54   Pulse 72   Temp 98.2 ?F (36.8 ?C)   Resp 16   Ht '5\' 6"'$  (1.676 m)   Wt 203 lb (92.1 kg)   SpO2 100%   BMI 32.77 kg/m?  ?Wt Readings from Last 3 Encounters:  ?12/28/21 203 lb (92.1 kg)  ?09/19/21

## 2021-12-28 NOTE — Assessment & Plan Note (Signed)
No sign of volume overload.  Blood pressure looks fantastic today.  Labs are up-to-date. ?

## 2021-12-31 ENCOUNTER — Ambulatory Visit (INDEPENDENT_AMBULATORY_CARE_PROVIDER_SITE_OTHER): Payer: Medicare Other

## 2021-12-31 DIAGNOSIS — R002 Palpitations: Secondary | ICD-10-CM

## 2021-12-31 NOTE — Telephone Encounter (Signed)
LVM advising pt of Dr. Gardiner Ramus recommendations and advised her to call back if she has any questions/concerns.  ?

## 2021-12-31 NOTE — Progress Notes (Unsigned)
Enrolled for [employer] to mail a ZIO XT long term holter monitor to the patients address on file.  

## 2022-01-03 DIAGNOSIS — R002 Palpitations: Secondary | ICD-10-CM | POA: Diagnosis not present

## 2022-01-12 ENCOUNTER — Other Ambulatory Visit: Payer: Self-pay | Admitting: Cardiology

## 2022-01-15 ENCOUNTER — Other Ambulatory Visit: Payer: Self-pay

## 2022-01-15 DIAGNOSIS — I10 Essential (primary) hypertension: Secondary | ICD-10-CM

## 2022-01-15 MED ORDER — HYDRALAZINE HCL 100 MG PO TABS: 100.0000 mg | ORAL_TABLET | Freq: Three times a day (TID) | ORAL | 1 refills | Status: DC

## 2022-01-23 DIAGNOSIS — F319 Bipolar disorder, unspecified: Secondary | ICD-10-CM | POA: Diagnosis not present

## 2022-01-23 DIAGNOSIS — E1169 Type 2 diabetes mellitus with other specified complication: Secondary | ICD-10-CM | POA: Diagnosis not present

## 2022-01-23 DIAGNOSIS — Z8673 Personal history of transient ischemic attack (TIA), and cerebral infarction without residual deficits: Secondary | ICD-10-CM | POA: Diagnosis not present

## 2022-01-23 DIAGNOSIS — I509 Heart failure, unspecified: Secondary | ICD-10-CM | POA: Diagnosis not present

## 2022-01-28 DIAGNOSIS — R002 Palpitations: Secondary | ICD-10-CM | POA: Diagnosis not present

## 2022-01-29 NOTE — Progress Notes (Signed)
[name], heart monitor shows the lowest heart rate was 49 bpm with the average around 78.  They did capture short runs of what they call ventricular tachycardia or V. tach.  They also caught short runs of something called supraventricular tachycardia which is coming more from the top part of the heart.  These did seem to be associated with some of the symptomatic events when you actually felt something.'s are similar to some of the abnormal heart rhythms that we saw previously.  The main treatment for this is to take your labetalol regularly which is the beta-blocker.  We do have some room to adjust that upward that certainly an option that we could consider to better control the palpitations.  If we did go up then we just have to watch her blood pressure carefully and we may even have to adjust her amlodipine down to compensate.  Just let me know what you would like to do.

## 2022-01-30 ENCOUNTER — Telehealth: Payer: Self-pay | Admitting: Cardiology

## 2022-01-30 NOTE — Telephone Encounter (Signed)
PT called and stated she had concerns with new findings concerning her heart, PT also stated she has an upcoming surgical procedure and wanted to know if her medications should be changed in response to the new findings and her procedure.  ?

## 2022-01-30 NOTE — Progress Notes (Signed)
Okay, patient is seeing the cardiologist soon and can address this with him and they can make any changes needed.

## 2022-02-01 ENCOUNTER — Encounter (HOSPITAL_BASED_OUTPATIENT_CLINIC_OR_DEPARTMENT_OTHER): Payer: Self-pay | Admitting: Podiatry

## 2022-02-01 DIAGNOSIS — Z20822 Contact with and (suspected) exposure to covid-19: Secondary | ICD-10-CM | POA: Diagnosis not present

## 2022-02-01 NOTE — H&P (Signed)
?  Encounter details ?ENCOUNTER TYPE ?[id number] (SNOMED-CT) Office Visit ?NOTE TYPE ?SOAP Note ?DATE ?12/12/2021 ? ?AGE AT 89 ?66 yrs ? ?SEEN BY ?Jed Limerick DPM ? ?FACILITY ?[facility] ? ?STATUS ?Electronically signed by Jed Limerick DPM at 12/12/2021 10:35 am ? ?SELF-PAY RESTRICTION ? ?No restriction ?Chief complaint ?painful lesions (Appt time: 12/12/2021 10:00:00 AM) (Arrival time: 9:49 AM) ?Flowsheets  Add [place] Customary  Show growth charts ?Diabetes Care  Add column  ? ?Last 5 encounters or labs ? Print Edit Refresh Minimize ?Hemoglobin A1c (HbA1c) ?Diabetic Foot Check ?Vitals  Add column  ? ?Last 5 encounters or labs ? Print Edit Refresh Minimize ?Vitals ?Height ?Weight ?BMI ?BMI Percentile ?BP ?Pulse ?Respiratory rate ?O2 Saturation ?Pain ?12/12/21 ?10:18 AM ?66 in ?200 lb ?32.28 ?154/75 mmHg ?54 bpm ?Note ?Subjective ?Subjective note ?[name] presents with severe pain in LEFT heel, had the lesion cut out previously which indicated callus tissue and also had wart destruction lesion destruction several times without much improvement.  Now it is very painful [name] can barely walk ? ? ?Objective ?Objective note ?painful punctate nucleated lesion LEFT heel with cyst present an inclusion cyst, measures over 10 mm in diameter ? ? ?Observations ?FUNCTIONAL STATUS ?No functional status recorded ? ?COGNITIVE STATUS ?No cognitive status recorded ? ?Assessment ?Assessment note ?painful benign lesion cyst LEFT heel with tenosynovitis ? ? ?Screenings/Interventions/Assessments ?No screenings/interventions/assessments recorded ? ?Diagnoses attached to encounter ?No diagnoses attached ? ?Plan ?Plan note ?discuss surgical options decisions made for major surgery to excise the cyst at the surgical Center LEFT heel.  Discussed using a Cam walker to offload the LEFT foot in the meantime and also after surgery.  [name] wishes to proceed consent signed in the chart outlining risks versus benefits. ? ? ? ?Plan on  excision of cyst LEFT heel CPT 28041, >1.5cm ?

## 2022-02-03 DIAGNOSIS — Z20822 Contact with and (suspected) exposure to covid-19: Secondary | ICD-10-CM | POA: Diagnosis not present

## 2022-02-04 ENCOUNTER — Telehealth: Payer: Self-pay | Admitting: Family Medicine

## 2022-02-04 NOTE — Telephone Encounter (Signed)
Pt stopped by today.  She brought a form from  Dr. Cain Saupe office to be  completed and faxed over to that office. I placed the form in Dr. Ernst Bowler basket on 5/1.  ?

## 2022-02-05 DIAGNOSIS — Z0182 Encounter for allergy testing: Secondary | ICD-10-CM | POA: Diagnosis not present

## 2022-02-05 DIAGNOSIS — Z01812 Encounter for preprocedural laboratory examination: Secondary | ICD-10-CM | POA: Diagnosis not present

## 2022-02-06 DIAGNOSIS — Z20822 Contact with and (suspected) exposure to covid-19: Secondary | ICD-10-CM | POA: Diagnosis not present

## 2022-02-06 NOTE — Telephone Encounter (Signed)
See note, did she see her Cardiologist for pre-oop? ?

## 2022-02-07 NOTE — Telephone Encounter (Signed)
LVM advising pt that she would need to see her cardiologist to complete the cardiac portion of her pre-op clearance if she hasn't done so already. If she hasn't she will need to call and get clearance from them to proceed with her surgery.  ? ?Pt advised to rtn call if any questions.  ?

## 2022-02-09 ENCOUNTER — Other Ambulatory Visit: Payer: Self-pay | Admitting: Cardiology

## 2022-02-12 NOTE — Telephone Encounter (Signed)
Barbara w/[facility] called about pt's H and P asking if it could be sent. I informed her that I had reached out to the pt and LVM advising her that she would need to contact her cardiologist to complete her pre op clearance. ? ?I asked her if she had spoken to her she stated that she had last spoken with the patient last week sometime. ? ?Called pt and LVM informing her again that she would need to reach out to the cardiologist to complete her pre op clearance.   ?

## 2022-02-12 NOTE — Progress Notes (Signed)
Pt is scheduler for left foot surgery on 02-14-2022 by Dr Elby Showers [facility] .  Pt needs H&P form for her pcp, the one received from office via fax is not pt's. Noted pt was LVM by her pcp office 02-07-2022 and told she needed to call her cardiologist, Dr Stanford Breed, office to get clearance prior to her surgery so her pcp can have H&P form filled out.  Called and spoke w/ Pamala Hurry, [place] scheduler for Dr Elby Showers, via phone.  Informed her about pt not having called her cardiology office to get clearance in order for her pcp to clear her for surgery. ?

## 2022-02-13 ENCOUNTER — Telehealth: Payer: Self-pay | Admitting: Cardiology

## 2022-02-13 NOTE — Telephone Encounter (Signed)
Please contact the requesting provider's office and make them aware of Dr. Jacalyn Lefevre recommendation.  If the procedure is going to be done under local anesthesia, then the patient may proceed with a low risk procedure.  However if the procedure required general anesthesia, unfortunately we cannot assess her overall risk at this time and she need to be seen by Dr. Stanford Breed prior to cardiac clearance. ?

## 2022-02-13 NOTE — Telephone Encounter (Signed)
I also tried to reach Dr. Fay Records assistant and triage line, both went to voicemail. ?

## 2022-02-13 NOTE — Telephone Encounter (Signed)
? ? ?  Pre-operative Risk Assessment  ?  ?Patient Name: [name]  ?DOB: 1955/10/17 ?MRN: [id number]  ? ?  ? ?Request for Surgical Clearance   ? ?Procedure:   Excision of cyst on left heel ? ?Date of Surgery:  Clearance 02/14/22                              ?   ?Surgeon:  Dr Suzi Roots ?Surgeon's Group or Practice Name:   ?Phone number:  [phone] ?Fax number:  [phone] ?  ?Type of Clearance Requested:   ?- Medical  and Pharmacy ?  ?Type of Anesthesia:   not sure ?  ?Additional requests/questions:   ? ?Signed, ?Lysbeth Galas S   ?02/13/2022, 2:29 PM   ?

## 2022-02-13 NOTE — Telephone Encounter (Signed)
Patient is scheduled for a excision of the cyst off of the left heel for tomorrow.  She was last seen by Dr. Stanford Breed in December 2022.  Heart monitor performed in May 2022 demonstrated 10 and 11 beats nonsustained VT.  Echocardiogram obtained in February 2022 showed normal EF.  More recently, patient was seen by her PCP in March 2023 at which time she complained of palpitation and chest discomfort.  A repeat heart monitor was performed which demonstrated 5 episodes of ventricular tachycardia, longest lasting 21 beats.  9 episodes of SVT longest lasting 16 beats.  The VT and SVT were detected within 45 seconds of the symptomatic patient events. ? ?Talking with the patient, she has been noticing chest discomfort which she described as "waves" coming over her chest and the shortness of breath.  Symptoms can occur both at rest and with exertion but occurs more so when she exert herself. ? ?Unfortunately the surgery is tomorrow, will check with MD to see if the patient is okay to proceed with low risk procedure despite the recent symptom. ?

## 2022-02-13 NOTE — Telephone Encounter (Signed)
Pt called and stated that she has tried reaching out to Dr. Jacalyn Lefevre office but hasn't heard back. Her surgery is scheduled for tomorrow and she is unsure if she will be  ?

## 2022-02-13 NOTE — Telephone Encounter (Signed)
? ? ?  Patient Name: [name]  ?DOB: [birth year] ?MRN: [id number] ? ?Primary Cardiologist: Kirk Ruths, MD ? ?Chart reviewed as part of pre-operative protocol coverage. Given past medical history and time since last visit, based on ACC/AHA guidelines, [name] would be reasonable candidate to undergo cyst removal if it is done under local anesthesia.  Therefore if the procedure is done under local anesthesia, patient may proceed without further work-up.  According to the patient, her surgeon's office is aware that she is on aspirin and Plavix and willing to proceed with tomorrow's procedure knowing this. ? ?Unfortunately we cannot evaluate her overall risk under general anesthesia at this time, if the procedure need to be done under general anesthesia, she will need to be seen by Dr. Stanford Breed first prior to the surgery. ? ? ?Almyra Deforest, [place] ?02/13/2022, 4:03 PM ? ?

## 2022-02-13 NOTE — Telephone Encounter (Addendum)
Left message for Dr. Morrie Sheldon CMA to please call the office ASAP to confirm if using local or general anesthesia. Please see notes from Almyra Deforest, [facility] and Dr. Stanford Breed. I will send FYI to requesting office to please reply ASAP ?

## 2022-02-14 ENCOUNTER — Ambulatory Visit (HOSPITAL_BASED_OUTPATIENT_CLINIC_OR_DEPARTMENT_OTHER): Admission: RE | Admit: 2022-02-14 | Payer: Medicare Other | Source: Ambulatory Visit | Admitting: Podiatry

## 2022-02-14 ENCOUNTER — Telehealth: Payer: Self-pay

## 2022-02-14 SURGERY — EXCISION MASS
Anesthesia: Monitor Anesthesia Care | Laterality: Left

## 2022-02-14 NOTE — Telephone Encounter (Signed)
I s/w the pre op provider today Coletta Memos, FNP. I went over the fact that Dr. Stanford Breed wanted to be sure about what type of anesthesia was being used, see notes. I found in the chart this morning that it look to be that the anesthesia will be:  ?Procedure Laterality Anesthesia ?EXCISION CYST Left Heel Left Monitor Anesthesia Care ?POPLITEAL LEG BLOCK   ? ?Per Coletta Memos, FNP: ?Ok, so this anesthesia is essentially a nerve block.  It is not general anesthesia.  This should be fine.  It does not look like we need to do anything else at this time. ? ?I will fax over notes to surgeon office ?

## 2022-02-14 NOTE — Telephone Encounter (Signed)
? ?  Pre-operative Risk Assessment  ?  ?Patient Name: [name]  ?DOB: 1956/03/19 ?MRN: [id number]  ? ?  ? ?Request for Surgical Clearance   ? ?Procedure:   Needs excision left heel. ? ?Date of Surgery:  Clearance TBD                              ?   ?Surgeon:  not indicated ?Surgeon's Group or Practice Name:  [facility] ?Phone number:  [phone] ?Fax number:  [phone] ?  ?Type of Clearance Requested:   ?- Medical  ?  ?Type of Anesthesia:  Not Indicated ?  ?Additional requests/questions:   ? ?Signed, ?Kathreen Devoid   ?02/14/2022, 1:21 PM  ? ?

## 2022-02-18 ENCOUNTER — Other Ambulatory Visit: Payer: Self-pay | Admitting: Family Medicine

## 2022-02-22 ENCOUNTER — Encounter: Payer: Self-pay | Admitting: Family Medicine

## 2022-02-25 ENCOUNTER — Other Ambulatory Visit: Payer: Self-pay | Admitting: Cardiology

## 2022-02-25 DIAGNOSIS — I10 Essential (primary) hypertension: Secondary | ICD-10-CM

## 2022-03-06 NOTE — Progress Notes (Signed)
HPI:FU hypertension. Previous renal Dopplers were technically difficult. There was no stenosis on the left and the right was not well visualized. Follow up CTA to rule out renal artery stenosis was done and was negative. Last Myoview in November 2012 showed an ejection fraction of 71%, breast attenuation but no ischemia. Had CVA 2/22. CTA 2/22 showed no carotid stenosis. Monitor at [facility] 5/22 showed sinus with 10 and 11 beats NSVT. Echo 2/22 at [facility] showed normal LV function, neg saline microcavitation study, mild MR. Monitor April 2023 showed sinus rhythm with nonsustained ventricular tachycardia (longest 21 beats), PAT, PACs and PVCs.  Since last seen patient has some dyspnea on exertion.  No orthopnea, PND or pedal edema.  She occasionally has pain in her chest that is in the left breast area.  It is described as a sharp pain without radiation lasting 5 minutes.  Can occur both with exertion and at rest.  She has not had syncope.  Current Outpatient Medications  Medication Sig Dispense Refill   AMBULATORY NON FORMULARY MEDICATION Walker. Use as needed. Disp 1.  Unable gait R26.81 1 each 0   amLODipine-olmesartan (AZOR) 10-40 MG tablet Take 1 tablet by mouth every day. 30 tablet 11   ASPIRIN LOW DOSE 81 MG tablet Take 1 tablet by mouth every day 30 tablet 11   atorvastatin (LIPITOR) 80 MG tablet Take 1 tablet by mouth daily 30 tablet 11   busPIRone (BUSPAR) 10 MG tablet Take 1 tablet by mouth 3 times a day 90 tablet 11   cloNIDine (CATAPRES - DOSED IN MG/24 HR) 0.1 mg/24hr patch Place 1 patch on the skin once a week 4 patch 11   cloNIDine (CATAPRES - DOSED IN MG/24 HR) 0.3 mg/24hr patch Place 1 patch (0.3 mg total) onto the skin once a week. 12 patch 1   clopidogrel (PLAVIX) 75 MG tablet Take 1 tablet by mouth once daily 90 tablet 0   diclofenac Sodium (VOLTAREN) 1 % GEL Apply 4 grams to the skin four times a day 500 g 11   DULoxetine (CYMBALTA) 60 MG capsule Take 1 capsule by mouth  every evening 30 capsule 11   finasteride (PROPECIA) 1 MG tablet Take by mouth.     furosemide (LASIX) 40 MG tablet Take 1 tablet by mouth twice daily 60 tablet 11   gabapentin (NEURONTIN) 800 MG tablet Take 1 tablet by mouth 3 times a day 90 tablet 11   hydrALAZINE (APRESOLINE) 100 MG tablet Take 1 tablet (100 mg total) by mouth 3 (three) times daily. LAST REFILL. OVERDUE FOR A VISIT. 180 tablet 1   labetalol (NORMODYNE) 300 MG tablet Take 2 tablets by mouth twice daily 120 tablet 11   losartan (COZAAR) 100 MG tablet Take 1 tablet by mouth once daily 90 tablet 1   meloxicam (MOBIC) 15 MG tablet Take 1 tablet (15 mg total) by mouth daily. 30 tablet 0   MINOXIDIL, TOPICAL, 5 % SOLN Apply 1 application topically daily. 120 mL PRN   Multiple Vitamins-Minerals (MULTIVITAMINS THER. W/MINERALS) TABS Take 1 tablet by mouth daily.     spironolactone (ALDACTONE) 50 MG tablet Take 1 tablet by mouth every day 30 tablet 11   traZODone (DESYREL) 50 MG tablet Take 50 mg by mouth at bedtime.     VENTOLIN HFA 108 (90 BASE) MCG/ACT inhaler prn     vitamin B-12 (CYANOCOBALAMIN) 500 MCG tablet Take by mouth.     amitriptyline (ELAVIL) 10 MG tablet Take by  mouth.     esomeprazole (NEXIUM) 40 MG capsule Take by mouth.     lidocaine (XYLOCAINE) 5 % ointment Apply topically.     No current facility-administered medications for this visit.     Past Medical History:  Diagnosis Date   Allergy    Anemia    Anxiety    Bipolar disorder (HCC)    CHF (congestive heart failure) (HCC)    Chronic diastolic heart failure ([place])    hospitalized at [facility]   Closed nondisplaced fracture of phalanx of toe of left foot 06/14/3531   Complication of anesthesia    had "breathing problems and ended up in ICU" 03/2010 report on chart   COPD (chronic obstructive pulmonary disease) ([place])    DDD (degenerative disc disease), lumbar    Depression    Diabetes in pregnancy    diet cnontrolled   GERD (gastroesophageal reflux  disease)    Glucose intolerance (impaired glucose tolerance)    HLD (hyperlipidemia)    nec/nos   HTN (hypertension)    benign essential   MVP (mitral valve prolapse)    Narcolepsy    Stroke ([place])    Suicide attempt ([place])    hx   Syncope    admx to [facility] in 2/12: Echo with normal LVF; head CT unremarkable, ECG stable, no further w/u   Ulcer     Past Surgical History:  Procedure Laterality Date   AUGMENTATION MAMMAPLASTY  2003   BLADDER SUSPENSION  2002   DENTAL SURGERY  2016   Teeth implants x 4   GASTRIC BYPASS  2014   at [place] MICRODISCECTOMY  09/11/2011   Procedure: LUMBAR LAMINECTOMY/DECOMPRESSION MICRODISCECTOMY;  Surgeon: Tobi Bastos;  Location: [facility];  Service: Orthopedics;  Laterality: Right;  Hemi Laminectomy/Microdiscectomy L5 - S1 on the Right (X-Ray)   PARTIAL HYSTERECTOMY  1979   REPAIR PERONEAL TENDONS ANKLE Left 2008   after injury     Social History   Socioeconomic History   Marital status: Married    Spouse name: Shanise Balch   Number of children: 2   Years of education: 13   Highest education level: Some college, no degree  Occupational History   Occupation: legally disabled  Tobacco Use   Smoking status: Former    Types: Cigarettes    Quit date: 10/08/2003    Years since quitting: 18.4   Smokeless tobacco: Never  Vaping Use   Vaping Use: Never used  Substance and Sexual Activity   Alcohol use: Yes    Comment: rare   Drug use: No   Sexual activity: Not on file  Other Topics Concern   Not on file  Social History Narrative   Lives with her spouse and her son. She has two children. She enjoys watching television. She is not able to do a lot of since her stroke in January, 2022.   Social Determinants of Health   Financial Resource Strain: Low Risk    Difficulty of Paying Living Expenses: Not hard at all  Food Insecurity: No Food Insecurity   Worried  About Charity fundraiser in the Last Year: Never true   [place] in the Last Year: Never true  Transportation Needs: No Transportation Needs   Lack of Transportation (Medical): No   Lack of Transportation (Non-Medical): No  Physical Activity: Inactive   Days of Exercise per Week: 0 days  Minutes of Exercise per Session: 0 min  Stress: No Stress Concern Present   Feeling of Stress : Not at all  Social Connections: Moderately Isolated   Frequency of Communication with Friends and Family: More than three times a week   Frequency of Social Gatherings with Friends and Family: Once a week   Attends Religious Services: Never   Marine scientist or Organizations: No   Attends Music therapist: Never   Marital Status: Married  Human resources officer Violence: Not At Risk   Fear of Current or Ex-Partner: No   Emotionally Abused: No   Physically Abused: No   Sexually Abused: No    Family History  Problem Relation Age of Onset   Heart attack Father 63   Hypertension Father    Diabetes Mother    Hypertension Mother    Stroke Other        grandmother   Colon cancer Neg Hx    Esophageal cancer Neg Hx    Stomach cancer Neg Hx    Rectal cancer Neg Hx     ROS: Bilateral foot pain but no fevers or chills, productive cough, hemoptysis, dysphasia, odynophagia, melena, hematochezia, dysuria, hematuria, rash, seizure activity, orthopnea, PND, pedal edema, claudication. Remaining systems are negative.  Physical Exam: Well-developed well-nourished in no acute distress.  Skin is warm and dry.  HEENT is normal.  Neck is supple.  Chest is clear to auscultation with normal expansion.  Cardiovascular exam is regular rate and rhythm.  Abdominal exam nontender or distended. No masses palpated. Extremities show no edema. neuro grossly intact  ECG-normal sinus rhythm at a rate of 67, normal axis, inferolateral T wave inversion.  Left atrial enlargement.  Personally  reviewed  A/P  1 nonsustained VT-noted on recent monitor.  We will continue beta-blocker.  Note LV function normal.  2 history of palpitations-continue beta-blocker.  3 hypertension-blood pressure controlled.  She is on both Azor and losartan.  Discontinue losartan and follow blood pressure.  Check potassium and renal function.  4 prior CVA-patient did not have atrial fibrillation noted on follow-up monitor.  We will continue aspirin and Plavix.  5 coronary artery disease-this is based on prior CT demonstrating coronary calcification.  We will continue medical therapy with aspirin and statin.  6 hyperlipidemia-continue statin.  7 chest pain-she has occasional atypical chest pain.  We will arrange cardiac CTA to rule out obstructive coronary disease.  Note her electrocardiogram shows no significant changes.  Kirk Ruths, MD

## 2022-03-09 ENCOUNTER — Other Ambulatory Visit: Payer: Self-pay | Admitting: Cardiology

## 2022-03-13 ENCOUNTER — Encounter: Payer: Self-pay | Admitting: Cardiology

## 2022-03-13 ENCOUNTER — Ambulatory Visit (INDEPENDENT_AMBULATORY_CARE_PROVIDER_SITE_OTHER): Payer: Medicare Other | Admitting: Cardiology

## 2022-03-13 VITALS — BP 110/70 | HR 67 | Ht 66.0 in | Wt 201.0 lb

## 2022-03-13 DIAGNOSIS — I10 Essential (primary) hypertension: Secondary | ICD-10-CM

## 2022-03-13 DIAGNOSIS — R002 Palpitations: Secondary | ICD-10-CM | POA: Diagnosis not present

## 2022-03-13 DIAGNOSIS — R072 Precordial pain: Secondary | ICD-10-CM

## 2022-03-13 MED ORDER — METOPROLOL TARTRATE 50 MG PO TABS: ORAL_TABLET | ORAL | 0 refills | Status: DC

## 2022-03-13 MED ORDER — HYDRALAZINE HCL 100 MG PO TABS: 100.0000 mg | ORAL_TABLET | Freq: Three times a day (TID) | ORAL | 11 refills | Status: DC

## 2022-03-13 NOTE — Patient Instructions (Signed)
Medication Instructions:   STOP LOSARTAN  *If you need a refill on your cardiac medications before your next appointment, please call your pharmacy*   Testing/Procedures:   Your cardiac CT will be scheduled at   [facility] [place], [place] [address] [phone]   If scheduled at [facility], please arrive at the [facility] and Children's Entrance (Entrance C2) of [facility] 30 minutes prior to test start time. You can use the FREE valet parking offered at entrance C (encouraged to control the heart rate for the test)  Proceed to the [facility] Radiology Department (first floor) to check-in and test prep.  All radiology patients and guests should use entrance C2 at [facility], accessed from [facility], even though the hospital's physical address listed is [address]      Please follow these instructions carefully (unless otherwise directed):    On the Night Before the Test: Be sure to Drink plenty of water. Do not consume any caffeinated/decaffeinated beverages or chocolate 12 hours prior to your test. Do not take any antihistamines 12 hours prior to your test.   On the Day of the Test: Drink plenty of water until 1 hour prior to the test. Do not eat any food 4 hours prior to the test. You may take your regular medications prior to the test.  Take metoprolol (Lopressor) 50 MG two hours prior to test. HOLD Furosemide morning of the test. FEMALES- please wear underwire-free bra if available, avoid dresses & tight clothing   After the Test: Drink plenty of water. After receiving IV contrast, you may experience a mild flushed feeling. This is normal. On occasion, you may experience a mild rash up to 24 hours after the test. This is not dangerous. If this occurs, you can take Benadryl 25 mg and increase your fluid intake. If you experience trouble breathing, this can be serious. If it is  severe call 911 IMMEDIATELY. If it is mild, please call our office.   We will call to schedule your test 2-4 weeks out understanding that some insurance companies will need an authorization prior to the service being performed.   For non-scheduling related questions, please contact the cardiac imaging nurse navigator should you have any questions/concerns: Marchia Bond, Cardiac Imaging Nurse Navigator Gordy Clement, Cardiac Imaging Nurse Navigator  Heart and Vascular Services Direct Office Dial: [phone]   For scheduling needs, including cancellations and rescheduling, please call [place], [phone].    Follow-Up: At [facility], you and your health needs are our priority.  As part of our continuing mission to provide you with exceptional heart care, we have created designated Provider Care Teams.  These Care Teams include your primary Cardiologist (physician) and Advanced Practice Providers (APPs -  Physician Assistants and Nurse Practitioners) who all work together to provide you with the care you need, when you need it.  We recommend signing up for the patient portal called "MyChart".  Sign up information is provided on this After Visit Summary.  MyChart is used to connect with patients for Virtual Visits (Telemedicine).  Patients are able to view lab/test results, encounter notes, upcoming appointments, etc.  Non-urgent messages can be sent to your provider as well.   To learn more about what you can do with MyChart, go to [email or web address].    Your next appointment:   6 month(s)  The format for your next appointment:   In Person  Provider:  Kirk Ruths, MD      Important Information About Sugar

## 2022-03-15 NOTE — Addendum Note (Signed)
Addended by: Cristopher Estimable on: 03/15/2022 08:41 AM   Modules accepted: Orders

## 2022-03-18 ENCOUNTER — Telehealth: Payer: Medicare Other

## 2022-03-18 ENCOUNTER — Telehealth: Payer: Self-pay | Admitting: Pharmacist

## 2022-03-18 NOTE — Progress Notes (Incomplete)
Chronic Care Management Pharmacy Note  03/18/2022 Name:  [name] MRN:  [id number] DOB:  [birth year]  Summary: addressed HTN, HLD. During medication reconciliation, it was discovered patient is taking duplicate of:  - ARBs (olmesartan + losartan)  Patient states blood pressure at home runs ~ SBP 150-160 if stressed or busy, and occasionally as low as SBP 130s.   Recommendations/Changes made from today's visit:  - Recommend stop amlodipine/olmesartan 10-'40mg'$  combination - Recommend send RX for solely amlodipine '10mg'$  daily - Recommend increase clonidine patch to 0.'3mg'$  weekly  Plan: f/u with pharmacist in 4-5 months   Subjective: [name] is an 66 y.o. year old female who is a primary patient of [name], Rene Kocher, MD.  The CCM team was consulted for assistance with disease management and care coordination needs.    Engaged with patient by telephone for follow up visit in response to provider referral for pharmacy case management and/or care coordination services.   Consent to Services:  The patient was given information about Chronic Care Management services, agreed to services, and gave verbal consent prior to initiation of services.  Please see initial visit note for detailed documentation.   Patient Care Team: Hali Marry, MD as PCP - General Stanford Breed Denice Bors, MD as PCP - Cardiology (Cardiology) [name], [facility] as Pharmacist (Pharmacist) Lelon Perla, MD as Consulting Physician (Cardiology) Lelon Perla, MD as Consulting Physician (Cardiology)  Recent office visits:  01/23/21 Beatrice Lecher MD - Seen for follow up hypertension.   12/07/20 Beatrice Lecher, MD - Seen for hospital follow up. Xrays done. Return in 6 weeks.    Recent consult visits:  03/21/21 Kirk Ruths MD Cardiology - Seen for follow up hypertension.Labs ordered. Follow up in 6 months.   03/19/21 Cammy Brochure Neurology - Seen for history of stoke. Follow up in 4  months. Return in 3 months.   01/10/21 [name], MD Sports Medicine - Seen for left ankle pain. No notes available.   12/14/20 Cammy Brochure Neurology - Seen for history of stroke. Follow up in 3 months.   03/03/2 [name], MD Sports medicine - Seen for bilateral chronic knee pain. Xrays done. Prescription for walker given. Return in 2 weeks.  Hospital visits:  Admitted to the hospital on 11/21/20 due to Hypertension. Discharge date was 11/25/20. Discharged from [facility].     Medication Changes at [facility] Discharge:?? -started plavix 75 mg due to stroke, stopped clonidine 0.'2mg'$ , no other changes  Objective:  Lab Results  Component Value Date   CREATININE 0.81 09/24/2021   CREATININE 0.82 03/28/2021   CREATININE 0.93 12/03/2019    Lab Results  Component Value Date   HGBA1C 5.6 04/14/2017       Component Value Date/Time   CHOL 132 03/28/2021 0841   TRIG 54 03/28/2021 0841   HDL 52 03/28/2021 0841   CHOLHDL 2.5 03/28/2021 0841   VLDL 19 04/14/2017 0959   LDLCALC 67 03/28/2021 0841       Latest Ref Rng & Units 03/28/2021    8:41 AM 12/03/2019    9:06 AM 02/11/2018   11:33 AM  Hepatic Function  Total Protein 6.1 - 8.1 g/dL 6.7  6.0  7.3   AST 10 - 35 U/L '17  14  17   '$ ALT 6 - 29 U/L '14  9  11   '$ Total Bilirubin 0.2 - 1.2 mg/dL 0.7  0.7  0.6     Lab Results  Component  Value Date/Time   TSH 0.75 04/14/2017 09:59 AM   TSH 0.676 12/21/2014 10:59 AM       Latest Ref Rng & Units 02/11/2018   11:33 AM 04/14/2017    9:59 AM 12/21/2014   10:59 AM  CBC  WBC 3.8 - 10.8 Thousand/uL 8.9  7.1  8.3   Hemoglobin 11.7 - 15.5 g/dL 14.0  12.4  14.6   Hematocrit 35.0 - 45.0 % 41.5  39.2  43.6   Platelets 140 - 400 Thousand/uL 254  228  218      Social History   Tobacco Use  Smoking Status Former   Types: Cigarettes   Quit date: 10/08/2003   Years since quitting: 18.4  Smokeless Tobacco Never   BP Readings from Last 3 Encounters:  03/13/22 110/70  12/28/21  (!) 107/54  11/07/21 (!) 142/89   Pulse Readings from Last 3 Encounters:  03/13/22 67  12/28/21 72  11/07/21 71   Wt Readings from Last 3 Encounters:  03/13/22 201 lb (91.2 kg)  12/28/21 203 lb (92.1 kg)  09/19/21 203 lb 12.8 oz (92.4 kg)    Assessment: Review of patient past medical history, allergies, medications, health status, including review of consultants reports, laboratory and other test data, was performed as part of comprehensive evaluation and provision of chronic care management services.   SDOH:  (Social Determinants of Health) assessments and interventions performed:    CCM Care Plan  Allergies  Allergen Reactions   Codeine Nausea And Vomiting and Nausea Only    Medications Reviewed Today     Reviewed by Lelon Perla, MD (Physician) on 03/13/22 at [facility] [place] List Status: <None>   Medication Order Taking? Sig Documenting Provider Last Dose Status Informant  AMBULATORY NON FORMULARY MEDICATION [id number] Yes Walker. Use as needed. Disp 1.  Unable gait R26.81 [name], MD Taking Active   amitriptyline (ELAVIL) 10 MG tablet [id number]  Take by mouth. [provider]  Active   amLODipine-olmesartan (AZOR) 10-40 MG tablet [id number] Yes Take 1 tablet by mouth every day. Lelon Perla, MD Taking Active   ASPIRIN LOW DOSE 81 MG tablet [id number] Yes Take 1 tablet by mouth every day Lelon Perla, MD Taking Active   atorvastatin (LIPITOR) 80 MG tablet [id number] Yes Take 1 tablet by mouth daily Crenshaw, Denice Bors, MD Taking Active   busPIRone (BUSPAR) 10 MG tablet [id number] Yes Take 1 tablet by mouth 3 times a day Hali Marry, MD Taking Active   cloNIDine (CATAPRES - DOSED IN MG/24 HR) 0.1 mg/24hr patch [id number] Yes Place 1 patch on the skin once a week Lelon Perla, MD Taking Active   cloNIDine (CATAPRES - DOSED IN MG/24 HR) 0.3 mg/24hr patch [id number] Yes Place 1 patch (0.3 mg total) onto the skin once a week. Hali Marry,  MD Taking Active   clopidogrel (PLAVIX) 75 MG tablet [id number] Yes Take 1 tablet by mouth once daily Hali Marry, MD Taking Active   diclofenac Sodium (VOLTAREN) 1 % GEL [id number] Yes Apply 4 grams to the skin four times a day Hali Marry, MD Taking Active   DULoxetine (CYMBALTA) 60 MG capsule [id number] Yes Take 1 capsule by mouth every evening Hali Marry, MD Taking Active   esomeprazole (NEXIUM) 40 MG capsule [id number]  Take by mouth. [provider]  Active   finasteride (PROPECIA) 1 MG tablet [id number] Yes Take by mouth. [provider]  Active   furosemide (LASIX) 40  MG tablet [id number] Yes Take 1 tablet by mouth twice daily Lelon Perla, MD Taking Active   gabapentin (NEURONTIN) 800 MG tablet [id number] Yes Take 1 tablet by mouth 3 times a day Hali Marry, MD Taking Active   hydrALAZINE (APRESOLINE) 100 MG tablet [id number] Yes Take 1 tablet (100 mg total) by mouth 3 (three) times daily. LAST REFILL. OVERDUE FOR A VISIT. Hali Marry, MD Taking Active   labetalol (NORMODYNE) 300 MG tablet [id number] Yes Take 2 tablets by mouth twice daily Lelon Perla, MD Taking Active   lidocaine (XYLOCAINE) 5 % ointment [id number]  Apply topically. [provider]  Active   losartan (COZAAR) 100 MG tablet [id number] Yes Take 1 tablet by mouth once daily Hali Marry, MD Taking Active   meloxicam (MOBIC) 15 MG tablet [id number] Yes Take 1 tablet (15 mg total) by mouth daily. Lorenda Peck, DPM Taking Active   MINOXIDIL, TOPICAL, 5 % SOLN [id number] Yes Apply 1 application topically daily. Hali Marry, MD Taking Active   Multiple Vitamins-Minerals (MULTIVITAMINS THER. W/MINERALS) Sheral Flow [id number] Yes Take 1 tablet by mouth daily. [provider] Taking Active   spironolactone (ALDACTONE) 50 MG tablet [id number] Yes Take 1 tablet by mouth every day Lelon Perla, MD Taking Active   traZODone (DESYREL) 50  MG tablet [id number] Yes Take 50 mg by mouth at bedtime. [provider] Taking Active   VENTOLIN HFA 108 (90 BASE) MCG/ACT inhaler [id number] Yes prn [provider] Taking Active   vitamin B-12 (CYANOCOBALAMIN) 500 MCG tablet [id number] Yes Take by mouth. 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   Narcolepsy 03/13/2013   Coronary atherosclerosis 03/13/2013   DDD (degenerative disc disease), lumbar    HIRSUTISM 06/02/2009   BIPOLAR DISORDER UNSPECIFIED 05/17/2009   Hyperlipidemia 07/15/2007    Immunization History  Administered Date(s) Administered   Influenza Split 06/27/2011   Influenza Whole 07/15/2007   Influenza,inj,Quad PF,6+ Mos 07/31/2016   Janssen (J&J) SARS-COV-2 Vaccination 02/04/2020   PFIZER(Purple Top)SARS-COV-2 Vaccination 11/07/2020   Tdap 12/21/2014    Conditions to be addressed/monitored: HTN and HLD  There are no care plans that you recently modified to display for this patient.      Medication Assistance: None required.  Patient affirms current coverage meets needs.  Patient's preferred pharmacy is:  [place], [place] [address] Phone: [phone] Fax: [place], [place] [address] [place] [address] Phone: [phone]  Fax: [phone]   Uses pill box? No - keeps in original bottles & states works well for her. Keeps patch exchange days written on the calendar Pt endorses 100% compliance  Follow Up:  Patient agrees to Care Plan and Follow-up.  Plan: Telephone follow up appointment with care management team member  scheduled for:  1 month  Larinda Buttery, PharmD Clinical Pharmacist [facility] [phone]

## 2022-03-18 NOTE — Telephone Encounter (Signed)
Unsuccessful attempt x2 to contact patient for follow-up phone call for CCM services with pharmacist on 03/18/22.  Will route to schedule team for reschedule.  Larinda Buttery, PharmD Clinical Pharmacist [facility] [phone]

## 2022-03-19 NOTE — Chronic Care Management (AMB) (Unsigned)
  Chronic Care Management Note  03/19/2022 Name: [name] MRN: [id number] DOB: 11/18/1955  [name] is a 66 y.o. year old female who is a primary care patient of Hali Marry, MD and is actively engaged with the care management team. I reached out to [name] by phone today to assist with re-scheduling a follow up visit with the Pharmacist  Follow up plan: Unsuccessful telephone outreach attempt made. A HIPAA compliant phone message was left for the patient providing contact information and requesting a return call.   Julian Hy, [facility] Management  Direct Dial: [phone]

## 2022-03-20 ENCOUNTER — Other Ambulatory Visit: Payer: Self-pay | Admitting: Family Medicine

## 2022-03-21 DIAGNOSIS — I110 Hypertensive heart disease with heart failure: Secondary | ICD-10-CM | POA: Diagnosis not present

## 2022-03-21 DIAGNOSIS — M79672 Pain in left foot: Secondary | ICD-10-CM | POA: Diagnosis not present

## 2022-03-21 DIAGNOSIS — I509 Heart failure, unspecified: Secondary | ICD-10-CM | POA: Diagnosis not present

## 2022-03-21 DIAGNOSIS — Z79899 Other long term (current) drug therapy: Secondary | ICD-10-CM | POA: Diagnosis not present

## 2022-03-21 DIAGNOSIS — Z888 Allergy status to other drugs, medicaments and biological substances status: Secondary | ICD-10-CM | POA: Diagnosis not present

## 2022-03-21 DIAGNOSIS — M79671 Pain in right foot: Secondary | ICD-10-CM | POA: Diagnosis not present

## 2022-03-21 DIAGNOSIS — K219 Gastro-esophageal reflux disease without esophagitis: Secondary | ICD-10-CM | POA: Diagnosis not present

## 2022-03-21 DIAGNOSIS — Z87891 Personal history of nicotine dependence: Secondary | ICD-10-CM | POA: Diagnosis not present

## 2022-04-01 DIAGNOSIS — I10 Essential (primary) hypertension: Secondary | ICD-10-CM | POA: Diagnosis not present

## 2022-04-02 LAB — BASIC METABOLIC PANEL
BUN: 14 mg/dL (ref 7–25)
CO2: 28 mmol/L (ref 20–32)
Calcium: 9.5 mg/dL (ref 8.6–10.4)
Chloride: 105 mmol/L (ref 98–110)
Creat: 0.91 mg/dL (ref 0.50–1.05)
Glucose, Bld: 91 mg/dL (ref 65–99)
Potassium: 3.7 mmol/L (ref 3.5–5.3)
Sodium: 141 mmol/L (ref 135–146)

## 2022-04-03 ENCOUNTER — Encounter: Payer: Self-pay | Admitting: *Deleted

## 2022-04-03 ENCOUNTER — Telehealth (HOSPITAL_COMMUNITY): Payer: Self-pay | Admitting: *Deleted

## 2022-04-03 NOTE — Telephone Encounter (Signed)
Reaching out to patient to offer assistance regarding upcoming cardiac imaging study; pt verbalizes understanding of appt date/time, parking situation and where to check in, pre-test NPO status and medications ordered, and verified current allergies; name and call back number provided for further questions should they arise  Gordy Clement RN Navigator Cardiac Imaging [facility] [phone] office [phone] cell   Patient to take '50mg'$  metoprolol tartrate two hours prior to her cardiac CT. She is aware to arrive at 11:30am.

## 2022-04-03 NOTE — Telephone Encounter (Signed)
Attempted to call patient regarding upcoming cardiac CT appointment. °Left message on voicemail with name and callback number ° °Theresa Lautner RN Navigator Cardiac Imaging °[facility] °[phone] Office °[phone] Cell ° °

## 2022-04-04 ENCOUNTER — Ambulatory Visit (HOSPITAL_COMMUNITY)
Admission: RE | Admit: 2022-04-04 | Discharge: 2022-04-04 | Disposition: A | Discharge status: Home / Self Care | Payer: Medicare Other | Source: Ambulatory Visit | Attending: Cardiology | Admitting: Cardiology

## 2022-04-04 DIAGNOSIS — R072 Precordial pain: Secondary | ICD-10-CM | POA: Diagnosis not present

## 2022-04-04 MED ORDER — NITROGLYCERIN 0.4 MG SL SUBL
0.8000 mg | SUBLINGUAL_TABLET | Freq: Once | SUBLINGUAL | Status: AC
  Administered 2022-04-04: 0.8 mg via SUBLINGUAL

## 2022-04-04 MED ORDER — NITROGLYCERIN 0.4 MG SL SUBL
SUBLINGUAL_TABLET | SUBLINGUAL | Status: AC
  Filled 2022-04-04: qty 2

## 2022-04-04 MED ORDER — IOHEXOL 350 MG/ML SOLN
100.0000 mL | Freq: Once | INTRAVENOUS | Status: AC | PRN
  Administered 2022-04-04: 100 mL via INTRAVENOUS

## 2022-04-05 ENCOUNTER — Telehealth: Payer: Self-pay | Admitting: *Deleted

## 2022-04-05 ENCOUNTER — Telehealth: Payer: Medicare Other

## 2022-04-05 NOTE — Chronic Care Management (AMB) (Signed)
  Chronic Care Management Note  04/05/2022 Name: [name] MRN: [id number] DOB: October 13, 1955  [name] is a 66 y.o. year old female who is a primary care patient of Hali Marry, MD and is actively engaged with the care management team. I reached out to [name] by phone today to assist with re-scheduling a follow up visit with the Pharmacist  Follow up plan: Unsuccessful telephone outreach attempt made. A HIPAA compliant phone message was left for the patient providing contact information and requesting a return call.   Julian Hy, [facility]  Direct Dial: [phone]

## 2022-04-12 ENCOUNTER — Other Ambulatory Visit: Payer: Self-pay | Admitting: Family Medicine

## 2022-04-14 ENCOUNTER — Other Ambulatory Visit: Payer: Self-pay | Admitting: Family Medicine

## 2022-04-23 ENCOUNTER — Other Ambulatory Visit: Payer: Self-pay | Admitting: Cardiology

## 2022-05-01 NOTE — Progress Notes (Unsigned)
   I, Wendy Poet, LAT, ATC, am serving as scribe for Dr. [name].  [name] is a 66 y.o. female who presents to [place] at [facility] today for R/L heel pain and R knee pain.  She was last seen by Dr. Georgina Snell on 12/21/20 for f/u of chronic B knee pain and L 4th and 5th toe fractures.  She has a hx brainstem infarct and has significant gait difficulty with vertigo and ataxia. Today, pt reports  Heel pain: -Swelling: -Aggravating factors: -Treatments tried:  R knee pain: -Swelling: -Mechanical symptoms: -Aggravating factors: -Treatments tried:  Diagnostic testing: R and L knee XR- 12/07/20; L foot and ankle XR- 12/21/20  Pertinent review of systems: ***  Relevant historical information: ***   Exam:  There were no vitals taken for this visit. General: Well Developed, well nourished, and in no acute distress.   MSK: ***    Lab and Radiology Results No results found for this or any previous visit (from the past 72 hour(s)). No results found.     Assessment and Plan: 67 y.o. female with ***   PDMP not reviewed this encounter. No orders of the defined types were placed in this encounter.  No orders of the defined types were placed in this encounter.    Discussed warning signs or symptoms. Please see discharge instructions. Patient expresses understanding.   ***

## 2022-05-02 ENCOUNTER — Encounter: Payer: Self-pay | Admitting: Family Medicine

## 2022-05-02 ENCOUNTER — Ambulatory Visit: Payer: Self-pay

## 2022-05-02 ENCOUNTER — Ambulatory Visit (INDEPENDENT_AMBULATORY_CARE_PROVIDER_SITE_OTHER): Payer: Medicare Other | Admitting: Family Medicine

## 2022-05-02 VITALS — BP 110/78 | HR 72 | Ht 66.0 in | Wt 180.8 lb

## 2022-05-02 DIAGNOSIS — M25561 Pain in right knee: Secondary | ICD-10-CM | POA: Diagnosis not present

## 2022-05-02 DIAGNOSIS — G8929 Other chronic pain: Secondary | ICD-10-CM

## 2022-05-02 DIAGNOSIS — M79671 Pain in right foot: Secondary | ICD-10-CM

## 2022-05-02 DIAGNOSIS — B070 Plantar wart: Secondary | ICD-10-CM | POA: Diagnosis not present

## 2022-05-02 DIAGNOSIS — M7661 Achilles tendinitis, right leg: Secondary | ICD-10-CM | POA: Diagnosis not present

## 2022-05-02 NOTE — Patient Instructions (Addendum)
Good to see you today.  You had a R knee injection.  Call or go to the ER if you develop a large red swollen joint with extreme pain or oozing puss.   Please get a short walking boot at [facility].  Their address is [address]  [phone].  I've referred you to Physical Therapy.  Their office will call you to schedule but please let us know if you don't hear from them in one week regarding scheduling.  Follow-up: one month

## 2022-05-03 ENCOUNTER — Other Ambulatory Visit: Payer: Self-pay | Admitting: Family Medicine

## 2022-05-09 ENCOUNTER — Ambulatory Visit: Payer: Medicare Other | Attending: Family Medicine | Admitting: Physical Therapy

## 2022-05-09 ENCOUNTER — Encounter: Payer: Self-pay | Admitting: Physical Therapy

## 2022-05-09 DIAGNOSIS — R269 Unspecified abnormalities of gait and mobility: Secondary | ICD-10-CM | POA: Diagnosis not present

## 2022-05-09 DIAGNOSIS — Z8673 Personal history of transient ischemic attack (TIA), and cerebral infarction without residual deficits: Secondary | ICD-10-CM | POA: Insufficient documentation

## 2022-05-09 DIAGNOSIS — M79671 Pain in right foot: Secondary | ICD-10-CM | POA: Diagnosis not present

## 2022-05-09 DIAGNOSIS — R262 Difficulty in walking, not elsewhere classified: Secondary | ICD-10-CM | POA: Insufficient documentation

## 2022-05-09 DIAGNOSIS — M62838 Other muscle spasm: Secondary | ICD-10-CM | POA: Diagnosis not present

## 2022-05-09 DIAGNOSIS — G8929 Other chronic pain: Secondary | ICD-10-CM | POA: Insufficient documentation

## 2022-05-09 DIAGNOSIS — M7661 Achilles tendinitis, right leg: Secondary | ICD-10-CM | POA: Insufficient documentation

## 2022-05-09 DIAGNOSIS — M25561 Pain in right knee: Secondary | ICD-10-CM

## 2022-05-09 NOTE — Therapy (Signed)
OUTPATIENT PHYSICAL THERAPY LOWER EXTREMITY EVALUATION   Patient Name: [name] MRN: [id number] DOB:01/01/1956, 66 y.o., female Today's Date: 05/09/2022   PT End of Session - 05/09/22 1320     Visit Number 1    Number of Visits 12    Date for PT Re-Evaluation 06/20/22    Authorization Type Medicare    PT Start Time 1319    PT Stop Time 1400    PT Time Calculation (min) 41 min    Activity Tolerance Patient tolerated treatment well    Behavior During Therapy WFL for tasks assessed/performed             Past Medical History:  Diagnosis Date   Allergy    Anemia    Anxiety    Bipolar disorder ([place])    CHF (congestive heart failure) ([place])    Chronic diastolic heart failure ([place])    hospitalized at [facility] regional   Closed nondisplaced fracture of phalanx of toe of left foot 06/12/7892   Complication of anesthesia    had "breathing problems and ended up in ICU" 03/2010 report on chart   COPD (chronic obstructive pulmonary disease) ([place])    DDD (degenerative disc disease), lumbar    Depression    Diabetes in pregnancy    diet cnontrolled   GERD (gastroesophageal reflux disease)    Glucose intolerance (impaired glucose tolerance)    HLD (hyperlipidemia)    nec/nos   HTN (hypertension)    benign essential   MVP (mitral valve prolapse)    Narcolepsy    Stroke ([place])    Suicide attempt ([place])    hx   Syncope    admx to [facility] in 2/12: Echo with normal LVF; head CT unremarkable, ECG stable, no further w/u   Ulcer    Past Surgical History:  Procedure Laterality Date   AUGMENTATION MAMMAPLASTY  2003   BLADDER SUSPENSION  2002   DENTAL SURGERY  2016   Teeth implants x 4   GASTRIC BYPASS  2014   at [place] MICRODISCECTOMY  09/11/2011   Procedure: LUMBAR LAMINECTOMY/DECOMPRESSION MICRODISCECTOMY;  Surgeon: Tobi Bastos;  Location: [facility];  Service: Orthopedics;  Laterality: Right;  Hemi  Laminectomy/Microdiscectomy L5 - S1 on the Right (X-Ray)   PARTIAL HYSTERECTOMY  1979   REPAIR PERONEAL TENDONS ANKLE Left 2008   after injury    Patient Active Problem List   Diagnosis Date Noted   Labile hypertension 12/07/2020   Unstable gait 12/07/2020   Bilateral chronic knee pain 12/07/2020   Cerebral amyloid angiopathy (CODE) 12/07/2020   Iron deficiency 05/23/2020   Post herpetic neuralgia 02/09/2019   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   Caregiver burden 02/10/2018   Mitral valve prolapse syndrome 08/15/2016   Congestive heart failure ([place]) 08/15/2016   Right cervical radiculopathy 08/09/2016   Essential hypertension, malignant 06/12/2016   History of stroke 03/13/2016   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  Rationale for Evaluation and Treatment  Rehabilitation  ONSET DATE: March 2023  SUBJECTIVE:   SUBJECTIVE STATEMENT: Pt reports developing plantar wart on L heel -- was going to get it surgically removed but cardiologist did not clear her. Pt reports favoring R LE. Pt is currently in boot which is helping. Pt states back of heel is swollen. Pt got a shot in right knee which helped somewhat but it still bothers her on the medial side.   PERTINENT HISTORY: hx brainstem infarct and has significant gait difficulty with vertigo and ataxia   PAIN:  Are you having pain? Yes: NPRS scale: 9 if touched, 8 at rest/10 Pain location: R heel to back calf Pain description: Aching,  throbbing, constant, soreness Aggravating factors: Touched, walking, putting weight on it Relieving factors: Has not been able to find anything  Are you having pain? Yes: NPRS scale: 7/10 Pain location: Medial knee Pain description: Throbbing Aggravating factors: Walking Relieving factors: has not been able to find anything besides the shot   PRECAUTIONS: None; currently wearing cam boot  WEIGHT BEARING RESTRICTIONS No  FALLS:  Has patient fallen in last 6 months? No  LIVING ENVIRONMENT: Lives with: lives with their spouse Lives in: House/apartment Stairs: No Has following equipment at home: None Cares for multiple animals  OCCUPATION: Retired  PLOF: Independent  PATIENT GOALS Improve pain and mobility   OBJECTIVE:   DIAGNOSTIC FINDINGS:  Knee x-ray: 1. Moderate medial compartment osteoarthritis bilaterally, progressed within the left knee. 2. Trace left knee joint effusion, nonspecific.  PATIENT SURVEYS:  FOTO 29; predicted 50  COGNITION:  Overall cognitive status: Within functional limits for tasks assessed     SENSATION: WFL  EDEMA:  Reports mild swelling around R knee and back of heel   POSTURE: No Significant postural limitations  PALPATION: TTP gastroc/soleus  LOWER EXTREMITY AROM:  Active ROM Right eval Left eval  Hip flexion    Hip extension    Hip abduction    Hip adduction    Hip internal rotation    Hip external rotation    Knee flexion 105   Knee extension    Ankle dorsiflexion 2   Ankle plantarflexion 33   Ankle inversion 18   Ankle eversion 20    (Blank rows = not tested)  LOWER EXTREMITY MMT:  MMT Right eval Left eval  Hip flexion 3+/5 4/5  Hip extension    Hip abduction    Hip adduction    Hip internal rotation    Hip external rotation    Knee flexion 4-/5 limited by pain 5/5  Knee extension 4-/5 limited by pain 5/5  Ankle dorsiflexion 3+/5 limited by pain   Ankle plantarflexion 3+/5 limited by pain   Ankle  inversion 4/5   Ankle eversion 4/5    (Blank rows = not tested)   FUNCTIONAL TESTS:  Sit<>stand decreased R LE weightshift, limited DF due to cam boot  GAIT: Distance walked: 25' Assistive device utilized:  Cam boot Level of assistance: Complete Independence Comments: Antalgic pattern, LE externally rotated, wide BOS    TODAY'S TREATMENT: See HEP   PATIENT EDUCATION:  Education details: Exam findings, POC Person educated: Patient Education method: Explanation and Handouts Education comprehension: verbalized understanding and needs further education   HOME EXERCISE PROGRAM: Access Code: [email or web address] URL: [email or web address] Date: 05/09/2022 Prepared by: Estill Bamberg April Thurnell Garbe  Exercises - Seated Toe Curl  - 1 x daily - 7 x weekly - 2 sets - 10 reps - Toe Spreading  - 1 x daily -  7 x weekly - 2 sets - 10 reps - Toe Yoga - Alternating Great Toe and Lesser Toe Extension  - 1 x daily - 7 x weekly - 2 sets - 10 reps - Seated Calf Stretch with Strap  - 1 x daily - 7 x weekly - 3 sets - 30 sec hold - Seated Heel Slide  - 1 x daily - 7 x weekly - 2 sets - 10 reps  ASSESSMENT:  CLINICAL IMPRESSION: Patient is a 66 y.o. F who was seen today for physical therapy evaluation and treatment for R Achilles tendonitis and R knee pain. PMH significant for prior CVA affecting R side. Pt comes in with cam boot. Assessment significant for decreased ankle AROM and strength due to pain. Notable edema on posterior calcaneus and medial R knee. Pt would benefit from PT to address these issues and return to normal mobility.   OBJECTIVE IMPAIRMENTS Abnormal gait, decreased balance, decreased mobility, difficulty walking, decreased ROM, decreased strength, increased edema, increased fascial restrictions, increased muscle spasms, and pain.   ACTIVITY LIMITATIONS lifting, bending, standing, squatting, sleeping, stairs, transfers, and locomotion level  PARTICIPATION LIMITATIONS: meal  prep, cleaning, laundry, driving, shopping, and community activity  PERSONAL FACTORS Age, Past/current experiences, and 1 comorbidity: Brainstem CVA affecting R side  are also affecting patient's functional outcome.   REHAB POTENTIAL: Good  CLINICAL DECISION MAKING: Evolving/moderate complexity  EVALUATION COMPLEXITY: Moderate   GOALS: Goals reviewed with patient? Yes  SHORT TERM GOALS: Target date: 05/30/2022  Pt will be ind with initial HEP Baseline: Goal status: INITIAL  2.  Pt will be able to tolerate ankle PROM in all directions with </=4/10 pain  Baseline:  Goal status: INITIAL    LONG TERM GOALS: Target date: 06/20/2022   Pt will be ind with advanced HEP Baseline:  Goal status: INITIAL  2.  Pt will demo at least 10 deg R ankle DF for amb and stair ascent/descent Baseline:  Goal status: INITIAL  3.  Pt will demo at least 4/5 R LE strength for improved t/fs and stability Baseline:  Goal status: INITIAL  4.  Pt will have increased FOTO score of 45 Baseline: 29 Goal status: INITIAL  5.  Pt will be able to amb at least 1000' ind with reciprocal gait pattern for improved community amb and </=2/10 pain Baseline:  Goal status: INITIAL    PLAN: PT FREQUENCY: 2x/week  PT DURATION: 6 weeks  PLANNED INTERVENTIONS: Therapeutic exercises, Therapeutic activity, Neuromuscular re-education, Balance training, Gait training, Patient/Family education, Self Care, Joint mobilization, Stair training, Orthotic/Fit training, DME instructions, Aquatic Therapy, Dry Needling, Electrical stimulation, Cryotherapy, Moist heat, Taping, Ionotophoresis '4mg'$ /ml Dexamethasone, and Manual therapy  PLAN FOR NEXT SESSION: Continue ankle A/PROM. STM and TPR of gastroc/soleus (consider TPDN). Consider taping for knee and achilles. Initiate strengthening.    Talitha Dicarlo April Ma L Jager Koska, PT 05/09/2022, 4:29 PM

## 2022-05-10 ENCOUNTER — Other Ambulatory Visit: Payer: Self-pay | Admitting: Cardiology

## 2022-05-15 ENCOUNTER — Encounter: Payer: Self-pay | Admitting: Physical Therapy

## 2022-05-15 ENCOUNTER — Ambulatory Visit: Payer: Medicare Other | Admitting: Physical Therapy

## 2022-05-15 DIAGNOSIS — R29818 Other symptoms and signs involving the nervous system: Secondary | ICD-10-CM

## 2022-05-15 DIAGNOSIS — G8929 Other chronic pain: Secondary | ICD-10-CM | POA: Diagnosis not present

## 2022-05-15 DIAGNOSIS — M6281 Muscle weakness (generalized): Secondary | ICD-10-CM

## 2022-05-15 DIAGNOSIS — R262 Difficulty in walking, not elsewhere classified: Secondary | ICD-10-CM | POA: Diagnosis not present

## 2022-05-15 DIAGNOSIS — R269 Unspecified abnormalities of gait and mobility: Secondary | ICD-10-CM | POA: Diagnosis not present

## 2022-05-15 DIAGNOSIS — R2681 Unsteadiness on feet: Secondary | ICD-10-CM

## 2022-05-15 DIAGNOSIS — M7661 Achilles tendinitis, right leg: Secondary | ICD-10-CM | POA: Diagnosis not present

## 2022-05-15 DIAGNOSIS — M62838 Other muscle spasm: Secondary | ICD-10-CM | POA: Diagnosis not present

## 2022-05-15 DIAGNOSIS — M79671 Pain in right foot: Secondary | ICD-10-CM | POA: Diagnosis not present

## 2022-05-15 DIAGNOSIS — R2689 Other abnormalities of gait and mobility: Secondary | ICD-10-CM

## 2022-05-15 DIAGNOSIS — M25561 Pain in right knee: Secondary | ICD-10-CM

## 2022-05-15 NOTE — Therapy (Signed)
OUTPATIENT PHYSICAL THERAPY LOWER EXTREMITY EVALUATION   Patient Name: [name] MRN: [id number] DOB:1956-09-25, 66 y.o., female Today's Date: 05/15/2022   PT End of Session - 05/15/22 1029     Visit Number 2    Number of Visits 12    Date for PT Re-Evaluation 06/20/22    Authorization Type Medicare    PT Start Time 1025    PT Stop Time 1100    PT Time Calculation (min) 35 min    Activity Tolerance Patient tolerated treatment well    Behavior During Therapy WFL for tasks assessed/performed             Past Medical History:  Diagnosis Date   Allergy    Anemia    Anxiety    Bipolar disorder ([place])    CHF (congestive heart failure) ([place])    Chronic diastolic heart failure ([place])    hospitalized at [facility]   Closed nondisplaced fracture of phalanx of toe of left foot 04/11/1606   Complication of anesthesia    had "breathing problems and ended up in ICU" 03/2010 report on chart   COPD (chronic obstructive pulmonary disease) ([place])    DDD (degenerative disc disease), lumbar    Depression    Diabetes in pregnancy    diet cnontrolled   GERD (gastroesophageal reflux disease)    Glucose intolerance (impaired glucose tolerance)    HLD (hyperlipidemia)    nec/nos   HTN (hypertension)    benign essential   MVP (mitral valve prolapse)    Narcolepsy    Stroke ([place])    Suicide attempt ([place])    hx   Syncope    admx to [facility] in 2/12: Echo with normal LVF; head CT unremarkable, ECG stable, no further w/u   Ulcer    Past Surgical History:  Procedure Laterality Date   AUGMENTATION MAMMAPLASTY  2003   BLADDER SUSPENSION  2002   DENTAL SURGERY  2016   Teeth implants x 4   GASTRIC BYPASS  2014   at [place] MICRODISCECTOMY  09/11/2011   Procedure: LUMBAR LAMINECTOMY/DECOMPRESSION MICRODISCECTOMY;  Surgeon: Tobi Bastos;  Location: [facility];  Service: Orthopedics;  Laterality: Right;  Hemi  Laminectomy/Microdiscectomy L5 - S1 on the Right (X-Ray)   PARTIAL HYSTERECTOMY  1979   REPAIR PERONEAL TENDONS ANKLE Left 2008   after injury    Patient Active Problem List   Diagnosis Date Noted   Labile hypertension 12/07/2020   Unstable gait 12/07/2020   Bilateral chronic knee pain 12/07/2020   Cerebral amyloid angiopathy (CODE) 12/07/2020   Iron deficiency 05/23/2020   Post herpetic neuralgia 02/09/2019   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   Caregiver burden 02/10/2018   Mitral valve prolapse syndrome 08/15/2016   Congestive heart failure ([place]) 08/15/2016   Right cervical radiculopathy 08/09/2016   Essential hypertension, malignant 06/12/2016   History of stroke 03/13/2016   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   Narcolepsy 03/13/2013   Coronary atherosclerosis 03/13/2013   DDD (degenerative disc disease), lumbar    HIRSUTISM 06/02/2009   BIPOLAR DISORDER UNSPECIFIED 05/17/2009   Hyperlipidemia 07/15/2007    PCP: Beatrice Lecher  REFERRING PROVIDER: [name]  REFERRING DIAG: [phone] (ICD-10-CM) - Recurrent pain of right knee M79.671,G89.29 (ICD-10-CM) - Chronic pain of right heel M76.61 (ICD-10-CM) - Achilles tendinitis of right lower extremity   THERAPY DIAG:  Recurrent pain of right knee  Chronic pain of  right heel  Achilles tendinitis of right lower extremity  Muscle weakness (generalized)  Other abnormalities of gait and mobility  Unsteadiness on feet  Other symptoms and signs involving the nervous system  Rationale for Evaluation and Treatment Rehabilitation  ONSET DATE: March 2023  SUBJECTIVE:   SUBJECTIVE STATEMENT: Pt states her foot and ankle is feeling a little bit better. Has been doing her exercises. However, it still remains painful. Medial knee continues to swell.   PERTINENT HISTORY: hx brainstem infarct and has significant gait difficulty with vertigo and ataxia   PAIN:  Are you having pain? Yes: NPRS scale: 9 if  touched, 8 at rest/10 Pain location: R heel to back calf Pain description: Aching, throbbing, constant, soreness Aggravating factors: Touched, walking, putting weight on it Relieving factors: Has not been able to find anything  Are you having pain? Yes: NPRS scale: 7/10 Pain location: Medial knee Pain description: Throbbing Aggravating factors: Walking Relieving factors: has not been able to find anything besides the shot   PRECAUTIONS: None; currently wearing cam boot  WEIGHT BEARING RESTRICTIONS No  FALLS:  Has patient fallen in last 6 months? No  LIVING ENVIRONMENT: Lives with: lives with their spouse Lives in: House/apartment Stairs: No Has following equipment at home: None Cares for multiple animals  OCCUPATION: Retired  PLOF: Independent  PATIENT GOALS Improve pain and mobility   OBJECTIVE:    LOWER EXTREMITY AROM:  Active ROM Right eval Left eval  Hip flexion    Hip extension    Hip abduction    Hip adduction    Hip internal rotation    Hip external rotation    Knee flexion 105   Knee extension    Ankle dorsiflexion 2   Ankle plantarflexion 33   Ankle inversion 18   Ankle eversion 20    (Blank rows = not tested)  LOWER EXTREMITY MMT:  MMT Right eval Left eval  Hip flexion 3+/5 4/5  Hip extension    Hip abduction    Hip adduction    Hip internal rotation    Hip external rotation    Knee flexion 4-/5 limited by pain 5/5  Knee extension 4-/5 limited by pain 5/5  Ankle dorsiflexion 3+/5 limited by pain   Ankle plantarflexion 3+/5 limited by pain   Ankle inversion 4/5   Ankle eversion 4/5    (Blank rows = not tested)   GAIT: Distance walked: 90' Assistive device utilized:  Cam boot Level of assistance: Complete Independence Comments: Antalgic pattern, LE externally rotated, wide BOS    TODAY'S TREATMENT: 05/15/22 Therex:  Seated  Heel slides x30 sec  Gastroc stretch x30 sec with strap  Ankle DF 2x10  Ankle inv/ev on towel  2x10    Knee ext 2x10    Toe flex into splaying 2x10    Toe yoga x10      Manual therapy:    IASTM, STM & TPR gastroc/soleus    Ktape for achilles to promote stability    Ktape for medial and lateral knee stability      PATIENT EDUCATION:  Education details: use of k-tape and skin care Person educated: Patient Education method: Explanation and Handouts Education comprehension: verbalized understanding and needs further education   HOME EXERCISE PROGRAM: Access Code: [email or web address]   ASSESSMENT:  CLINICAL IMPRESSION: Session focused on continuing foot/ankle mobility and stretching. Performed manual therapy this session to address trigger points throughout gastroc/soleus. Trial of ktape for her knee and ankle. Discussed for pt to bring in  another sheet of foam padding for her L plantar wart so we can place it in a higher shoe to make her more even when she wears the boot on the R (this appears to be a big factor of her continued medial knee pain).    OBJECTIVE IMPAIRMENTS Abnormal gait, decreased balance, decreased mobility, difficulty walking, decreased ROM, decreased strength, increased edema, increased fascial restrictions, increased muscle spasms, and pain.   ACTIVITY LIMITATIONS lifting, bending, standing, squatting, sleeping, stairs, transfers, and locomotion level  PARTICIPATION LIMITATIONS: meal prep, cleaning, laundry, driving, shopping, and community activity  PERSONAL FACTORS Age, Past/current experiences, and 1 comorbidity: Brainstem CVA affecting R side  are also affecting patient's functional outcome.   REHAB POTENTIAL: Good  CLINICAL DECISION MAKING: Evolving/moderate complexity  EVALUATION COMPLEXITY: Moderate   GOALS: Goals reviewed with patient? Yes  SHORT TERM GOALS: Target date: 05/30/2022  Pt will be ind with initial HEP Baseline: Goal status: IN PROGRESS  2.  Pt will be able to tolerate ankle PROM in all directions with </=4/10 pain  Baseline:  Goal  status: IN PROGRESS    LONG TERM GOALS: Target date: 06/20/2022   Pt will be ind with advanced HEP Baseline:  Goal status: IN PROGRESS  2.  Pt will demo at least 10 deg R ankle DF for amb and stair ascent/descent Baseline:  Goal status: IN PROGRESS  3.  Pt will demo at least 4/5 R LE strength for improved t/fs and stability Baseline:  Goal status: IN PROGRESS  4.  Pt will have increased FOTO score of 45 Baseline: 29 Goal status: IN PROGRESS  5.  Pt will be able to amb at least 1000' ind with reciprocal gait pattern for improved community amb and </=2/10 pain Baseline:  Goal status: IN PROGRESS    PLAN: PT FREQUENCY: 2x/week  PT DURATION: 6 weeks  PLANNED INTERVENTIONS: Therapeutic exercises, Therapeutic activity, Neuromuscular re-education, Balance training, Gait training, Patient/Family education, Self Care, Joint mobilization, Stair training, Orthotic/Fit training, DME instructions, Aquatic Therapy, Dry Needling, Electrical stimulation, Cryotherapy, Moist heat, Taping, Ionotophoresis '4mg'$ /ml Dexamethasone, and Manual therapy  PLAN FOR NEXT SESSION: How was taping? Did pt bring foam to put in her left shoe? Continue ankle A/PROM. STM and TPR of gastroc/soleus (consider TPDN). Initiate strengthening.    Savhanna Sliva April Ma L Welma Mccombs, PT, DPT 05/15/2022, 10:30 AM

## 2022-05-17 ENCOUNTER — Ambulatory Visit: Payer: Medicare Other | Admitting: Rehabilitative and Restorative Service Providers"

## 2022-05-17 ENCOUNTER — Encounter: Payer: Self-pay | Admitting: Rehabilitative and Restorative Service Providers"

## 2022-05-17 DIAGNOSIS — R2681 Unsteadiness on feet: Secondary | ICD-10-CM

## 2022-05-17 DIAGNOSIS — M7661 Achilles tendinitis, right leg: Secondary | ICD-10-CM

## 2022-05-17 DIAGNOSIS — M25561 Pain in right knee: Secondary | ICD-10-CM

## 2022-05-17 DIAGNOSIS — G8929 Other chronic pain: Secondary | ICD-10-CM | POA: Diagnosis not present

## 2022-05-17 DIAGNOSIS — M79671 Pain in right foot: Secondary | ICD-10-CM | POA: Diagnosis not present

## 2022-05-17 DIAGNOSIS — R2689 Other abnormalities of gait and mobility: Secondary | ICD-10-CM

## 2022-05-17 DIAGNOSIS — M62838 Other muscle spasm: Secondary | ICD-10-CM | POA: Diagnosis not present

## 2022-05-17 DIAGNOSIS — M6281 Muscle weakness (generalized): Secondary | ICD-10-CM

## 2022-05-17 DIAGNOSIS — R262 Difficulty in walking, not elsewhere classified: Secondary | ICD-10-CM | POA: Diagnosis not present

## 2022-05-17 DIAGNOSIS — R269 Unspecified abnormalities of gait and mobility: Secondary | ICD-10-CM | POA: Diagnosis not present

## 2022-05-17 NOTE — Therapy (Signed)
OUTPATIENT PHYSICAL THERAPY TREATMENT   Patient Name: [name] MRN: [id number] DOB:1956/09/05, 66 y.o., female Today's Date: 05/17/2022   PT End of Session - 05/17/22 1017     Visit Number 3    Number of Visits 12    Date for PT Re-Evaluation 06/20/22    Authorization Type Medicare    PT Start Time 1017    PT Stop Time 1100    PT Time Calculation (min) 43 min    Activity Tolerance Patient tolerated treatment well    Behavior During Therapy WFL for tasks assessed/performed             Past Medical History:  Diagnosis Date   Allergy    Anemia    Anxiety    Bipolar disorder ([place])    CHF (congestive heart failure) ([place])    Chronic diastolic heart failure ([place])    hospitalized at [facility]   Closed nondisplaced fracture of phalanx of toe of left foot 02/05/8840   Complication of anesthesia    had "breathing problems and ended up in ICU" 03/2010 report on chart   COPD (chronic obstructive pulmonary disease) ([place])    DDD (degenerative disc disease), lumbar    Depression    Diabetes in pregnancy    diet cnontrolled   GERD (gastroesophageal reflux disease)    Glucose intolerance (impaired glucose tolerance)    HLD (hyperlipidemia)    nec/nos   HTN (hypertension)    benign essential   MVP (mitral valve prolapse)    Narcolepsy    Stroke ([place])    Suicide attempt ([place])    hx   Syncope    admx to [facility] in 2/12: Echo with normal LVF; head CT unremarkable, ECG stable, no further w/u   Ulcer    Past Surgical History:  Procedure Laterality Date   AUGMENTATION MAMMAPLASTY  2003   BLADDER SUSPENSION  2002   DENTAL SURGERY  2016   Teeth implants x 4   GASTRIC BYPASS  2014   at [place] MICRODISCECTOMY  09/11/2011   Procedure: LUMBAR LAMINECTOMY/DECOMPRESSION MICRODISCECTOMY;  Surgeon: Tobi Bastos;  Location: [facility];  Service: Orthopedics;  Laterality: Right;  Hemi  Laminectomy/Microdiscectomy L5 - S1 on the Right (X-Ray)   PARTIAL HYSTERECTOMY  1979   REPAIR PERONEAL TENDONS ANKLE Left 2008   after injury    Patient Active Problem List   Diagnosis Date Noted   Labile hypertension 12/07/2020   Unstable gait 12/07/2020   Bilateral chronic knee pain 12/07/2020   Cerebral amyloid angiopathy (CODE) 12/07/2020   Iron deficiency 05/23/2020   Post herpetic neuralgia 02/09/2019   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   Caregiver burden 02/10/2018   Mitral valve prolapse syndrome 08/15/2016   Congestive heart failure ([place]) 08/15/2016   Right cervical radiculopathy 08/09/2016   Essential hypertension, malignant 06/12/2016   History of stroke 03/13/2016   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   Narcolepsy 03/13/2013   Coronary atherosclerosis 03/13/2013   DDD (degenerative disc disease), lumbar    HIRSUTISM 06/02/2009   BIPOLAR DISORDER UNSPECIFIED 05/17/2009   Hyperlipidemia 07/15/2007    PCP: Beatrice Lecher  REFERRING PROVIDER: [name]  REFERRING DIAG: [phone] (ICD-10-CM) - Recurrent pain of right knee M79.671,G89.29 (ICD-10-CM) - Chronic pain of right heel M76.61 (ICD-10-CM) - Achilles tendinitis of right lower extremity   THERAPY DIAG:  Recurrent pain of right knee  Chronic pain of right heel  Achilles tendinitis of right lower extremity  Muscle weakness (generalized)  Other abnormalities of gait and mobility  Unsteadiness on feet  Rationale for Evaluation and Treatment Rehabilitation  ONSET DATE: March 2023  SUBJECTIVE:   SUBJECTIVE STATEMENT: Pt states her knee catches at times with walking.  She gets soreness in the medial knee when lying down at night.  PERTINENT HISTORY: hx brainstem infarct and has significant gait difficulty with vertigo and ataxia   PAIN:  Are you having pain? Yes: NPRS scale: 9 if she touches it, 6 at rest/10 Pain location: R heel to back calf Pain description: Aching, throbbing,  constant, soreness Aggravating factors: Touched, walking, putting weight on it Relieving factors: Has not been able to find anything  Are you having pain? Yes: NPRS scale: 0/10 Pain location: Medial knee Pain description: Throbbing Aggravating factors: Walking Relieving factors: has not been able to find anything besides the shot *hurts at night and the knee throbs   PRECAUTIONS: None; currently wearing cam boot  WEIGHT BEARING RESTRICTIONS No  FALLS:  Has patient fallen in last 6 months? No  LIVING ENVIRONMENT: Lives with: lives with their spouse Lives in: House/apartment Stairs: No Has following equipment at home: None Cares for multiple animals  OCCUPATION: Retired  PLOF: Independent  PATIENT GOALS Improve pain and mobility   OBJECTIVE:  TREATMENT 05/17/22:  There ex: Nu-step x level 3 x 4 minutes *initial medial knee pain but doesn't get worse with nustep SLRs x 10 reps Supine heel slides-- stopped due to pain in the R groin Mini-Bridges x 6 reps with some discomfort noted in low back HS stretches supine  Manual therapy: STM gastroc, R hip adductor, R medial HS, passive stretching heel cord and HS K tape around R knee for stability, and for achilles tendon pain  Self: Worked with foam and heel wedge in L shoe to offload plantar wart area of foot     Not from today's treatment/  from initial evaluation LOWER EXTREMITY AROM:  Active ROM Right eval Left eval  Hip flexion    Hip extension    Hip abduction    Hip adduction    Hip internal rotation    Hip external rotation    Knee flexion 105   Knee extension    Ankle dorsiflexion 2   Ankle plantarflexion 33   Ankle inversion 18   Ankle eversion 20    (Blank rows = not tested)  LOWER EXTREMITY MMT:  MMT Right eval Left eval  Hip flexion 3+/5 4/5  Hip extension    Hip abduction    Hip adduction    Hip internal rotation    Hip external rotation    Knee flexion 4-/5 limited by pain 5/5   Knee extension 4-/5 limited by pain 5/5  Ankle dorsiflexion 3+/5 limited by pain   Ankle plantarflexion 3+/5 limited by pain   Ankle inversion 4/5   Ankle eversion 4/5    (Blank rows = not tested)   PATIENT EDUCATION:  Education details: use of k-tape and skin care Person educated: Patient Education method: Explanation and Handouts Education comprehension: verbalized understanding and needs further education   HOME EXERCISE PROGRAM: Access Code: [email or web address]   ASSESSMENT:  CLINICAL IMPRESSION: The patient arrives with antalgic gait due to R knee pain and use of boot.  PT worked with shoe on L side (instead of flat slipper) to provide some relief of plantar wart pain using foam.  Then worked on supine there ex-- limited by groin  pain and low back pain, as well as weakness.  Patient felt improvement with shoe modifications when leaving session.  She noted some improvement in knee pain with K tape in place.   OBJECTIVE IMPAIRMENTS Abnormal gait, decreased balance, decreased mobility, difficulty walking, decreased ROM, decreased strength, increased edema, increased fascial restrictions, increased muscle spasms, and pain.   ACTIVITY LIMITATIONS lifting, bending, standing, squatting, sleeping, stairs, transfers, and locomotion level  PARTICIPATION LIMITATIONS: meal prep, cleaning, laundry, driving, shopping, and community activity  PERSONAL FACTORS Age, Past/current experiences, and 1 comorbidity: Brainstem CVA affecting R side  are also affecting patient's functional outcome.   REHAB POTENTIAL: Good  CLINICAL DECISION MAKING: Evolving/moderate complexity  EVALUATION COMPLEXITY: Moderate   GOALS: Goals reviewed with patient? Yes  SHORT TERM GOALS: Target date: 05/30/2022  Pt will be ind with initial HEP Baseline: Goal status: IN PROGRESS  2.  Pt will be able to tolerate ankle PROM in all directions with </=4/10 pain  Baseline:  Goal status: IN PROGRESS    LONG TERM GOALS:  Target date: 06/20/2022   Pt will be ind with advanced HEP Baseline:  Goal status: IN PROGRESS  2.  Pt will demo at least 10 deg R ankle DF for amb and stair ascent/descent Baseline:  Goal status: IN PROGRESS  3.  Pt will demo at least 4/5 R LE strength for improved t/fs and stability Baseline:  Goal status: IN PROGRESS  4.  Pt will have increased FOTO score of 45 Baseline: 29 Goal status: IN PROGRESS  5.  Pt will be able to amb at least 1000' ind with reciprocal gait pattern for improved community amb and </=2/10 pain Baseline:  Goal status: IN PROGRESS    PLAN: PT FREQUENCY: 2x/week  PT DURATION: 6 weeks  PLANNED INTERVENTIONS: Therapeutic exercises, Therapeutic activity, Neuromuscular re-education, Balance training, Gait training, Patient/Family education, Self Care, Joint mobilization, Stair training, Orthotic/Fit training, DME instructions, Aquatic Therapy, Dry Needling, Electrical stimulation, Cryotherapy, Moist heat, Taping, Ionotophoresis '4mg'$ /ml Dexamethasone, and Manual therapy  PLAN FOR NEXT SESSION: Continue with tape for stabilization, how did foam in shoe with heel lift do?  Modify as needed, continue ankle A/PROM. STM and TPR of gastroc/soleus (consider TPDN). Initiate strengthening.    [place], PT 05/17/2022, 10:17 AM

## 2022-05-21 ENCOUNTER — Ambulatory Visit: Payer: Medicare Other | Admitting: Physical Therapy

## 2022-05-21 ENCOUNTER — Encounter: Payer: Self-pay | Admitting: Physical Therapy

## 2022-05-21 DIAGNOSIS — G8929 Other chronic pain: Secondary | ICD-10-CM

## 2022-05-21 DIAGNOSIS — M25561 Pain in right knee: Secondary | ICD-10-CM

## 2022-05-21 DIAGNOSIS — R269 Unspecified abnormalities of gait and mobility: Secondary | ICD-10-CM | POA: Diagnosis not present

## 2022-05-21 DIAGNOSIS — R262 Difficulty in walking, not elsewhere classified: Secondary | ICD-10-CM | POA: Diagnosis not present

## 2022-05-21 DIAGNOSIS — M7661 Achilles tendinitis, right leg: Secondary | ICD-10-CM | POA: Diagnosis not present

## 2022-05-21 DIAGNOSIS — M6281 Muscle weakness (generalized): Secondary | ICD-10-CM

## 2022-05-21 DIAGNOSIS — R2681 Unsteadiness on feet: Secondary | ICD-10-CM

## 2022-05-21 DIAGNOSIS — M62838 Other muscle spasm: Secondary | ICD-10-CM | POA: Diagnosis not present

## 2022-05-21 DIAGNOSIS — R29818 Other symptoms and signs involving the nervous system: Secondary | ICD-10-CM

## 2022-05-21 DIAGNOSIS — M79671 Pain in right foot: Secondary | ICD-10-CM | POA: Diagnosis not present

## 2022-05-21 DIAGNOSIS — R2689 Other abnormalities of gait and mobility: Secondary | ICD-10-CM

## 2022-05-21 NOTE — Therapy (Signed)
OUTPATIENT PHYSICAL THERAPY TREATMENT   Patient Name: [name] MRN: [id number] DOB:02-Aug-1956, 66 y.o., female Today's Date: 05/21/2022   PT End of Session - 05/21/22 1320     Visit Number 4    Number of Visits 12    Date for PT Re-Evaluation 06/20/22    Authorization Type Medicare    PT Start Time 1320    PT Stop Time 1400    PT Time Calculation (min) 40 min    Activity Tolerance Patient tolerated treatment well    Behavior During Therapy WFL for tasks assessed/performed             Past Medical History:  Diagnosis Date   Allergy    Anemia    Anxiety    Bipolar disorder ([place])    CHF (congestive heart failure) ([place])    Chronic diastolic heart failure ([place])    hospitalized at [facility]   Closed nondisplaced fracture of phalanx of toe of left foot 05/11/276   Complication of anesthesia    had "breathing problems and ended up in ICU" 03/2010 report on chart   COPD (chronic obstructive pulmonary disease) ([place])    DDD (degenerative disc disease), lumbar    Depression    Diabetes in pregnancy    diet cnontrolled   GERD (gastroesophageal reflux disease)    Glucose intolerance (impaired glucose tolerance)    HLD (hyperlipidemia)    nec/nos   HTN (hypertension)    benign essential   MVP (mitral valve prolapse)    Narcolepsy    Stroke ([place])    Suicide attempt ([place])    hx   Syncope    admx to [facility] in 2/12: Echo with normal LVF; head CT unremarkable, ECG stable, no further w/u   Ulcer    Past Surgical History:  Procedure Laterality Date   AUGMENTATION MAMMAPLASTY  2003   BLADDER SUSPENSION  2002   DENTAL SURGERY  2016   Teeth implants x 4   GASTRIC BYPASS  2014   at [place] MICRODISCECTOMY  09/11/2011   Procedure: LUMBAR LAMINECTOMY/DECOMPRESSION MICRODISCECTOMY;  Surgeon: Tobi Bastos;  Location: [facility];  Service: Orthopedics;  Laterality: Right;  Hemi  Laminectomy/Microdiscectomy L5 - S1 on the Right (X-Ray)   PARTIAL HYSTERECTOMY  1979   REPAIR PERONEAL TENDONS ANKLE Left 2008   after injury    Patient Active Problem List   Diagnosis Date Noted   Labile hypertension 12/07/2020   Unstable gait 12/07/2020   Bilateral chronic knee pain 12/07/2020   Cerebral amyloid angiopathy (CODE) 12/07/2020   Iron deficiency 05/23/2020   Post herpetic neuralgia 02/09/2019   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   Caregiver burden 02/10/2018   Mitral valve prolapse syndrome 08/15/2016   Congestive heart failure ([place]) 08/15/2016   Right cervical radiculopathy 08/09/2016   Essential hypertension, malignant 06/12/2016   History of stroke 03/13/2016   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   Narcolepsy 03/13/2013   Coronary atherosclerosis 03/13/2013   DDD (degenerative disc disease), lumbar    HIRSUTISM 06/02/2009   BIPOLAR DISORDER UNSPECIFIED 05/17/2009   Hyperlipidemia 07/15/2007    PCP: Beatrice Lecher  REFERRING PROVIDER: [name]  REFERRING DIAG: [phone] (ICD-10-CM) - Recurrent pain of right knee M79.671,G89.29 (ICD-10-CM) - Chronic pain of right heel M76.61 (ICD-10-CM) - Achilles tendinitis of right lower extremity   THERAPY DIAG:  Recurrent pain of right knee  Chronic pain of right heel  Achilles tendinitis of right lower extremity  Muscle weakness (generalized)  Other abnormalities of gait and mobility  Unsteadiness on feet  Other symptoms and signs involving the nervous system  Rationale for Evaluation and Treatment Rehabilitation  ONSET DATE: March 2023  SUBJECTIVE:   SUBJECTIVE STATEMENT: Pt states her knee has been feeling better. Pt states that the tape has been helping her.   PERTINENT HISTORY: hx brainstem infarct and has significant gait difficulty with vertigo and ataxia   PAIN:  Are you having pain? Yes: NPRS scale: 5/10 Pain location: R heel to back calf Pain description: Aching, throbbing,  constant, soreness Aggravating factors: Touched, walking, putting weight on it Relieving factors: Has not been able to find anything  Are you having pain? Yes: NPRS scale: <3/10 Pain location: Medial knee Pain description: Throbbing Aggravating factors: Walking Relieving factors: has not been able to find anything besides the shot *hurts at night and the knee throbs   PRECAUTIONS: None; currently wearing cam boot  WEIGHT BEARING RESTRICTIONS No  FALLS:  Has patient fallen in last 6 months? No  LIVING ENVIRONMENT: Lives with: lives with their spouse Lives in: House/apartment Stairs: No Has following equipment at home: None Cares for multiple animals  OCCUPATION: Retired  PLOF: Independent  PATIENT GOALS Improve pain and mobility   OBJECTIVE:  Not from today's treatment/  from initial evaluation LOWER EXTREMITY AROM:  Active ROM Right eval Left eval  Hip flexion    Hip extension    Hip abduction    Hip adduction    Hip internal rotation    Hip external rotation    Knee flexion 105   Knee extension    Ankle dorsiflexion 2   Ankle plantarflexion 33   Ankle inversion 18   Ankle eversion 20    (Blank rows = not tested)  LOWER EXTREMITY MMT:  MMT Right eval Left eval  Hip flexion 3+/5 4/5  Hip extension    Hip abduction    Hip adduction    Hip internal rotation    Hip external rotation    Knee flexion 4-/5 limited by pain 5/5  Knee extension 4-/5 limited by pain 5/5  Ankle dorsiflexion 3+/5 limited by pain   Ankle plantarflexion 3+/5 limited by pain   Ankle inversion 4/5   Ankle eversion 4/5    (Blank rows = not tested)  TREATMENT 05/21/22 Therex: Seated:  Heel slides 2x1 min  Knee ext red tband 2x10  Hamstring curl red tband 2x10  Heel/toe raises 2x10  Hip flexion red tband 2x10  DF stretch 2x30 sec on R  Clamshell red tband 2x10  Manual therapy: STM gastroc Ice massage R achilles tendon Ankle PROM all directions K tape around R knee  for stability, and for achilles tendon pain   05/17/22: There ex: Nu-step x level 3 x 4 minutes *initial medial knee pain but doesn't get worse with nustep SLRs x 10 reps Supine heel slides-- stopped due to pain in the R groin Mini-Bridges x 6 reps with some discomfort noted in low back HS stretches supine  Manual therapy: STM gastroc, R hip adductor, R medial HS, passive stretching heel cord and HS K tape around R knee for stability, and for achilles tendon pain  Self: Worked with foam and heel wedge in L shoe to offload plantar wart area of foot        PATIENT EDUCATION:  Education details: HEP updates Person educated: Patient Education method: Explanation and Handouts Education comprehension: verbalized understanding  and needs further education   HOME EXERCISE PROGRAM: Access Code: [email or web address]   ASSESSMENT:  CLINICAL IMPRESSION: Treatment session focused on progressing pt's knee and ankle exercises as able. Less edema noted in medial right knee this session. Continued taping.  Trial of ice massage for pt's edema along Achilles tendon.   OBJECTIVE IMPAIRMENTS Abnormal gait, decreased balance, decreased mobility, difficulty walking, decreased ROM, decreased strength, increased edema, increased fascial restrictions, increased muscle spasms, and pain.   ACTIVITY LIMITATIONS lifting, bending, standing, squatting, sleeping, stairs, transfers, and locomotion level  PARTICIPATION LIMITATIONS: meal prep, cleaning, laundry, driving, shopping, and community activity  PERSONAL FACTORS Age, Past/current experiences, and 1 comorbidity: Brainstem CVA affecting R side  are also affecting patient's functional outcome.   REHAB POTENTIAL: Good  CLINICAL DECISION MAKING: Evolving/moderate complexity  EVALUATION COMPLEXITY: Moderate   GOALS: Goals reviewed with patient? Yes  SHORT TERM GOALS: Target date: 05/30/2022  Pt will be ind with initial HEP Baseline: Goal status: IN  PROGRESS  2.  Pt will be able to tolerate ankle PROM in all directions with </=4/10 pain  Baseline:  Goal status: IN PROGRESS    LONG TERM GOALS: Target date: 06/20/2022   Pt will be ind with advanced HEP Baseline:  Goal status: IN PROGRESS  2.  Pt will demo at least 10 deg R ankle DF for amb and stair ascent/descent Baseline:  Goal status: IN PROGRESS  3.  Pt will demo at least 4/5 R LE strength for improved t/fs and stability Baseline:  Goal status: IN PROGRESS  4.  Pt will have increased FOTO score of 45 Baseline: 29 Goal status: IN PROGRESS  5.  Pt will be able to amb at least 1000' ind with reciprocal gait pattern for improved community amb and </=2/10 pain Baseline:  Goal status: IN PROGRESS    PLAN: PT FREQUENCY: 2x/week  PT DURATION: 6 weeks  PLANNED INTERVENTIONS: Therapeutic exercises, Therapeutic activity, Neuromuscular re-education, Balance training, Gait training, Patient/Family education, Self Care, Joint mobilization, Stair training, Orthotic/Fit training, DME instructions, Aquatic Therapy, Dry Needling, Electrical stimulation, Cryotherapy, Moist heat, Taping, Ionotophoresis '4mg'$ /ml Dexamethasone, and Manual therapy  PLAN FOR NEXT SESSION: Continue with tape for stabilization, continue ankle A/PROM. STM and TPR of gastroc/soleus (consider TPDN). Initiate strengthening.    Tonea Leiphart April Ma L Milah Recht, PT, DPT 05/21/2022, 1:21 PM

## 2022-05-24 ENCOUNTER — Ambulatory Visit: Payer: Medicare Other | Admitting: Rehabilitative and Restorative Service Providers"

## 2022-05-24 ENCOUNTER — Encounter: Payer: Self-pay | Admitting: Rehabilitative and Restorative Service Providers"

## 2022-05-24 DIAGNOSIS — M7661 Achilles tendinitis, right leg: Secondary | ICD-10-CM | POA: Diagnosis not present

## 2022-05-24 DIAGNOSIS — M79671 Pain in right foot: Secondary | ICD-10-CM | POA: Diagnosis not present

## 2022-05-24 DIAGNOSIS — M25561 Pain in right knee: Secondary | ICD-10-CM

## 2022-05-24 DIAGNOSIS — G8929 Other chronic pain: Secondary | ICD-10-CM

## 2022-05-24 DIAGNOSIS — M62838 Other muscle spasm: Secondary | ICD-10-CM | POA: Diagnosis not present

## 2022-05-24 DIAGNOSIS — R269 Unspecified abnormalities of gait and mobility: Secondary | ICD-10-CM | POA: Diagnosis not present

## 2022-05-24 DIAGNOSIS — M6281 Muscle weakness (generalized): Secondary | ICD-10-CM

## 2022-05-24 DIAGNOSIS — R262 Difficulty in walking, not elsewhere classified: Secondary | ICD-10-CM | POA: Diagnosis not present

## 2022-05-24 NOTE — Therapy (Signed)
OUTPATIENT PHYSICAL THERAPY TREATMENT   Patient Name: [name] MRN: [id number] DOB:Feb 25, 1956, 66 y.o., female Today's Date: 05/24/2022   PT End of Session - 05/24/22 1029     Visit Number 5    Number of Visits 12    Date for PT Re-Evaluation 06/20/22    Authorization Type Medicare    PT Start Time 8502    PT Stop Time 1102    PT Time Calculation (min) 39 min    Activity Tolerance Patient tolerated treatment well    Behavior During Therapy WFL for tasks assessed/performed             Past Medical History:  Diagnosis Date   Allergy    Anemia    Anxiety    Bipolar disorder ([place])    CHF (congestive heart failure) ([place])    Chronic diastolic heart failure ([place])    hospitalized at [facility]   Closed nondisplaced fracture of phalanx of toe of left foot 04/13/4127   Complication of anesthesia    had "breathing problems and ended up in ICU" 03/2010 report on chart   COPD (chronic obstructive pulmonary disease) ([place])    DDD (degenerative disc disease), lumbar    Depression    Diabetes in pregnancy    diet cnontrolled   GERD (gastroesophageal reflux disease)    Glucose intolerance (impaired glucose tolerance)    HLD (hyperlipidemia)    nec/nos   HTN (hypertension)    benign essential   MVP (mitral valve prolapse)    Narcolepsy    Stroke ([place])    Suicide attempt ([place])    hx   Syncope    admx to [facility] in 2/12: Echo with normal LVF; head CT unremarkable, ECG stable, no further w/u   Ulcer    Past Surgical History:  Procedure Laterality Date   AUGMENTATION MAMMAPLASTY  2003   BLADDER SUSPENSION  2002   DENTAL SURGERY  2016   Teeth implants x 4   GASTRIC BYPASS  2014   at [place] MICRODISCECTOMY  09/11/2011   Procedure: LUMBAR LAMINECTOMY/DECOMPRESSION MICRODISCECTOMY;  Surgeon: Tobi Bastos;  Location: [facility];  Service: Orthopedics;  Laterality: Right;  Hemi  Laminectomy/Microdiscectomy L5 - S1 on the Right (X-Ray)   PARTIAL HYSTERECTOMY  1979   REPAIR PERONEAL TENDONS ANKLE Left 2008   after injury    Patient Active Problem List   Diagnosis Date Noted   Labile hypertension 12/07/2020   Unstable gait 12/07/2020   Bilateral chronic knee pain 12/07/2020   Cerebral amyloid angiopathy (CODE) 12/07/2020   Iron deficiency 05/23/2020   Post herpetic neuralgia 02/09/2019   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   Caregiver burden 02/10/2018   Mitral valve prolapse syndrome 08/15/2016   Congestive heart failure ([place]) 08/15/2016   Right cervical radiculopathy 08/09/2016   Essential hypertension, malignant 06/12/2016   History of stroke 03/13/2016   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   Narcolepsy 03/13/2013   Coronary atherosclerosis 03/13/2013   DDD (degenerative disc disease), lumbar    HIRSUTISM 06/02/2009   BIPOLAR DISORDER UNSPECIFIED 05/17/2009   Hyperlipidemia 07/15/2007    PCP: Beatrice Lecher  REFERRING PROVIDER: [name]  REFERRING DIAG: [phone] (ICD-10-CM) - Recurrent pain of right knee M79.671,G89.29 (ICD-10-CM) - Chronic pain of right heel M76.61 (ICD-10-CM) - Achilles tendinitis of right lower extremity   THERAPY DIAG:  Recurrent pain of right knee  Chronic pain of right heel  Achilles tendinitis of right lower extremity  Muscle weakness (generalized)  Rationale for Evaluation and Treatment Rehabilitation  ONSET DATE: March 2023  SUBJECTIVE:   SUBJECTIVE STATEMENT: The patient reports she is able to tolerate wearing her shoes better since using foam last week under L heel.  She arrived today without R boot donned due to feeling she couldn't tolerate the boot today.  She thinks her knee is improving and the tape helps.  Her R heel is sore to the touch.  PERTINENT HISTORY: hx brainstem infarct and has significant gait difficulty with vertigo and ataxia   PAIN:  Are you having pain? Yes: NPRS scale:  5/10 Pain location: R heel to back calf Pain description: Aching, throbbing, constant, soreness Aggravating factors: Touched, walking, putting weight on it Relieving factors: Has not been able to find anything  Are you having pain? Yes: 0/10 in knee today at start of session  PRECAUTIONS: None; currently wearing cam boot  WEIGHT BEARING RESTRICTIONS No  PATIENT GOALS Improve pain and mobility   OBJECTIVE:  TREATMENT 05/24/22 There ex:  Nustep x 5 minutes bilat UEs/LEs level 5  Seated:  Sit<>Stand x 1 rep with UE support *flares knee on R side  Knee ext red band x 10 reps R and L sides  Knee flexion red band 2x10 reps R and L sides  Seated marching x 10 reps with arms abducted to engage core musculature Manual therapy: STM gastroc/soleous and quad Wellsite geologist Dry-Needling  Treatment instructions: Expect mild to moderate muscle soreness. Patient verbalized understanding of these instructions and education. Patient Consent Given: Yes Education handout provided: Yes Muscles treated: gastrocnemius, soleous Treatment response/outcome: The patient tolerated treatment well with palpable lengthening of musculature Modalities: Iontophoresis:  R heel with dexamethasone x 4 hour patch, discussed education re: removal @ 3pm and if any skin irritation occurs.   05/21/22 Therex: Seated:  Heel slides 2x1 min  Knee ext red tband 2x10  Hamstring curl red tband 2x10  Heel/toe raises 2x10  Hip flexion red tband 2x10  DF stretch 2x30 sec on R  Clamshell red tband 2x10  Manual therapy: STM gastroc Ice massage R achilles tendon Ankle PROM all directions K tape around R knee for stability, and for achilles tendon pain   05/17/22: There ex: Nu-step x level 3 x 4 minutes *initial medial knee pain but doesn't get worse with nustep SLRs x 10 reps Supine heel slides-- stopped due to pain in the R groin Mini-Bridges x 6 reps with some discomfort noted in low back HS stretches  supine  Manual therapy: STM gastroc, R hip adductor, R medial HS, passive stretching heel cord and HS K tape around R knee for stability, and for achilles tendon pain  Self: Worked with foam and heel wedge in L shoe to offload plantar wart area of foot     Not from today's treatment/  from initial evaluation LOWER EXTREMITY AROM:  Active ROM Right eval Left eval  Hip flexion    Hip extension    Hip abduction    Hip adduction    Hip internal rotation    Hip external rotation    Knee flexion 105   Knee extension    Ankle dorsiflexion 2   Ankle plantarflexion 33   Ankle inversion 18   Ankle eversion 20    (Blank rows = not tested)  LOWER EXTREMITY MMT:  MMT Right eval Left eval  Hip flexion 3+/5 4/5  Hip extension    Hip abduction  Hip adduction    Hip internal rotation    Hip external rotation    Knee flexion 4-/5 limited by pain 5/5  Knee extension 4-/5 limited by pain 5/5  Ankle dorsiflexion 3+/5 limited by pain   Ankle plantarflexion 3+/5 limited by pain   Ankle inversion 4/5   Ankle eversion 4/5    (Blank rows = not tested)   PATIENT EDUCATION:  Education details: HEP updates, dry needling Person educated: Patient Education method: Explanation and Handouts Education comprehension: verbalized understanding and needs further education   HOME EXERCISE PROGRAM: Access Code: [email or web address] URL: [email or web address] Date: 05/24/2022 Prepared by: Rudell Cobb  Exercises - Seated Toe Curl  - 1 x daily - 7 x weekly - 2 sets - 10 reps - Toe Spreading  - 1 x daily - 7 x weekly - 2 sets - 10 reps - Toe Yoga - Alternating Great Toe and Lesser Toe Extension  - 1 x daily - 7 x weekly - 2 sets - 10 reps - Seated Calf Stretch with Strap  - 1 x daily - 7 x weekly - 3 sets - 30 sec hold - Seated Heel Slide  - 1 x daily - 7 x weekly - 2 sets - 10 reps - Ankle Inversion Eversion Towel Slide  - 1 x daily - 7 x weekly - 2 sets - 10 reps - Seated Heel  Toe Raises  - 1 x daily - 7 x weekly - 2 sets - 10 reps - Seated Knee Extension with Resistance  - 1 x daily - 7 x weekly - 2 sets - 10 reps - Seated Hamstring Curls with Resistance  - 1 x daily - 7 x weekly - 2 sets - 10 reps  Patient Education - Trigger Point Dry Needling   ASSESSMENT:  CLINICAL IMPRESSION: The patient arrived without her boot and had a flare up of knee pain during session after ther ex that improved with STM and taping of the R knee.  She complains of edema and tenderness to touch R achilles tendon. PT trialed iontophoresis.   OBJECTIVE IMPAIRMENTS Abnormal gait, decreased balance, decreased mobility, difficulty walking, decreased ROM, decreased strength, increased edema, increased fascial restrictions, increased muscle spasms, and pain.   ACTIVITY LIMITATIONS lifting, bending, standing, squatting, sleeping, stairs, transfers, and locomotion level  PARTICIPATION LIMITATIONS: meal prep, cleaning, laundry, driving, shopping, and community activity  PERSONAL FACTORS Age, Past/current experiences, and 1 comorbidity: Brainstem CVA affecting R side  are also affecting patient's functional outcome.   REHAB POTENTIAL: Good  CLINICAL DECISION MAKING: Evolving/moderate complexity  EVALUATION COMPLEXITY: Moderate   GOALS: Goals reviewed with patient? Yes  SHORT TERM GOALS: Target date: 05/30/2022  Pt will be ind with initial HEP Baseline: Goal status: IN PROGRESS  2.  Pt will be able to tolerate ankle PROM in all directions with </=4/10 pain  Baseline:  Goal status: IN PROGRESS    LONG TERM GOALS: Target date: 06/20/2022   Pt will be ind with advanced HEP Baseline:  Goal status: IN PROGRESS  2.  Pt will demo at least 10 deg R ankle DF for amb and stair ascent/descent Baseline:  Goal status: IN PROGRESS  3.  Pt will demo at least 4/5 R LE strength for improved t/fs and stability Baseline:  Goal status: IN PROGRESS  4.  Pt will have increased FOTO score  of 45 Baseline: 29 Goal status: IN PROGRESS  5.  Pt will be able to amb at least 1000' ind with reciprocal  gait pattern for improved community amb and </=2/10 pain Baseline:  Goal status: IN PROGRESS    PLAN: PT FREQUENCY: 2x/week  PT DURATION: 6 weeks  PLANNED INTERVENTIONS: Therapeutic exercises, Therapeutic activity, Neuromuscular re-education, Balance training, Gait training, Patient/Family education, Self Care, Joint mobilization, Stair training, Orthotic/Fit training, DME instructions, Aquatic Therapy, Dry Needling, Electrical stimulation, Cryotherapy, Moist heat, Taping, Ionotophoresis '4mg'$ /ml Dexamethasone, and Manual therapy  PLAN FOR NEXT SESSION: Continue with tape for stabilization, continue ankle A/PROM. STM and TPR of gastroc/soleus (consider TPDN). Initiate strengthening.    [place], PT 05/24/2022, 10:29 AM

## 2022-05-27 NOTE — Chronic Care Management (AMB) (Signed)
  Chronic Care Management Note  05/27/2022 Name: [name] MRN: [id number] DOB: 03-03-1956  [name] is a 66 y.o. year old female who is a primary care patient of Hali Marry, MD and is actively engaged with the care management team. I reached out to [name] by phone today to assist with re-scheduling a follow up visit with the Pharmacist  Follow up plan: Telephone appointment with care management team member scheduled for: 06/05/2022  Julian Hy, [place] Direct Dial: [phone]

## 2022-05-28 ENCOUNTER — Encounter: Payer: Self-pay | Admitting: Physical Therapy

## 2022-05-28 ENCOUNTER — Ambulatory Visit: Payer: Medicare Other | Admitting: Physical Therapy

## 2022-05-28 DIAGNOSIS — M7661 Achilles tendinitis, right leg: Secondary | ICD-10-CM

## 2022-05-28 DIAGNOSIS — M25561 Pain in right knee: Secondary | ICD-10-CM

## 2022-05-28 DIAGNOSIS — R269 Unspecified abnormalities of gait and mobility: Secondary | ICD-10-CM | POA: Diagnosis not present

## 2022-05-28 DIAGNOSIS — G8929 Other chronic pain: Secondary | ICD-10-CM | POA: Diagnosis not present

## 2022-05-28 DIAGNOSIS — R2689 Other abnormalities of gait and mobility: Secondary | ICD-10-CM

## 2022-05-28 DIAGNOSIS — R262 Difficulty in walking, not elsewhere classified: Secondary | ICD-10-CM | POA: Diagnosis not present

## 2022-05-28 DIAGNOSIS — M79671 Pain in right foot: Secondary | ICD-10-CM | POA: Diagnosis not present

## 2022-05-28 DIAGNOSIS — M6281 Muscle weakness (generalized): Secondary | ICD-10-CM

## 2022-05-28 DIAGNOSIS — M62838 Other muscle spasm: Secondary | ICD-10-CM | POA: Diagnosis not present

## 2022-05-28 NOTE — Therapy (Signed)
OUTPATIENT PHYSICAL THERAPY TREATMENT   Patient Name: [name] MRN: [id number] DOB:16-Jul-1956, 66 y.o., female Today's Date: 05/28/2022   PT End of Session - 05/28/22 1111     Visit Number 6    Number of Visits 12    Date for PT Re-Evaluation 06/20/22    Authorization Type Medicare    PT Start Time 1111   Late arrival   PT Stop Time 1145    PT Time Calculation (min) 34 min    Activity Tolerance Patient tolerated treatment well    Behavior During Therapy WFL for tasks assessed/performed             Past Medical History:  Diagnosis Date   Allergy    Anemia    Anxiety    Bipolar disorder ([place])    CHF (congestive heart failure) ([place])    Chronic diastolic heart failure ([place])    hospitalized at [facility]   Closed nondisplaced fracture of phalanx of toe of left foot 04/11/1606   Complication of anesthesia    had "breathing problems and ended up in ICU" 03/2010 report on chart   COPD (chronic obstructive pulmonary disease) ([place])    DDD (degenerative disc disease), lumbar    Depression    Diabetes in pregnancy    diet cnontrolled   GERD (gastroesophageal reflux disease)    Glucose intolerance (impaired glucose tolerance)    HLD (hyperlipidemia)    nec/nos   HTN (hypertension)    benign essential   MVP (mitral valve prolapse)    Narcolepsy    Stroke ([place])    Suicide attempt ([place])    hx   Syncope    admx to [facility] in 2/12: Echo with normal LVF; head CT unremarkable, ECG stable, no further w/u   Ulcer    Past Surgical History:  Procedure Laterality Date   AUGMENTATION MAMMAPLASTY  2003   BLADDER SUSPENSION  2002   DENTAL SURGERY  2016   Teeth implants x 4   GASTRIC BYPASS  2014   at [place] MICRODISCECTOMY  09/11/2011   Procedure: LUMBAR LAMINECTOMY/DECOMPRESSION MICRODISCECTOMY;  Surgeon: Tobi Bastos;  Location: [facility];  Service: Orthopedics;  Laterality: Right;  Hemi  Laminectomy/Microdiscectomy L5 - S1 on the Right (X-Ray)   PARTIAL HYSTERECTOMY  1979   REPAIR PERONEAL TENDONS ANKLE Left 2008   after injury    Patient Active Problem List   Diagnosis Date Noted   Labile hypertension 12/07/2020   Unstable gait 12/07/2020   Bilateral chronic knee pain 12/07/2020   Cerebral amyloid angiopathy (CODE) 12/07/2020   Iron deficiency 05/23/2020   Post herpetic neuralgia 02/09/2019   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   Caregiver burden 02/10/2018   Mitral valve prolapse syndrome 08/15/2016   Congestive heart failure ([place]) 08/15/2016   Right cervical radiculopathy 08/09/2016   Essential hypertension, malignant 06/12/2016   History of stroke 03/13/2016   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   Narcolepsy 03/13/2013   Coronary atherosclerosis 03/13/2013   DDD (degenerative disc disease), lumbar    HIRSUTISM 06/02/2009   BIPOLAR DISORDER UNSPECIFIED 05/17/2009   Hyperlipidemia 07/15/2007    PCP: Beatrice Lecher  REFERRING PROVIDER: [name]  REFERRING DIAG: [phone] (ICD-10-CM) - Recurrent pain of right knee M79.671,G89.29 (ICD-10-CM) - Chronic pain of right heel M76.61 (ICD-10-CM) - Achilles tendinitis of right lower extremity   THERAPY DIAG:  Recurrent pain of right knee  Chronic pain  of right heel  Achilles tendinitis of right lower extremity  Muscle weakness (generalized)  Other abnormalities of gait and mobility  Rationale for Evaluation and Treatment Rehabilitation  ONSET DATE: March 2023  SUBJECTIVE:   SUBJECTIVE STATEMENT: Pt states she is back in her boot. Knee has continued to improve. Ionto patch on heel did help.   PERTINENT HISTORY: hx brainstem infarct and has significant gait difficulty with vertigo and ataxia   PAIN:  Are you having pain? Yes: NPRS scale: 2/10 Pain location: R heel to back calf Pain description: Aching, throbbing, constant, soreness Aggravating factors: Touched, walking, putting weight  on it Relieving factors: Has not been able to find anything  Are you having pain? Yes: 0/10 in knee today at start of session  PRECAUTIONS: None; cleared by Dr. Georgina Snell to come out of [place] No  PATIENT GOALS Improve pain and mobility   OBJECTIVE:  TREATMENT 05/28/22 Therex: Nustep L5 x 5 mines UEs/LEs Seated Gastroc stretch with strap 2x30 sec Ankle PF 2x10 green TB Ankle DF 2x10 green TB Knee ext green TB 2x10 Knee flexion green TB 2x10 Manual therapy:  STM & TPR gastroc/soleus Modalities: Iontophoresis:  R heel with dexamethasone x 4 hour patch,    05/24/22 There ex:  Nustep x 5 minutes bilat UEs/LEs level 5  Seated:  Sit<>Stand x 1 rep with UE support *flares knee on R side  Knee ext red band x 10 reps R and L sides  Knee flexion red band 2x10 reps R and L sides  Seated marching x 10 reps with arms abducted to engage core musculature Manual therapy: STM gastroc/soleous and quad Wellsite geologist Dry-Needling  Treatment instructions: Expect mild to moderate muscle soreness. Patient verbalized understanding of these instructions and education. Patient Consent Given: Yes Education handout provided: Yes Muscles treated: gastrocnemius, soleous Treatment response/outcome: The patient tolerated treatment well with palpable lengthening of musculature Modalities: Iontophoresis:  R heel with dexamethasone x 4 hour patch, discussed education re: removal @ 3pm and if any skin irritation occurs.   05/21/22 Therex: Seated:  Heel slides 2x1 min  Knee ext red tband 2x10  Hamstring curl red tband 2x10  Heel/toe raises 2x10  Hip flexion red tband 2x10  DF stretch 2x30 sec on R  Clamshell red tband 2x10  Manual therapy: STM gastroc Ice massage R achilles tendon Ankle PROM all directions K tape around R knee for stability, and for achilles tendon pain     Not from today's treatment/  from initial evaluation LOWER EXTREMITY AROM:  Active ROM  Right eval Left eval  Hip flexion    Hip extension    Hip abduction    Hip adduction    Hip internal rotation    Hip external rotation    Knee flexion 105   Knee extension    Ankle dorsiflexion 2   Ankle plantarflexion 33   Ankle inversion 18   Ankle eversion 20    (Blank rows = not tested)  LOWER EXTREMITY MMT:  MMT Right eval Left eval  Hip flexion 3+/5 4/5  Hip extension    Hip abduction    Hip adduction    Hip internal rotation    Hip external rotation    Knee flexion 4-/5 limited by pain 5/5  Knee extension 4-/5 limited by pain 5/5  Ankle dorsiflexion 3+/5 limited by pain   Ankle plantarflexion 3+/5 limited by pain   Ankle inversion 4/5   Ankle eversion 4/5    (  Blank rows = not tested)   PATIENT EDUCATION:  Education details: Weaning out of boot, using heel lifts, bringing other shoes into therapy next session Person educated: Patient Education method: Explanation and Handouts Education comprehension: verbalized understanding and needs further education   HOME EXERCISE PROGRAM: Access Code: [email or web address] URL: [email or web address] Date: 05/28/2022 Prepared by: Estill Bamberg April Thurnell Garbe  Exercises - Seated Toe Curl  - 1 x daily - 7 x weekly - 2 sets - 10 reps - Toe Spreading  - 1 x daily - 7 x weekly - 2 sets - 10 reps - Toe Yoga - Alternating Great Toe and Lesser Toe Extension  - 1 x daily - 7 x weekly - 2 sets - 10 reps - Seated Calf Stretch with Strap  - 1 x daily - 7 x weekly - 3 sets - 30 sec hold - Seated Heel Slide  - 1 x daily - 7 x weekly - 2 sets - 10 reps - Ankle Inversion Eversion Towel Slide  - 1 x daily - 7 x weekly - 2 sets - 10 reps - Seated Heel Toe Raises  - 1 x daily - 7 x weekly - 2 sets - 10 reps - Seated Knee Extension with Resistance  - 1 x daily - 7 x weekly - 2 sets - 10 reps - Seated Hamstring Curls with Resistance  - 1 x daily - 7 x weekly - 2 sets - 10 reps - Seated Ankle Plantarflexion with Resistance  - 1 x daily -  7 x weekly - 2 sets - 10 reps - CLX Ankle Dorsiflexion and Eversion  - 1 x daily - 7 x weekly - 2 sets - 10 reps   ASSESSMENT:  CLINICAL IMPRESSION: Pt with improving pain and decreased edema in knee and ankle. Continued ionto -- pt had good response from prior session. Continued progressing knee and ankle strengthening. Discussed weaning out of boot (50% in, 50% out of boot at home). Provided pt heel lift for R shoe.    OBJECTIVE IMPAIRMENTS Abnormal gait, decreased balance, decreased mobility, difficulty walking, decreased ROM, decreased strength, increased edema, increased fascial restrictions, increased muscle spasms, and pain.   ACTIVITY LIMITATIONS lifting, bending, standing, squatting, sleeping, stairs, transfers, and locomotion level  PARTICIPATION LIMITATIONS: meal prep, cleaning, laundry, driving, shopping, and community activity  PERSONAL FACTORS Age, Past/current experiences, and 1 comorbidity: Brainstem CVA affecting R side  are also affecting patient's functional outcome.   REHAB POTENTIAL: Good  CLINICAL DECISION MAKING: Evolving/moderate complexity  EVALUATION COMPLEXITY: Moderate   GOALS: Goals reviewed with patient? Yes  SHORT TERM GOALS: Target date: 05/30/2022  Pt will be ind with initial HEP Baseline: Goal status: IN PROGRESS  2.  Pt will be able to tolerate ankle PROM in all directions with </=4/10 pain  Baseline:  Goal status: IN PROGRESS    LONG TERM GOALS: Target date: 06/20/2022   Pt will be ind with advanced HEP Baseline:  Goal status: IN PROGRESS  2.  Pt will demo at least 10 deg R ankle DF for amb and stair ascent/descent Baseline:  Goal status: IN PROGRESS  3.  Pt will demo at least 4/5 R LE strength for improved t/fs and stability Baseline:  Goal status: IN PROGRESS  4.  Pt will have increased FOTO score of 45 Baseline: 29 Goal status: IN PROGRESS  5.  Pt will be able to amb at least 1000' ind with reciprocal gait pattern for  improved community amb and </=2/10 pain Baseline:  Goal status: IN PROGRESS    PLAN: PT FREQUENCY: 2x/week  PT DURATION: 6 weeks  PLANNED INTERVENTIONS: Therapeutic exercises, Therapeutic activity, Neuromuscular re-education, Balance training, Gait training, Patient/Family education, Self Care, Joint mobilization, Stair training, Orthotic/Fit training, DME instructions, Aquatic Therapy, Dry Needling, Electrical stimulation, Cryotherapy, Moist heat, Taping, Ionotophoresis '4mg'$ /ml Dexamethasone, and Manual therapy  PLAN FOR NEXT SESSION: Continue with tape for stabilization, continue ankle A/PROM. STM and TPR of gastroc/soleus (consider TPDN). Initiate strengthening.    Sampson Self April Ma L Leslye Puccini, PT, DPT 05/28/2022, 11:12 AM

## 2022-05-31 ENCOUNTER — Ambulatory Visit: Payer: Medicare Other | Admitting: Rehabilitative and Restorative Service Providers"

## 2022-05-31 ENCOUNTER — Encounter: Payer: Self-pay | Admitting: Rehabilitative and Restorative Service Providers"

## 2022-05-31 DIAGNOSIS — G8929 Other chronic pain: Secondary | ICD-10-CM | POA: Diagnosis not present

## 2022-05-31 DIAGNOSIS — M62838 Other muscle spasm: Secondary | ICD-10-CM | POA: Diagnosis not present

## 2022-05-31 DIAGNOSIS — M25561 Pain in right knee: Secondary | ICD-10-CM

## 2022-05-31 DIAGNOSIS — M7661 Achilles tendinitis, right leg: Secondary | ICD-10-CM | POA: Diagnosis not present

## 2022-05-31 DIAGNOSIS — R269 Unspecified abnormalities of gait and mobility: Secondary | ICD-10-CM | POA: Diagnosis not present

## 2022-05-31 DIAGNOSIS — M6281 Muscle weakness (generalized): Secondary | ICD-10-CM

## 2022-05-31 DIAGNOSIS — M79671 Pain in right foot: Secondary | ICD-10-CM | POA: Diagnosis not present

## 2022-05-31 DIAGNOSIS — R262 Difficulty in walking, not elsewhere classified: Secondary | ICD-10-CM | POA: Diagnosis not present

## 2022-05-31 NOTE — Therapy (Signed)
OUTPATIENT PHYSICAL THERAPY TREATMENT   Patient Name: [name] MRN: [id number] DOB:1955/11/20, 66 y.o., female Today's Date: 05/31/2022   PT End of Session - 05/31/22 1021     Visit Number 7    Number of Visits 12    Date for PT Re-Evaluation 06/20/22    Authorization Type Medicare    PT Start Time 1020    PT Stop Time 1100    PT Time Calculation (min) 40 min    Activity Tolerance Patient tolerated treatment well    Behavior During Therapy WFL for tasks assessed/performed             Past Medical History:  Diagnosis Date   Allergy    Anemia    Anxiety    Bipolar disorder ([place])    CHF (congestive heart failure) ([place])    Chronic diastolic heart failure ([place])    hospitalized at [facility]   Closed nondisplaced fracture of phalanx of toe of left foot 01/10/2702   Complication of anesthesia    had "breathing problems and ended up in ICU" 03/2010 report on chart   COPD (chronic obstructive pulmonary disease) ([place])    DDD (degenerative disc disease), lumbar    Depression    Diabetes in pregnancy    diet cnontrolled   GERD (gastroesophageal reflux disease)    Glucose intolerance (impaired glucose tolerance)    HLD (hyperlipidemia)    nec/nos   HTN (hypertension)    benign essential   MVP (mitral valve prolapse)    Narcolepsy    Stroke ([place])    Suicide attempt ([place])    hx   Syncope    admx to [facility] in 2/12: Echo with normal LVF; head CT unremarkable, ECG stable, no further w/u   Ulcer    Past Surgical History:  Procedure Laterality Date   AUGMENTATION MAMMAPLASTY  2003   BLADDER SUSPENSION  2002   DENTAL SURGERY  2016   Teeth implants x 4   GASTRIC BYPASS  2014   at [place] MICRODISCECTOMY  09/11/2011   Procedure: LUMBAR LAMINECTOMY/DECOMPRESSION MICRODISCECTOMY;  Surgeon: Tobi Bastos;  Location: [facility];  Service: Orthopedics;  Laterality: Right;  Hemi  Laminectomy/Microdiscectomy L5 - S1 on the Right (X-Ray)   PARTIAL HYSTERECTOMY  1979   REPAIR PERONEAL TENDONS ANKLE Left 2008   after injury    Patient Active Problem List   Diagnosis Date Noted   Labile hypertension 12/07/2020   Unstable gait 12/07/2020   Bilateral chronic knee pain 12/07/2020   Cerebral amyloid angiopathy (CODE) 12/07/2020   Iron deficiency 05/23/2020   Post herpetic neuralgia 02/09/2019   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   Caregiver burden 02/10/2018   Mitral valve prolapse syndrome 08/15/2016   Congestive heart failure ([place]) 08/15/2016   Right cervical radiculopathy 08/09/2016   Essential hypertension, malignant 06/12/2016   History of stroke 03/13/2016   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   Narcolepsy 03/13/2013   Coronary atherosclerosis 03/13/2013   DDD (degenerative disc disease), lumbar    HIRSUTISM 06/02/2009   BIPOLAR DISORDER UNSPECIFIED 05/17/2009   Hyperlipidemia 07/15/2007    PCP: Beatrice Lecher  REFERRING PROVIDER: [name]  REFERRING DIAG: [phone] (ICD-10-CM) - Recurrent pain of right knee M79.671,G89.29 (ICD-10-CM) - Chronic pain of right heel M76.61 (ICD-10-CM) - Achilles tendinitis of right lower extremity   THERAPY DIAG:  Recurrent pain of right knee  Chronic pain of right heel  Achilles tendinitis of right lower extremity  Muscle weakness (generalized)  Rationale for Evaluation and Treatment Rehabilitation  ONSET DATE: March 2023  SUBJECTIVE:   SUBJECTIVE STATEMENT: The patient is going without the boot.  She is in no pain today with ankle or knee.  She has mild soreness in the heel when she touches it.  PERTINENT HISTORY: hx brainstem infarct and has significant gait difficulty with vertigo and ataxia   PAIN:  Are you having pain? No, 0/10 today. Are you having pain? No,  0/10 in knee today at start of session  PRECAUTIONS: None; cleared by Dr. Georgina Snell to come out of boot  PATIENT GOALS Improve  pain and mobility   OBJECTIVE:  TREATMENT 05/31/22 Therex: Nustep L5 x 5.5 minutes LEs only Standing Gastroc stretch -- had to back off and do less ROM for R side due to discomfort Ankle PF 2x10 green TB R and L Ankle DF 2x10 green TB R and L Knee ext green TB 2x10 R and L Knee flexion green TB 2x10 R and L Seated hip adductor squeeze with ball x 15 reps x 1 set Manual therapy:  STM & TPR gastroc/soleus, plantar surface of the R foot with P/Rom overstretch great toe extension Modalities: Iontophoresis:  R heel with dexamethasone x 4 hour patch No pain at end of session  TREATMENT 05/28/22 Therex: Nustep L5 x 5 mines UEs/LEs Seated Gastroc stretch with strap 2x30 sec Ankle PF 2x10 green TB Ankle DF 2x10 green TB Knee ext green TB 2x10 Knee flexion green TB 2x10 Manual therapy:  STM & TPR gastroc/soleus Modalities: Iontophoresis:  R heel with dexamethasone x 4 hour patch,    05/24/22 There ex:  Nustep x 5 minutes bilat UEs/LEs level 5  Seated:  Sit<>Stand x 1 rep with UE support *flares knee on R side  Knee ext red band x 10 reps R and L sides  Knee flexion red band 2x10 reps R and L sides  Seated marching x 10 reps with arms abducted to engage core musculature Manual therapy: STM gastroc/soleous and quad Wellsite geologist Dry-Needling  Treatment instructions: Expect mild to moderate muscle soreness. Patient verbalized understanding of these instructions and education. Patient Consent Given: Yes Education handout provided: Yes Muscles treated: gastrocnemius, soleous Treatment response/outcome: The patient tolerated treatment well with palpable lengthening of musculature Modalities: Iontophoresis:  R heel with dexamethasone x 4 hour patch, discussed education re: removal @ 3pm and if any skin irritation occurs.   Not from today's treatment/  from initial evaluation LOWER EXTREMITY AROM:  Active ROM Right eval Left eval  Hip flexion    Hip extension    Hip  abduction    Hip adduction    Hip internal rotation    Hip external rotation    Knee flexion 105   Knee extension    Ankle dorsiflexion 2   Ankle plantarflexion 33   Ankle inversion 18   Ankle eversion 20    (Blank rows = not tested)  LOWER EXTREMITY MMT:  MMT Right eval Left eval  Hip flexion 3+/5 4/5  Hip extension    Hip abduction    Hip adduction    Hip internal rotation    Hip external rotation    Knee flexion 4-/5 limited by pain 5/5  Knee extension 4-/5 limited by pain 5/5  Ankle dorsiflexion 3+/5 limited by pain   Ankle plantarflexion 3+/5 limited by pain   Ankle inversion 4/5   Ankle eversion 4/5    (  Blank rows = not tested)   PATIENT EDUCATION:  Education details: Weaning out of boot, using heel lifts, bringing other shoes into therapy next session Person educated: Patient Education method: Explanation and Handouts Education comprehension: verbalized understanding and needs further education   HOME EXERCISE PROGRAM: Access Code: [email or web address] URL: [email or web address] Date: 05/28/2022 Prepared by: Estill Bamberg April Thurnell Garbe  Exercises - Seated Toe Curl  - 1 x daily - 7 x weekly - 2 sets - 10 reps - Toe Spreading  - 1 x daily - 7 x weekly - 2 sets - 10 reps - Toe Yoga - Alternating Great Toe and Lesser Toe Extension  - 1 x daily - 7 x weekly - 2 sets - 10 reps - Seated Calf Stretch with Strap  - 1 x daily - 7 x weekly - 3 sets - 30 sec hold - Seated Heel Slide  - 1 x daily - 7 x weekly - 2 sets - 10 reps - Ankle Inversion Eversion Towel Slide  - 1 x daily - 7 x weekly - 2 sets - 10 reps - Seated Heel Toe Raises  - 1 x daily - 7 x weekly - 2 sets - 10 reps - Seated Knee Extension with Resistance  - 1 x daily - 7 x weekly - 2 sets - 10 reps - Seated Hamstring Curls with Resistance  - 1 x daily - 7 x weekly - 2 sets - 10 reps - Seated Ankle Plantarflexion with Resistance  - 1 x daily - 7 x weekly - 2 sets - 10 reps - CLX Ankle Dorsiflexion and  Eversion  - 1 x daily - 7 x weekly - 2 sets - 10 reps   ASSESSMENT:  CLINICAL IMPRESSION: Pt is weaning out of boot 50/50 time at home.  She uses it with community gait.  She continues with improvement and arrives today with no pain in knee or ankle unless she presses directly on achilles tendon.  Continued ionto -- pt had good response from prior session. Continued progressing knee and ankle strengthening.     OBJECTIVE IMPAIRMENTS Abnormal gait, decreased balance, decreased mobility, difficulty walking, decreased ROM, decreased strength, increased edema, increased fascial restrictions, increased muscle spasms, and pain.   ACTIVITY LIMITATIONS lifting, bending, standing, squatting, sleeping, stairs, transfers, and locomotion level  PARTICIPATION LIMITATIONS: meal prep, cleaning, laundry, driving, shopping, and community activity  PERSONAL FACTORS Age, Past/current experiences, and 1 comorbidity: Brainstem CVA affecting R side  are also affecting patient's functional outcome.   REHAB POTENTIAL: Good  CLINICAL DECISION MAKING: Evolving/moderate complexity  EVALUATION COMPLEXITY: Moderate   GOALS: Goals reviewed with patient? Yes  SHORT TERM GOALS: Target date: 05/30/2022  Pt will be ind with initial HEP Baseline: Goal status: ACHIEVED  2.  Pt will be able to tolerate ankle PROM in all directions with </=4/10 pain  Baseline:  Goal status: ACHIEVED-- only pain is direct pressure on achilles tendon R ankle    LONG TERM GOALS: Target date: 06/20/2022   Pt will be ind with advanced HEP Baseline:  Goal status: IN PROGRESS  2.  Pt will demo at least 10 deg R ankle DF for amb and stair ascent/descent Baseline:  Goal status: IN PROGRESS  3.  Pt will demo at least 4/5 R LE strength for improved t/fs and stability Baseline:  Goal status: IN PROGRESS  4.  Pt will have increased FOTO score of 45 Baseline: 29 Goal status: IN PROGRESS  5.  Pt will be able to  amb at least 1000'  ind with reciprocal gait pattern for improved community amb and </=2/10 pain Baseline:  Goal status: IN PROGRESS    PLAN: PT FREQUENCY: 2x/week  PT DURATION: 6 weeks  PLANNED INTERVENTIONS: Therapeutic exercises, Therapeutic activity, Neuromuscular re-education, Balance training, Gait training, Patient/Family education, Self Care, Joint mobilization, Stair training, Orthotic/Fit training, DME instructions, Aquatic Therapy, Dry Needling, Electrical stimulation, Cryotherapy, Moist heat, Taping, Ionotophoresis '4mg'$ /ml Dexamethasone, and Manual therapy  PLAN FOR NEXT SESSION: Continue LE strengthening, STM and TPR of gastroc/soleus (consider TPDN).    [place], PT 05/31/2022, 10:21 AM

## 2022-06-02 ENCOUNTER — Other Ambulatory Visit: Payer: Self-pay | Admitting: Family Medicine

## 2022-06-04 ENCOUNTER — Ambulatory Visit: Payer: Medicare Other | Admitting: Physical Therapy

## 2022-06-04 ENCOUNTER — Encounter: Payer: Self-pay | Admitting: Physical Therapy

## 2022-06-04 DIAGNOSIS — M7661 Achilles tendinitis, right leg: Secondary | ICD-10-CM

## 2022-06-04 DIAGNOSIS — R269 Unspecified abnormalities of gait and mobility: Secondary | ICD-10-CM | POA: Diagnosis not present

## 2022-06-04 DIAGNOSIS — R262 Difficulty in walking, not elsewhere classified: Secondary | ICD-10-CM | POA: Diagnosis not present

## 2022-06-04 DIAGNOSIS — M62838 Other muscle spasm: Secondary | ICD-10-CM | POA: Diagnosis not present

## 2022-06-04 DIAGNOSIS — G8929 Other chronic pain: Secondary | ICD-10-CM

## 2022-06-04 DIAGNOSIS — M79671 Pain in right foot: Secondary | ICD-10-CM | POA: Diagnosis not present

## 2022-06-04 DIAGNOSIS — R2689 Other abnormalities of gait and mobility: Secondary | ICD-10-CM

## 2022-06-04 DIAGNOSIS — M6281 Muscle weakness (generalized): Secondary | ICD-10-CM

## 2022-06-04 DIAGNOSIS — M25561 Pain in right knee: Secondary | ICD-10-CM

## 2022-06-04 NOTE — Therapy (Addendum)
OUTPATIENT PHYSICAL THERAPY TREATMENT AND DISCHARGE   Patient Name: [name] MRN: [id number] DOB:24-Dec-1955, 66 y.o., female Today's Date: 06/04/2022  PHYSICAL THERAPY DISCHARGE SUMMARY  Visits from Start of Care: 8  Current functional level related to goals / functional outcomes: See below   Remaining deficits: See below. Tenderness to touch on calcaneus but otherwise no issues.   Education / Equipment: See below   Patient agrees to discharge. Patient goals were met. Patient is being discharged due to being pleased with the current functional level.    PT End of Session - 06/04/22 1023     Visit Number 8    Number of Visits 12    Date for PT Re-Evaluation 06/20/22    Authorization Type Medicare    PT Start Time 1023   late arrival   PT Stop Time 1100    PT Time Calculation (min) 37 min    Activity Tolerance Patient tolerated treatment well    Behavior During Therapy WFL for tasks assessed/performed             Past Medical History:  Diagnosis Date   Allergy    Anemia    Anxiety    Bipolar disorder ([place])    CHF (congestive heart failure) ([facility])    Chronic diastolic heart failure ([place])    hospitalized at [facility]   Closed nondisplaced fracture of phalanx of toe of left foot 05/10/6658   Complication of anesthesia    had "breathing problems and ended up in ICU" 03/2010 report on chart   COPD (chronic obstructive pulmonary disease) ([place])    DDD (degenerative disc disease), lumbar    Depression    Diabetes in pregnancy    diet cnontrolled   GERD (gastroesophageal reflux disease)    Glucose intolerance (impaired glucose tolerance)    HLD (hyperlipidemia)    nec/nos   HTN (hypertension)    benign essential   MVP (mitral valve prolapse)    Narcolepsy    Stroke ([place])    Suicide attempt ([place])    hx   Syncope    admx to [facility] in 2/12: Echo with normal LVF; head CT unremarkable, ECG stable, no further w/u   Ulcer    Past Surgical History:   Procedure Laterality Date   AUGMENTATION MAMMAPLASTY  2003   BLADDER SUSPENSION  2002   DENTAL SURGERY  2016   Teeth implants x 4   GASTRIC BYPASS  2014   at [place] MICRODISCECTOMY  09/11/2011   Procedure: LUMBAR LAMINECTOMY/DECOMPRESSION MICRODISCECTOMY;  Surgeon: Tobi Bastos;  Location: [facility];  Service: Orthopedics;  Laterality: Right;  Hemi Laminectomy/Microdiscectomy L5 - S1 on the Right (X-Ray)   PARTIAL HYSTERECTOMY  1979   REPAIR PERONEAL TENDONS ANKLE Left 2008   after injury    Patient Active Problem List   Diagnosis Date Noted   Labile hypertension 12/07/2020   Unstable gait 12/07/2020   Bilateral chronic knee pain 12/07/2020   Cerebral amyloid angiopathy (CODE) 12/07/2020   Iron deficiency 05/23/2020   Post herpetic neuralgia 02/09/2019   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   Caregiver burden 02/10/2018   Mitral valve prolapse syndrome 08/15/2016   Congestive heart failure ([place]) 08/15/2016   Right cervical radiculopathy 08/09/2016   Essential hypertension, malignant 06/12/2016   History of stroke 03/13/2016   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   Narcolepsy 03/13/2013   Coronary atherosclerosis 03/13/2013  DDD (degenerative disc disease), lumbar    HIRSUTISM 06/02/2009   BIPOLAR DISORDER UNSPECIFIED 05/17/2009   Hyperlipidemia 07/15/2007    PCP: Beatrice Lecher  REFERRING PROVIDER: [name]  REFERRING DIAG: [phone] (ICD-10-CM) - Recurrent pain of right knee M79.671,G89.29 (ICD-10-CM) - Chronic pain of right heel M76.61 (ICD-10-CM) - Achilles tendinitis of right lower extremity   THERAPY DIAG:  Recurrent pain of right knee  Chronic pain of right heel  Achilles tendinitis of right lower extremity  Muscle weakness (generalized)  Other abnormalities of gait and mobility  Rationale for Evaluation and Treatment Rehabilitation  ONSET DATE: March  2023  SUBJECTIVE:   SUBJECTIVE STATEMENT: Pt reports she has weaned herself completely out of boot. Still wearing heel lifts.   PERTINENT HISTORY: hx brainstem infarct and has significant gait difficulty with vertigo and ataxia   PAIN:  Are you having pain? No, 0/10 today. Are you having pain? No,  0/10 in knee today at start of session  PRECAUTIONS: None; cleared by Dr. Georgina Snell to come out of boot  PATIENT GOALS Improve pain and mobility   OBJECTIVE:   FOTO score 06/04/22: 76  TREATMENT 06/04/22 Therex:  Nu-step L5 x 5 min LEs/UEs  Standing gastroc and then soleus stretch with foot propped on steps 2x30 sec  Mini squat 2x10  Heel/toe raises 2x10  Forward step up 2x10    05/31/22 Therex: Nustep L5 x 5.5 minutes LEs only Standing Gastroc stretch -- had to back off and do less ROM for R side due to discomfort Ankle PF 2x10 green TB R and L Ankle DF 2x10 green TB R and L Knee ext green TB 2x10 R and L Knee flexion green TB 2x10 R and L Seated hip adductor squeeze with ball x 15 reps x 1 set Manual therapy:  STM & TPR gastroc/soleus, plantar surface of the R foot with P/Rom overstretch great toe extension Modalities: Iontophoresis:  R heel with dexamethasone x 4 hour patch No pain at end of session   05/28/22 Therex: Nustep L5 x 5 mines UEs/LEs Seated Gastroc stretch with strap 2x30 sec Ankle PF 2x10 green TB Ankle DF 2x10 green TB Knee ext green TB 2x10 Knee flexion green TB 2x10 Manual therapy:  STM & TPR gastroc/soleus Modalities: Iontophoresis:  R heel with dexamethasone x 4 hour patch,     Not from today's treatment/  from initial evaluation LOWER EXTREMITY AROM:  Active ROM Right eval Left eval  Hip flexion    Hip extension    Hip abduction    Hip adduction    Hip internal rotation    Hip external rotation    Knee flexion 105   Knee extension    Ankle dorsiflexion 2   Ankle plantarflexion 33   Ankle inversion 18   Ankle eversion 20     (Blank rows = not tested)  LOWER EXTREMITY MMT:  MMT Right eval Left eval  Hip flexion 3+/5 4/5  Hip extension    Hip abduction    Hip adduction    Hip internal rotation    Hip external rotation    Knee flexion 4-/5 limited by pain 5/5  Knee extension 4-/5 limited by pain 5/5  Ankle dorsiflexion 3+/5 limited by pain   Ankle plantarflexion 3+/5 limited by pain   Ankle inversion 4/5   Ankle eversion 4/5    (Blank rows = not tested)   PATIENT EDUCATION:  Education details: Weaning out of heel lifts Person educated: Patient Education method: Explanation and  Handouts Education comprehension: verbalized understanding and needs further education   HOME EXERCISE PROGRAM: Access Code: [email or web address] URL: [email or web address] Date: 05/28/2022 Prepared by: Estill Bamberg April Thurnell Garbe  Exercises - Seated Toe Curl  - 1 x daily - 7 x weekly - 2 sets - 10 reps - Toe Spreading  - 1 x daily - 7 x weekly - 2 sets - 10 reps - Toe Yoga - Alternating Great Toe and Lesser Toe Extension  - 1 x daily - 7 x weekly - 2 sets - 10 reps - Seated Calf Stretch with Strap  - 1 x daily - 7 x weekly - 3 sets - 30 sec hold - Seated Heel Slide  - 1 x daily - 7 x weekly - 2 sets - 10 reps - Ankle Inversion Eversion Towel Slide  - 1 x daily - 7 x weekly - 2 sets - 10 reps - Seated Heel Toe Raises  - 1 x daily - 7 x weekly - 2 sets - 10 reps - Seated Knee Extension with Resistance  - 1 x daily - 7 x weekly - 2 sets - 10 reps - Seated Hamstring Curls with Resistance  - 1 x daily - 7 x weekly - 2 sets - 10 reps - Seated Ankle Plantarflexion with Resistance  - 1 x daily - 7 x weekly - 2 sets - 10 reps - CLX Ankle Dorsiflexion and Eversion  - 1 x daily - 7 x weekly - 2 sets - 10 reps   ASSESSMENT:  CLINICAL IMPRESSION: Continued to progress pt's strengthening. Able to tolerate increased standing exercises this session. Pt reports good improvements and feels close to d/c. Requested shorter session  today. Continued mild tenderness on palpation of Achilles tendon insertion. Pt is to slowly wean herself out of heel lift. She has been meeting her LTGs. Deferred ionto this session.    OBJECTIVE IMPAIRMENTS Abnormal gait, decreased balance, decreased mobility, difficulty walking, decreased ROM, decreased strength, increased edema, increased fascial restrictions, increased muscle spasms, and pain.   ACTIVITY LIMITATIONS lifting, bending, standing, squatting, sleeping, stairs, transfers, and locomotion level  PARTICIPATION LIMITATIONS: meal prep, cleaning, laundry, driving, shopping, and community activity  PERSONAL FACTORS Age, Past/current experiences, and 1 comorbidity: Brainstem CVA affecting R side  are also affecting patient's functional outcome.   REHAB POTENTIAL: Good  CLINICAL DECISION MAKING: Evolving/moderate complexity  EVALUATION COMPLEXITY: Moderate   GOALS: Goals reviewed with patient? Yes  SHORT TERM GOALS: Target date: 05/30/2022  Pt will be ind with initial HEP Baseline: Goal status: ACHIEVED  2.  Pt will be able to tolerate ankle PROM in all directions with </=4/10 pain  Baseline:  Goal status: ACHIEVED-- only pain is direct pressure on achilles tendon R ankle    LONG TERM GOALS: Target date: 06/20/2022   Pt will be ind with advanced HEP Baseline:  Goal status: IN PROGRESS  2.  Pt will demo at least 10 deg R ankle DF for amb and stair ascent/descent Baseline:  Goal status: IN PROGRESS  3.  Pt will demo at least 4/5 R LE strength for improved t/fs and stability Baseline:  Goal status: IN PROGRESS  4.  Pt will have increased FOTO score of 45 Baseline: 76 on 06/04/22 Goal status: MET  5.  Pt will be able to amb at least 1000' ind with reciprocal gait pattern for improved community amb and </=2/10 pain Baseline:  Goal status: IN PROGRESS    PLAN: PT FREQUENCY: 2x/week  PT DURATION: 6 weeks  PLANNED INTERVENTIONS: Therapeutic exercises,  Therapeutic activity, Neuromuscular re-education, Balance training, Gait training, Patient/Family education, Self Care, Joint mobilization, Stair training, Orthotic/Fit training, DME instructions, Aquatic Therapy, Dry Needling, Electrical stimulation, Cryotherapy, Moist heat, Taping, Ionotophoresis 21m/ml Dexamethasone, and Manual therapy  PLAN FOR NEXT SESSION: Continue LE strengthening, STM and TPR of gastroc/soleus (consider TPDN).    Gellen April Ma L [place], PT, DPT 06/04/2022, 10:24 AM

## 2022-06-05 ENCOUNTER — Telehealth: Payer: Medicare Other

## 2022-06-05 ENCOUNTER — Telehealth: Payer: Self-pay | Admitting: Pharmacist

## 2022-06-05 NOTE — Progress Notes (Deleted)
Chronic Care Management Pharmacy Note  06/05/2022 Name:  [name] MRN:  [id number] DOB:  October 21, 1955  Summary: addressed HTN, HLD. During medication reconciliation, it was discovered patient is taking duplicate of:  - ARBs (olmesartan + losartan)  Patient states blood pressure at home runs ~ SBP 150-160 if stressed or busy, and occasionally as low as SBP 130s.   Recommendations/Changes made from today's visit:  - Recommend stop amlodipine/olmesartan 10-'40mg'$  combination - Recommend send RX for solely amlodipine '10mg'$  daily - Recommend increase clonidine patch to 0.'3mg'$  weekly  Plan: f/u with pharmacist in 4-5 months   Subjective: [name] is an 66 y.o. year old female who is a primary patient of [name], Rene Kocher, MD.  The CCM team was consulted for assistance with disease management and care coordination needs.    Engaged with patient by telephone for follow up visit in response to provider referral for pharmacy case management and/or care coordination services.   Consent to Services:  The patient was given information about Chronic Care Management services, agreed to services, and gave verbal consent prior to initiation of services.  Please see initial visit note for detailed documentation.   Patient Care Team: Hali Marry, MD as PCP - General Stanford Breed Denice Bors, MD as PCP - Cardiology (Cardiology) [name], [facility] as Pharmacist (Pharmacist) Lelon Perla, MD as Consulting Physician (Cardiology) Lelon Perla, MD as Consulting Physician (Cardiology)  Recent office visits:  01/23/21 Beatrice Lecher MD - Seen for follow up hypertension.   12/07/20 Beatrice Lecher, MD - Seen for hospital follow up. Xrays done. Return in 6 weeks.    Recent consult visits:  03/21/21 Kirk Ruths MD Cardiology - Seen for follow up hypertension.Labs ordered. Follow up in 6 months.   03/19/21 Cammy Brochure Neurology - Seen for history of stoke. Follow up in 4  months. Return in 3 months.   01/10/21 [name], MD Sports Medicine - Seen for left ankle pain. No notes available.   12/14/20 Cammy Brochure Neurology - Seen for history of stroke. Follow up in 3 months.   03/03/2 [name], MD Sports medicine - Seen for bilateral chronic knee pain. Xrays done. Prescription for walker given. Return in 2 weeks.  Hospital visits:  Admitted to the hospital on 11/21/20 due to Hypertension. Discharge date was 11/25/20. Discharged from [facility].     Medication Changes at [facility] Discharge:?? -started plavix 75 mg due to stroke, stopped clonidine 0.'2mg'$ , no other changes  Objective:  Lab Results  Component Value Date   CREATININE 0.91 04/01/2022   CREATININE 0.81 09/24/2021   CREATININE 0.82 03/28/2021    Lab Results  Component Value Date   HGBA1C 5.6 04/14/2017       Component Value Date/Time   CHOL 132 03/28/2021 0841   TRIG 54 03/28/2021 0841   HDL 52 03/28/2021 0841   CHOLHDL 2.5 03/28/2021 0841   VLDL 19 04/14/2017 0959   LDLCALC 67 03/28/2021 0841       Latest Ref Rng & Units 03/28/2021    8:41 AM 12/03/2019    9:06 AM 02/11/2018   11:33 AM  Hepatic Function  Total Protein 6.1 - 8.1 g/dL 6.7  6.0  7.3   AST 10 - 35 U/L '17  14  17   '$ ALT 6 - 29 U/L '14  9  11   '$ Total Bilirubin 0.2 - 1.2 mg/dL 0.7  0.7  0.6     Lab Results  Component  Value Date/Time   TSH 0.75 04/14/2017 09:59 AM   TSH 0.676 12/21/2014 10:59 AM       Latest Ref Rng & Units 02/11/2018   11:33 AM 04/14/2017    9:59 AM 12/21/2014   10:59 AM  CBC  WBC 3.8 - 10.8 Thousand/uL 8.9  7.1  8.3   Hemoglobin 11.7 - 15.5 g/dL 14.0  12.4  14.6   Hematocrit 35.0 - 45.0 % 41.5  39.2  43.6   Platelets 140 - 400 Thousand/uL 254  228  218      Social History   Tobacco Use  Smoking Status Former   Types: Cigarettes   Quit date: 10/08/2003   Years since quitting: 18.6  Smokeless Tobacco Never   BP Readings from Last 3 Encounters:  05/02/22 110/78  04/04/22  119/79  03/13/22 110/70   Pulse Readings from Last 3 Encounters:  05/02/22 72  04/04/22 60  03/13/22 67   Wt Readings from Last 3 Encounters:  05/02/22 180 lb 12.8 oz (82 kg)  03/13/22 201 lb (91.2 kg)  12/28/21 203 lb (92.1 kg)    Assessment: Review of patient past medical history, allergies, medications, health status, including review of consultants reports, laboratory and other test data, was performed as part of comprehensive evaluation and provision of chronic care management services.   SDOH:  (Social Determinants of Health) assessments and interventions performed:    CCM Care Plan  Allergies  Allergen Reactions   Codeine Nausea And Vomiting and Nausea Only    Medications Reviewed Today     Reviewed by Bubba Hales April Ma L, PT (Physical Therapist) on 06/04/22 at 1023  Med List Status: <None>   Medication Order Taking? Sig Documenting Provider Last Dose Status Informant  AMBULATORY NON FORMULARY MEDICATION [id number] No Walker. Use as needed. Disp 1.  Unable gait R26.81 [name], MD Taking Active   amitriptyline (ELAVIL) 10 MG tablet [id number] No Take 2 tablets by mouth every evening Hali Marry, MD Taking Active   amLODipine-olmesartan (AZOR) 10-40 MG tablet [id number] No Take 1 tablet by mouth every day. Lelon Perla, MD Taking Active   ASPIRIN LOW DOSE 81 MG tablet [id number] No Take 1 tablet by mouth every day Lelon Perla, MD Taking Active   atorvastatin (LIPITOR) 80 MG tablet [id number] No Take 1 tablet by mouth daily Stanford Breed, Denice Bors, MD Taking Active   cloNIDine (CATAPRES - DOSED IN MG/24 HR) 0.1 mg/24hr patch [id number] No Place 1 patch on the skin once a week Lelon Perla, MD Taking Active   cloNIDine (CATAPRES - DOSED IN MG/24 HR) 0.3 mg/24hr patch [id number] No Place 1 patch (0.3 mg total) onto the skin once a week. Hali Marry, MD Taking Active   clopidogrel (PLAVIX) 75 MG tablet [id number] No Take 1 tablet by mouth once  daily Hali Marry, MD Taking Active   diclofenac Sodium (VOLTAREN) 1 % GEL [id number] No Apply 4 grams to the skin four times a day Hali Marry, MD Taking Active   DULoxetine (CYMBALTA) 60 MG capsule [id number] No Take 1 capsule by mouth every evening Hali Marry, MD Taking Active   esomeprazole (NEXIUM) 40 MG capsule [id number] No Take by mouth. [provider] Taking Active   finasteride (PROPECIA) 1 MG tablet [id number] No Take by mouth. [provider] Taking Active   furosemide (LASIX) 40 MG tablet [id number]  Take 1 tablet by mouth twice daily Crenshaw, Denice Bors, MD  Active  gabapentin (NEURONTIN) 800 MG tablet [id number] No Take 1 tablet by mouth 3 times a day Hali Marry, MD Taking Active   hydrALAZINE (APRESOLINE) 100 MG tablet [id number] No Take 1 tablet (100 mg total) by mouth 3 (three) times daily. Lelon Perla, MD Taking Active   labetalol (NORMODYNE) 300 MG tablet [id number] No Take 2 tablets by mouth twice daily Lelon Perla, MD Taking Active   lidocaine (XYLOCAINE) 5 % ointment [id number] No Apply topically. [provider] Taking Active   metoprolol tartrate (LOPRESSOR) 50 MG tablet [id number] No TAKE 2 HOURS PRIOR TO CT SCAN Crenshaw, Denice Bors, MD Taking Active   MINOXIDIL, TOPICAL, 5 % SOLN [id number] No Apply 1 application topically daily. Hali Marry, MD Taking Active   Multiple Vitamins-Minerals (MULTIVITAMINS THER. W/MINERALS) TABS [id number] No Take 1 tablet by mouth daily. [provider] Taking Active   spironolactone (ALDACTONE) 50 MG tablet [id number] No Take 1 tablet by mouth every day Lelon Perla, MD Taking Active   traZODone (DESYREL) 50 MG tablet [id number] No Take 50 mg by mouth at bedtime. [provider] Taking Active   VENTOLIN HFA 108 (90 BASE) MCG/ACT inhaler [id number] No prn [provider] Taking Active   vitamin B-12 (CYANOCOBALAMIN) 500 MCG tablet [id number]  No Take by mouth. 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   Narcolepsy 03/13/2013   Coronary atherosclerosis 03/13/2013   DDD (degenerative disc disease), lumbar    HIRSUTISM 06/02/2009   BIPOLAR DISORDER UNSPECIFIED 05/17/2009   Hyperlipidemia 07/15/2007    Immunization History  Administered Date(s) Administered   Influenza Split 06/27/2011   Influenza Whole 07/15/2007   Influenza,inj,Quad PF,6+ Mos 07/31/2016   Janssen (J&J) SARS-COV-2 Vaccination 02/04/2020   PFIZER(Purple Top)SARS-COV-2 Vaccination 11/07/2020   Tdap 12/21/2014    Conditions to be addressed/monitored: HTN and HLD  There are no care plans that you recently modified to display for this patient.      Medication Assistance: None required.  Patient affirms current coverage meets needs.  Patient's preferred pharmacy is:  [facility], [place] [address] Phone: [phone] Fax: [place], [place] [address] [place] [address] Phone: [phone] Fax: [phone]   Uses pill box? No - keeps in original bottles & states works well for her. Keeps patch exchange days written on the calendar Pt endorses 100% compliance  Follow Up:  Patient agrees to Care Plan and Follow-up.  Plan:  Telephone follow up appointment with care management team member scheduled for:  1 month  Larinda Buttery, PharmD Clinical Pharmacist [facility] [phone]

## 2022-06-05 NOTE — Progress Notes (Unsigned)
   I, [name], LAT, ATC acting as a scribe for [name], MD.  [name] is a 66 y.o. female who presents to [place] at [facility] today for f/u R Achilles tendonopathy exacerbated by a plantar wart on her L foot and R knee pain. Pt was last seen by Dr. Georgina Snell on 05/02/22 and was given a R knee steroid injection and advised to immobilize R foot w/ a CAM walker boot. Pt was also referred to PT, completing 8 visits. Today, pt reports  Dx imaging: 12/07/20 R knee XR  06/16/19 R knee XR  Pertinent review of systems: ***  Relevant historical information: ***   Exam:  There were no vitals taken for this visit. General: Well Developed, well nourished, and in no acute distress.   MSK: ***    Lab and Radiology Results No results found for this or any previous visit (from the past 72 hour(s)). No results found.     Assessment and Plan: 66 y.o. female with ***   PDMP not reviewed this encounter. No orders of the defined types were placed in this encounter.  No orders of the defined types were placed in this encounter.    Discussed warning signs or symptoms. Please see discharge instructions. Patient expresses understanding.   ***

## 2022-06-05 NOTE — Telephone Encounter (Signed)
Unsuccessful attempt x3 to contact patient for follow-up phone call for CCM services with pharmacist. Left voicemail, attempted to call both numbers listed in patient dashboard, as well as [phone] as listed from scheduler notes.  Will route to schedule team for reschedule.  Larinda Buttery, PharmD Clinical Pharmacist [facility] At [facility] [phone]

## 2022-06-06 ENCOUNTER — Ambulatory Visit: Payer: Self-pay

## 2022-06-06 ENCOUNTER — Ambulatory Visit (INDEPENDENT_AMBULATORY_CARE_PROVIDER_SITE_OTHER): Payer: Medicare Other | Admitting: Family Medicine

## 2022-06-06 VITALS — BP 118/82 | HR 83 | Ht 66.0 in | Wt 191.0 lb

## 2022-06-06 DIAGNOSIS — M79672 Pain in left foot: Secondary | ICD-10-CM | POA: Diagnosis not present

## 2022-06-06 DIAGNOSIS — M25561 Pain in right knee: Secondary | ICD-10-CM | POA: Diagnosis not present

## 2022-06-06 DIAGNOSIS — M7661 Achilles tendinitis, right leg: Secondary | ICD-10-CM | POA: Diagnosis not present

## 2022-06-06 MED ORDER — NITROGLYCERIN 0.2 MG/HR TD PT24: MEDICATED_PATCH | TRANSDERMAL | 1 refills | Status: DC

## 2022-06-06 NOTE — Patient Instructions (Addendum)
Thank you for coming in today.   Continue the home exercises.   Recheck in 2 months.   Let me know if you are not getting better.

## 2022-06-06 NOTE — Chronic Care Management (AMB) (Signed)
  Chronic Care Management Note  06/06/2022 Name: [name] MRN: [id number] DOB: April 21, 1956  [name] is a 66 y.o. year old female who is a primary care patient of Hali Marry, MD and is actively engaged with the care management team. I reached out to [name] by phone today to assist with re-scheduling a follow up visit with the Pharmacist  Follow up plan: We have been unable to make contact with the patient for follow up. The care management team is available to follow up with the patient after provider conversation with the patient regarding recommendation for care management engagement and subsequent re-referral to the care management team.   Julian Hy, [place] Direct Dial: [phone]

## 2022-06-07 ENCOUNTER — Ambulatory Visit: Payer: Medicare Other | Attending: Family Medicine | Admitting: Physical Therapy

## 2022-06-07 DIAGNOSIS — R269 Unspecified abnormalities of gait and mobility: Secondary | ICD-10-CM | POA: Insufficient documentation

## 2022-06-07 DIAGNOSIS — G8929 Other chronic pain: Secondary | ICD-10-CM | POA: Insufficient documentation

## 2022-06-07 DIAGNOSIS — M7661 Achilles tendinitis, right leg: Secondary | ICD-10-CM | POA: Insufficient documentation

## 2022-06-07 DIAGNOSIS — M79671 Pain in right foot: Secondary | ICD-10-CM | POA: Insufficient documentation

## 2022-06-07 DIAGNOSIS — Z8673 Personal history of transient ischemic attack (TIA), and cerebral infarction without residual deficits: Secondary | ICD-10-CM | POA: Insufficient documentation

## 2022-06-07 DIAGNOSIS — M62838 Other muscle spasm: Secondary | ICD-10-CM | POA: Insufficient documentation

## 2022-06-07 DIAGNOSIS — R262 Difficulty in walking, not elsewhere classified: Secondary | ICD-10-CM | POA: Insufficient documentation

## 2022-07-25 DIAGNOSIS — I509 Heart failure, unspecified: Secondary | ICD-10-CM | POA: Diagnosis not present

## 2022-07-25 DIAGNOSIS — Z8673 Personal history of transient ischemic attack (TIA), and cerebral infarction without residual deficits: Secondary | ICD-10-CM | POA: Diagnosis not present

## 2022-07-25 DIAGNOSIS — E1169 Type 2 diabetes mellitus with other specified complication: Secondary | ICD-10-CM | POA: Diagnosis not present

## 2022-07-27 ENCOUNTER — Other Ambulatory Visit: Payer: Self-pay | Admitting: Family Medicine

## 2022-08-08 ENCOUNTER — Ambulatory Visit (INDEPENDENT_AMBULATORY_CARE_PROVIDER_SITE_OTHER): Payer: Medicare Other | Admitting: Family Medicine

## 2022-08-08 VITALS — BP 132/84 | HR 68 | Ht 66.0 in | Wt 193.0 lb

## 2022-08-08 DIAGNOSIS — M7661 Achilles tendinitis, right leg: Secondary | ICD-10-CM

## 2022-08-08 DIAGNOSIS — B070 Plantar wart: Secondary | ICD-10-CM

## 2022-08-08 NOTE — Patient Instructions (Signed)
Thank you for coming in today.   I have referred you to a podiatrist for the plantar wart.   Continue the nitro patches for the achillies tendon.

## 2022-08-08 NOTE — Progress Notes (Signed)
   I, [name], LAT, ATC acting as a scribe for [name], MD.  [name] is a 66 y.o. female who presents to [place] at [facility] today for f/u R chronic calcific Achilles tendonopathy exacerbated by a plantar wart on her L foot and R knee pain. Pt was last seen by Dr. Georgina Snell on 06/06/22 and was prescribed nitro patches. Pt's last R knee steroid injection was on 05/02/22. Today, pt reports the plantars wart on her L heel is very painful and "puss" is coming out of the area. Pt notes cont'd swelling in her R Achilles, but it is feeling a bit better.   Dx imaging: 12/07/20 R knee XR             06/16/19 R knee XR  Pertinent review of systems: No fevers or chills  Relevant historical information: Heart failure.  Amyloid.   Exam:  BP 132/84   Pulse 68   Ht '5\' 6"'$  (1.676 m)   Wt 193 lb (87.5 kg)   SpO2 100%   BMI 31.15 kg/m  General: Well Developed, well nourished, and in no acute distress.   MSK: Left plantar calcaneus large plantar wart is visible and tender to palpation.  Right heel tender palpation posterior calcaneus.  Normal foot and ankle motion.      Assessment and Plan: 66 y.o. female with right distal Achilles tendinopathy improving with nitroglycerin patch protocol and eccentric exercise.  Plan to continue current regimen and recheck back in 1-2 months.  Left plantar wart.  This is the most dominant issue today.  She is tried conservative management including offloading pressure pads.  She is ready to consider surgery for this.  Refer to podiatry.   PDMP not reviewed this encounter. Orders Placed This Encounter  Procedures   Ambulatory referral to Podiatry    Referral Priority:   Routine    Referral Type:   Consultation    Referral Reason:   Specialty Services Required    Requested Specialty:   Podiatry    Number of Visits Requested:   1   No orders of the defined types were placed in this encounter.    Discussed warning signs or  symptoms. Please see discharge instructions. Patient expresses understanding.   The above documentation has been reviewed and is accurate and complete [name], M.D.

## 2022-08-16 ENCOUNTER — Encounter: Payer: Self-pay | Admitting: Podiatry

## 2022-08-16 ENCOUNTER — Ambulatory Visit (INDEPENDENT_AMBULATORY_CARE_PROVIDER_SITE_OTHER): Payer: Medicare Other | Admitting: Podiatry

## 2022-08-16 DIAGNOSIS — I10 Essential (primary) hypertension: Secondary | ICD-10-CM | POA: Insufficient documentation

## 2022-08-16 DIAGNOSIS — L989 Disorder of the skin and subcutaneous tissue, unspecified: Secondary | ICD-10-CM | POA: Diagnosis not present

## 2022-08-16 DIAGNOSIS — E669 Obesity, unspecified: Secondary | ICD-10-CM | POA: Insufficient documentation

## 2022-08-16 DIAGNOSIS — Z01818 Encounter for other preprocedural examination: Secondary | ICD-10-CM | POA: Diagnosis not present

## 2022-08-16 DIAGNOSIS — M778 Other enthesopathies, not elsewhere classified: Secondary | ICD-10-CM | POA: Diagnosis not present

## 2022-08-16 NOTE — Progress Notes (Signed)
Subjective:  Patient ID: [name], female    DOB: Feb 14, 1956,  MRN: [id number]  Chief Complaint  Patient presents with   porokeratosis     Left heel painful place. PT stated that she is on blood thinners has a history of a stoke and other health conditions but would like to have the place on her heel treated. Has saw Dr.Sikora back in February for this issue.     66 y.o. female presents with the above complaint. Patient presents with left heel benign skin lesion that is ongoing for quite some time is progressive gotten worse.  She states she has been treated by Dr. Blenda Mounts conservatively and it keeps coming back and is causing her more pain to the point where she is compensating the part of her feet side is hurting.  She would like to discuss surgical excision of the lesion.  She denies any other acute complaints.  She has tried all conservative treatment options and has failed.   Review of Systems: Negative except as noted in the HPI. Denies N/V/F/Ch.  Past Medical History:  Diagnosis Date   Allergy    Anemia    Anxiety    Bipolar disorder (HCC)    CHF (congestive heart failure) (HCC)    Chronic diastolic heart failure ([place])    hospitalized at [facility] regional   Closed nondisplaced fracture of phalanx of toe of left foot 12/09/3612   Complication of anesthesia    had "breathing problems and ended up in ICU" 03/2010 report on chart   COPD (chronic obstructive pulmonary disease) ([place])    DDD (degenerative disc disease), lumbar    Depression    Diabetes in pregnancy    diet cnontrolled   GERD (gastroesophageal reflux disease)    Glucose intolerance (impaired glucose tolerance)    HLD (hyperlipidemia)    nec/nos   HTN (hypertension)    benign essential   MVP (mitral valve prolapse)    Narcolepsy    Stroke ([place])    Suicide attempt ([place])    hx   Syncope    admx to [facility] in 2/12: Echo with normal LVF; head CT unremarkable, ECG stable, no further w/u   Ulcer     Current  Outpatient Medications:    oxyCODONE-acetaminophen (PERCOCET/ROXICET) 5-325 MG tablet, Take 1 tablet by mouth every 6 (six) hours as needed., Disp: , Rfl:    AMBULATORY NON FORMULARY MEDICATION, Walker. Use as needed. Disp 1.  Unable gait R26.81, Disp: 1 each, Rfl: 0   amitriptyline (ELAVIL) 10 MG tablet, Take 2 tablets by mouth every evening, Disp: 60 tablet, Rfl: 11   amLODipine-olmesartan (AZOR) 10-40 MG tablet, Take 1 tablet by mouth every day., Disp: 30 tablet, Rfl: 11   ASPIRIN LOW DOSE 81 MG tablet, Take 1 tablet by mouth every day, Disp: 30 tablet, Rfl: 11   atorvastatin (LIPITOR) 80 MG tablet, Take 1 tablet by mouth daily, Disp: 30 tablet, Rfl: 11   busPIRone (BUSPAR) 10 MG tablet, Take 1 tablet by mouth 3 times a day, Disp: 90 tablet, Rfl: 11   cloNIDine (CATAPRES - DOSED IN MG/24 HR) 0.1 mg/24hr patch, Place 1 patch on the skin once a week, Disp: 4 patch, Rfl: 11   cloNIDine (CATAPRES - DOSED IN MG/24 HR) 0.3 mg/24hr patch, Place 1 patch (0.3 mg total) onto the skin once a week., Disp: 12 patch, Rfl: 1   clopidogrel (PLAVIX) 75 MG tablet, Take 1 tablet by mouth once daily, Disp: 90 tablet,  Rfl: 0   diclofenac Sodium (VOLTAREN) 1 % GEL, Apply 4 grams to the skin four times a day, Disp: 500 g, Rfl: 11   DULoxetine (CYMBALTA) 60 MG capsule, Take 1 capsule by mouth every evening, Disp: 30 capsule, Rfl: 11   esomeprazole (NEXIUM) 40 MG capsule, Take by mouth., Disp: , Rfl:    finasteride (PROPECIA) 1 MG tablet, Take by mouth., Disp: , Rfl:    furosemide (LASIX) 40 MG tablet, Take 1 tablet by mouth twice daily, Disp: 60 tablet, Rfl: 11   gabapentin (NEURONTIN) 800 MG tablet, Take 1 tablet by mouth 3 times a day, Disp: 90 tablet, Rfl: 11   hydrALAZINE (APRESOLINE) 100 MG tablet, Take 1 tablet (100 mg total) by mouth 3 (three) times daily., Disp: 90 tablet, Rfl: 11   labetalol (NORMODYNE) 300 MG tablet, Take 2 tablets by mouth twice daily, Disp: 120 tablet, Rfl: 11   lidocaine (XYLOCAINE) 5 %  ointment, Apply topically., Disp: , Rfl:    metoprolol tartrate (LOPRESSOR) 50 MG tablet, TAKE 2 HOURS PRIOR TO CT SCAN, Disp: 1 tablet, Rfl: 0   MINOXIDIL, TOPICAL, 5 % SOLN, Apply 1 application topically daily., Disp: 120 mL, Rfl: PRN   Multiple Vitamins-Minerals (MULTIVITAMINS THER. W/MINERALS) TABS, Take 1 tablet by mouth daily., Disp: , Rfl:    nitroGLYCERIN (NITRODUR - DOSED IN MG/24 HR) 0.2 mg/hr patch, Apply 1/4 patch daily to tendon for tendonitis., Disp: 30 patch, Rfl: 1   spironolactone (ALDACTONE) 50 MG tablet, Take 1 tablet by mouth every day, Disp: 30 tablet, Rfl: 11   traZODone (DESYREL) 50 MG tablet, Take 50 mg by mouth at bedtime., Disp: , Rfl:    VENTOLIN HFA 108 (90 BASE) MCG/ACT inhaler, prn, Disp: , Rfl:    vitamin B-12 (CYANOCOBALAMIN) 500 MCG tablet, Take by mouth., Disp: , Rfl:   Social History   Tobacco Use  Smoking Status Former   Types: Cigarettes   Quit date: 10/08/2003   Years since quitting: 18.8  Smokeless Tobacco Never    Allergies  Allergen Reactions   Codeine Nausea And Vomiting and Nausea Only   Objective:  There were no vitals filed for this visit. There is no height or weight on file to calculate BMI. Constitutional Well developed. Well nourished.  Vascular Dorsalis pedis pulses palpable bilaterally. Posterior tibial pulses palpable bilaterally. Capillary refill normal to all digits.  No cyanosis or clubbing noted. Pedal hair growth normal.  Neurologic Normal speech. Oriented to person, place, and time. Epicritic sensation to light touch grossly present bilaterally.  Dermatologic Left healed benign skin lesion with central nucleated core noted.  Pain on palpation to the lesion.  Pain with ambulation  Orthopedic: Normal joint ROM without pain or crepitus bilaterally. No visible deformities. No bony tenderness.   Radiographs: None Assessment:   1. Capsulitis of left foot   2. Benign skin lesion   3. Encounter for preoperative examination  for general surgical procedure    Plan:  Patient was evaluated and treated and all questions answered.  Left heel benign skin lesion/porokeratosis with underlying capsulitis -All questions and concerns were discussed with the patient in extensive detail.  I discussed with her that patient will benefit from wide excision of the lesion in order to remove it in its entirety.  I discussed my preoperative intraoperative postoperative plan in extensive detail she states understand like to proceed with surgery.  She will also need to be nonweightbearing to the left lower extremity given the placement of the incision.  She states understand will obtain a knee scooter. -Informed surgical risk consent was reviewed and read aloud to the patient.  I reviewed the films.  I have discussed my findings with the patient in great detail.  I have discussed all risks including but not limited to infection, stiffness, scarring, limp, disability, deformity, damage to blood vessels and nerves, numbness, poor healing, need for braces, arthritis, chronic pain, amputation, death.  All benefits and realistic expectations discussed in great detail.  I have made no promises as to the outcome.  I have provided realistic expectations.  I have offered the patient a 2nd opinion, which they have declined and assured me they preferred to proceed despite the risks -Interim patient will benefit from steroid injection to decrease the underlying capsulitis patient states understand like to proceed with steroid injection -A steroid injection was performed at left heel using 1% plain Lidocaine and 10 mg of Kenalog. This was well tolerated. -She is a controlled diabetic with last A1c of 5.6%   No follow-ups on file.

## 2022-08-22 ENCOUNTER — Other Ambulatory Visit: Payer: Self-pay | Admitting: Family Medicine

## 2022-08-23 ENCOUNTER — Telehealth: Payer: Self-pay | Admitting: Podiatry

## 2022-08-23 NOTE — Telephone Encounter (Signed)
DOS: 09/23/2022  Medicare [place]. Benign Lesion Over 4.0 cm Lt ([id number])  DX: L98.9

## 2022-08-28 NOTE — Progress Notes (Signed)
HPI: FU hypertension. Previous renal Dopplers were technically difficult. There was no stenosis on the left and the right was not well visualized. Follow up CTA to rule out renal artery stenosis was done and was negative. Had CVA 2/22. CTA 2/22 showed no carotid stenosis. Monitor at [facility] 5/22 showed sinus with 10 and 11 beats NSVT. Echo 2/22 at [facility] showed normal LV function, neg saline microcavitation study, mild MR. Monitor April 2023 showed sinus rhythm with nonsustained ventricular tachycardia (longest 21 beats), PAT, PACs and PVCs.  Cardiac CTA June 2023 showed calcium score 158 which was 91st percentile, mild stenosis in the proximal LAD, minimal disease in the left main, ramus intermedius, circumflex, OM1.  Since last seen patient denies dyspnea, chest pain, palpitations, syncope or pedal edema.  Current Outpatient Medications  Medication Sig Dispense Refill   AMBULATORY NON FORMULARY MEDICATION Walker. Use as needed. Disp 1.  Unable gait R26.81 1 each 0   amLODipine-olmesartan (AZOR) 10-40 MG tablet Take 1 tablet by mouth every day. 30 tablet 11   ASPIRIN LOW DOSE 81 MG tablet Take 1 tablet by mouth every day 30 tablet 11   atorvastatin (LIPITOR) 80 MG tablet Take 1 tablet by mouth daily 30 tablet 11   busPIRone (BUSPAR) 10 MG tablet Take 1 tablet by mouth 3 times a day 90 tablet 11   cloNIDine (CATAPRES - DOSED IN MG/24 HR) 0.1 mg/24hr patch Place 1 patch on the skin once a week 4 patch 11   cloNIDine (CATAPRES - DOSED IN MG/24 HR) 0.3 mg/24hr patch Place 1 patch (0.3 mg total) onto the skin once a week. 12 patch 1   clopidogrel (PLAVIX) 75 MG tablet Take 1 tablet by mouth once daily 90 tablet 0   diclofenac Sodium (VOLTAREN) 1 % GEL Apply 4 grams to the skin four times a day 500 g 11   DULoxetine (CYMBALTA) 60 MG capsule Take 1 capsule by mouth every evening 30 capsule 11   esomeprazole (NEXIUM) 40 MG capsule Take 40 mg by mouth daily.     finasteride (PROPECIA) 1 MG tablet Take  by mouth.     furosemide (LASIX) 40 MG tablet Take 1 tablet by mouth twice daily 60 tablet 11   gabapentin (NEURONTIN) 800 MG tablet Take 1 tablet by mouth 3 times a day 90 tablet 11   hydrALAZINE (APRESOLINE) 100 MG tablet Take 1 tablet (100 mg total) by mouth 3 (three) times daily. 90 tablet 11   labetalol (NORMODYNE) 300 MG tablet Take 2 tablets by mouth twice daily 120 tablet 11   lidocaine (XYLOCAINE) 5 % ointment Apply 1 Application topically as needed for mild pain.     MINOXIDIL, TOPICAL, 5 % SOLN Apply 1 application topically daily. 120 mL PRN   Multiple Vitamins-Minerals (MULTIVITAMINS THER. W/MINERALS) TABS Take 1 tablet by mouth daily.     nitroGLYCERIN (NITRODUR - DOSED IN MG/24 HR) 0.2 mg/hr patch Apply 1/4 patch daily to tendon for tendonitis. 30 patch 1   spironolactone (ALDACTONE) 50 MG tablet Take 1 tablet by mouth every day 30 tablet 11   traZODone (DESYREL) 50 MG tablet Take 50 mg by mouth at bedtime.     VENTOLIN HFA 108 (90 BASE) MCG/ACT inhaler prn     vitamin B-12 (CYANOCOBALAMIN) 500 MCG tablet Take by mouth.     No current facility-administered medications for this visit.     Past Medical History:  Diagnosis Date   Allergy    Anemia  Anxiety    Bipolar disorder (HCC)    CHF (congestive heart failure) (HCC)    Chronic diastolic heart failure ([place])    hospitalized at [facility]   Closed nondisplaced fracture of phalanx of toe of left foot 11/11/8525   Complication of anesthesia    had "breathing problems and ended up in ICU" 03/2010 report on chart   COPD (chronic obstructive pulmonary disease) ([place])    DDD (degenerative disc disease), lumbar    Depression    Diabetes in pregnancy    diet cnontrolled   GERD (gastroesophageal reflux disease)    Glucose intolerance (impaired glucose tolerance)    HLD (hyperlipidemia)    nec/nos   HTN (hypertension)    benign essential   MVP (mitral valve prolapse)    Narcolepsy    Stroke ([place])    Suicide attempt ([place])     hx   Syncope    admx to [facility] in 2/12: Echo with normal LVF; head CT unremarkable, ECG stable, no further w/u   Ulcer     Past Surgical History:  Procedure Laterality Date   AUGMENTATION MAMMAPLASTY  2003   BLADDER SUSPENSION  2002   DENTAL SURGERY  2016   Teeth implants x 4   GASTRIC BYPASS  2014   at [place] MICRODISCECTOMY  09/11/2011   Procedure: LUMBAR LAMINECTOMY/DECOMPRESSION MICRODISCECTOMY;  Surgeon: Tobi Bastos;  Location: [facility];  Service: Orthopedics;  Laterality: Right;  Hemi Laminectomy/Microdiscectomy L5 - S1 on the Right (X-Ray)   PARTIAL HYSTERECTOMY  1979   REPAIR PERONEAL TENDONS ANKLE Left 2008   after injury     Social History   Socioeconomic History   Marital status: Married    Spouse name: Zanasia Hickson   Number of children: 2   Years of education: 13   Highest education level: Some college, no degree  Occupational History   Occupation: legally disabled  Tobacco Use   Smoking status: Former    Types: Cigarettes    Quit date: 10/08/2003    Years since quitting: 18.9   Smokeless tobacco: Never  Vaping Use   Vaping Use: Never used  Substance and Sexual Activity   Alcohol use: Yes    Comment: rare   Drug use: No   Sexual activity: Not on file  Other Topics Concern   Not on file  Social History Narrative   Lives with her spouse and her son. She has two children. She enjoys watching television. She is not able to do a lot of since her stroke in January, 2022.   Social Determinants of Health   Financial Resource Strain: Low Risk  (08/20/2021)   Overall Financial Resource Strain (CARDIA)    Difficulty of Paying Living Expenses: Not hard at all  Food Insecurity: No Food Insecurity (08/20/2021)   Hunger Vital Sign    Worried About Running Out of Food in the Last Year: Never true    Ran Out of Food in the Last Year: Never true  Transportation Needs: No  Transportation Needs (08/20/2021)   PRAPARE - Hydrologist (Medical): No    Lack of Transportation (Non-Medical): No  Physical Activity: Inactive (08/20/2021)   Exercise Vital Sign    Days of Exercise per Week: 0 days    Minutes of Exercise per Session: 0 min  Stress: No Stress Concern Present (08/20/2021)   [place] -  Occupational Stress Questionnaire    Feeling of Stress : Not at all  Social Connections: Moderately Isolated (08/20/2021)   Social Connection and Isolation Panel [NHANES]    Frequency of Communication with Friends and Family: More than three times a week    Frequency of Social Gatherings with Friends and Family: Once a week    Attends Religious Services: Never    Marine scientist or Organizations: No    Attends Archivist Meetings: Never    Marital Status: Married  Human resources officer Violence: Not At Risk (08/20/2021)   Humiliation, Afraid, Rape, and Kick questionnaire    Fear of Current or Ex-Partner: No    Emotionally Abused: No    Physically Abused: No    Sexually Abused: No    Family History  Problem Relation Age of Onset   Heart attack Father 3   Hypertension Father    Diabetes Mother    Hypertension Mother    Stroke Other        grandmother   Colon cancer Neg Hx    Esophageal cancer Neg Hx    Stomach cancer Neg Hx    Rectal cancer Neg Hx     ROS: Pain in foot from recent accident with dog but no fevers or chills, productive cough, hemoptysis, dysphasia, odynophagia, melena, hematochezia, dysuria, hematuria, rash, seizure activity, orthopnea, PND, pedal edema, claudication. Remaining systems are negative.  Physical Exam: Well-developed well-nourished in no acute distress.  Skin is warm and dry.  HEENT is normal.  Neck is supple.  Chest is clear to auscultation with normal expansion.  Cardiovascular exam is regular rate and rhythm.  Abdominal exam nontender or distended. No  masses palpated. Extremities show no edema. neuro grossly intact  A/P  1 coronary artery disease-mild on recent CTA.  Continue statin and aspirin.  2 history of chest pain-recent CTA showed mild disease.  No recurrent symptoms.  3 history of nonsustained ventricular tachycardia-continue beta-blocker.  LV function is normal on echocardiogram.  4 palpitations-continue beta-blocker.  5 hypertension-patient's blood pressure is controlled.  Continue present medical regimen.  Check potassium and renal function.  6 hyperlipidemia-continue statin.  Check lipids and liver.  7 prior CVA-continue aspirin and Plavix.  No history of documented atrial fibrillation.  Kirk Ruths, MD

## 2022-09-10 ENCOUNTER — Ambulatory Visit (INDEPENDENT_AMBULATORY_CARE_PROVIDER_SITE_OTHER): Payer: Medicare Other | Admitting: Family Medicine

## 2022-09-10 ENCOUNTER — Encounter: Payer: Self-pay | Admitting: Family Medicine

## 2022-09-10 VITALS — BP 116/69 | HR 78 | Ht 66.0 in | Wt 197.0 lb

## 2022-09-10 DIAGNOSIS — L723 Sebaceous cyst: Secondary | ICD-10-CM | POA: Diagnosis not present

## 2022-09-10 DIAGNOSIS — M25561 Pain in right knee: Secondary | ICD-10-CM | POA: Diagnosis not present

## 2022-09-10 NOTE — Progress Notes (Signed)
Acute Office Visit  Subjective:     Patient ID: [name], female    DOB: [birth year], 66 y.o.   MRN: [id number]  Chief Complaint  Patient presents with   ingrown hair    HPI Patient is in today for ingrown hair in the groin after waxing.  She says she noticed a bump on the right posterior labia.  Her daughter got her worried about it and she thought she might need HPV testing.  No drainage or discharge from the area.  In fact its not even painful or tender at all.  She also says that her dog was try to get out of his harness and clipped the right side of her knee.  She knows she has arthritis in that knee and in fact just had an injection done a couple of months ago with Dr. [name], sports medicine.  It was actually doing better until she got hit.  Its been more painful and swollen and really hurts to actually straighten her knee.  She heard and felt a pop that was painful when the injury occurred.  ROS      Objective:    BP 116/69 (BP Location: Left Arm, Patient Position: Sitting, Cuff Size: Large)   Pulse 78   Ht '5\' 6"'$  (1.676 m)   Wt 197 lb 0.6 oz (89.4 kg)   SpO2 96%   BMI 31.80 kg/m    Physical Exam Vitals reviewed.  Constitutional:      Appearance: She is well-developed.  HENT:     Head: Normocephalic and atraumatic.  Eyes:     Conjunctiva/sclera: Conjunctivae normal.  Cardiovascular:     Rate and Rhythm: Normal rate.  Pulmonary:     Effort: Pulmonary effort is normal.  Genitourinary:      Comments: Of the posterior right labia there is a small maybe 5 to 6 mm epidermal cyst that is blocked.  No drainage or erythema.  Using #15 blade to just make a small incision over the cyst and then expressed the contents.  Patient tolerated well. Musculoskeletal:     Comments: Knee with some anterior medial edema and upper lateral edema.  Tender over the lateral joint line.  Increased warmth.  Pain with full extension and full flexion.  Skin:    General: Skin is  dry.     Coloration: Skin is not pale.  Neurological:     Mental Status: She is alert and oriented to person, place, and time.  Psychiatric:        Behavior: Behavior normal.     No results found for any visits on 09/10/22.      Assessment & Plan:   Problem List Items Addressed This Visit   None Visit Diagnoses     Acute pain of right knee    -  Primary   Relevant Orders   DG Knee Complete 4 Views Right   Sebaceous cyst          Inflamed sebaceous cyst-right now there is no active drainage and it is not painful or tender we discussed the option of leaving it since it still fairly small probably 5 mm in size versus doing a small incision and expressing the contents.  She preferred to have the lesion incised.  Patient tolerated procedure well.  Expect some drainage and oozing from the area.  Right knee pain -worse with injury.  Difficult to extend the knee.  M concerned that she does not have full extension and  flexion so we will get x-ray today for further work-up.  May need to get back in with Dr. Bertram Millard.  Would like for her to schedule follow-up to address her diabetes.  No orders of the defined types were placed in this encounter.   No follow-ups on file.  Beatrice Lecher, MD

## 2022-09-11 ENCOUNTER — Ambulatory Visit (INDEPENDENT_AMBULATORY_CARE_PROVIDER_SITE_OTHER): Payer: Medicare Other | Admitting: Cardiology

## 2022-09-11 ENCOUNTER — Encounter: Payer: Self-pay | Admitting: Cardiology

## 2022-09-11 ENCOUNTER — Ambulatory Visit (INDEPENDENT_AMBULATORY_CARE_PROVIDER_SITE_OTHER): Payer: Medicare Other

## 2022-09-11 VITALS — BP 124/83 | HR 69 | Ht 66.0 in | Wt 198.4 lb

## 2022-09-11 DIAGNOSIS — I10 Essential (primary) hypertension: Secondary | ICD-10-CM

## 2022-09-11 DIAGNOSIS — M25561 Pain in right knee: Secondary | ICD-10-CM

## 2022-09-11 DIAGNOSIS — E7800 Pure hypercholesterolemia, unspecified: Secondary | ICD-10-CM

## 2022-09-11 DIAGNOSIS — G8929 Other chronic pain: Secondary | ICD-10-CM

## 2022-09-11 DIAGNOSIS — I2510 Atherosclerotic heart disease of native coronary artery without angina pectoris: Secondary | ICD-10-CM | POA: Diagnosis not present

## 2022-09-11 MED ORDER — ATORVASTATIN CALCIUM 80 MG PO TABS: 80.0000 mg | ORAL_TABLET | Freq: Every day | ORAL | 11 refills | Status: DC

## 2022-09-11 MED ORDER — CLONIDINE 0.3 MG/24HR TD PTWK: 0.3000 mg | MEDICATED_PATCH | TRANSDERMAL | 1 refills | Status: DC

## 2022-09-11 MED ORDER — SPIRONOLACTONE 50 MG PO TABS: 50.0000 mg | ORAL_TABLET | Freq: Every day | ORAL | 11 refills | Status: DC

## 2022-09-11 MED ORDER — CLOPIDOGREL BISULFATE 75 MG PO TABS: 75.0000 mg | ORAL_TABLET | Freq: Every day | ORAL | 3 refills | Status: DC

## 2022-09-11 MED ORDER — AMLODIPINE-OLMESARTAN 10-40 MG PO TABS: 1.0000 | ORAL_TABLET | Freq: Every day | ORAL | 11 refills | Status: DC

## 2022-09-11 MED ORDER — FUROSEMIDE 40 MG PO TABS: 40.0000 mg | ORAL_TABLET | Freq: Two times a day (BID) | ORAL | 11 refills | Status: DC

## 2022-09-11 MED ORDER — CLONIDINE 0.1 MG/24HR TD PTWK: MEDICATED_PATCH | TRANSDERMAL | 11 refills | Status: DC

## 2022-09-11 MED ORDER — NITROGLYCERIN 0.2 MG/HR TD PT24: MEDICATED_PATCH | TRANSDERMAL | 1 refills | Status: DC

## 2022-09-11 MED ORDER — HYDRALAZINE HCL 100 MG PO TABS: 100.0000 mg | ORAL_TABLET | Freq: Three times a day (TID) | ORAL | 11 refills | Status: DC

## 2022-09-11 NOTE — Patient Instructions (Signed)
  Follow-Up: At [facility], you and your health needs are our priority.  As part of our continuing mission to provide you with exceptional heart care, we have created designated Provider Care Teams.  These Care Teams include your primary Cardiologist (physician) and Advanced Practice Providers (APPs -  Physician Assistants and Nurse Practitioners) who all work together to provide you with the care you need, when you need it.  We recommend signing up for the patient portal called "MyChart".  Sign up information is provided on this After Visit Summary.  MyChart is used to connect with patients for Virtual Visits (Telemedicine).  Patients are able to view lab/test results, encounter notes, upcoming appointments, etc.  Non-urgent messages can be sent to your provider as well.   To learn more about what you can do with MyChart, go to [email or web address].    Your next appointment:   12 month(s)  The format for your next appointment:   In Person  Provider:   Brian Crenshaw, MD   

## 2022-09-13 ENCOUNTER — Telehealth: Payer: Self-pay | Admitting: Cardiology

## 2022-09-13 NOTE — Progress Notes (Signed)
Hi [name], the x-ray of your knee shows moderate to severe arthritis but no sign of fracture I just wanted to make sure that there was not nothing new going on with the recent injury.  I would recommend maybe getting back in with Dr. Bertram Millard.  In the meantime just elevate ice and recommend compression using an Ace wrap to help with some of the swelling that you are experiencing.

## 2022-09-13 NOTE — Telephone Encounter (Signed)
Follow Up: Reference#  [id number]    He needs the correct dosage of patient's Clondine please.     Pt c/o medication issue:  1. Name of Medication: Clonidine  2. How are you currently taking this medication (dosage and times per day)?  That is what pharmacist needs to know   3. Are you having a reaction (difficulty breathing--STAT)?   4. What is your medication issue? What is the correct dose

## 2022-09-16 NOTE — Telephone Encounter (Signed)
Called [facility] back with dosage of 0.'4mg'$  for clonidine patch. They will update their records. If there are additional questions they will call us back.

## 2022-09-16 NOTE — Telephone Encounter (Signed)
Left message for pt to call, what dose is she taking?

## 2022-09-16 NOTE — Telephone Encounter (Signed)
Pt is returning call.  States that med patch is at 0.4 mg.

## 2022-09-17 ENCOUNTER — Telehealth: Payer: Self-pay | Admitting: Cardiology

## 2022-09-17 NOTE — Telephone Encounter (Signed)
     Pre-operative Risk Assessment    Patient Name: [name]  DOB: 12-14-1955 MRN: [id number]      Request for Surgical Clearance    Procedure:   excision benign skin lesion   Date of Surgery:  Clearance 09/23/22                                 Surgeon:  Dr. Boneta Lucks Surgeon's Group or Practice Name:  [facility] Phone number:  [phone] Fax number:  [phone]   Type of Clearance Requested:   - Medical  - Pharmacy:  Hold Clopidogrel (Plavix) and Gabapentin - defer to cards   Type of Anesthesia:   CHOICE   Additional requests/questions:    Signed, Selinda Orion   09/17/2022, 11:49 AM

## 2022-09-18 NOTE — Telephone Encounter (Signed)
   Patient Name: [name]  DOB: Jan 06, 1956 MRN: [id number]  Primary Cardiologist: Kirk Ruths, MD  Chart reviewed as part of pre-operative protocol coverage. Pre-op clearance already addressed by colleagues in earlier phone notes. To summarize recommendations:  Okay to hold Plavix x 5 day prior to procedure and she may proceed without further cardiac evaluation.  -Dr. Stanford Breed  Gabapentin hold will need to come from prescribing physician.   Will route this bundled recommendation to requesting provider via Epic fax function and remove from pre-op pool. Please call with questions.  Elgie Collard, PA-C 09/18/2022, 9:33 AM

## 2022-09-21 ENCOUNTER — Other Ambulatory Visit: Payer: Self-pay | Admitting: Cardiology

## 2022-09-23 ENCOUNTER — Other Ambulatory Visit: Payer: Self-pay | Admitting: Podiatry

## 2022-09-23 ENCOUNTER — Encounter: Payer: Self-pay | Admitting: Podiatry

## 2022-09-23 ENCOUNTER — Telehealth: Payer: Self-pay | Admitting: Cardiology

## 2022-09-23 DIAGNOSIS — L989 Disorder of the skin and subcutaneous tissue, unspecified: Secondary | ICD-10-CM | POA: Diagnosis not present

## 2022-09-23 DIAGNOSIS — D2122 Benign neoplasm of connective and other soft tissue of left lower limb, including hip: Secondary | ICD-10-CM | POA: Diagnosis not present

## 2022-09-23 DIAGNOSIS — B078 Other viral warts: Secondary | ICD-10-CM | POA: Diagnosis not present

## 2022-09-23 DIAGNOSIS — D2372 Other benign neoplasm of skin of left lower limb, including hip: Secondary | ICD-10-CM | POA: Diagnosis not present

## 2022-09-23 MED ORDER — OXYCODONE-ACETAMINOPHEN 5-325 MG PO TABS: 1.0000 | ORAL_TABLET | ORAL | 0 refills | Status: DC | PRN

## 2022-09-23 MED ORDER — IBUPROFEN 800 MG PO TABS: 800.0000 mg | ORAL_TABLET | Freq: Four times a day (QID) | ORAL | 1 refills | Status: AC | PRN

## 2022-09-23 NOTE — Telephone Encounter (Signed)
Left message for patient to call and confirm the dose she is currently taking.

## 2022-09-23 NOTE — Telephone Encounter (Signed)
Spoke with pharmacist at [facility]. He is concerned about patient cutting the nitrodur patch 0.'2mg'$ /hr into fourths. He stated if the patch is cut, the medication will leak out and patient will not get the prescribed dose. Please clarify dose and if patch is to be cut.

## 2022-09-23 NOTE — Telephone Encounter (Signed)
Pt c/o medication issue:  1. Name of Medication:   nitroGLYCERIN (NITRODUR - DOSED IN MG/24 HR) 0.2 mg/hr patch    2. How are you currently taking this medication (dosage and times per day)? Apply 1/4 patch daily to tendon for tendonitis   3. Are you having a reaction (difficulty breathing--STAT)?   4. What is your medication issue? Pharmacist called stating they received medication, however this prescription is written for 1 patch and they only are 30 patch supplies, they are requesting another prescription for the pt.

## 2022-09-25 NOTE — Telephone Encounter (Signed)
Left message for pt to call.

## 2022-09-26 NOTE — Telephone Encounter (Signed)
Follow UP:     Patient is returning Debra's call from today.;

## 2022-09-26 NOTE — Telephone Encounter (Signed)
Spoke with pt, she reports she is cutting the patch in 1/4 per dr corey's instructions. Spoke with [facility] and advised them to contact dr Ellard Artis corey regarding NTG patch.

## 2022-09-26 NOTE — Telephone Encounter (Signed)
Left message for pt to call.

## 2022-09-26 NOTE — Telephone Encounter (Signed)
Pt is returning call and requesting call back.

## 2022-10-02 ENCOUNTER — Ambulatory Visit (INDEPENDENT_AMBULATORY_CARE_PROVIDER_SITE_OTHER): Payer: Medicare Other | Admitting: Podiatry

## 2022-10-02 DIAGNOSIS — Z9889 Other specified postprocedural states: Secondary | ICD-10-CM

## 2022-10-02 DIAGNOSIS — L989 Disorder of the skin and subcutaneous tissue, unspecified: Secondary | ICD-10-CM

## 2022-10-02 NOTE — Progress Notes (Signed)
Subjective:  Patient ID: [name], female    DOB: 1956/09/07,  MRN: [id number]  Chief Complaint  Patient presents with   Routine Post Op    POV #1 DOS 09/23/2022 LT EXCISION OF BENIGN SKIN LESION    DOS: 09/23/2022 Procedure: Left excision of the skin lesion heel  66 y.o. female returns for post-op check.  Patient states that she is doing well.  Bandages clean dry and intact.  She has been toe walking with a walker.  Review of Systems: Negative except as noted in the HPI. Denies N/V/F/Ch.  Past Medical History:  Diagnosis Date   Allergy    Anemia    Anxiety    Bipolar disorder (HCC)    CHF (congestive heart failure) (HCC)    Chronic diastolic heart failure ([place])    hospitalized at [facility]   Closed nondisplaced fracture of phalanx of toe of left foot 06/09/7168   Complication of anesthesia    had "breathing problems and ended up in ICU" 03/2010 report on chart   COPD (chronic obstructive pulmonary disease) ([place])    DDD (degenerative disc disease), lumbar    Depression    Diabetes in pregnancy    diet cnontrolled   GERD (gastroesophageal reflux disease)    Glucose intolerance (impaired glucose tolerance)    HLD (hyperlipidemia)    nec/nos   HTN (hypertension)    benign essential   MVP (mitral valve prolapse)    Narcolepsy    Stroke ([place])    Suicide attempt ([place])    hx   Syncope    admx to [facility] in 2/12: Echo with normal LVF; head CT unremarkable, ECG stable, no further w/u   Ulcer     Current Outpatient Medications:    amitriptyline (ELAVIL) 10 MG tablet, Take by mouth., Disp: , Rfl:    AMBULATORY NON FORMULARY MEDICATION, Walker. Use as needed. Disp 1.  Unable gait R26.81, Disp: 1 each, Rfl: 0   amLODipine-olmesartan (AZOR) 10-40 MG tablet, Take 1 tablet by mouth daily., Disp: 30 tablet, Rfl: 11   ASPIRIN LOW DOSE 81 MG tablet, Take 1 tablet by mouth every day, Disp: 30 tablet, Rfl: 11   atorvastatin (LIPITOR) 80 MG tablet, Take 1 tablet (80 mg  total) by mouth daily., Disp: 30 tablet, Rfl: 11   busPIRone (BUSPAR) 10 MG tablet, Take 1 tablet by mouth 3 times a day, Disp: 90 tablet, Rfl: 11   cloNIDine (CATAPRES - DOSED IN MG/24 HR) 0.1 mg/24hr patch, Place 1 patch on the skin once a week, Disp: 4 patch, Rfl: 11   cloNIDine (CATAPRES - DOSED IN MG/24 HR) 0.3 mg/24hr patch, Place 1 patch (0.3 mg total) onto the skin once a week., Disp: 12 patch, Rfl: 1   clopidogrel (PLAVIX) 75 MG tablet, Take 1 tablet (75 mg total) by mouth daily., Disp: 90 tablet, Rfl: 3   diclofenac Sodium (VOLTAREN) 1 % GEL, Apply 4 grams to the skin four times a day, Disp: 500 g, Rfl: 11   DULoxetine (CYMBALTA) 60 MG capsule, Take 1 capsule by mouth every evening, Disp: 30 capsule, Rfl: 11   esomeprazole (NEXIUM) 40 MG capsule, Take 40 mg by mouth daily., Disp: , Rfl:    finasteride (PROPECIA) 1 MG tablet, Take by mouth., Disp: , Rfl:    furosemide (LASIX) 40 MG tablet, Take 1 tablet (40 mg total) by mouth 2 (two) times daily., Disp: 60 tablet, Rfl: 11   gabapentin (NEURONTIN) 800 MG tablet, Take 1  tablet by mouth 3 times a day, Disp: 90 tablet, Rfl: 11   hydrALAZINE (APRESOLINE) 100 MG tablet, Take 1 tablet (100 mg total) by mouth 3 (three) times daily., Disp: 90 tablet, Rfl: 11   ibuprofen (ADVIL) 800 MG tablet, Take 1 tablet (800 mg total) by mouth every 6 (six) hours as needed., Disp: 60 tablet, Rfl: 1   labetalol (NORMODYNE) 300 MG tablet, Take 2 tablets by mouth twice daily, Disp: 120 tablet, Rfl: 11   lidocaine (XYLOCAINE) 5 % ointment, Apply 1 Application topically as needed for mild pain., Disp: , Rfl:    MINOXIDIL, TOPICAL, 5 % SOLN, Apply 1 application topically daily., Disp: 120 mL, Rfl: PRN   Multiple Vitamins-Minerals (MULTIVITAMINS THER. W/MINERALS) TABS, Take 1 tablet by mouth daily., Disp: , Rfl:    nitroGLYCERIN (NITRODUR - DOSED IN MG/24 HR) 0.2 mg/hr patch, Apply 1/4 patch daily to tendon for tendonitis, Disp: 0.2 patch, Rfl: 11    oxyCODONE-acetaminophen (PERCOCET) 5-325 MG tablet, Take 1 tablet by mouth every 4 (four) hours as needed for severe pain., Disp: 30 tablet, Rfl: 0   spironolactone (ALDACTONE) 50 MG tablet, Take 1 tablet (50 mg total) by mouth daily., Disp: 30 tablet, Rfl: 11   traZODone (DESYREL) 50 MG tablet, Take 50 mg by mouth at bedtime., Disp: , Rfl:    VENTOLIN HFA 108 (90 BASE) MCG/ACT inhaler, prn, Disp: , Rfl:    vitamin B-12 (CYANOCOBALAMIN) 500 MCG tablet, Take by mouth., Disp: , Rfl:   Social History   Tobacco Use  Smoking Status Former   Types: Cigarettes   Quit date: 10/08/2003   Years since quitting: 18.9  Smokeless Tobacco Never    Allergies  Allergen Reactions   Codeine Nausea And Vomiting and Nausea Only   Objective:  There were no vitals filed for this visit. There is no height or weight on file to calculate BMI. Constitutional Well developed. Well nourished.  Vascular Foot warm and well perfused. Capillary refill normal to all digits.   Neurologic Normal speech. Oriented to person, place, and time. Epicritic sensation to light touch grossly present bilaterally.  Dermatologic Skin healing well without signs of infection. Skin edges well coapted without signs of infection.  Orthopedic: Tenderness to palpation noted about the surgical site.   Radiographs:  None   Assessment:   1. Benign skin lesion   2. S/P foot surgery    Plan:  Patient was evaluated and treated and all questions answered.  S/p foot surgery left -Progressing as expected post-operatively. -XR: See above -WB Status: Forefoot weightbearing with a surgical shoe with a walker -Sutures: Intact.  Superficial dehiscence noted.  Granular wound bed noted -Medications: None -Betadine wet-to-dry dressing changes every other day  No follow-ups on file.

## 2022-10-04 ENCOUNTER — Other Ambulatory Visit: Payer: Self-pay | Admitting: Family Medicine

## 2022-10-16 ENCOUNTER — Ambulatory Visit (INDEPENDENT_AMBULATORY_CARE_PROVIDER_SITE_OTHER): Payer: Medicare Other | Admitting: Podiatry

## 2022-10-16 VITALS — BP 134/72

## 2022-10-16 DIAGNOSIS — L989 Disorder of the skin and subcutaneous tissue, unspecified: Secondary | ICD-10-CM

## 2022-10-16 NOTE — Progress Notes (Signed)
Subjective:  Patient ID: [name], female    DOB: August 23, 1956,  MRN: [id number]  Chief Complaint  Patient presents with   Routine Post Op    POV #1 DOS 09/23/2022 LT EXCISION OF BENIGN SKIN LESION    DOS: 09/23/2022 Procedure: Left excision of the skin lesion heel  67 y.o. female returns for post-op check.  Patient states that she is doing well.  Bandages clean dry and intact.  She has been toe walking with a walker.  Review of Systems: Negative except as noted in the HPI. Denies N/V/F/Ch.  Past Medical History:  Diagnosis Date   Allergy    Anemia    Anxiety    Bipolar disorder (HCC)    CHF (congestive heart failure) (HCC)    Chronic diastolic heart failure ([place])    hospitalized at [facility]   Closed nondisplaced fracture of phalanx of toe of left foot 10/15/6220   Complication of anesthesia    had "breathing problems and ended up in ICU" 03/2010 report on chart   COPD (chronic obstructive pulmonary disease) ([place])    DDD (degenerative disc disease), lumbar    Depression    Diabetes in pregnancy    diet cnontrolled   GERD (gastroesophageal reflux disease)    Glucose intolerance (impaired glucose tolerance)    HLD (hyperlipidemia)    nec/nos   HTN (hypertension)    benign essential   MVP (mitral valve prolapse)    Narcolepsy    Stroke ([place])    Suicide attempt ([place])    hx   Syncope    admx to [facility] in 2/12: Echo with normal LVF; head CT unremarkable, ECG stable, no further w/u   Ulcer     Current Outpatient Medications:    amitriptyline (ELAVIL) 10 MG tablet, Take by mouth., Disp: , Rfl:    AMBULATORY NON FORMULARY MEDICATION, Walker. Use as needed. Disp 1.  Unable gait R26.81, Disp: 1 each, Rfl: 0   amLODipine-olmesartan (AZOR) 10-40 MG tablet, Take 1 tablet by mouth daily., Disp: 30 tablet, Rfl: 11   ASPIRIN LOW DOSE 81 MG tablet, Take 1 tablet by mouth every day, Disp: 30 tablet, Rfl: 11   atorvastatin (LIPITOR) 80 MG tablet, Take 1 tablet (80 mg  total) by mouth daily., Disp: 30 tablet, Rfl: 11   busPIRone (BUSPAR) 10 MG tablet, Take 1 tablet by mouth 3 times a day, Disp: 90 tablet, Rfl: 11   cloNIDine (CATAPRES - DOSED IN MG/24 HR) 0.1 mg/24hr patch, Place 1 patch on the skin once a week, Disp: 4 patch, Rfl: 11   cloNIDine (CATAPRES - DOSED IN MG/24 HR) 0.3 mg/24hr patch, Place 1 patch (0.3 mg total) onto the skin once a week., Disp: 12 patch, Rfl: 1   clopidogrel (PLAVIX) 75 MG tablet, Take 1 tablet (75 mg total) by mouth daily., Disp: 90 tablet, Rfl: 3   diclofenac Sodium (VOLTAREN) 1 % GEL, Apply 4 grams to the skin four times a day, Disp: 500 g, Rfl: 11   DULoxetine (CYMBALTA) 60 MG capsule, Take 1 capsule by mouth every evening, Disp: 30 capsule, Rfl: 11   esomeprazole (NEXIUM) 40 MG capsule, Take 40 mg by mouth daily., Disp: , Rfl:    finasteride (PROPECIA) 1 MG tablet, Take by mouth., Disp: , Rfl:    furosemide (LASIX) 40 MG tablet, Take 1 tablet (40 mg total) by mouth 2 (two) times daily., Disp: 60 tablet, Rfl: 11   gabapentin (NEURONTIN) 800 MG tablet, Take 1  tablet by mouth 3 times a day, Disp: 90 tablet, Rfl: 11   hydrALAZINE (APRESOLINE) 100 MG tablet, Take 1 tablet (100 mg total) by mouth 3 (three) times daily., Disp: 90 tablet, Rfl: 11   ibuprofen (ADVIL) 800 MG tablet, Take 1 tablet (800 mg total) by mouth every 6 (six) hours as needed., Disp: 60 tablet, Rfl: 1   labetalol (NORMODYNE) 300 MG tablet, Take 2 tablets by mouth twice daily, Disp: 120 tablet, Rfl: 11   lidocaine (XYLOCAINE) 5 % ointment, Apply 1 Application topically as needed for mild pain., Disp: , Rfl:    MINOXIDIL, TOPICAL, 5 % SOLN, Apply 1 application topically daily., Disp: 120 mL, Rfl: PRN   Multiple Vitamins-Minerals (MULTIVITAMINS THER. W/MINERALS) TABS, Take 1 tablet by mouth daily., Disp: , Rfl:    nitroGLYCERIN (NITRODUR - DOSED IN MG/24 HR) 0.2 mg/hr patch, Apply 1/4 patch daily to tendon for tendonitis, Disp: 0.2 patch, Rfl: 11    oxyCODONE-acetaminophen (PERCOCET) 5-325 MG tablet, Take 1 tablet by mouth every 4 (four) hours as needed for severe pain., Disp: 30 tablet, Rfl: 0   spironolactone (ALDACTONE) 50 MG tablet, Take 1 tablet (50 mg total) by mouth daily., Disp: 30 tablet, Rfl: 11   traZODone (DESYREL) 50 MG tablet, Take 50 mg by mouth at bedtime., Disp: , Rfl:    VENTOLIN HFA 108 (90 BASE) MCG/ACT inhaler, prn, Disp: , Rfl:    vitamin B-12 (CYANOCOBALAMIN) 500 MCG tablet, Take by mouth., Disp: , Rfl:   Social History   Tobacco Use  Smoking Status Former   Types: Cigarettes   Quit date: 10/08/2003   Years since quitting: 18.9  Smokeless Tobacco Never    Allergies  Allergen Reactions   Codeine Nausea And Vomiting and Nausea Only   Objective:  There were no vitals filed for this visit. There is no height or weight on file to calculate BMI. Constitutional Well developed. Well nourished.  Vascular Foot warm and well perfused. Capillary refill normal to all digits.   Neurologic Normal speech. Oriented to person, place, and time. Epicritic sensation to light touch grossly present bilaterally.  Dermatologic Skin mostly reepithelialized.  Small area of superficial dehiscence noted no complication noted.  No purulent drainage noted.  No signs of recurrence noted.  Orthopedic: Mild tenderness to palpation noted about the surgical site.   Radiographs:  None   Assessment:   1. Benign skin lesion   2. S/P foot surgery    Plan:  Patient was evaluated and treated and all questions answered.  S/p foot surgery left -Progressing as expected post-operatively. -XR: See above -WB Status: Forefoot weightbearing with a surgical shoe with a walker -Sutures: Removed. -Medications: None -Clinically healing well.  Very minimal superficial Deis is noted at this point.  Overall improving considerably  No follow-ups on file.

## 2022-10-24 ENCOUNTER — Encounter: Payer: Self-pay | Admitting: *Deleted

## 2022-10-24 ENCOUNTER — Other Ambulatory Visit: Payer: Self-pay | Admitting: *Deleted

## 2022-10-24 DIAGNOSIS — I10 Essential (primary) hypertension: Secondary | ICD-10-CM

## 2022-10-24 DIAGNOSIS — E7800 Pure hypercholesterolemia, unspecified: Secondary | ICD-10-CM

## 2022-10-24 DIAGNOSIS — I2510 Atherosclerotic heart disease of native coronary artery without angina pectoris: Secondary | ICD-10-CM

## 2022-10-31 DIAGNOSIS — I2510 Atherosclerotic heart disease of native coronary artery without angina pectoris: Secondary | ICD-10-CM | POA: Diagnosis not present

## 2022-10-31 DIAGNOSIS — I10 Essential (primary) hypertension: Secondary | ICD-10-CM | POA: Diagnosis not present

## 2022-10-31 DIAGNOSIS — E7800 Pure hypercholesterolemia, unspecified: Secondary | ICD-10-CM | POA: Diagnosis not present

## 2022-11-01 LAB — COMPREHENSIVE METABOLIC PANEL
ALT: 15 IU/L (ref 0–32)
AST: 18 IU/L (ref 0–40)
Albumin/Globulin Ratio: 2 (ref 1.2–2.2)
Albumin: 4.4 g/dL (ref 3.9–4.9)
Alkaline Phosphatase: 104 IU/L (ref 44–121)
BUN/Creatinine Ratio: 9 — ABNORMAL LOW (ref 12–28)
BUN: 8 mg/dL (ref 8–27)
Bilirubin Total: 0.7 mg/dL (ref 0.0–1.2)
CO2: 24 mmol/L (ref 20–29)
Calcium: 9.5 mg/dL (ref 8.7–10.3)
Chloride: 106 mmol/L (ref 96–106)
Creatinine, Ser: 0.86 mg/dL (ref 0.57–1.00)
Globulin, Total: 2.2 g/dL (ref 1.5–4.5)
Glucose: 88 mg/dL (ref 70–99)
Potassium: 4.4 mmol/L (ref 3.5–5.2)
Sodium: 143 mmol/L (ref 134–144)
Total Protein: 6.6 g/dL (ref 6.0–8.5)
eGFR: 75 mL/min/{1.73_m2} (ref >59)

## 2022-11-01 LAB — LIPID PANEL
Chol/HDL Ratio: 2.3 ratio (ref 0.0–4.4)
Cholesterol, Total: 127 mg/dL (ref 100–199)
HDL: 55 mg/dL (ref >39)
LDL Chol Calc (NIH): 59 mg/dL (ref 0–99)
Triglycerides: 62 mg/dL (ref 0–149)
VLDL Cholesterol Cal: 13 mg/dL (ref 5–40)

## 2022-11-04 ENCOUNTER — Encounter: Payer: Self-pay | Admitting: *Deleted

## 2022-11-13 ENCOUNTER — Ambulatory Visit (INDEPENDENT_AMBULATORY_CARE_PROVIDER_SITE_OTHER): Payer: Medicare Other | Admitting: Podiatry

## 2022-11-13 DIAGNOSIS — L989 Disorder of the skin and subcutaneous tissue, unspecified: Secondary | ICD-10-CM | POA: Diagnosis not present

## 2022-11-13 NOTE — Progress Notes (Signed)
Subjective:  Patient ID: [name], female    DOB: 1956/06/23,  MRN: [id number]  Chief Complaint  Patient presents with   Routine Post Op    POV #2 DOS 09/23/2022 LT EXCISION OF BENIGN SKIN LESION Pt stated that she still has some discomfort and feels like there is a stitch in the heel     DOS: 09/23/2022 Procedure: Left excision of the skin lesion heel  67 y.o. female returns for post-op check.  Patient states that she is doing well.  Bandages clean dry and intact.  She has been toe walking with a walker.  Review of Systems: Negative except as noted in the HPI. Denies N/V/F/Ch.  Past Medical History:  Diagnosis Date   Allergy    Anemia    Anxiety    Bipolar disorder (HCC)    CHF (congestive heart failure) (HCC)    Chronic diastolic heart failure ([place])    hospitalized at [facility] regional   Closed nondisplaced fracture of phalanx of toe of left foot [id number]   Complication of anesthesia    had "breathing problems and ended up in ICU" 03/2010 report on chart   COPD (chronic obstructive pulmonary disease) ([place])    DDD (degenerative disc disease), lumbar    Depression    Diabetes in pregnancy    diet cnontrolled   GERD (gastroesophageal reflux disease)    Glucose intolerance (impaired glucose tolerance)    HLD (hyperlipidemia)    nec/nos   HTN (hypertension)    benign essential   MVP (mitral valve prolapse)    Narcolepsy    Stroke ([place])    Suicide attempt ([place])    hx   Syncope    admx to [facility] in 2/12: Echo with normal LVF; head CT unremarkable, ECG stable, no further w/u   Ulcer     Current Outpatient Medications:    AMBULATORY NON FORMULARY MEDICATION, Walker. Use as needed. Disp 1.  Unable gait R26.81, Disp: 1 each, Rfl: 0   amitriptyline (ELAVIL) 10 MG tablet, Take by mouth., Disp: , Rfl:    amLODipine-olmesartan (AZOR) 10-40 MG tablet, Take 1 tablet by mouth daily., Disp: 30 tablet, Rfl: 11   ASPIRIN LOW DOSE 81 MG tablet, Take 1 tablet by mouth every  day, Disp: 30 tablet, Rfl: 11   atorvastatin (LIPITOR) 80 MG tablet, Take 1 tablet (80 mg total) by mouth daily., Disp: 30 tablet, Rfl: 11   busPIRone (BUSPAR) 10 MG tablet, Take 1 tablet by mouth 3 times a day, Disp: 90 tablet, Rfl: 11   cloNIDine (CATAPRES - DOSED IN MG/24 HR) 0.1 mg/24hr patch, Place 1 patch on the skin once a week, Disp: 4 patch, Rfl: 11   cloNIDine (CATAPRES - DOSED IN MG/24 HR) 0.3 mg/24hr patch, Place 1 patch (0.3 mg total) onto the skin once a week., Disp: 12 patch, Rfl: 1   clopidogrel (PLAVIX) 75 MG tablet, Take 1 tablet (75 mg total) by mouth daily., Disp: 90 tablet, Rfl: 3   diclofenac Sodium (VOLTAREN) 1 % GEL, Apply 4 grams to the skin four times a day, Disp: 500 g, Rfl: 11   DULoxetine (CYMBALTA) 60 MG capsule, Take 1 capsule by mouth every evening, Disp: 30 capsule, Rfl: 11   esomeprazole (NEXIUM) 40 MG capsule, Take 40 mg by mouth daily., Disp: , Rfl:    finasteride (PROPECIA) 1 MG tablet, Take by mouth., Disp: , Rfl:    furosemide (LASIX) 40 MG tablet, Take 1 tablet (40 mg total) by  mouth 2 (two) times daily., Disp: 60 tablet, Rfl: 11   gabapentin (NEURONTIN) 800 MG tablet, Take 1 tablet by mouth 3 times a day, Disp: 90 tablet, Rfl: 11   hydrALAZINE (APRESOLINE) 100 MG tablet, Take 1 tablet (100 mg total) by mouth 3 (three) times daily., Disp: 90 tablet, Rfl: 11   ibuprofen (ADVIL) 800 MG tablet, Take 1 tablet (800 mg total) by mouth every 6 (six) hours as needed., Disp: 60 tablet, Rfl: 1   labetalol (NORMODYNE) 300 MG tablet, Take 2 tablets by mouth twice daily, Disp: 120 tablet, Rfl: 11   lidocaine (XYLOCAINE) 5 % ointment, Apply 1 Application topically as needed for mild pain., Disp: , Rfl:    MINOXIDIL, TOPICAL, 5 % SOLN, Apply 1 application topically daily., Disp: 120 mL, Rfl: PRN   Multiple Vitamins-Minerals (MULTIVITAMINS THER. W/MINERALS) TABS, Take 1 tablet by mouth daily., Disp: , Rfl:    nitroGLYCERIN (NITRODUR - DOSED IN MG/24 HR) 0.2 mg/hr patch,  Apply 1/4 patch daily to tendon for tendonitis, Disp: 0.2 patch, Rfl: 11   oxyCODONE-acetaminophen (PERCOCET) 5-325 MG tablet, Take 1 tablet by mouth every 4 (four) hours as needed for severe pain., Disp: 30 tablet, Rfl: 0   spironolactone (ALDACTONE) 50 MG tablet, Take 1 tablet (50 mg total) by mouth daily., Disp: 30 tablet, Rfl: 11   traZODone (DESYREL) 50 MG tablet, Take 50 mg by mouth at bedtime., Disp: , Rfl:    VENTOLIN HFA 108 (90 BASE) MCG/ACT inhaler, prn, Disp: , Rfl:    vitamin B-12 (CYANOCOBALAMIN) 500 MCG tablet, Take by mouth., Disp: , Rfl:   Social History   Tobacco Use  Smoking Status Former   Types: Cigarettes   Quit date: 10/08/2003   Years since quitting: 19.1  Smokeless Tobacco Never    Allergies  Allergen Reactions   Codeine Nausea And Vomiting and Nausea Only   Objective:  There were no vitals filed for this visit. There is no height or weight on file to calculate BMI. Constitutional Well developed. Well nourished.  Vascular Foot warm and well perfused. Capillary refill normal to all digits.   Neurologic Normal speech. Oriented to person, place, and time. Epicritic sensation to light touch grossly present bilaterally.  Dermatologic Skin mostly reepithelialized.  No further Deis is noted.  Hyperkeratotic lesion noted.  No abnormalities noted.  Orthopedic: Mild tenderness to palpation noted about the surgical site.   Radiographs:  None   Assessment:   No diagnosis found.  Plan:  Patient was evaluated and treated and all questions answered.  S/p foot surgery left -Clinically the incision has reepithelialized really well.  She is experiencing some neuritis pain.  I discussed some topical medications that can help.  If any foot and ankle issues on future she will come back and see me.  She is officially discharged from my care.  No follow-ups on file.

## 2022-11-18 ENCOUNTER — Ambulatory Visit (INDEPENDENT_AMBULATORY_CARE_PROVIDER_SITE_OTHER): Payer: Medicare Other | Admitting: Family Medicine

## 2022-11-18 DIAGNOSIS — Z0000 Encounter for general adult medical examination without abnormal findings: Secondary | ICD-10-CM

## 2022-11-18 DIAGNOSIS — Z1231 Encounter for screening mammogram for malignant neoplasm of breast: Secondary | ICD-10-CM

## 2022-11-18 DIAGNOSIS — Z780 Asymptomatic menopausal state: Secondary | ICD-10-CM

## 2022-11-18 NOTE — Patient Instructions (Addendum)
[place] Maintenance Summary and Written Plan of Care  [name] ,  Thank you for allowing me to perform your Medicare Annual Wellness Visit and for your ongoing commitment to your health.   Health Maintenance & Immunization History Health Maintenance  Topic Date Due   OPHTHALMOLOGY EXAM  11/18/2022 (Originally [birth year]/14/1966)   Diabetic kidney evaluation - Urine ACR  11/19/2022 (Originally 12/13/2009)   FOOT EXAM  11/19/2022 (Originally 05/14/1967)   COVID-19 Vaccine (3 - 2023-24 season) 12/04/2022 (Originally 06/07/2022)   Zoster Vaccines- Shingrix (1 of 2) 12/10/2022 (Originally 05/13/1976)   HEMOGLOBIN A1C  12/17/2022 (Originally 10/15/2017)   INFLUENZA VACCINE  01/05/2023 (Originally 05/07/2022)   DEXA SCAN  09/11/2023 (Originally 05/13/2022)   Pneumonia Vaccine 20+ Years old (1 of 1 - PCV) 11/19/2023 (Originally 05/13/2022)   MAMMOGRAM  10/04/2023   Diabetic kidney evaluation - eGFR measurement  11/01/2023   Medicare Annual Wellness (AWV)  11/19/2023   DTaP/Tdap/Td (2 - Td or Tdap) 12/20/2024   COLONOSCOPY (Pts 45-36yr Insurance coverage will need to be confirmed)  07/25/2029   Hepatitis C Screening  Completed   HIV Screening  Completed   HPV VACCINES  Aged Out   Immunization History  Administered Date(s) Administered   Influenza Split 06/27/2011   Influenza Whole 07/15/2007   Influenza,inj,Quad PF,6+ Mos 07/31/2016   Janssen (J&J) SARS-COV-2 Vaccination 02/04/2020   PFIZER(Purple Top)SARS-COV-2 Vaccination 11/07/2020   Tdap 12/21/2014    These are the patient goals that we discussed:  Goals Addressed               This Visit's Progress     Patient Stated (pt-stated)        Patient stated that she would like to be able to walk without being in pain.         This is a list of Health Maintenance Items that are overdue or due now: Pneumococcal vaccine  Foot exam  Diabetic eye exam Urine ACR Hemoglobin A1C Shingrix vaccine Dexa  scan Mammogram   Orders/Referrals Placed Today: Orders Placed This Encounter  Procedures   DEXAScan    Standing Status:   Future    Standing Expiration Date:   11/19/2023    Scheduling Instructions:     Please call patient to schedule    Order Specific Question:   Reason for exam:    Answer:   post menopausal    Order Specific Question:   Preferred imaging location?    Answer:   [facility]  Mammogram 3D SCREEN BREAST BILATERAL    Standing Status:   Future    Standing Expiration Date:   11/19/2023    Scheduling Instructions:     Please call patient to schedule    Order Specific Question:   Reason for Exam (SYMPTOM  OR DIAGNOSIS REQUIRED)    Answer:   breast cancer screening    Order Specific Question:   Preferred imaging location?    Answer:   [facility]  (Contact our referral department at [phone] you have not spoken with someone about your referral appointment within the next 5 days)    Follow-up Plan Follow-up with MHali Marry MD as planned Schedule shingles vaccine at the pharmacy.  Schedule your eye exam.  Discuss Foot exam, Urine ACR, Pneumococcal vaccine and Hemoglobin A1C with PCP. Medicare wellness visit in one year.  Patient will access AVS on my chart.      Health Maintenance, Female Adopting a healthy lifestyle and  getting preventive care are important in promoting health and wellness. Ask your health care provider about: The right schedule for you to have regular tests and exams. Things you can do on your own to prevent diseases and keep yourself healthy. What should I know about diet, weight, and exercise? Eat a healthy diet  Eat a diet that includes plenty of vegetables, fruits, low-fat dairy products, and lean protein. Do not eat a lot of foods that are high in solid fats, added sugars, or sodium. Maintain a healthy weight Body mass index (BMI) is used to identify weight problems. It estimates body fat based on  height and weight. Your health care provider can help determine your BMI and help you achieve or maintain a healthy weight. Get regular exercise Get regular exercise. This is one of the most important things you can do for your health. Most adults should: Exercise for at least 150 minutes each week. The exercise should increase your heart rate and make you sweat (moderate-intensity exercise). Do strengthening exercises at least twice a week. This is in addition to the moderate-intensity exercise. Spend less time sitting. Even light physical activity can be beneficial. Watch cholesterol and blood lipids Have your blood tested for lipids and cholesterol at 66 years of age, then have this test every 5 years. Have your cholesterol levels checked more often if: Your lipid or cholesterol levels are high. You are older than 66 years of age. You are at high risk for heart disease. What should I know about cancer screening? Depending on your health history and family history, you may need to have cancer screening at various ages. This may include screening for: Breast cancer. Cervical cancer. Colorectal cancer. Skin cancer. Lung cancer. What should I know about heart disease, diabetes, and high blood pressure? Blood pressure and heart disease High blood pressure causes heart disease and increases the risk of stroke. This is more likely to develop in people who have high blood pressure readings or are overweight. Have your blood pressure checked: Every 3-5 years if you are 100-76 years of age. Every year if you are 72 years old or older. Diabetes Have regular diabetes screenings. This checks your fasting blood sugar level. Have the screening done: Once every three years after age 66 if you are at a normal weight and have a low risk for diabetes. More often and at a younger age if you are overweight or have a high risk for diabetes. What should I know about preventing infection? Hepatitis B If you  have a higher risk for hepatitis B, you should be screened for this virus. Talk with your health care provider to find out if you are at risk for hepatitis B infection. Hepatitis C Testing is recommended for: Everyone born from [birth year] through 1964. Anyone with known risk factors for hepatitis C. Sexually transmitted infections (STIs) Get screened for STIs, including gonorrhea and chlamydia, if: You are sexually active and are younger than 66 years of age. You are older than 66 years of age and your health care provider tells you that you are at risk for this type of infection. Your sexual activity has changed since you were last screened, and you are at increased risk for chlamydia or gonorrhea. Ask your health care provider if you are at risk. Ask your health care provider about whether you are at high risk for HIV. Your health care provider may recommend a prescription medicine to help prevent HIV infection. If you choose to take medicine  to prevent HIV, you should first get tested for HIV. You should then be tested every 3 months for as long as you are taking the medicine. Pregnancy If you are about to stop having your period (premenopausal) and you may become pregnant, seek counseling before you get pregnant. Take 400 to 800 micrograms (mcg) of folic acid every day if you become pregnant. Ask for birth control (contraception) if you want to prevent pregnancy. Osteoporosis and menopause Osteoporosis is a disease in which the bones lose minerals and strength with aging. This can result in bone fractures. If you are 46 years old or older, or if you are at risk for osteoporosis and fractures, ask your health care provider if you should: Be screened for bone loss. Take a calcium or vitamin D supplement to lower your risk of fractures. Be given hormone replacement therapy (HRT) to treat symptoms of menopause. Follow these instructions at home: Alcohol use Do not drink alcohol if: Your health care  provider tells you not to drink. You are pregnant, may be pregnant, or are planning to become pregnant. If you drink alcohol: Limit how much you have to: 0-1 drink a day. Know how much alcohol is in your drink. In the [place] one drink equals one 12 oz bottle of beer (355 mL), one 5 oz glass of wine (148 mL), or one 1 oz glass of hard liquor (44 mL). Lifestyle Do not use any products that contain nicotine or tobacco. These products include cigarettes, chewing tobacco, and vaping devices, such as e-cigarettes. If you need help quitting, ask your health care provider. Do not use street drugs. Do not share needles. Ask your health care provider for help if you need support or information about quitting drugs. General instructions Schedule regular health, dental, and eye exams. Stay current with your vaccines. Tell your health care provider if: You often feel depressed. You have ever been abused or do not feel safe at home. Summary Adopting a healthy lifestyle and getting preventive care are important in promoting health and wellness. Follow your health care provider's instructions about healthy diet, exercising, and getting tested or screened for diseases. Follow your health care provider's instructions on monitoring your cholesterol and blood pressure. This information is not intended to replace advice given to you by your health care provider. Make sure you discuss any questions you have with your health care provider. Document Revised: 02/12/2021 Document Reviewed: 02/12/2021 Elsevier Patient Education  [place].

## 2022-11-18 NOTE — Progress Notes (Signed)
MEDICARE ANNUAL WELLNESS VISIT  11/18/2022  Telephone Visit Disclaimer This Medicare AWV was conducted by telephone due to national recommendations for restrictions regarding the COVID-19 Pandemic (e.g. social distancing).  I verified, using two identifiers, that I am speaking with [name] or their authorized healthcare agent. I discussed the limitations, risks, security, and privacy concerns of performing an evaluation and management service by telephone and the potential availability of an in-person appointment in the future. The patient expressed understanding and agreed to proceed.  Location of Patient: Home Location of Provider (nurse):  In the office.  Subjective:    [name] is a 67 y.o. female patient of [name], Rene Kocher, MD who had a Medicare Annual Wellness Visit today via telephone. [name] is Legally disabled and lives with their spouse and her son. she has 2 children. she reports that she is socially active and does interact with friends/family regularly. she is minimally physically active and enjoys watching television.  Patient Care Team: Hali Marry, MD as PCP - General Stanford Breed Denice Bors, MD as PCP - Cardiology (Cardiology) Stanford Breed Denice Bors, MD as Consulting Physician (Cardiology) Lelon Perla, MD as Consulting Physician (Cardiology)     11/18/2022    8:14 AM 05/09/2022    1:19 PM 08/20/2021    1:59 PM 02/10/2018    3:30 PM 08/20/2016   10:29 AM 12/21/2014   12:27 PM 09/11/2011    3:00 PM  Advanced Directives  Does Patient Have a Medical Advance Directive? Yes Yes Yes Yes Yes No Patient does not have advance directive  Type of Advance Directive Living will [place];Living will [place];Living will [place];Living will [place];Living will    Does patient want to make changes to medical advance directive? No - Patient declined No - Patient declined No - Patient  declined  No - Patient declined    Copy of [place] in Chart?   No - copy requested No - copy requested     Would patient like information on creating a medical advance directive?      Yes - Educational materials given   Pre-existing out of facility DNR order (yellow form or pink MOST form)       No    Hospital Utilization Over the Past 12 Months: # of hospitalizations or ER visits: 1 # of surgeries: 1  Review of Systems    Patient reports that her overall health is unchanged compared to last year.  History obtained from chart review and the patient  Patient Reported Readings (BP, Pulse, CBG, Weight, etc) none  Pain Assessment Pain : 0-10 Pain Score: 8  Pain Type: Other (Comment) (surgical) Pain Location: Heel Pain Orientation: Left Pain Descriptors / Indicators: Aching Pain Onset: More than a month ago Pain Frequency: Intermittent Pain Relieving Factors: pain medication  Pain Relieving Factors: pain medication  Current Medications & Allergies (verified) Allergies as of 11/18/2022       Reactions   Codeine Nausea And Vomiting, Nausea Only        Medication List        Accurate as of November 18, 2022  8:29 AM. If you have any questions, ask your nurse or doctor.          AMBULATORY NON FORMULARY MEDICATION Walker. Use as needed. Disp 1.  Unable gait R26.81   amitriptyline 10 MG tablet Commonly known as: ELAVIL Take by mouth.  amLODipine-olmesartan 10-40 MG tablet Commonly known as: AZOR Take 1 tablet by mouth daily.   Aspirin Low Dose 81 MG tablet Generic drug: aspirin EC Take 1 tablet by mouth every day   atorvastatin 80 MG tablet Commonly known as: LIPITOR Take 1 tablet (80 mg total) by mouth daily.   busPIRone 10 MG tablet Commonly known as: BUSPAR Take 1 tablet by mouth 3 times a day   cloNIDine 0.1 mg/24hr patch Commonly known as: CATAPRES - Dosed in mg/24 hr Place 1 patch on the skin once a week   cloNIDine 0.3  mg/24hr patch Commonly known as: CATAPRES - Dosed in mg/24 hr Place 1 patch (0.3 mg total) onto the skin once a week.   clopidogrel 75 MG tablet Commonly known as: PLAVIX Take 1 tablet (75 mg total) by mouth daily.   cyanocobalamin 500 MCG tablet Commonly known as: VITAMIN B12 Take by mouth.   diclofenac Sodium 1 % Gel Commonly known as: VOLTAREN Apply 4 grams to the skin four times a day   DULoxetine 60 MG capsule Commonly known as: CYMBALTA Take 1 capsule by mouth every evening   esomeprazole 40 MG capsule Commonly known as: NEXIUM Take 40 mg by mouth daily.   finasteride 1 MG tablet Commonly known as: PROPECIA Take by mouth.   furosemide 40 MG tablet Commonly known as: LASIX Take 1 tablet (40 mg total) by mouth 2 (two) times daily.   gabapentin 800 MG tablet Commonly known as: NEURONTIN Take 1 tablet by mouth 3 times a day   hydrALAZINE 100 MG tablet Commonly known as: APRESOLINE Take 1 tablet (100 mg total) by mouth 3 (three) times daily.   ibuprofen 800 MG tablet Commonly known as: ADVIL Take 1 tablet (800 mg total) by mouth every 6 (six) hours as needed.   labetalol 300 MG tablet Commonly known as: NORMODYNE Take 2 tablets by mouth twice daily   lidocaine 5 % ointment Commonly known as: XYLOCAINE Apply 1 Application topically as needed for mild pain.   MINOXIDIL (TOPICAL) 5 % Soln Apply 1 application topically daily.   multivitamins ther. w/minerals Tabs tablet Take 1 tablet by mouth daily.   nitroGLYCERIN 0.2 mg/hr patch Commonly known as: NITRODUR - Dosed in mg/24 hr Apply 1/4 patch daily to tendon for tendonitis   oxyCODONE-acetaminophen 5-325 MG tablet Commonly known as: Percocet Take 1 tablet by mouth every 4 (four) hours as needed for severe pain.   spironolactone 50 MG tablet Commonly known as: ALDACTONE Take 1 tablet (50 mg total) by mouth daily.   traZODone 50 MG tablet Commonly known as: DESYREL Take 50 mg by mouth at bedtime.    Ventolin HFA 108 (90 Base) MCG/ACT inhaler Generic drug: albuterol prn        History (reviewed): Past Medical History:  Diagnosis Date   Allergy    Anemia    Anxiety    Bipolar disorder (HCC)    CHF (congestive heart failure) (HCC)    Chronic diastolic heart failure (HCC)    hospitalized at [facility]   Closed nondisplaced fracture of phalanx of toe of left foot [id number]   Complication of anesthesia    had "breathing problems and ended up in ICU" 03/2010 report on chart   COPD (chronic obstructive pulmonary disease) ([place])    DDD (degenerative disc disease), lumbar    Depression    Diabetes in pregnancy    diet cnontrolled   GERD (gastroesophageal reflux disease)    Glucose intolerance (impaired glucose  tolerance)    HLD (hyperlipidemia)    nec/nos   HTN (hypertension)    benign essential   MVP (mitral valve prolapse)    Narcolepsy    Stroke ([place])    Suicide attempt ([place])    hx   Syncope    admx to [facility] in 2/12: Echo with normal LVF; head CT unremarkable, ECG stable, no further w/u   Ulcer    Past Surgical History:  Procedure Laterality Date   AUGMENTATION MAMMAPLASTY  2003   BLADDER SUSPENSION  2002   DENTAL SURGERY  2016   Teeth implants x 4   GASTRIC BYPASS  2014   at [place] MICRODISCECTOMY  09/11/2011   Procedure: LUMBAR LAMINECTOMY/DECOMPRESSION MICRODISCECTOMY;  Surgeon: Tobi Bastos;  Location: [facility] OR[facility];  Service: Orthopedics;  Laterality: Right;  Hemi Laminectomy/Microdiscectomy L5 - S1 on the Right (X-Ray)   PARTIAL HYSTERECTOMY  1979   REPAIR PERONEAL TENDONS ANKLE Left 2008   after injury    Family History  Problem Relation Age of Onset   Heart attack Father 21   Hypertension Father    Diabetes Mother    Hypertension Mother    Stroke Other        grandmother   Colon cancer Neg Hx    Esophageal cancer Neg Hx    Stomach cancer Neg Hx    Rectal cancer Neg  Hx    Social History   Socioeconomic History   Marital status: Married    Spouse name: Travonna Kosowski   Number of children: 2   Years of education: 13   Highest education level: Some college, no degree  Occupational History   Occupation: legally disabled  Tobacco Use   Smoking status: Former    Types: Cigarettes    Quit date: 10/08/2003    Years since quitting: 19.1   Smokeless tobacco: Never  Vaping Use   Vaping Use: Never used  Substance and Sexual Activity   Alcohol use: Yes    Comment: rare   Drug use: No   Sexual activity: Not on file  Other Topics Concern   Not on file  Social History Narrative   Lives with her spouse and her son. She has two children. She enjoys watching television. She is not able to do a lot of since her stroke in January, 2022.   Social Determinants of Health   Financial Resource Strain: Low Risk  (11/18/2022)   Overall Financial Resource Strain (CARDIA)    Difficulty of Paying Living Expenses: Not hard at all  Food Insecurity: No Food Insecurity (11/18/2022)   Hunger Vital Sign    Worried About Running Out of Food in the Last Year: Never true    Ran Out of Food in the Last Year: Never true  Transportation Needs: No Transportation Needs (11/18/2022)   PRAPARE - Hydrologist (Medical): No    Lack of Transportation (Non-Medical): No  Physical Activity: Inactive (11/18/2022)   Exercise Vital Sign    Days of Exercise per Week: 0 days    Minutes of Exercise per Session: 0 min  Stress: No Stress Concern Present (11/18/2022)   [place]    Feeling of Stress : Only a little  Social Connections: Moderately Isolated (11/18/2022)   Social Connection and Isolation Panel [NHANES]    Frequency of Communication with Friends and Family: More than  three times a week    Frequency of Social Gatherings with Friends and Family: More than three times a week    Attends  Religious Services: Never    Marine scientist or Organizations: No    Attends Archivist Meetings: Never    Marital Status: Married    Activities of Daily Living    11/18/2022    8:16 AM  In your present state of health, do you have any difficulty performing the following activities:  Hearing? 0  Vision? 1  Comment some difficulty with reading small print.  Difficulty concentrating or making decisions? 0  Walking or climbing stairs? 1  Comment pain in both feet  Dressing or bathing? 0  Doing errands, shopping? 1  Comment does not drive  Preparing Food and eating ? N  Using the Toilet? N  In the past six months, have you accidently leaked urine? N  Do you have problems with loss of bowel control? N  Managing your Medications? N  Managing your Finances? N  Housekeeping or managing your Housekeeping? N    Patient Education/ Literacy How often do you need to have someone help you when you read instructions, pamphlets, or other written materials from your doctor or pharmacy?: 1 - Never What is the last grade level you completed in school?: 12th grade  Exercise Current Exercise Habits: The patient does not participate in regular exercise at present, Exercise limited by: orthopedic condition(s)  Diet Patient reports consuming 1 meals a day and 2 snack(s) a day Patient reports that her primary diet is: Regular Patient reports that she does have regular access to food.   Depression Screen    11/18/2022    8:14 AM 09/10/2022    2:28 PM 12/28/2021    3:14 PM 08/20/2021    2:01 PM 02/09/2019    1:39 PM 03/10/2018    3:48 PM 01/06/2018    1:48 PM  PHQ 2/9 Scores  PHQ - 2 Score 0 0 4 0 2 2 2  $ PHQ- 9 Score   9 0 10 8 11     $ Fall Risk    11/18/2022    8:13 AM 09/10/2022    2:27 PM 08/20/2021    2:01 PM 06/08/2019    6:48 AM 03/10/2018    3:48 PM  Fall Risk   Falls in the past year? 0 0 1  No  Number falls in past yr: 0 0 1    Injury with Fall? 0 0 1    Risk for  fall due to : Impaired mobility No Fall Risks Impaired balance/gait;Impaired mobility;History of fall(s) Impaired balance/gait;Impaired mobility   Follow up Falls evaluation completed;Education provided;Falls prevention discussed Falls evaluation completed Education provided;Falls prevention discussed;Falls evaluation completed Education provided      Objective:  DIEDRE HELLING seemed alert and oriented and she participated appropriately during our telephone visit.  Blood Pressure Weight BMI  BP Readings from Last 3 Encounters:  10/16/22 134/72  09/11/22 124/83  09/10/22 116/69   Wt Readings from Last 3 Encounters:  09/11/22 198 lb 6.4 oz (90 kg)  09/10/22 197 lb 0.6 oz (89.4 kg)  08/08/22 193 lb (87.5 kg)   BMI Readings from Last 1 Encounters:  09/11/22 32.02 kg/m    *Unable to obtain current vital signs, weight, and BMI due to telephone visit type  Hearing/Vision  Ismelda did not seem to have difficulty with hearing/understanding during the telephone conversation Reports that she has not had a  formal eye exam by an eye care professional within the past year Reports that she has not had a formal hearing evaluation within the past year *Unable to fully assess hearing and vision during telephone visit type  Cognitive Function:    11/18/2022    8:18 AM 08/20/2021    2:17 PM  6CIT Screen  What Year? 0 points 0 points  What month? 0 points 0 points  What time? 0 points 0 points  Count back from 20 0 points 2 points  Months in reverse 0 points 0 points  Repeat phrase 0 points 0 points  Total Score 0 points 2 points   (Normal:0-7, Significant for Dysfunction: >8)  Normal Cognitive Function Screening: Yes   Immunization & Health Maintenance Record Immunization History  Administered Date(s) Administered   Influenza Split 06/27/2011   Influenza Whole 07/15/2007   Influenza,inj,Quad PF,6+ Mos 07/31/2016   Janssen (J&J) SARS-COV-2 Vaccination 02/04/2020   PFIZER(Purple  Top)SARS-COV-2 Vaccination 11/07/2020   Tdap 12/21/2014    Health Maintenance  Topic Date Due   OPHTHALMOLOGY EXAM  11/18/2022 (Originally 05/14/1966)   Diabetic kidney evaluation - Urine ACR  11/19/2022 (Originally 12/13/2009)   FOOT EXAM  11/19/2022 (Originally 05/14/1966)   COVID-19 Vaccine (3 - 2023-24 season) 12/04/2022 (Originally 06/07/2022)   Zoster Vaccines- Shingrix (1 of 2) 12/10/2022 (Originally 05/13/1976)   HEMOGLOBIN A1C  12/17/2022 (Originally 10/15/2017)   INFLUENZA VACCINE  01/05/2023 (Originally 05/07/2022)   DEXA SCAN  09/11/2023 (Originally 05/13/2022)   Pneumonia Vaccine 12+ Years old (1 of 1 - PCV) 11/19/2023 (Originally 05/13/2022)   MAMMOGRAM  10/04/2023   Diabetic kidney evaluation - eGFR measurement  11/01/2023   Medicare Annual Wellness (AWV)  11/19/2023   DTaP/Tdap/Td (2 - Td or Tdap) 12/20/2024   COLONOSCOPY (Pts 45-35yr Insurance coverage will need to be confirmed)  07/25/2029   Hepatitis C Screening  Completed   HIV Screening  Completed   HPV VACCINES  Aged Out       Assessment  This is a routine wellness examination for [facility]  Health Maintenance: Due or Overdue There are no preventive care reminders to display for this patient.   REgbert Garibaldidoes not need a referral for Community Assistance: Care Management:   no Social Work:    no Prescription Assistance:  no Nutrition/Diabetes Education:  no   Plan:  Personalized Goals  Goals Addressed               This Visit's Progress     Patient Stated (pt-stated)        Patient stated that she would like to be able to walk without being in pain.       Personalized Health Maintenance & Screening Recommendations  Pneumococcal vaccine  Foot exam  Diabetic eye exam Urine ACR Hemoglobin A1C Shingrix vaccine Dexa scan Mammogram   Lung Cancer Screening Recommended: no (Low Dose CT Chest recommended if Age 66-80years, 30 pack-year currently smoking OR have quit w/in past 15  years) Hepatitis C Screening recommended: no HIV Screening recommended: no  Advanced Directives: Written information was not prepared per patient's request.  Referrals & Orders Orders Placed This Encounter  Procedures   DEXAScan   Mammogram 3D SCREEN BREAST BILATERAL    Follow-up Plan Follow-up with MHali Marry MD as planned Schedule shingles vaccine at the pharmacy.  Schedule your eye exam.  Discuss Foot exam, Urine ACR, Pneumococcal vaccine and Hemoglobin A1C with PCP. Medicare wellness visit in one year.  Patient  will access AVS on my chart.   I have personally reviewed and noted the following in the patient's chart:   Medical and social history Use of alcohol, tobacco or illicit drugs  Current medications and supplements Functional ability and status Nutritional status Physical activity Advanced directives List of other physicians Hospitalizations, surgeries, and ER visits in previous 12 months Vitals Screenings to include cognitive, depression, and falls Referrals and appointments  In addition, I have reviewed and discussed with [name] certain preventive protocols, quality metrics, and best practice recommendations. A written personalized care plan for preventive services as well as general preventive health recommendations is available and can be mailed to the patient at her request.      Tinnie Gens, RN BSN  11/18/2022

## 2022-12-11 ENCOUNTER — Ambulatory Visit (INDEPENDENT_AMBULATORY_CARE_PROVIDER_SITE_OTHER): Payer: Medicare Other | Admitting: Podiatry

## 2022-12-11 DIAGNOSIS — M7661 Achilles tendinitis, right leg: Secondary | ICD-10-CM

## 2022-12-11 MED ORDER — METHYLPREDNISOLONE 4 MG PO TBPK: ORAL_TABLET | ORAL | 0 refills | Status: DC

## 2022-12-11 MED ORDER — LIDOCAINE 5 % EX OINT: 1.0000 | TOPICAL_OINTMENT | CUTANEOUS | 1 refills | Status: AC | PRN

## 2022-12-11 MED ORDER — MELOXICAM 15 MG PO TABS: 15.0000 mg | ORAL_TABLET | Freq: Every day | ORAL | 0 refills | Status: AC

## 2022-12-11 MED ORDER — LIDOCAINE 5 % EX OINT: 1.0000 | TOPICAL_OINTMENT | CUTANEOUS | 0 refills | Status: AC | PRN

## 2022-12-11 NOTE — Progress Notes (Signed)
Subjective:  Patient ID: [name], female    DOB: 1956-03-04,  MRN: [id number]  Chief Complaint  Patient presents with   Routine Post Op    POV #3 DOS 09/23/2022 LT EXCISION OF BENIGN SKIN LESION    67 y.o. female presents with the above complaint.  Patient presents with new complaint of right Achilles tendon insertional pain.  Patient states is painful to touch is progressive gotten worse hurts with ambulation hurts with pressure.  She wanted to discuss treatment options for it.  She states it hurts with ambulation hurts right in the back.  She has not taken any medication for it she has not gotten an injection in the past.  Pain scale is 5 out of 10.  Hurts with ambulation and hurts with pressure.  She is due healing well from the surgical site.   Review of Systems: Negative except as noted in the HPI. Denies N/V/F/Ch.  Past Medical History:  Diagnosis Date   Allergy    Anemia    Anxiety    Bipolar disorder (HCC)    CHF (congestive heart failure) (HCC)    Chronic diastolic heart failure ([place])    hospitalized at [facility]   Closed nondisplaced fracture of phalanx of toe of left foot [id number]   Complication of anesthesia    had "breathing problems and ended up in ICU" 03/2010 report on chart   COPD (chronic obstructive pulmonary disease) ([place])    DDD (degenerative disc disease), lumbar    Depression    Diabetes in pregnancy    diet cnontrolled   GERD (gastroesophageal reflux disease)    Glucose intolerance (impaired glucose tolerance)    HLD (hyperlipidemia)    nec/nos   HTN (hypertension)    benign essential   MVP (mitral valve prolapse)    Narcolepsy    Stroke ([place])    Suicide attempt ([place])    hx   Syncope    admx to [facility] in 2/12: Echo with normal LVF; head CT unremarkable, ECG stable, no further w/u   Ulcer     Current Outpatient Medications:    lidocaine (XYLOCAINE) 5 % ointment, Apply 1 Application topically as needed., Disp: 35.44 g, Rfl: 0    meloxicam (MOBIC) 15 MG tablet, Take 1 tablet (15 mg total) by mouth daily., Disp: 30 tablet, Rfl: 0   methylPREDNISolone (MEDROL DOSEPAK) 4 MG TBPK tablet, Take as directed, Disp: 21 each, Rfl: 0   AMBULATORY NON FORMULARY MEDICATION, Walker. Use as needed. Disp 1.  Unable gait R26.81, Disp: 1 each, Rfl: 0   amitriptyline (ELAVIL) 10 MG tablet, Take by mouth., Disp: , Rfl:    amLODipine-olmesartan (AZOR) 10-40 MG tablet, Take 1 tablet by mouth daily., Disp: 30 tablet, Rfl: 11   ASPIRIN LOW DOSE 81 MG tablet, Take 1 tablet by mouth every day, Disp: 30 tablet, Rfl: 11   atorvastatin (LIPITOR) 80 MG tablet, Take 1 tablet (80 mg total) by mouth daily., Disp: 30 tablet, Rfl: 11   busPIRone (BUSPAR) 10 MG tablet, Take 1 tablet by mouth 3 times a day, Disp: 90 tablet, Rfl: 11   cloNIDine (CATAPRES - DOSED IN MG/24 HR) 0.1 mg/24hr patch, Place 1 patch on the skin once a week, Disp: 4 patch, Rfl: 11   cloNIDine (CATAPRES - DOSED IN MG/24 HR) 0.3 mg/24hr patch, Place 1 patch (0.3 mg total) onto the skin once a week., Disp: 12 patch, Rfl: 1   clopidogrel (PLAVIX) 75 MG tablet, Take 1  tablet (75 mg total) by mouth daily., Disp: 90 tablet, Rfl: 3   diclofenac Sodium (VOLTAREN) 1 % GEL, Apply 4 grams to the skin four times a day, Disp: 500 g, Rfl: 11   DULoxetine (CYMBALTA) 60 MG capsule, Take 1 capsule by mouth every evening, Disp: 30 capsule, Rfl: 11   esomeprazole (NEXIUM) 40 MG capsule, Take 40 mg by mouth daily., Disp: , Rfl:    finasteride (PROPECIA) 1 MG tablet, Take by mouth., Disp: , Rfl:    furosemide (LASIX) 40 MG tablet, Take 1 tablet (40 mg total) by mouth 2 (two) times daily., Disp: 60 tablet, Rfl: 11   gabapentin (NEURONTIN) 800 MG tablet, Take 1 tablet by mouth 3 times a day, Disp: 90 tablet, Rfl: 11   hydrALAZINE (APRESOLINE) 100 MG tablet, Take 1 tablet (100 mg total) by mouth 3 (three) times daily., Disp: 90 tablet, Rfl: 11   ibuprofen (ADVIL) 800 MG tablet, Take 1 tablet (800 mg total) by  mouth every 6 (six) hours as needed., Disp: 60 tablet, Rfl: 1   labetalol (NORMODYNE) 300 MG tablet, Take 2 tablets by mouth twice daily, Disp: 120 tablet, Rfl: 11   lidocaine (XYLOCAINE) 5 % ointment, Apply 1 Application topically as needed for mild pain., Disp: 35.44 g, Rfl: 1   MINOXIDIL, TOPICAL, 5 % SOLN, Apply 1 application topically daily., Disp: 120 mL, Rfl: PRN   Multiple Vitamins-Minerals (MULTIVITAMINS THER. W/MINERALS) TABS, Take 1 tablet by mouth daily., Disp: , Rfl:    nitroGLYCERIN (NITRODUR - DOSED IN MG/24 HR) 0.2 mg/hr patch, Apply 1/4 patch daily to tendon for tendonitis, Disp: 0.2 patch, Rfl: 11   oxyCODONE-acetaminophen (PERCOCET) 5-325 MG tablet, Take 1 tablet by mouth every 4 (four) hours as needed for severe pain., Disp: 30 tablet, Rfl: 0   spironolactone (ALDACTONE) 50 MG tablet, Take 1 tablet (50 mg total) by mouth daily., Disp: 30 tablet, Rfl: 11   traZODone (DESYREL) 50 MG tablet, Take 50 mg by mouth at bedtime., Disp: , Rfl:    VENTOLIN HFA 108 (90 BASE) MCG/ACT inhaler, prn, Disp: , Rfl:    vitamin B-12 (CYANOCOBALAMIN) 500 MCG tablet, Take by mouth., Disp: , Rfl:   Social History   Tobacco Use  Smoking Status Former   Types: Cigarettes   Quit date: 10/08/2003   Years since quitting: 19.2  Smokeless Tobacco Never    Allergies  Allergen Reactions   Codeine Nausea And Vomiting and Nausea Only   Objective:  There were no vitals filed for this visit. There is no height or weight on file to calculate BMI. Constitutional Well developed. Well nourished.  Vascular Dorsalis pedis pulses palpable bilaterally. Posterior tibial pulses palpable bilaterally. Capillary refill normal to all digits.  No cyanosis or clubbing noted. Pedal hair growth normal.  Neurologic Normal speech. Oriented to person, place, and time. Epicritic sensation to light touch grossly present bilaterally.  Dermatologic Nails well groomed and normal in appearance. No open wounds. No skin  lesions.  Orthopedic: Pain on palpation right Achilles tendon insertional pain pain with dorsiflexion of the ankle joint no Pain with plantarflexion of the ankle joint positive Haglund's deformity noted positive Silfverskiold test noted gastrocnemius equinus   Radiographs: None Assessment:  No diagnosis found. Plan:  Patient was evaluated and treated and all questions answered.  Right Achilles tendinitis -All questions and concerns were discussed with the patient in extensive detail -Given the amount of pain that she is experienced she should benefit from steroid injection to help decrease  inflammatory component associate with pain.  Patient agrees with plan like to proceed with steroid injection -A steroid injection was performed at [facility] fat [facility] using 1% plain Lidocaine and 10 mg of Kenalog. This was well tolerated. -Medrol Dosepak and meloxicam was also sent to the pharmacy  No follow-ups on file.   Right Achilles tendinitis injection Medrol Dosepak meloxicam  Left side healed

## 2022-12-25 ENCOUNTER — Encounter: Payer: Self-pay | Admitting: Family Medicine

## 2022-12-25 ENCOUNTER — Ambulatory Visit (INDEPENDENT_AMBULATORY_CARE_PROVIDER_SITE_OTHER): Payer: Medicare Other

## 2022-12-25 DIAGNOSIS — Z1231 Encounter for screening mammogram for malignant neoplasm of breast: Secondary | ICD-10-CM

## 2022-12-25 DIAGNOSIS — M8580 Other specified disorders of bone density and structure, unspecified site: Secondary | ICD-10-CM

## 2022-12-25 DIAGNOSIS — Z780 Asymptomatic menopausal state: Secondary | ICD-10-CM | POA: Diagnosis not present

## 2022-12-25 DIAGNOSIS — Z0000 Encounter for general adult medical examination without abnormal findings: Secondary | ICD-10-CM

## 2022-12-25 DIAGNOSIS — M85852 Other specified disorders of bone density and structure, left thigh: Secondary | ICD-10-CM | POA: Diagnosis not present

## 2022-12-25 HISTORY — DX: Other specified disorders of bone density and structure, unspecified site: M85.80

## 2022-12-25 NOTE — Progress Notes (Signed)
Ndia, bone density test shows a T-score of -1.7 this is considered osteopenia which is mildly thin bones.   The current recommendation for osteopenia (mildly thin bones) treatment includes:   #1 calcium-total of 1200 mg of calcium daily.  If you eat a very calcium rich diet you may be able to obtain that without a supplement.  If not, then I recommend calcium 500 mg twice a day.  There are several products over-the-counter such as Caltrate D and Viactiv chews which are great options that contain calcium and vitamin D. #2 vitamin D-recommend 800 international units daily. #3 exercise-recommend 30 minutes of weightbearing exercise 3 days a week.  Resistance training ,such as doing bands and light weights, can be particularly helpful.

## 2022-12-26 NOTE — Progress Notes (Signed)
Please call patient. Normal mammogram.  Repeat in 1 year.  

## 2022-12-28 ENCOUNTER — Other Ambulatory Visit: Payer: Self-pay | Admitting: Cardiology

## 2022-12-28 DIAGNOSIS — I10 Essential (primary) hypertension: Secondary | ICD-10-CM

## 2023-01-01 ENCOUNTER — Ambulatory Visit (INDEPENDENT_AMBULATORY_CARE_PROVIDER_SITE_OTHER): Payer: Medicare Other | Admitting: Podiatry

## 2023-01-01 DIAGNOSIS — M7661 Achilles tendinitis, right leg: Secondary | ICD-10-CM

## 2023-01-01 NOTE — Progress Notes (Signed)
Subjective:  Patient ID: [name], female    DOB: 1956-08-17,  MRN: [id number]  Chief Complaint  Patient presents with   Routine Post Op    67 y.o. female presents with the above complaint.  Patient presents with fall of Achilles tendinitis.  She states she is doing a lot better.  Cam boot immobilization injection helped some.  She states that the left heel is healing good as well.  She denies any other acute complaints.   Review of Systems: Negative except as noted in the HPI. Denies N/V/F/Ch.  Past Medical History:  Diagnosis Date   Allergy    Anemia    Anxiety    Bipolar disorder (HCC)    CHF (congestive heart failure) (HCC)    Chronic diastolic heart failure ([place])    hospitalized at [facility]   Closed nondisplaced fracture of phalanx of toe of left foot [id number]   Complication of anesthesia    had "breathing problems and ended up in ICU" 03/2010 report on chart   COPD (chronic obstructive pulmonary disease) ([place])    DDD (degenerative disc disease), lumbar    Depression    Diabetes in pregnancy    diet cnontrolled   GERD (gastroesophageal reflux disease)    Glucose intolerance (impaired glucose tolerance)    HLD (hyperlipidemia)    nec/nos   HTN (hypertension)    benign essential   MVP (mitral valve prolapse)    Narcolepsy    Stroke ([place])    Suicide attempt ([place])    hx   Syncope    admx to [facility] in 2/12: Echo with normal LVF; head CT unremarkable, ECG stable, no further w/u   Ulcer     Current Outpatient Medications:    AMBULATORY NON FORMULARY MEDICATION, Walker. Use as needed. Disp 1.  Unable gait R26.81, Disp: 1 each, Rfl: 0   amitriptyline (ELAVIL) 10 MG tablet, Take by mouth., Disp: , Rfl:    amLODipine-olmesartan (AZOR) 10-40 MG tablet, Take 1 tablet by mouth daily., Disp: 30 tablet, Rfl: 11   ASPIRIN LOW DOSE 81 MG tablet, Take 1 tablet by mouth every day, Disp: 30 tablet, Rfl: 11   atorvastatin (LIPITOR) 80 MG tablet, Take 1 tablet (80 mg  total) by mouth daily., Disp: 30 tablet, Rfl: 11   busPIRone (BUSPAR) 10 MG tablet, Take 1 tablet by mouth 3 times a day, Disp: 90 tablet, Rfl: 11   cloNIDine (CATAPRES - DOSED IN MG/24 HR) 0.1 mg/24hr patch, Place 1 patch on the skin once a week, Disp: 4 patch, Rfl: 11   cloNIDine (CATAPRES - DOSED IN MG/24 HR) 0.3 mg/24hr patch, Place 1 patch (0.3 mg total) onto the skin once a week., Disp: 12 patch, Rfl: 1   clopidogrel (PLAVIX) 75 MG tablet, Take 1 tablet (75 mg total) by mouth daily., Disp: 90 tablet, Rfl: 3   diclofenac Sodium (VOLTAREN) 1 % GEL, Apply 4 grams to the skin four times a day, Disp: 500 g, Rfl: 11   DULoxetine (CYMBALTA) 60 MG capsule, Take 1 capsule by mouth every evening, Disp: 30 capsule, Rfl: 11   esomeprazole (NEXIUM) 40 MG capsule, Take 40 mg by mouth daily., Disp: , Rfl:    finasteride (PROPECIA) 1 MG tablet, Take by mouth., Disp: , Rfl:    furosemide (LASIX) 40 MG tablet, Take 1 tablet (40 mg total) by mouth 2 (two) times daily., Disp: 60 tablet, Rfl: 11   gabapentin (NEURONTIN) 800 MG tablet, Take 1 tablet by  mouth 3 times a day, Disp: 90 tablet, Rfl: 11   hydrALAZINE (APRESOLINE) 100 MG tablet, Take 1 tablet (100 mg total) by mouth 3 (three) times daily., Disp: 90 tablet, Rfl: 11   ibuprofen (ADVIL) 800 MG tablet, Take 1 tablet (800 mg total) by mouth every 6 (six) hours as needed., Disp: 60 tablet, Rfl: 1   labetalol (NORMODYNE) 300 MG tablet, Take 2 tablets by mouth twice daily, Disp: 120 tablet, Rfl: 11   lidocaine (XYLOCAINE) 5 % ointment, Apply 1 Application topically as needed., Disp: 35.44 g, Rfl: 0   lidocaine (XYLOCAINE) 5 % ointment, Apply 1 Application topically as needed for mild pain., Disp: 35.44 g, Rfl: 1   meloxicam (MOBIC) 15 MG tablet, Take 1 tablet (15 mg total) by mouth daily., Disp: 30 tablet, Rfl: 0   methylPREDNISolone (MEDROL DOSEPAK) 4 MG TBPK tablet, Take as directed, Disp: 21 each, Rfl: 0   MINOXIDIL, TOPICAL, 5 % SOLN, Apply 1 application  topically daily., Disp: 120 mL, Rfl: PRN   Multiple Vitamins-Minerals (MULTIVITAMINS THER. W/MINERALS) TABS, Take 1 tablet by mouth daily., Disp: , Rfl:    nitroGLYCERIN (NITRODUR - DOSED IN MG/24 HR) 0.2 mg/hr patch, Apply 1/4 patch daily to tendon for tendonitis, Disp: 0.2 patch, Rfl: 11   oxyCODONE-acetaminophen (PERCOCET) 5-325 MG tablet, Take 1 tablet by mouth every 4 (four) hours as needed for severe pain., Disp: 30 tablet, Rfl: 0   spironolactone (ALDACTONE) 50 MG tablet, Take 1 tablet (50 mg total) by mouth daily., Disp: 30 tablet, Rfl: 11   traZODone (DESYREL) 50 MG tablet, Take 50 mg by mouth at bedtime., Disp: , Rfl:    VENTOLIN HFA 108 (90 BASE) MCG/ACT inhaler, prn, Disp: , Rfl:    vitamin B-12 (CYANOCOBALAMIN) 500 MCG tablet, Take by mouth., Disp: , Rfl:   Social History   Tobacco Use  Smoking Status Former   Types: Cigarettes   Quit date: 10/08/2003   Years since quitting: 19.2  Smokeless Tobacco Never    Allergies  Allergen Reactions   Codeine Nausea And Vomiting and Nausea Only   Objective:  There were no vitals filed for this visit. There is no height or weight on file to calculate BMI. Constitutional Well developed. Well nourished.  Vascular Dorsalis pedis pulses palpable bilaterally. Posterior tibial pulses palpable bilaterally. Capillary refill normal to all digits.  No cyanosis or clubbing noted. Pedal hair growth normal.  Neurologic Normal speech. Oriented to person, place, and time. Epicritic sensation to light touch grossly present bilaterally.  Dermatologic Nails well groomed and normal in appearance. No open wounds. No skin lesions.  Orthopedic: No further pain on palpation right Achilles tendon insertional pain pain with dorsiflexion of the ankle joint no Pain with plantarflexion of the ankle joint positive Haglund's deformity noted positive Silfverskiold test noted gastrocnemius equinus   Radiographs: None Assessment:  No diagnosis found. Plan:   Patient was evaluated and treated and all questions answered.  Right Achilles tendinitis -All questions and concerns were discussed with the patient in extensive detail -Clinically healed and is doing much better.  At this time I discussed with her that if any foot and ankle issues or in the future she will come back and see me.  Heel pads were dispensed.  I discussed shoe gear modification and offloading.   No follow-ups on file.   Right Achilles tendinitis injection Medrol Dosepak meloxicam  Left side healed

## 2023-01-11 ENCOUNTER — Other Ambulatory Visit: Payer: Self-pay | Admitting: Cardiology

## 2023-01-20 ENCOUNTER — Other Ambulatory Visit: Payer: Self-pay | Admitting: Family Medicine

## 2023-01-28 DIAGNOSIS — Z1330 Encounter for screening examination for mental health and behavioral disorders, unspecified: Secondary | ICD-10-CM | POA: Diagnosis not present

## 2023-01-28 DIAGNOSIS — I509 Heart failure, unspecified: Secondary | ICD-10-CM | POA: Diagnosis not present

## 2023-01-28 DIAGNOSIS — F319 Bipolar disorder, unspecified: Secondary | ICD-10-CM | POA: Diagnosis not present

## 2023-01-28 DIAGNOSIS — I6930 Unspecified sequelae of cerebral infarction: Secondary | ICD-10-CM | POA: Diagnosis not present

## 2023-01-28 DIAGNOSIS — E1169 Type 2 diabetes mellitus with other specified complication: Secondary | ICD-10-CM | POA: Diagnosis not present

## 2023-01-28 DIAGNOSIS — R531 Weakness: Secondary | ICD-10-CM | POA: Diagnosis not present

## 2023-02-15 DIAGNOSIS — I6381 Other cerebral infarction due to occlusion or stenosis of small artery: Secondary | ICD-10-CM | POA: Diagnosis not present

## 2023-02-15 DIAGNOSIS — I6930 Unspecified sequelae of cerebral infarction: Secondary | ICD-10-CM | POA: Diagnosis not present

## 2023-02-15 DIAGNOSIS — R531 Weakness: Secondary | ICD-10-CM | POA: Diagnosis not present

## 2023-02-17 ENCOUNTER — Other Ambulatory Visit: Payer: Self-pay | Admitting: Family Medicine

## 2023-02-23 DIAGNOSIS — I639 Cerebral infarction, unspecified: Secondary | ICD-10-CM | POA: Diagnosis not present

## 2023-02-24 ENCOUNTER — Other Ambulatory Visit: Payer: Self-pay | Admitting: Cardiology

## 2023-03-10 ENCOUNTER — Telehealth: Payer: Self-pay | Admitting: Podiatry

## 2023-03-10 NOTE — Telephone Encounter (Signed)
Patient called this afternoon, noting she has a hard "knot or mass on one side" and it's very painful.  States the Lidocaine ointment is no longer helping the pain in the area.  (Last note was for achilles pain)  States she can hardly walk on the foot now.    Not sure if she needs seen for this or if something needs called in.  Last seen 01/01/23.

## 2023-03-11 MED ORDER — TRAMADOL HCL 50 MG PO TABS: 50.0000 mg | ORAL_TABLET | Freq: Three times a day (TID) | ORAL | 0 refills | Status: AC | PRN

## 2023-03-11 NOTE — Addendum Note (Signed)
Addended by: Nicholes Rough on: 03/11/2023 08:10 AM   Modules accepted: Orders

## 2023-03-12 ENCOUNTER — Ambulatory Visit: Payer: Medicare Other | Admitting: Podiatry

## 2023-03-14 ENCOUNTER — Ambulatory Visit (INDEPENDENT_AMBULATORY_CARE_PROVIDER_SITE_OTHER): Payer: Medicare Other

## 2023-03-14 ENCOUNTER — Ambulatory Visit (INDEPENDENT_AMBULATORY_CARE_PROVIDER_SITE_OTHER): Payer: Medicare Other | Admitting: Podiatry

## 2023-03-14 DIAGNOSIS — M778 Other enthesopathies, not elsewhere classified: Secondary | ICD-10-CM

## 2023-03-14 MED ORDER — SALICYLIC ACID 17 % EX GEL: Freq: Every day | CUTANEOUS | 0 refills | Status: DC

## 2023-03-14 MED ORDER — UREA 40 % EX CREA: 1.0000 | TOPICAL_CREAM | Freq: Two times a day (BID) | CUTANEOUS | 0 refills | Status: AC

## 2023-03-14 NOTE — Progress Notes (Signed)
Subjective:  Patient ID: [name], female    DOB: [birth year],  MRN: [id number]  Chief Complaint  Patient presents with   Foot Pain    Patient states that her left foot has been in pain since surgery. She has to knots on her foot. She has tried cream, palms and rock and nothing has worked. Aggravating factors are any movement with the foot.  patient states that it stings and burns. She also can put weight on her foot     67 y.o. female presents with the above complaint.  Patient presents with new left heel porokeratotic lesion near the previous excision.  Patient states painful to touch is progressive gotten worse worse with ambulation worse with pressure rest of HPI as above   Review of Systems: Negative except as noted in the HPI. Denies N/V/F/Ch.  Past Medical History:  Diagnosis Date   Allergy    Anemia    Anxiety    Bipolar disorder (HCC)    CHF (congestive heart failure) (HCC)    Chronic diastolic heart failure (HCC)    hospitalized at [facility]   Closed nondisplaced fracture of phalanx of toe of left foot 12/07/2020   Complication of anesthesia    had "breathing problems and ended up in ICU" 03/2010 report on chart   COPD (chronic obstructive pulmonary disease) (HCC)    DDD (degenerative disc disease), lumbar    Depression    Diabetes in pregnancy    diet cnontrolled   GERD (gastroesophageal reflux disease)    Glucose intolerance (impaired glucose tolerance)    HLD (hyperlipidemia)    nec/nos   HTN (hypertension)    benign essential   MVP (mitral valve prolapse)    Narcolepsy    Stroke (HCC)    Suicide attempt (HCC)    hx   Syncope    admx to [facility] in 2/12: Echo with normal LVF; head CT unremarkable, ECG stable, no further w/u   Ulcer     Current Outpatient Medications:    salicylic acid 17 % gel, Apply topically daily., Disp: 15 g, Rfl: 0   urea (CARMOL) 40 % CREA, Apply 1 Application topically 2 (two) times daily., Disp: 120 g, Rfl: 0    AMBULATORY NON FORMULARY MEDICATION, Walker. Use as needed. Disp 1.  Unable gait R26.81, Disp: 1 each, Rfl: 0   amitriptyline (ELAVIL) 10 MG tablet, Take 2 tablets by mouth every evening, Disp: 60 tablet, Rfl: 11   amLODipine-olmesartan (AZOR) 10-40 MG tablet, Take 1 tablet by mouth daily., Disp: 30 tablet, Rfl: 11   ASPIRIN LOW DOSE 81 MG tablet, Take 1 tablet by mouth every day, Disp: 30 tablet, Rfl: 11   atorvastatin (LIPITOR) 80 MG tablet, Take 1 tablet (80 mg total) by mouth daily., Disp: 30 tablet, Rfl: 11   busPIRone (BUSPAR) 10 MG tablet, Take 1 tablet by mouth 3 times a day, Disp: 90 tablet, Rfl: 11   cloNIDine (CATAPRES - DOSED IN MG/24 HR) 0.1 mg/24hr patch, Place 1 patch on the skin once a week, Disp: 4 patch, Rfl: 11   cloNIDine (CATAPRES - DOSED IN MG/24 HR) 0.3 mg/24hr patch, Place 1 patch onto the skin once a week, Disp: 4 patch, Rfl: 11   clopidogrel (PLAVIX) 75 MG tablet, Take 1 tablet (75 mg total) by mouth daily., Disp: 90 tablet, Rfl: 3   diclofenac Sodium (VOLTAREN) 1 % GEL, Apply 4 grams to the skin four times a day, Disp: 500 g, Rfl: 11  DULoxetine (CYMBALTA) 60 MG capsule, Take 1 capsule by mouth every evening, Disp: 30 capsule, Rfl: 11   esomeprazole (NEXIUM) 40 MG capsule, Take 40 mg by mouth daily., Disp: , Rfl:    finasteride (PROPECIA) 1 MG tablet, Take by mouth., Disp: , Rfl:    furosemide (LASIX) 40 MG tablet, Take 1 tablet (40 mg total) by mouth 2 (two) times daily., Disp: 60 tablet, Rfl: 11   gabapentin (NEURONTIN) 800 MG tablet, Take 1 tablet by mouth 3 times a day, Disp: 90 tablet, Rfl: 11   hydrALAZINE (APRESOLINE) 100 MG tablet, Take 1 tablet (100 mg total) by mouth 3 (three) times daily., Disp: 90 tablet, Rfl: 11   ibuprofen (ADVIL) 800 MG tablet, Take 1 tablet (800 mg total) by mouth every 6 (six) hours as needed., Disp: 60 tablet, Rfl: 1   labetalol (NORMODYNE) 300 MG tablet, Take 2 tablets (600 mg total) by mouth 2 (two) times daily., Disp: 120 tablet, Rfl:  7   lidocaine (XYLOCAINE) 5 % ointment, Apply 1 Application topically as needed., Disp: 35.44 g, Rfl: 0   methylPREDNISolone (MEDROL DOSEPAK) 4 MG TBPK tablet, Take as directed, Disp: 21 each, Rfl: 0   MINOXIDIL, TOPICAL, 5 % SOLN, Apply 1 application topically daily., Disp: 120 mL, Rfl: PRN   Multiple Vitamins-Minerals (MULTIVITAMINS THER. W/MINERALS) TABS, Take 1 tablet by mouth daily., Disp: , Rfl:    nitroGLYCERIN (NITRODUR - DOSED IN MG/24 HR) 0.2 mg/hr patch, Apply 1/4 patch daily to tendon for tendonitis, Disp: 0.2 patch, Rfl: 11   oxyCODONE-acetaminophen (PERCOCET) 5-325 MG tablet, Take 1 tablet by mouth every 4 (four) hours as needed for severe pain., Disp: 30 tablet, Rfl: 0   spironolactone (ALDACTONE) 50 MG tablet, Take 1 tablet (50 mg total) by mouth daily., Disp: 30 tablet, Rfl: 11   traZODone (DESYREL) 50 MG tablet, Take 50 mg by mouth at bedtime., Disp: , Rfl:    VENTOLIN HFA 108 (90 BASE) MCG/ACT inhaler, prn, Disp: , Rfl:    vitamin B-12 (CYANOCOBALAMIN) 500 MCG tablet, Take by mouth., Disp: , Rfl:   Social History   Tobacco Use  Smoking Status Former   Types: Cigarettes   Quit date: 10/08/2003   Years since quitting: 19.4  Smokeless Tobacco Never    Allergies  Allergen Reactions   Codeine Nausea And Vomiting and Nausea Only   Objective:  There were no vitals filed for this visit. There is no height or weight on file to calculate BMI. Constitutional Well developed. Well nourished.  Vascular Dorsalis pedis pulses palpable bilaterally. Posterior tibial pulses palpable bilaterally. Capillary refill normal to all digits.  No cyanosis or clubbing noted. Pedal hair growth normal.  Neurologic Normal speech. Oriented to person, place, and time. Epicritic sensation to light touch grossly present bilaterally.  Dermatologic Left heel porokeratotic lesion painful to touch.  Central nucleated core noted.  Underlying capsulitis noted on  Orthopedic: Normal joint ROM without  pain or crepitus bilaterally. No visible deformities. No bony tenderness.   Radiographs: None Assessment:   1. Capsulitis of left foot    Plan:  Patient was evaluated and treated and all questions answered.  Left heel porokeratosis with underlying capsulitis -All questions and concerns were discussed with the patient in extensive detail given the amount of pain that she is having she will benefit from a steroid injection of decrease inflammatory component associated with pain near the lesion -Patient agrees with plan like to proceed with steroid injection -A steroid injection was performed at  left heel using 1% plain Lidocaine and 10 mg of Kenalog. This was well tolerated.   No follow-ups on file.

## 2023-04-05 ENCOUNTER — Other Ambulatory Visit: Payer: Self-pay | Admitting: Family Medicine

## 2023-05-31 ENCOUNTER — Other Ambulatory Visit: Payer: Self-pay | Admitting: Family Medicine

## 2023-07-14 ENCOUNTER — Other Ambulatory Visit: Payer: Self-pay | Admitting: Cardiology

## 2023-07-14 DIAGNOSIS — I10 Essential (primary) hypertension: Secondary | ICD-10-CM

## 2023-08-06 ENCOUNTER — Other Ambulatory Visit: Payer: Self-pay | Admitting: Family Medicine

## 2023-08-06 DIAGNOSIS — E1169 Type 2 diabetes mellitus with other specified complication: Secondary | ICD-10-CM | POA: Diagnosis not present

## 2023-08-06 DIAGNOSIS — F319 Bipolar disorder, unspecified: Secondary | ICD-10-CM | POA: Diagnosis not present

## 2023-08-06 DIAGNOSIS — R531 Weakness: Secondary | ICD-10-CM | POA: Diagnosis not present

## 2023-08-06 DIAGNOSIS — I509 Heart failure, unspecified: Secondary | ICD-10-CM | POA: Diagnosis not present

## 2023-09-22 DIAGNOSIS — Z8673 Personal history of transient ischemic attack (TIA), and cerebral infarction without residual deficits: Secondary | ICD-10-CM | POA: Diagnosis not present

## 2023-09-22 DIAGNOSIS — I509 Heart failure, unspecified: Secondary | ICD-10-CM | POA: Diagnosis not present

## 2023-09-22 DIAGNOSIS — Z9884 Bariatric surgery status: Secondary | ICD-10-CM | POA: Diagnosis not present

## 2023-09-22 DIAGNOSIS — I110 Hypertensive heart disease with heart failure: Secondary | ICD-10-CM | POA: Diagnosis not present

## 2023-09-22 DIAGNOSIS — S92511A Displaced fracture of proximal phalanx of right lesser toe(s), initial encounter for closed fracture: Secondary | ICD-10-CM | POA: Diagnosis not present

## 2023-09-22 DIAGNOSIS — Z885 Allergy status to narcotic agent status: Secondary | ICD-10-CM | POA: Diagnosis not present

## 2023-09-22 DIAGNOSIS — Z7982 Long term (current) use of aspirin: Secondary | ICD-10-CM | POA: Diagnosis not present

## 2023-09-22 DIAGNOSIS — Z7902 Long term (current) use of antithrombotics/antiplatelets: Secondary | ICD-10-CM | POA: Diagnosis not present

## 2023-09-22 DIAGNOSIS — K219 Gastro-esophageal reflux disease without esophagitis: Secondary | ICD-10-CM | POA: Diagnosis not present

## 2023-09-22 DIAGNOSIS — M7731 Calcaneal spur, right foot: Secondary | ICD-10-CM | POA: Diagnosis not present

## 2023-09-22 DIAGNOSIS — Z79899 Other long term (current) drug therapy: Secondary | ICD-10-CM | POA: Diagnosis not present

## 2023-09-22 DIAGNOSIS — I2510 Atherosclerotic heart disease of native coronary artery without angina pectoris: Secondary | ICD-10-CM | POA: Diagnosis not present

## 2023-09-22 DIAGNOSIS — S9031XA Contusion of right foot, initial encounter: Secondary | ICD-10-CM | POA: Diagnosis not present

## 2023-09-22 DIAGNOSIS — Z87891 Personal history of nicotine dependence: Secondary | ICD-10-CM | POA: Diagnosis not present

## 2023-09-22 DIAGNOSIS — S62614A Displaced fracture of proximal phalanx of right ring finger, initial encounter for closed fracture: Secondary | ICD-10-CM | POA: Diagnosis not present

## 2023-09-22 DIAGNOSIS — M1990 Unspecified osteoarthritis, unspecified site: Secondary | ICD-10-CM | POA: Diagnosis not present

## 2023-09-23 DIAGNOSIS — M79671 Pain in right foot: Secondary | ICD-10-CM | POA: Diagnosis not present

## 2023-10-03 DIAGNOSIS — M79671 Pain in right foot: Secondary | ICD-10-CM | POA: Diagnosis not present

## 2023-10-10 DIAGNOSIS — M19071 Primary osteoarthritis, right ankle and foot: Secondary | ICD-10-CM | POA: Diagnosis not present

## 2023-10-10 DIAGNOSIS — M79671 Pain in right foot: Secondary | ICD-10-CM | POA: Diagnosis not present

## 2023-10-18 ENCOUNTER — Other Ambulatory Visit: Payer: Self-pay | Admitting: Cardiology

## 2023-10-24 DIAGNOSIS — M79671 Pain in right foot: Secondary | ICD-10-CM | POA: Diagnosis not present

## 2023-10-31 ENCOUNTER — Other Ambulatory Visit: Payer: Self-pay | Admitting: Cardiology

## 2023-10-31 DIAGNOSIS — I10 Essential (primary) hypertension: Secondary | ICD-10-CM

## 2023-11-02 IMAGING — MG DIGITAL SCREENING BREAST BILAT IMPLANT W/ TOMO W/ CAD
8 of 14 series · 8 of 34 positions shown · non-contrast
Comparison: Previous exam(s).

CLINICAL DATA: Screening.

EXAM:
DIGITAL SCREENING BILATERAL MAMMOGRAM WITH IMPLANTS, CAD AND
TOMOSYNTHESIS
TECHNIQUE: Bilateral screening digital craniocaudal and mediolateral oblique
mammograms were obtained. Bilateral screening digital breast
tomosynthesis was performed. The images were evaluated with
computer-aided detection. Standard and/or implant displaced views
were performed.

[L CC]
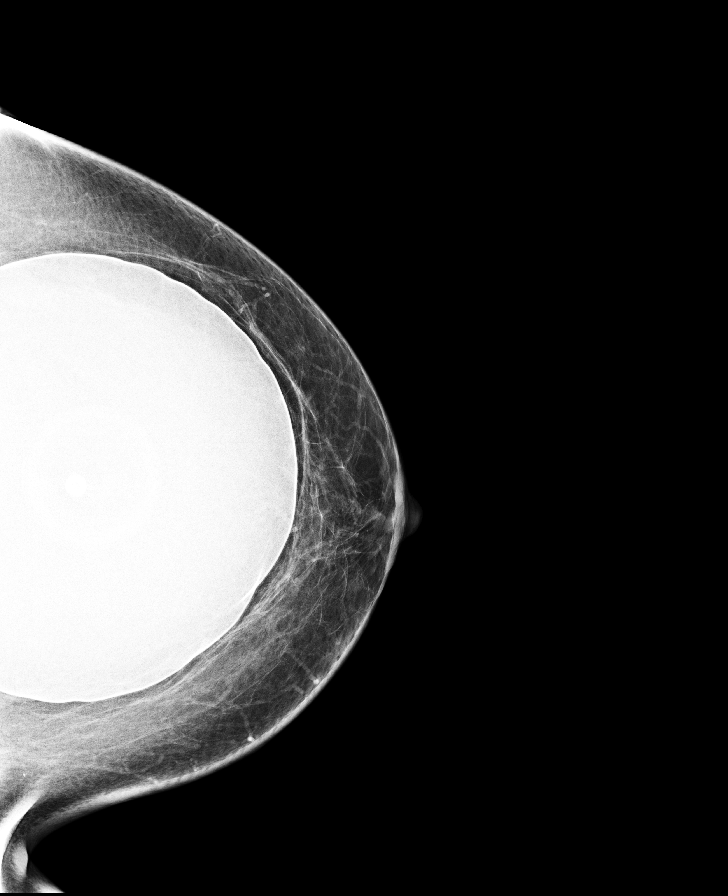

[L MLO]
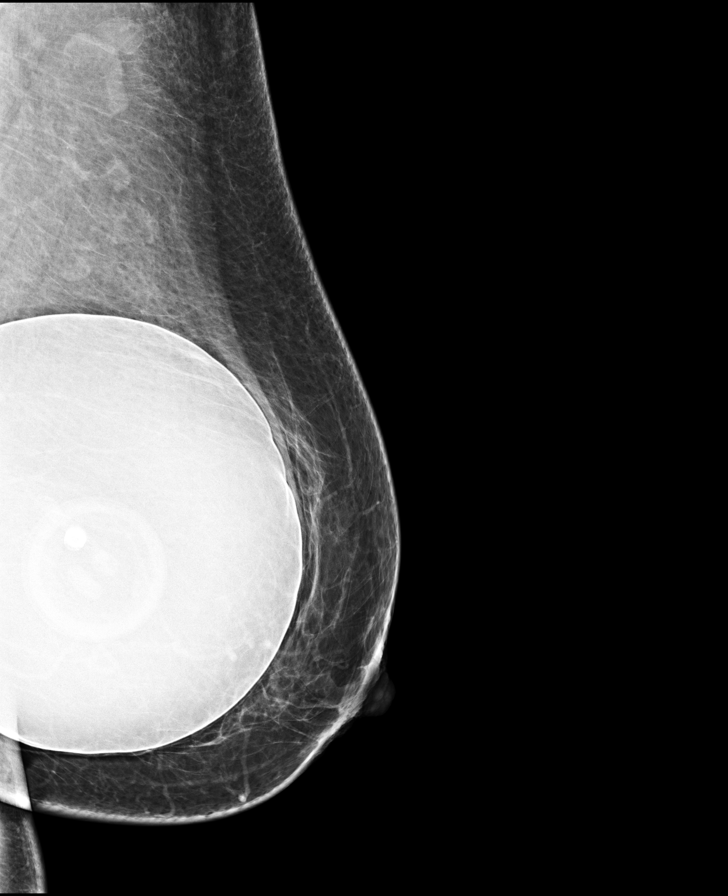

[R MLO]
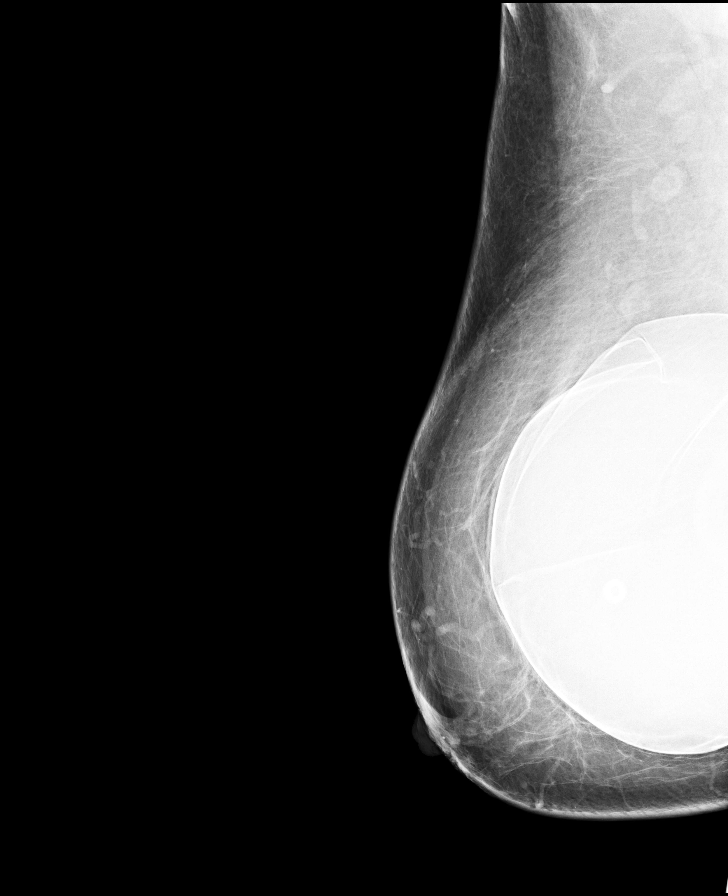

[R CC]
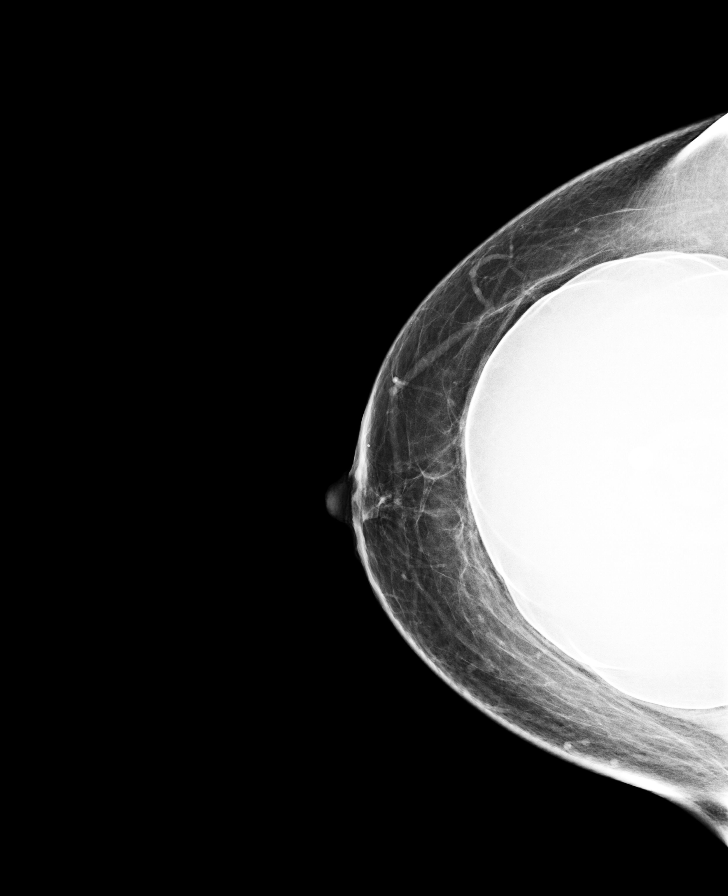

[R MLO synth-2D]
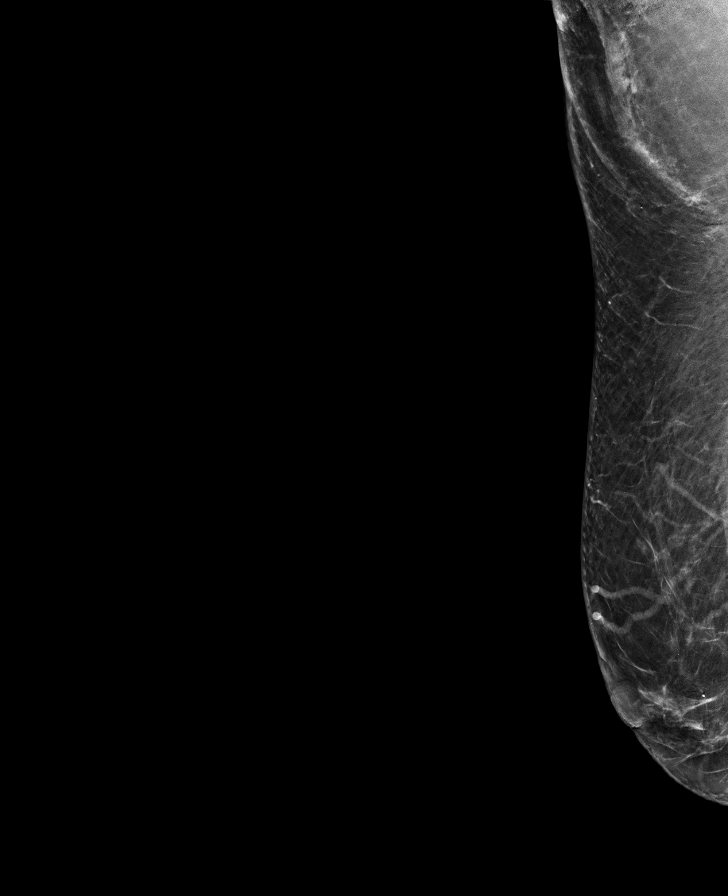

[L MLO synth-2D]
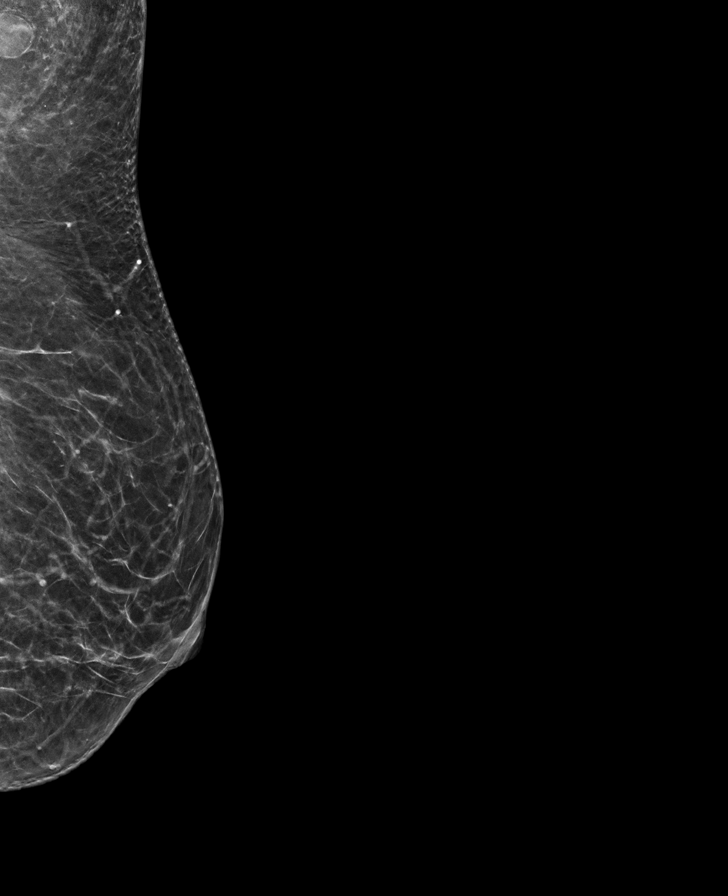

[R CC synth-2D]
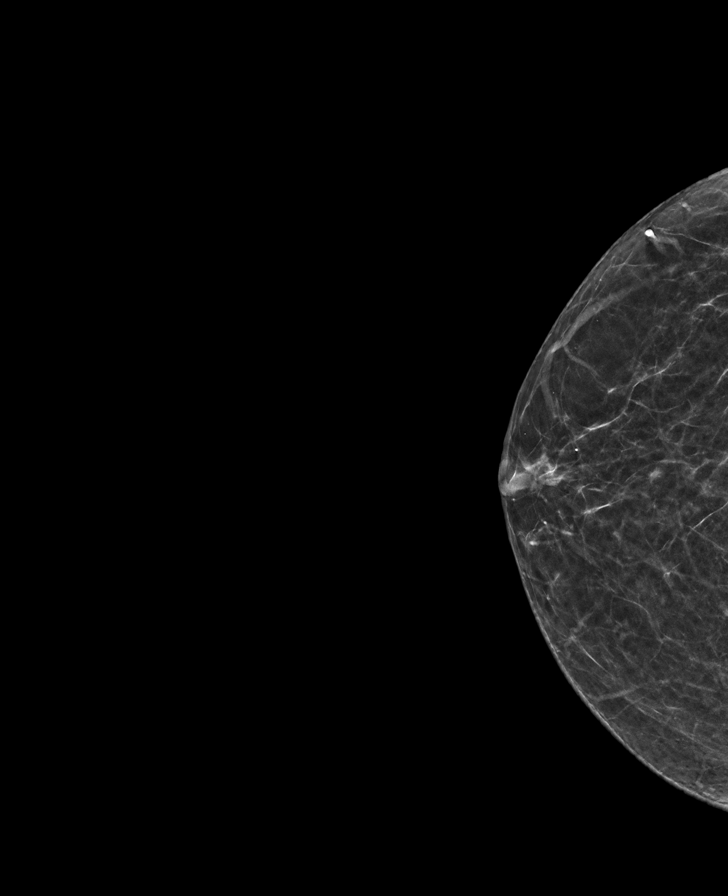

[L CC synth-2D]
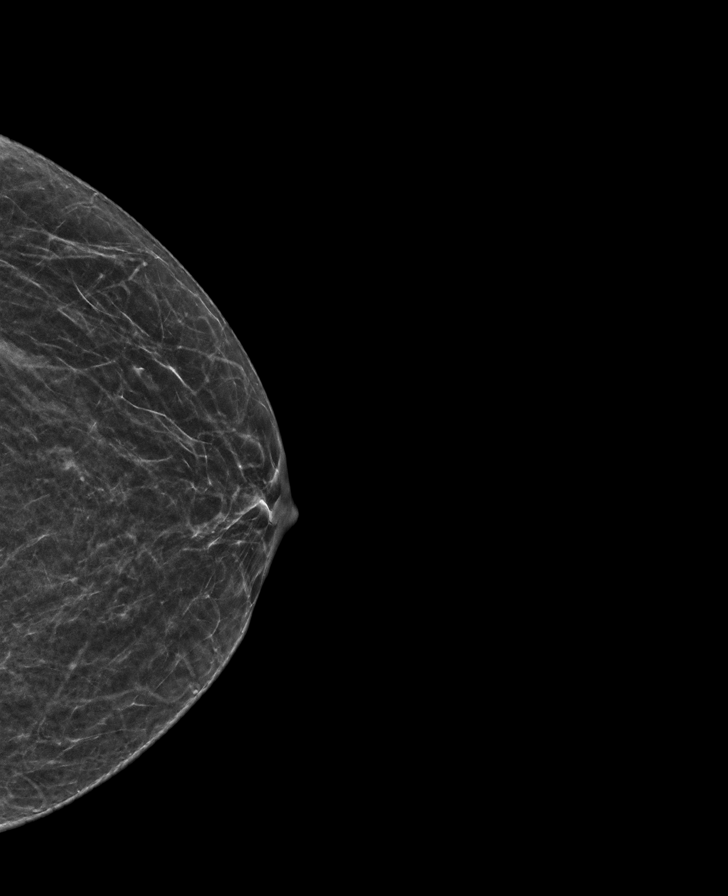

[8 of 34 positions shown; findings below may reference images not displayed]

ACR Breast Density Category b: There are scattered areas of
fibroglandular density.
FINDINGS: The patient has retropectoral implants. There are no findings
suspicious for malignancy.
IMPRESSION: No mammographic evidence of malignancy. A result letter of this
screening mammogram will be mailed directly to the patient.

RECOMMENDATION:
Screening mammogram in one year. (Code:SE-S-JMG)

BI-RADS CATEGORY  1:  Negative.

## 2023-11-05 ENCOUNTER — Telehealth: Payer: Self-pay | Admitting: Family Medicine

## 2023-11-05 NOTE — Telephone Encounter (Signed)
Call patient: Needs a follow-up diabetes appointment.

## 2023-11-14 ENCOUNTER — Other Ambulatory Visit: Payer: Self-pay | Admitting: Cardiology

## 2023-11-21 ENCOUNTER — Other Ambulatory Visit: Payer: Self-pay | Admitting: Family Medicine

## 2023-11-24 ENCOUNTER — Ambulatory Visit (INDEPENDENT_AMBULATORY_CARE_PROVIDER_SITE_OTHER): Payer: Federal, State, Local not specified - PPO

## 2023-11-24 VITALS — Ht 66.0 in | Wt 194.0 lb

## 2023-11-24 DIAGNOSIS — Z0000 Encounter for general adult medical examination without abnormal findings: Secondary | ICD-10-CM | POA: Diagnosis not present

## 2023-11-24 NOTE — Patient Instructions (Signed)
  [name] , Thank you for taking time to come for your Medicare Wellness Visit. I appreciate your ongoing commitment to your health goals. Please review the following plan we discussed and let me know if I can assist you in the future.   These are the goals we discussed:  Goals       Activity and Exercise Increased      She would like to be more active after her feet are better.       Medication Management      Patient Goals/Self-Care Activities Over the next 90 days, patient will:  take medications as prescribed and check blood pressure 2x per week, document, and provide at future appointments  Follow Up Plan: Telephone follow up appointment with care management team member scheduled for:  4-5 months      Patient Stated (pt-stated)      08/20/2021 AWV Goal: Exercise for General Health  Patient will verbalize understanding of the benefits of increased physical activity: Exercising regularly is important. It will improve your overall fitness, flexibility, and endurance. Regular exercise also will improve your overall health. It can help you control your weight, reduce stress, and improve your bone density. Over the next year, patient will increase physical activity as tolerated with a goal of at least 150 minutes of moderate physical activity per week.  You can tell that you are exercising at a moderate intensity if your heart starts beating faster and you start breathing faster but can still hold a conversation. Moderate-intensity exercise ideas include: Walking 1 mile (1.6 km) in about 15 minutes Biking Hiking Golfing Dancing Water aerobics Patient will verbalize understanding of everyday activities that increase physical activity by providing examples like the following: Yard work, such as: Insurance underwriter Gardening Washing windows or floors Patient will be able to explain general safety  guidelines for exercising:  Before you start a new exercise program, talk with your health care provider. Do not exercise so much that you hurt yourself, feel dizzy, or get very short of breath. Wear comfortable clothes and wear shoes with good support. Drink plenty of water while you exercise to prevent dehydration or heat stroke. Work out until your breathing and your heartbeat get faster.       Patient Stated (pt-stated)      Patient stated that she would like to be able to walk without being in pain.        This is a list of the screening recommended for you and due dates:  Health Maintenance  Topic Date Due   Pneumonia Vaccine (1 of 2 - PCV) Never done   Complete foot exam   Never done   Eye exam for diabetics  Never done   Zoster (Shingles) Vaccine (1 of 2) Never done   Yearly kidney health urinalysis for diabetes  12/13/2009   Hemoglobin A1C  10/15/2017   Flu Shot  05/08/2023   COVID-19 Vaccine (3 - 2024-25 season) 06/08/2023   Yearly kidney function blood test for diabetes  11/01/2023   Medicare Annual Wellness Visit  11/23/2024   DTaP/Tdap/Td vaccine (2 - Td or Tdap) 12/20/2024   Mammogram  12/24/2024   Colon Cancer Screening  07/25/2029   DEXA scan (bone density measurement)  Completed   Hepatitis C Screening  Completed   HPV Vaccine  Aged Out

## 2023-11-24 NOTE — Progress Notes (Addendum)
Subjective:   [name] is a 68 y.o. female who presents for Medicare Annual (Subsequent) preventive examination.  Visit Complete: Virtual I connected with  [name] on 11/24/23 by a audio enabled telemedicine application and verified that I am speaking with the correct person using two identifiers.   Patient Location: Home  Provider Location: Office/Clinic  I discussed the limitations of evaluation and management by telemedicine. The patient expressed understanding and agreed to proceed.  Vital Signs: Because this visit was a virtual/telehealth visit, some criteria may be missing or patient reported. Any vitals not documented were not able to be obtained and vitals that have been documented are patient reported.  Patient Medicare AWV questionnaire was completed by the patient on n/a; I have confirmed that all information answered by patient is correct and no changes since this date.  Cardiac Risk Factors include: advanced age (>73men, >56 women);hypertension;obesity (BMI >30kg/m2);smoking/ tobacco exposure     Objective:    Today's Vitals   11/24/23 0809  Weight: 194 lb (88 kg)  Height: 5\' 6"  (1.676 m)   Body mass index is 31.31 kg/m.     11/24/2023    8:27 AM 11/18/2022    8:14 AM 05/09/2022    1:19 PM 08/20/2021    1:59 PM 02/10/2018    3:30 PM 08/20/2016   10:29 AM 12/21/2014   12:27 PM  Advanced Directives  Does Patient Have a Medical Advance Directive? No Yes Yes Yes Yes Yes No  Type of Advance Directive  Living will Healthcare Power of [place];Living will Healthcare Power of [place];Living will Healthcare Power of [place];Living will Healthcare Power of [place];Living will   Does patient want to make changes to medical advance directive?  No - Patient declined No - Patient declined No - Patient declined  No - Patient declined   Copy of Healthcare Power of Attorney in Chart?    No - copy requested No - copy requested    Would patient like information on  creating a medical advance directive? No - Patient declined      Yes - Educational materials given    Current Medications (verified) Outpatient Encounter Medications as of 11/24/2023  Medication Sig   AMBULATORY NON FORMULARY MEDICATION Walker. Use as needed. Disp 1.  Unable gait R26.81   amitriptyline (ELAVIL) 10 MG tablet Take 2 tablets by mouth every evening   amLODipine-olmesartan (AZOR) 10-40 MG tablet Take 1 tablet by mouth daily   ASPIRIN LOW DOSE 81 MG tablet Take 1 tablet by mouth every day   atorvastatin (LIPITOR) 80 MG tablet Take 1 tablet by mouth daily   busPIRone (BUSPAR) 10 MG tablet Take 1 tablet by mouth 3 times a day   cloNIDine (CATAPRES - DOSED IN MG/24 HR) 0.1 mg/24hr patch Place 1 patch on the skin once a week   cloNIDine (CATAPRES - DOSED IN MG/24 HR) 0.3 mg/24hr patch Place 1 patch onto the skin once a week   clopidogrel (PLAVIX) 75 MG tablet Take 1 tablet by mouth daily   diclofenac Sodium (VOLTAREN) 1 % GEL Apply 4 grams to the skin four times a day   DULoxetine (CYMBALTA) 60 MG capsule Take 1 capsule by mouth every evening   esomeprazole (NEXIUM) 40 MG capsule Take 40 mg by mouth daily.   finasteride (PROPECIA) 1 MG tablet Take by mouth.   furosemide (LASIX) 40 MG tablet Take 1 tablet by mouth twice daily   gabapentin (NEURONTIN) 800 MG tablet Take 1 tablet by  mouth 3 times a day   hydrALAZINE (APRESOLINE) 100 MG tablet Take 1 tablet by mouth 3 times daily   ibuprofen (ADVIL) 800 MG tablet Take 1 tablet (800 mg total) by mouth every 6 (six) hours as needed.   labetalol (NORMODYNE) 300 MG tablet Take 2 tablets by mouth twice daily   lidocaine (XYLOCAINE) 5 % ointment Apply 1 Application topically as needed.   MINOXIDIL, TOPICAL, 5 % SOLN Apply 1 application topically daily.   Multiple Vitamins-Minerals (MULTIVITAMINS THER. W/MINERALS) TABS Take 1 tablet by mouth daily.   nitroGLYCERIN (NITRODUR - DOSED IN MG/24 HR) 0.2 mg/hr patch Apply 1/4 patch daily to tendon  for tendonitis   salicylic acid 17 % gel Apply topically daily.   spironolactone (ALDACTONE) 50 MG tablet Take 1 tablet by mouth daily   traZODone (DESYREL) 50 MG tablet Take 50 mg by mouth at bedtime.   urea (CARMOL) 40 % CREA Apply 1 Application topically 2 (two) times daily.   VENTOLIN HFA 108 (90 BASE) MCG/ACT inhaler prn   vitamin B-12 (CYANOCOBALAMIN) 500 MCG tablet Take by mouth.   [DISCONTINUED] methylPREDNISolone (MEDROL DOSEPAK) 4 MG TBPK tablet Take as directed   [DISCONTINUED] oxyCODONE-acetaminophen (PERCOCET) 5-325 MG tablet Take 1 tablet by mouth every 4 (four) hours as needed for severe pain.   No facility-administered encounter medications on file as of 11/24/2023.    Allergies (verified) Codeine   History: Past Medical History:  Diagnosis Date   Allergy    Anemia    Anxiety    Bipolar disorder (HCC)    CHF (congestive heart failure) (HCC)    Chronic diastolic heart failure (HCC)    hospitalized at [facility]   Closed nondisplaced fracture of phalanx of toe of left foot 12/07/2020   Complication of anesthesia    had "breathing problems and ended up in ICU" 03/2010 report on chart   COPD (chronic obstructive pulmonary disease) (HCC)    DDD (degenerative disc disease), lumbar    Depression    Diabetes in pregnancy    diet cnontrolled   GERD (gastroesophageal reflux disease)    Glucose intolerance (impaired glucose tolerance)    HLD (hyperlipidemia)    nec/nos   HTN (hypertension)    benign essential   MVP (mitral valve prolapse)    Narcolepsy    Stroke (HCC)    Suicide attempt (HCC)    hx   Syncope    admx to [facility] in 2/12: Echo with normal LVF; head CT unremarkable, ECG stable, no further w/u   Ulcer    Past Surgical History:  Procedure Laterality Date   AUGMENTATION MAMMAPLASTY  2003   BLADDER SUSPENSION  2002   DENTAL SURGERY  2016   Teeth implants x 4   GASTRIC BYPASS  2014   at [facility]   GASTRIC FUNDOPLICATION     LUMBAR  LAMINECTOMY/DECOMPRESSION MICRODISCECTOMY  09/11/2011   Procedure: LUMBAR LAMINECTOMY/DECOMPRESSION MICRODISCECTOMY;  Surgeon: Jacki Cones;  Location: [facility];  Service: Orthopedics;  Laterality: Right;  Hemi Laminectomy/Microdiscectomy L5 - S1 on the Right (X-Ray)   PARTIAL HYSTERECTOMY  1979   REPAIR PERONEAL TENDONS ANKLE Left 2008   after injury    Family History  Problem Relation Age of Onset   Diabetes Mother    Hypertension Mother    Heart attack Father 64   Hypertension Father    Stroke Other        grandmother   Colon cancer Neg Hx  Esophageal cancer Neg Hx    Stomach cancer Neg Hx    Rectal cancer Neg Hx    Social History   Socioeconomic History   Marital status: Married    Spouse name: [name]   Number of children: 2   Years of education: 13   Highest education level: Some college, no degree  Occupational History   Occupation: legally disabled  Tobacco Use   Smoking status: Former    Current packs/day: 0.00    Average packs/day: 0.5 packs/day for 15.0 years (7.5 ttl pk-yrs)    Types: Cigarettes    Start date: 10/07/1988    Quit date: 10/08/2003    Years since quitting: 20.1   Smokeless tobacco: Never  Vaping Use   Vaping status: Never Used  Substance and Sexual Activity   Alcohol use: Yes    Comment: rare   Drug use: No   Sexual activity: Not on file  Other Topics Concern   Not on file  Social History Narrative   Lives with her spouse and her son. She has two children. She enjoys watching television. She is not able to do a lot of since her stroke in January, 2022.   Social Drivers of Corporate investment banker Strain: Low Risk  (11/24/2023)   Overall Financial Resource Strain (CARDIA)    Difficulty of Paying Living Expenses: Not very hard  Food Insecurity: No Food Insecurity (11/24/2023)   Hunger Vital Sign    Worried About Running Out of Food in the Last Year: Never true    Ran Out of Food in the Last Year: Never true  Transportation  Needs: No Transportation Needs (11/24/2023)   PRAPARE - Administrator, Civil Service (Medical): No    Lack of Transportation (Non-Medical): No  Physical Activity: Inactive (11/24/2023)   Exercise Vital Sign    Days of Exercise per Week: 0 days    Minutes of Exercise per Session: 0 min  Stress: No Stress Concern Present (11/24/2023)   [name] of Occupational Health - Occupational Stress Questionnaire    Feeling of Stress : Only a little  Social Connections: Socially Integrated (11/24/2023)   Social Connection and Isolation Panel [NHANES]    Frequency of Communication with Friends and Family: More than three times a week    Frequency of Social Gatherings with Friends and Family: More than three times a week    Attends Religious Services: More than 4 times per year    Active Member of [employer] or Organizations: Yes    Attends Engineer, structural: More than 4 times per year    Marital Status: Married    Tobacco Counseling Counseling given: Not Answered   Clinical Intake:  Pre-visit preparation completed: Yes  Pain : No/denies pain     BMI - recorded: 31.31 Nutritional Status: BMI > 30  Obese Nutritional Risks: None Diabetes: No  How often do you need to have someone help you when you read instructions, pamphlets, or other written materials from your doctor or pharmacy?: 1 - Never What is the last grade level you completed in school?: 14  Interpreter Needed?: No      Activities of Daily Living    11/24/2023    8:12 AM  In your present state of health, do you have any difficulty performing the following activities:  Hearing? 0  Vision? 1  Comment Since stroke vision not as clear  Difficulty concentrating or making decisions? 0  Walking or climbing stairs?  1  Comment uses a cane  Dressing or bathing? 0  Doing errands, shopping? 1  Comment Disable. She has trouble going shopping.  Preparing Food and eating ? N  Using the Toilet? N   Managing your Medications? N  Managing your Finances? N  Housekeeping or managing your Housekeeping? Y  Comment More since stroke.    Patient Care Team: Agapito Games, [name] as PCP - General Jens Som, Madolyn Frieze, [name] as PCP - Cardiology (Cardiology) Jens Som Madolyn Frieze, [name] as Consulting Physician (Cardiology) Jens Som Madolyn Frieze, [name] as Consulting Physician (Cardiology)  Indicate any recent Medical Services you may have received from other than [facility] providers in the past year (date may be approximate).     Assessment:   This is a routine wellness examination for [name].  Hearing/Vision screen Hearing Screening - Comments:: Unable to check Vision Screening - Comments:: Unable to check   Goals Addressed             This Visit's Progress    Activity and Exercise Increased       She would like to be more active after her feet are better.        Depression Screen    11/24/2023    8:26 AM 11/18/2022    8:14 AM 09/10/2022    2:28 PM 12/28/2021    3:14 PM 08/20/2021    2:01 PM 02/09/2019    1:39 PM 03/10/2018    3:48 PM  PHQ 2/9 Scores  PHQ - 2 Score 0 0 0 4 0 2 2  PHQ- 9 Score    9 0 10 8    Fall Risk    11/24/2023    8:28 AM 11/18/2022    8:13 AM 09/10/2022    2:27 PM 08/20/2021    2:01 PM 06/08/2019    6:48 AM  Fall Risk   Falls in the past year? 1 0 0 1   Number falls in past yr: 1 0 0 1   Injury with Fall? 1 0 0 1   Risk for fall due to : Impaired mobility Impaired mobility No Fall Risks Impaired balance/gait;Impaired mobility;History of fall(s) Impaired balance/gait;Impaired mobility  Follow up Falls evaluation completed Falls evaluation completed;Education provided;Falls prevention discussed Falls evaluation completed Education provided;Falls prevention discussed;Falls evaluation completed Education provided    MEDICARE RISK AT HOME: Medicare Risk at Home Any stairs in or around the home?: Yes If so, are there any without handrails?: Yes Home free of loose throw  rugs in walkways, pet beds, electrical cords, etc?: No Adequate lighting in your home to reduce risk of falls?: Yes Life alert?: Yes Use of a cane, walker or w/c?: Yes Grab bars in the bathroom?: No Shower chair or bench in shower?: Yes Elevated toilet seat or a handicapped toilet?: Yes  TIMED UP AND GO:  Was the test performed?  No    Cognitive Function:        11/24/2023    8:30 AM 11/18/2022    8:18 AM 08/20/2021    2:17 PM  6CIT Screen  What Year? 0 points 0 points 0 points  What month? 0 points 0 points 0 points  What time? 0 points 0 points 0 points  Count back from 20 0 points 0 points 2 points  Months in reverse 0 points 0 points 0 points  Repeat phrase 0 points 0 points 0 points  Total Score 0 points 0 points 2 points    Immunizations Immunization History  Administered  Date(s) Administered   Influenza Split 06/27/2011   Influenza Whole 07/15/2007   Influenza,inj,Quad PF,6+ Mos 07/31/2016   Janssen (J&J) SARS-COV-2 Vaccination 02/04/2020   PFIZER(Purple Top)SARS-COV-2 Vaccination 11/07/2020   Tdap 12/21/2014    TDAP status: Up to date  Flu Vaccine status: Due, Education has been provided regarding the importance of this vaccine. Advised may receive this vaccine at local pharmacy or Health Dept. Aware to provide a copy of the vaccination record if obtained from local pharmacy or Health Dept. Verbalized acceptance and understanding.  Pneumococcal vaccine status: Due, Education has been provided regarding the importance of this vaccine. Advised may receive this vaccine at local pharmacy or Health Dept. Aware to provide a copy of the vaccination record if obtained from local pharmacy or Health Dept. Verbalized acceptance and understanding.  Covid-19 vaccine status: Completed vaccines  Qualifies for Shingles Vaccine? Yes   Zostavax completed No   Shingrix Completed?: No.    Education has been provided regarding the importance of this vaccine. Patient has been  advised to call insurance company to determine out of pocket expense if they have not yet received this vaccine. Advised may also receive vaccine at local pharmacy or Health Dept. Verbalized acceptance and understanding.  Screening Tests Health Maintenance  Topic Date Due   Pneumonia Vaccine 53+ Years old (1 of 2 - PCV) Never done   FOOT EXAM  Never done   OPHTHALMOLOGY EXAM  Never done   Zoster Vaccines- Shingrix (1 of 2) Never done   Diabetic kidney evaluation - Urine ACR  12/13/2009   HEMOGLOBIN A1C  10/15/2017   INFLUENZA VACCINE  05/08/2023   COVID-19 Vaccine (3 - 2024-25 season) 06/08/2023   Diabetic kidney evaluation - eGFR measurement  11/01/2023   Medicare Annual Wellness (AWV)  12/25/2023   DTaP/Tdap/Td (2 - Td or Tdap) 12/20/2024   MAMMOGRAM  12/24/2024   Colonoscopy  07/25/2029   DEXA SCAN  Completed   Hepatitis C Screening  Completed   HPV VACCINES  Aged Out    Health Maintenance  Health Maintenance Due  Topic Date Due   Pneumonia Vaccine 71+ Years old (1 of 2 - PCV) Never done   FOOT EXAM  Never done   OPHTHALMOLOGY EXAM  Never done   Zoster Vaccines- Shingrix (1 of 2) Never done   Diabetic kidney evaluation - Urine ACR  12/13/2009   HEMOGLOBIN A1C  10/15/2017   INFLUENZA VACCINE  05/08/2023   COVID-19 Vaccine (3 - 2024-25 season) 06/08/2023   Diabetic kidney evaluation - eGFR measurement  11/01/2023   Medicare Annual Wellness (AWV)  12/25/2023    Colorectal cancer screening: Type of screening: Colonoscopy. Completed 07/26/2019. Repeat every 10 years  Mammogram status: Completed 12/25/2022. Repeat every year  Bone Density status: Completed 12/21/2022. Results reflect: Bone density results: OSTEOPENIA. Repeat every 2 years.  Lung Cancer Screening: (Low Dose CT Chest recommended if Age 107-80 years, 20 pack-year currently smoking OR have quit w/in 15years.) does not qualify.   Lung Cancer Screening Referral: n/a  Additional Screening:  Hepatitis C  Screening: does qualify; Completed 12/21/2014  Vision Screening: Recommended annual ophthalmology exams for early detection of glaucoma and other disorders of the eye. Is the patient up to date with their annual eye exam?  No  Who is the provider or what is the name of the office in which the patient attends annual eye exams? [facility] If pt is not established with a provider, would they like to be referred to a provider to  establish care? No .   Dental Screening: Recommended annual dental exams for proper oral hygiene    Community Resource Referral / Chronic Care Management: CRR required this visit?  No   CCM required this visit?  No     Plan:     I have personally reviewed and noted the following in the patient's chart:   Medical and social history Use of alcohol, tobacco or illicit drugs  Current medications and supplements including opioid prescriptions. Patient is not currently taking opioid prescriptions. Functional ability and status Nutritional status Physical activity Advanced directives List of other physicians Hospitalizations, surgeries, and ER visits in previous 12 months Vitals Screenings to include cognitive, depression, and falls Referrals and appointments  In addition, I have reviewed and discussed with patient certain preventive protocols, quality metrics, and best practice recommendations. A written personalized care plan for preventive services as well as general preventive health recommendations were provided to patient.     Esmond Harps, CMA   11/24/2023   After Visit Summary: (MyChart) Due to this being a telephonic visit, the after visit summary with patients personalized plan was offered to patient via MyChart   Nurse Notes:   [name] is a 68 y.o. female patient of [name], Theresa Ehlers, [name] who had a Medicare Annual Wellness Visit today via telephone. [name] is Legally disabled and lives with their spouse and her son. she has 2  children. she reports that she is socially active and does interact with friends/family regularly. she is minimally physically active and enjoys watching television.   She will be due for Mammogram next month. She will call to schedule.   She is also due for an eye exam. She will call to schedule.   Kamyiah scheduled for a follow up with Dr Linford Arnold in the next couple of weeks.

## 2023-12-05 ENCOUNTER — Other Ambulatory Visit: Payer: Self-pay

## 2023-12-05 DIAGNOSIS — I10 Essential (primary) hypertension: Secondary | ICD-10-CM

## 2023-12-05 MED ORDER — CLOPIDOGREL BISULFATE 75 MG PO TABS: 75.0000 mg | ORAL_TABLET | Freq: Every day | ORAL | 0 refills | Status: DC

## 2023-12-05 MED ORDER — AMLODIPINE-OLMESARTAN 10-40 MG PO TABS: 1.0000 | ORAL_TABLET | Freq: Every day | ORAL | 0 refills | Status: DC

## 2023-12-05 MED ORDER — ASPIRIN 81 MG PO TBEC: 81.0000 mg | DELAYED_RELEASE_TABLET | Freq: Every day | ORAL | 0 refills | Status: DC

## 2023-12-05 MED ORDER — HYDRALAZINE HCL 100 MG PO TABS: 100.0000 mg | ORAL_TABLET | Freq: Three times a day (TID) | ORAL | 0 refills | Status: DC

## 2023-12-05 MED ORDER — NITROGLYCERIN 0.2 MG/HR TD PT24: MEDICATED_PATCH | TRANSDERMAL | 0 refills | Status: DC

## 2023-12-05 MED ORDER — SPIRONOLACTONE 50 MG PO TABS: 50.0000 mg | ORAL_TABLET | Freq: Every day | ORAL | 0 refills | Status: DC

## 2023-12-05 MED ORDER — ATORVASTATIN CALCIUM 80 MG PO TABS: 80.0000 mg | ORAL_TABLET | Freq: Every day | ORAL | 0 refills | Status: DC

## 2023-12-05 MED ORDER — FUROSEMIDE 40 MG PO TABS: 40.0000 mg | ORAL_TABLET | Freq: Two times a day (BID) | ORAL | 0 refills | Status: DC

## 2023-12-21 ENCOUNTER — Other Ambulatory Visit: Payer: Self-pay | Admitting: Family Medicine

## 2023-12-22 ENCOUNTER — Other Ambulatory Visit: Payer: Self-pay | Admitting: Cardiology

## 2023-12-23 ENCOUNTER — Ambulatory Visit (INDEPENDENT_AMBULATORY_CARE_PROVIDER_SITE_OTHER): Payer: Medicare Other | Admitting: Family Medicine

## 2023-12-23 VITALS — BP 96/60 | HR 78 | Ht 66.0 in | Wt 195.0 lb

## 2023-12-23 DIAGNOSIS — E119 Type 2 diabetes mellitus without complications: Secondary | ICD-10-CM | POA: Diagnosis not present

## 2023-12-23 DIAGNOSIS — I509 Heart failure, unspecified: Secondary | ICD-10-CM

## 2023-12-23 DIAGNOSIS — M79672 Pain in left foot: Secondary | ICD-10-CM

## 2023-12-23 DIAGNOSIS — I25811 Atherosclerosis of native coronary artery of transplanted heart without angina pectoris: Secondary | ICD-10-CM

## 2023-12-23 DIAGNOSIS — I10 Essential (primary) hypertension: Secondary | ICD-10-CM

## 2023-12-23 DIAGNOSIS — R7309 Other abnormal glucose: Secondary | ICD-10-CM

## 2023-12-23 DIAGNOSIS — Z01419 Encounter for gynecological examination (general) (routine) without abnormal findings: Secondary | ICD-10-CM

## 2023-12-23 LAB — POCT GLYCOSYLATED HEMOGLOBIN (HGB A1C): Hemoglobin A1C: 5.8 % — AB (ref 4.0–5.6)

## 2023-12-23 NOTE — Progress Notes (Signed)
 Established Patient Office Visit  Subjective  Patient ID: [name], female    DOB: Apr 10, 1956  Age: 68 y.o. MRN: [id number]  Chief Complaint  Patient presents with   Hypertension   Hyperlipidemia    HPI  She said she had surgery on her left foot in January with Dr. Nicholes Rough with podiatry.  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  But she said she did end up having surgery in January.  She said that he noted that he had to go almost down to the bone to remove it and that when she came out of surgery the incision was a lot larger than she thought it was going to be.  She says ever since then it has been painful and tender its even hard to walk at times.  She says she has never had issues with poor wound healing or keloids.  She said she did not want to return to their office but would like it further evaluated.  She has not had any drainage from the wound.  There are additional stress lately.  Her uncle recently passed away and her burden of 20 years 27 also passed away.  Her husband's renal function is not doing well and sounds like he is probably in stage V kidney disease and is just being resistant to treatment.    ROS    Objective:     BP 96/60   Pulse 78   Ht 5\' 6"  (1.676 m)   Wt 195 lb (88.5 kg)   SpO2 96%   BMI 31.47 kg/m    Physical Exam     Results for orders placed or performed in visit on 12/23/23  POCT HgB A1C  Result Value Ref Range   Hemoglobin A1C 5.8 (A) 4.0 - 5.6 %   HbA1c POC (<> result, manual entry)     HbA1c, POC (prediabetic range)     HbA1c, POC (controlled diabetic range)        The ASCVD Risk score (Arnett DK, et al., 2019) failed to calculate for the following reasons:   Risk score cannot be calculated because patient has a medical history suggesting prior/existing ASCVD    Assessment & Plan:   Problem List Items Addressed  This Visit       Cardiovascular and Mediastinum   Hypertension   History of labile blood pressure.  Today blood pressure is on the lower end but she actually feels fine no lightheadedness or dizziness chest pain or shortness of breath or palpitations.  Continue current regimen.      Essential hypertension, malignant   Relevant Orders   POCT HgB A1C (Completed)   Lipid Panel With LDL/HDL Ratio   CMP14+EGFR   Urine Microalbumin w/creat. ratio   CBC   Coronary atherosclerosis   Relevant Orders   POCT HgB A1C (Completed)   Lipid Panel With LDL/HDL Ratio   CMP14+EGFR   Urine Microalbumin w/creat. ratio   CBC   Congestive heart failure (HCC)   Sign of volume overload on exam today she is doing well.        Endocrine   Type II diabetes mellitus (HCC)   1C looks great today at 5.8 she is currently diet controlled continue to monitor carefully plan to recheck again in 6 months.      Other Visit Diagnoses       Abnormal  glucose    -  Primary     Diabetes mellitus without complication (HCC)       Relevant Orders   POCT HgB A1C (Completed)   Lipid Panel With LDL/HDL Ratio   CMP14+EGFR   Urine Microalbumin w/creat. ratio   CBC     Gynecologic exam normal       Relevant Orders   Ambulatory referral to Obstetrics / Gynecology     Left foot pain       Relevant Orders   MR FOOT LEFT WO CONTRAST       Abnormal wound healing after surgery-Patient had surgery on that foot for capsulitis a couple of months ago and where the prior incision is there is an area of fluctuance that needs further evaluation to see if maybe it has created a tunneling cyst or possible infection.  Area is visibly raised but is fluctuant upon palpation.  No surrounding erythema no open drainage.  It still causing a lot of pain and tenderness.  Return in about 6 months (around 06/24/2024) for Diabetes follow-up.    Nani Gasser, MD

## 2023-12-23 NOTE — Assessment & Plan Note (Signed)
 1C looks great today at 5.8 she is currently diet controlled continue to monitor carefully plan to recheck again in 6 months.

## 2023-12-23 NOTE — Assessment & Plan Note (Signed)
 Sign of volume overload on exam today she is doing well.

## 2023-12-23 NOTE — Assessment & Plan Note (Signed)
 History of labile blood pressure.  Today blood pressure is on the lower end but she actually feels fine no lightheadedness or dizziness chest pain or shortness of breath or palpitations.  Continue current regimen.

## 2023-12-24 ENCOUNTER — Encounter: Payer: Self-pay | Admitting: Family Medicine

## 2023-12-24 NOTE — Progress Notes (Signed)
 Hi [name], blood count looks okay.  Metabolic panel also looks okay your alkaline phosphatase which is a liver enzyme is up just slightly.  Not in a worrisome range but I do want to recheck that in a couple months just to make sure that it goes back down.  LDL cholesterol looks great.  And the urine sample is still pending.  Just also want encouraged to consider getting the newest pneumonia vaccine is recommended for those 65 and over you just get it once and then that is it.

## 2023-12-25 LAB — CMP14+EGFR
ALT: 13 IU/L (ref 0–32)
AST: 14 IU/L (ref 0–40)
Albumin: 4.5 g/dL (ref 3.9–4.9)
Alkaline Phosphatase: 143 IU/L — ABNORMAL HIGH (ref 44–121)
BUN/Creatinine Ratio: 12 (ref 12–28)
BUN: 11 mg/dL (ref 8–27)
Bilirubin Total: 1.2 mg/dL (ref 0.0–1.2)
CO2: 25 mmol/L (ref 20–29)
Calcium: 9.9 mg/dL (ref 8.7–10.3)
Chloride: 103 mmol/L (ref 96–106)
Creatinine, Ser: 0.95 mg/dL (ref 0.57–1.00)
Globulin, Total: 2.3 g/dL (ref 1.5–4.5)
Glucose: 104 mg/dL — ABNORMAL HIGH (ref 70–99)
Potassium: 4.6 mmol/L (ref 3.5–5.2)
Sodium: 141 mmol/L (ref 134–144)
Total Protein: 6.8 g/dL (ref 6.0–8.5)
eGFR: 66 mL/min/{1.73_m2} (ref >59)

## 2023-12-25 LAB — CBC
Hematocrit: 43.2 % (ref 34.0–46.6)
Hemoglobin: 14 g/dL (ref 11.1–15.9)
MCH: 28.5 pg (ref 26.6–33.0)
MCHC: 32.4 g/dL (ref 31.5–35.7)
MCV: 88 fL (ref 79–97)
Platelets: 249 10*3/uL (ref 150–450)
RBC: 4.92 x10E6/uL (ref 3.77–5.28)
RDW: 12.5 % (ref 11.7–15.4)
WBC: 11.3 10*3/uL — ABNORMAL HIGH (ref 3.4–10.8)

## 2023-12-25 LAB — MICROALBUMIN / CREATININE URINE RATIO
Creatinine, Urine: 197.8 mg/dL
Microalb/Creat Ratio: 19 mg/g{creat} (ref 0–29)
Microalbumin, Urine: 37.8 ug/mL

## 2023-12-25 LAB — LIPID PANEL WITH LDL/HDL RATIO
Cholesterol, Total: 115 mg/dL (ref 100–199)
HDL: 52 mg/dL (ref >39)
LDL Chol Calc (NIH): 50 mg/dL (ref 0–99)
LDL/HDL Ratio: 1 ratio (ref 0.0–3.2)
Triglycerides: 61 mg/dL (ref 0–149)
VLDL Cholesterol Cal: 13 mg/dL (ref 5–40)

## 2023-12-25 LAB — SPECIMEN STATUS REPORT

## 2023-12-25 NOTE — Progress Notes (Signed)
 HI [name], urine looks good. No excess protein.

## 2024-01-04 ENCOUNTER — Ambulatory Visit (INDEPENDENT_AMBULATORY_CARE_PROVIDER_SITE_OTHER)

## 2024-01-04 DIAGNOSIS — M79672 Pain in left foot: Secondary | ICD-10-CM | POA: Diagnosis not present

## 2024-01-09 ENCOUNTER — Telehealth: Payer: Self-pay | Admitting: Family Medicine

## 2024-01-09 NOTE — Telephone Encounter (Signed)
 Attempted to contact the patient. No answer. Left a vm msg informing patient the results are not available for the MRI. I've sent a STAT request to the imaging department to expedite the reading.

## 2024-01-09 NOTE — Telephone Encounter (Signed)
 Copied from CRM [id number]. Topic: Clinical - Lab/Test Results >> Jan 09, 2024  1:17 PM Carrielelia G wrote: Patient [name] calling for her results of Her MRI. Please advise

## 2024-01-12 ENCOUNTER — Encounter: Payer: Self-pay | Admitting: Obstetrics & Gynecology

## 2024-01-12 ENCOUNTER — Ambulatory Visit (INDEPENDENT_AMBULATORY_CARE_PROVIDER_SITE_OTHER): Admitting: Obstetrics & Gynecology

## 2024-01-12 VITALS — BP 156/94 | HR 71 | Wt 197.0 lb

## 2024-01-12 DIAGNOSIS — Z1231 Encounter for screening mammogram for malignant neoplasm of breast: Secondary | ICD-10-CM

## 2024-01-12 DIAGNOSIS — Z01419 Encounter for gynecological examination (general) (routine) without abnormal findings: Secondary | ICD-10-CM

## 2024-01-12 NOTE — Progress Notes (Signed)
 GYNECOLOGY ANNUAL PREVENTATIVE CARE ENCOUNTER NOTE  History:     [name] is a 68 y.o. PMP female s/p TAH in 1979  and complex medical history CHF, T2DM, HTN for benign reasons here for annual exam.  Was a patient of Dr. Tenny Craw at [facility].  Current complaints: increased hair growth under chin.   Denies abnormal vaginal bleeding, discharge, pelvic pain, problems with intercourse or other gynecologic concerns.    Gynecologic History No LMP recorded. Patient has had a hysterectomy. Last Mammogram: 12/25/2022.  Result was normal Last Colonoscopy: 07/26/2019.  Result was normal  Obstetric History OB History  No obstetric history on file.    Past Medical History:  Diagnosis Date   Allergy    Anemia    Anxiety    Bipolar disorder (HCC)    BIPOLAR DISORDER UNSPECIFIED 05/17/2009   Qualifier: Diagnosis of   By: Jens Som, MD, Lyn Hollingshead        Cerebral amyloid angiopathy (CODE) 12/07/2020   CHF (congestive heart failure) (HCC)    Chronic diastolic heart failure (HCC)    hospitalized at [facility] regional   Closed nondisplaced fracture of phalanx of toe of left foot 12/07/2020   Complication of anesthesia    had "breathing problems and ended up in ICU" 03/2010 report on chart   COPD (chronic obstructive pulmonary disease) (HCC)    Coronary atherosclerosis 03/13/2013   DDD (degenerative disc disease), lumbar    Depression    GERD (gastroesophageal reflux disease)    Gestational diabetes mellitus in pregnancy, diet controlled    Glucose intolerance (impaired glucose tolerance)    HLD (hyperlipidemia)    nec/nos   HTN (hypertension)    benign essential   MVP (mitral valve prolapse)    Narcolepsy    Osteopenia 12/25/2022   DEXA 12/2022: T score -1.7        Right cervical radiculopathy 08/09/2016   MRI at [facility] shows moderate-sized midline protrusions at C5-6 and C6-7, MRI disc returned to patient     Stroke [facility])    Suicide attempt (HCC)    hx   Syncope     admx to [facility] in 2/12: Echo with normal LVF; head CT unremarkable, ECG stable, no further w/u   Type II diabetes mellitus (HCC) 03/13/2013   Ulcer     Past Surgical History:  Procedure Laterality Date   AUGMENTATION MAMMAPLASTY  2003   BLADDER SUSPENSION  2002   DENTAL SURGERY  2016   Teeth implants x 4   GASTRIC BYPASS  2014   at [facility]   GASTRIC FUNDOPLICATION     LUMBAR LAMINECTOMY/DECOMPRESSION MICRODISCECTOMY  09/11/2011   Procedure: LUMBAR LAMINECTOMY/DECOMPRESSION MICRODISCECTOMY;  Surgeon: Jacki Cones;  Location: [facility];  Service: Orthopedics;  Laterality: Right;  Hemi Laminectomy/Microdiscectomy L5 - S1 on the Right (X-Ray)   REPAIR PERONEAL TENDONS ANKLE Left 2008   after injury    TOTAL ABDOMINAL HYSTERECTOMY  1979    Current Outpatient Medications on File Prior to Visit  Medication Sig Dispense Refill   AMBULATORY NON FORMULARY MEDICATION Walker. Use as needed. Disp 1.  Unable gait R26.81 1 each 0   amitriptyline (ELAVIL) 10 MG tablet Take 2 tablets by mouth every evening 60 tablet 11   amLODipine-olmesartan (AZOR) 10-40 MG tablet Take 1 tablet by mouth daily. 15 tablet 0   aspirin EC (ASPIRIN LOW DOSE) 81 MG tablet Take 1 tablet (81 mg total) by mouth daily. Swallow whole.  15 tablet 0   atorvastatin (LIPITOR) 80 MG tablet Take 1 tablet (80 mg total) by mouth daily. 15 tablet 0   busPIRone (BUSPAR) 10 MG tablet Take 1 tablet by mouth 3 times a day 90 tablet 11   cloNIDine (CATAPRES - DOSED IN MG/24 HR) 0.1 mg/24hr patch Place 1 patch on the skin once a week 4 patch 0   cloNIDine (CATAPRES - DOSED IN MG/24 HR) 0.3 mg/24hr patch Place 1 patch onto the skin once a week 4 patch 0   clopidogrel (PLAVIX) 75 MG tablet Take 1 tablet (75 mg total) by mouth daily. 15 tablet 0   diclofenac Sodium (VOLTAREN) 1 % GEL Apply 4 grams to the skin four times a day 500 g 11   DULoxetine (CYMBALTA) 60 MG capsule Take 1 capsule by mouth every evening 30 capsule 11    esomeprazole (NEXIUM) 40 MG capsule Take 40 mg by mouth daily.     finasteride (PROPECIA) 1 MG tablet Take by mouth.     furosemide (LASIX) 40 MG tablet Take 1 tablet (40 mg total) by mouth 2 (two) times daily. 30 tablet 0   gabapentin (NEURONTIN) 800 MG tablet Take 1 tablet by mouth 3 times a day 90 tablet 11   hydrALAZINE (APRESOLINE) 100 MG tablet Take 1 tablet (100 mg total) by mouth 3 (three) times daily. 45 tablet 0   ibuprofen (ADVIL) 800 MG tablet Take 1 tablet (800 mg total) by mouth every 6 (six) hours as needed. 60 tablet 1   labetalol (NORMODYNE) 300 MG tablet Take 2 tablets by mouth twice daily. 60 tablet 0   lidocaine (XYLOCAINE) 5 % ointment Apply 1 Application topically as needed. 35.44 g 0   MINOXIDIL, TOPICAL, 5 % SOLN Apply 1 application topically daily. 120 mL PRN   Multiple Vitamins-Minerals (MULTIVITAMINS THER. W/MINERALS) TABS Take 1 tablet by mouth daily.     nitroGLYCERIN (NITRODUR - DOSED IN MG/24 HR) 0.2 mg/hr patch Apply 1/4 patch daily to tendon for tendonitis. 0.2 patch 0   salicylic acid 17 % gel Apply topically daily. 15 g 0   spironolactone (ALDACTONE) 50 MG tablet Take 1 tablet (50 mg total) by mouth daily. 15 tablet 0   traZODone (DESYREL) 50 MG tablet Take 50 mg by mouth at bedtime.     urea (CARMOL) 40 % CREA Apply 1 Application topically 2 (two) times daily. 120 g 0   VENTOLIN HFA 108 (90 BASE) MCG/ACT inhaler prn     vitamin B-12 (CYANOCOBALAMIN) 500 MCG tablet Take by mouth.     No current facility-administered medications on file prior to visit.    Allergies  Allergen Reactions   Codeine Nausea And Vomiting and Nausea Only    Social History:  reports that she quit smoking about 20 years ago. Her smoking use included cigarettes. She started smoking about 35 years ago. She has a 7.5 pack-year smoking history. She has never used smokeless tobacco. She reports current alcohol use. She reports that she does not use drugs.  Family History  Problem  Relation Age of Onset   Diabetes Mother    Hypertension Mother    Heart attack Father 67   Hypertension Father    Stroke Other        grandmother   Colon cancer Neg Hx    Esophageal cancer Neg Hx    Stomach cancer Neg Hx    Rectal cancer Neg Hx     The following portions of the patient's history  were reviewed and updated as appropriate: allergies, current medications, past family history, past medical history, past social history, past surgical history and problem list.  Review of Systems Pertinent items noted in HPI and remainder of comprehensive ROS otherwise negative.  Physical Exam:  BP (!) 156/94   Pulse 71   Wt 197 lb (89.4 kg)   BMI 31.80 kg/m  CONSTITUTIONAL: Well-developed, well-nourished female in no acute distress.  SKIN: Skin is warm and dry. No rash noted. Not diaphoretic. No erythema. No pallor. MUSCULOSKELETAL: Normal range of motion. No tenderness.  No cyanosis, clubbing, or edema. NEUROLOGIC: Alert and oriented to person, place, and time. Normal muscle tone coordination.  PSYCHIATRIC: Normal mood and affect. Normal behavior. Normal judgment and thought content. CARDIOVASCULAR: Normal heart rate noted, regular rhythm RESPIRATORY: Clear to auscultation bilaterally. Effort and breath sounds normal, no problems with respiration noted. BREASTS: Symmetric in size, implants in place. No masses, tenderness, skin changes, nipple drainage, or lymphadenopathy bilaterally. Performed in the presence of a chaperone. ABDOMEN: Soft, no distention noted.  No tenderness, rebound or guarding.  PELVIC: Normal appearing external genitalia and urethral meatus with moderate atrophy; atrophic vaginal mucosa.No cervix noted.  No abnormal vaginal discharge noted.  No adnexal tenderness.  Performed in the presence of a chaperone.   Assessment and Plan:     1. Breast cancer screening by mammogram Mammogram scheduled for breast cancer screening. - MM 3D SCREENING MAMMOGRAM BILATERAL BREAST  W/IMPLANT; Future  2. Well woman exam with routine gynecological exam (Primary) She is s/p total hysterectomy, no need for pap smears  Colon cancer screening is up to date. Advised about ways to treat her hair growth, hair removal modalities. Recommended Dermatology for any further recommendations. Routine preventative health maintenance measures emphasized. Please refer to After Visit Summary for other counseling recommendations.      Jaynie Collins, MD, FACOG Obstetrician & Gynecologist, [facility] for [employer], [facility]

## 2024-01-13 ENCOUNTER — Telehealth: Payer: Self-pay

## 2024-01-13 NOTE — Telephone Encounter (Signed)
 Attempted to contact the patient. No answer. Left a brief vm msg that results are not available. A message was sent to the imaging department to expedite the results as a STAT on 12/09/23.

## 2024-01-13 NOTE — Telephone Encounter (Signed)
 Copied from CRM [id number]. Topic: Clinical - Lab/Test Results >> Jan 13, 2024  3:55 PM Corin V wrote: Reason for CRM: Patient stated that she has still not received the results of her foot MRI from March. Patient okay with results being sent via MyChart.

## 2024-01-16 ENCOUNTER — Other Ambulatory Visit: Payer: Self-pay | Admitting: Cardiology

## 2024-01-16 DIAGNOSIS — I10 Essential (primary) hypertension: Secondary | ICD-10-CM

## 2024-01-19 ENCOUNTER — Encounter: Payer: Self-pay | Admitting: Family Medicine

## 2024-01-19 DIAGNOSIS — M79672 Pain in left foot: Secondary | ICD-10-CM

## 2024-01-19 NOTE — Telephone Encounter (Signed)
 [facility] Radiology.

## 2024-01-19 NOTE — Progress Notes (Signed)
 Hi Haani, the MRI of your foot shows an inflammation of the tendon in the medial aspect of your Achilles.  It looks like it is chronic.  There is also some cartilage thinning in the AT joint area.  As well as some cartilage thinning in the middle and posterior subtalar joint.  Also some arthritis in several of the joints.  To get you in with either Dr. Elva Hamburger our sports med doc or refer you to one of our Ortho's that specializes in the foot for next steps if you are okay with that.

## 2024-02-06 ENCOUNTER — Other Ambulatory Visit: Payer: Self-pay | Admitting: Family Medicine

## 2024-02-11 ENCOUNTER — Ambulatory Visit (INDEPENDENT_AMBULATORY_CARE_PROVIDER_SITE_OTHER)

## 2024-02-11 DIAGNOSIS — Z1231 Encounter for screening mammogram for malignant neoplasm of breast: Secondary | ICD-10-CM

## 2024-02-12 ENCOUNTER — Telehealth: Payer: Self-pay | Admitting: Cardiology

## 2024-02-12 ENCOUNTER — Encounter: Payer: Self-pay | Admitting: Obstetrics & Gynecology

## 2024-02-12 DIAGNOSIS — I10 Essential (primary) hypertension: Secondary | ICD-10-CM

## 2024-02-12 MED ORDER — ASPIRIN 81 MG PO TBEC: 81.0000 mg | DELAYED_RELEASE_TABLET | Freq: Every day | ORAL | 4 refills | Status: DC

## 2024-02-12 MED ORDER — LABETALOL HCL 300 MG PO TABS: 600.0000 mg | ORAL_TABLET | Freq: Two times a day (BID) | ORAL | 4 refills | Status: DC

## 2024-02-12 NOTE — Telephone Encounter (Signed)
 Pt's medications were sent to pt's pharmacy as requested. Confirmation received.

## 2024-02-12 NOTE — Telephone Encounter (Signed)
*  STAT* If patient is at the pharmacy, call can be transferred to refill team.   1. Which medications need to be refilled? (please list name of each medication and dose if known)   aspirin  EC (ASPIRIN  LOW DOSE) 81 MG tablet    labetalol  (NORMODYNE ) 300 MG tablet     4. Which pharmacy/location (including street and city if local pharmacy) is medication to be sent to?  [facility] - [place], [place] - [address]     5. Do they need a 30 day or 90 day supply? 90  Scheduled for next available at [facility] 06/16/24 (added to waitlist as well)

## 2024-02-13 ENCOUNTER — Other Ambulatory Visit: Payer: Self-pay | Admitting: Cardiology

## 2024-04-04 ENCOUNTER — Other Ambulatory Visit: Payer: Self-pay | Admitting: Family Medicine

## 2024-04-28 ENCOUNTER — Other Ambulatory Visit: Payer: Self-pay | Admitting: Cardiology

## 2024-04-28 DIAGNOSIS — I10 Essential (primary) hypertension: Secondary | ICD-10-CM

## 2024-05-13 ENCOUNTER — Other Ambulatory Visit: Payer: Self-pay | Admitting: Cardiology

## 2024-05-13 DIAGNOSIS — I10 Essential (primary) hypertension: Secondary | ICD-10-CM

## 2024-05-14 MED ORDER — CLONIDINE 0.3 MG/24HR TD PTWK: 0.3000 mg | MEDICATED_PATCH | TRANSDERMAL | 0 refills | Status: DC

## 2024-05-14 MED ORDER — LABETALOL HCL 300 MG PO TABS: 600.0000 mg | ORAL_TABLET | Freq: Two times a day (BID) | ORAL | 0 refills | Status: DC

## 2024-05-14 MED ORDER — ASPIRIN 81 MG PO TBEC: 81.0000 mg | DELAYED_RELEASE_TABLET | Freq: Every day | ORAL | 0 refills | Status: DC

## 2024-05-14 MED ORDER — CLONIDINE 0.1 MG/24HR TD PTWK: MEDICATED_PATCH | TRANSDERMAL | 0 refills | Status: DC

## 2024-05-15 ENCOUNTER — Other Ambulatory Visit: Payer: Self-pay | Admitting: Cardiology

## 2024-05-15 DIAGNOSIS — I10 Essential (primary) hypertension: Secondary | ICD-10-CM

## 2024-06-03 NOTE — Progress Notes (Signed)
 HPI: FU hypertension. Previous renal Dopplers were technically difficult. There was no stenosis on the left and the right was not well visualized. Follow up CTA to rule out renal artery stenosis was done and was negative. Had CVA 2/22. CTA 2/22 showed no carotid stenosis. Monitor at [facility] 5/22 showed sinus with 10 and 11 beats NSVT. Echo 2/22 at [facility] showed normal LV function, neg saline microcavitation study, mild MR. Monitor April 2023 showed sinus rhythm with nonsustained ventricular tachycardia (longest 21 beats), PAT, PACs and PVCs.  Cardiac CTA June 2023 showed calcium  score 158 which was 91st percentile, mild stenosis in the proximal LAD, minimal disease in the left main, ramus intermedius, circumflex, OM1.  Since last seen she has dyspnea with more vigorous activities but not routine activities.  No orthopnea, PND, pedal edema, exertional chest pain or syncope.  Occasional brief flutters.  Occasional dizzy spells briefly that have been intermittent for years.  Current Outpatient Medications  Medication Sig Dispense Refill   AMBULATORY NON FORMULARY MEDICATION Walker. Use as needed. Disp 1.  Unable gait R26.81 1 each 0   amitriptyline  (ELAVIL ) 10 MG tablet Take 2 tablets by mouth every evening 60 tablet 11   amLODipine -olmesartan  (AZOR ) 10-40 MG tablet Take 1 tablet by mouth daily 90 tablet 0   aspirin  EC (ASPIRIN  LOW DOSE) 81 MG tablet Take 1 tablet (81 mg total) by mouth daily. Swallow whole. 30 tablet 0   atorvastatin  (LIPITOR) 80 MG tablet Take 1 tablet by mouth daily 90 tablet 0   busPIRone  (BUSPAR ) 10 MG tablet Take 1 tablet by mouth 3 times a day 90 tablet 11   cloNIDine  (CATAPRES  - DOSED IN MG/24 HR) 0.1 mg/24hr patch Place 1 patch on the skin once a week 4 patch 0   cloNIDine  (CATAPRES  - DOSED IN MG/24 HR) 0.3 mg/24hr patch Place 1 patch (0.3 mg total) onto the skin once a week. 4 patch 0   clopidogrel  (PLAVIX ) 75 MG tablet Take 1 tablet by mouth daily 90 tablet 0    diclofenac  Sodium (VOLTAREN ) 1 % GEL Apply 4 grams to the skin four times a day 500 g 11   DULoxetine  (CYMBALTA ) 60 MG capsule Take 1 capsule by mouth every evening 30 capsule 11   esomeprazole (NEXIUM) 40 MG capsule Take 40 mg by mouth daily.     finasteride  (PROPECIA ) 1 MG tablet Take by mouth.     furosemide  (LASIX ) 40 MG tablet Take 1 tablet by mouth twice daily 90 tablet 0   gabapentin  (NEURONTIN ) 800 MG tablet Take 1 tablet by mouth 3 times a day 90 tablet 11   hydrALAZINE  (APRESOLINE ) 100 MG tablet Take 1 tablet by mouth 3 times daily 90 tablet 0   ibuprofen  (ADVIL ) 800 MG tablet Take 1 tablet (800 mg total) by mouth every 6 (six) hours as needed. 60 tablet 1   labetalol  (NORMODYNE ) 300 MG tablet Take 2 tablets (600 mg total) by mouth 2 (two) times daily. 60 tablet 0   lidocaine  (XYLOCAINE ) 5 % ointment Apply 1 Application topically as needed. 35.44 g 0   MINOXIDIL , TOPICAL, 5 % SOLN Apply 1 application topically daily. 120 mL PRN   Multiple Vitamins-Minerals (MULTIVITAMINS THER. W/MINERALS) TABS Take 1 tablet by mouth daily.     nitroGLYCERIN  (NITRODUR - DOSED IN MG/24 HR) 0.2 mg/hr patch Apply 1/4 patch daily to tendon for tendonitis. 0.2 patch 0   salicylic acid  17 % gel Apply topically daily. 15 g 0  spironolactone  (ALDACTONE ) 50 MG tablet Take 1 tablet (50 mg total) by mouth daily. 15 tablet 0   traZODone (DESYREL) 50 MG tablet Take 50 mg by mouth at bedtime.     urea  (CARMOL) 40 % CREA Apply 1 Application topically 2 (two) times daily. 120 g 0   VENTOLIN  HFA 108 (90 BASE) MCG/ACT inhaler prn     vitamin B-12 (CYANOCOBALAMIN) 500 MCG tablet Take by mouth.     No current facility-administered medications for this visit.     Past Medical History:  Diagnosis Date   Allergy    Anemia    Anxiety    Bipolar disorder (HCC)    BIPOLAR DISORDER UNSPECIFIED 05/17/2009   Qualifier: Diagnosis of   By: Pietro, MD, CODY Redell Dimes        Cerebral amyloid angiopathy (CODE)  12/07/2020   CHF (congestive heart failure) (HCC)    Chronic diastolic heart failure (HCC)    hospitalized at [facility] regional   Closed nondisplaced fracture of phalanx of toe of left foot 12/07/2020   Complication of anesthesia    had breathing problems and ended up in ICU 03/2010 report on chart   COPD (chronic obstructive pulmonary disease) (HCC)    Coronary atherosclerosis 03/13/2013   DDD (degenerative disc disease), lumbar    Depression    GERD (gastroesophageal reflux disease)    Gestational diabetes mellitus in pregnancy, diet controlled    Glucose intolerance (impaired glucose tolerance)    HLD (hyperlipidemia)    nec/nos   HTN (hypertension)    benign essential   MVP (mitral valve prolapse)    Narcolepsy    Osteopenia 12/25/2022   DEXA 12/2022: T score -1.7        Right cervical radiculopathy 08/09/2016   MRI at [facility] shows moderate-sized midline protrusions at C5-6 and C6-7, MRI disc returned to patient     Stroke [facility])    Suicide attempt (HCC)    hx   Syncope    admx to [facility] in 2/12: Echo with normal LVF; head CT unremarkable, ECG stable, no further w/u   Type II diabetes mellitus (HCC) 03/13/2013   Ulcer     Past Surgical History:  Procedure Laterality Date   AUGMENTATION MAMMAPLASTY  2003   BLADDER SUSPENSION  2002   DENTAL SURGERY  2016   Teeth implants x 4   GASTRIC BYPASS  2014   at [facility]   GASTRIC FUNDOPLICATION     LUMBAR LAMINECTOMY/DECOMPRESSION MICRODISCECTOMY  09/11/2011   Procedure: LUMBAR LAMINECTOMY/DECOMPRESSION MICRODISCECTOMY;  Surgeon: Tanda DELENA Heading;  Location: [facility];  Service: Orthopedics;  Laterality: Right;  Hemi Laminectomy/Microdiscectomy L5 - S1 on the Right (X-Ray)   REPAIR PERONEAL TENDONS ANKLE Left 2008   after injury    TOTAL ABDOMINAL HYSTERECTOMY  1979    Social History   Socioeconomic History   Marital status: Married    Spouse name: Scientist, research (life sciences)   Number of children: 2   Years of education:  13   Highest education level: Some college, no degree  Occupational History   Occupation: legally disabled  Tobacco Use   Smoking status: Former    Current packs/day: 0.00    Average packs/day: 0.5 packs/day for 15.0 years (7.5 ttl pk-yrs)    Types: Cigarettes    Start date: 10/07/1988    Quit date: 10/08/2003    Years since quitting: 20.7   Smokeless tobacco: Never  Vaping Use   Vaping status: Never Used  Substance  and Sexual Activity   Alcohol use: Yes    Comment: rare   Drug use: No   Sexual activity: Not on file  Other Topics Concern   Not on file  Social History Narrative   Lives with her spouse and her son. She has two children. She enjoys watching television. She is not able to do a lot of since her stroke in January, 2022.   Social Drivers of Corporate investment banker Strain: Low Risk  (02/04/2024)   Received from [facility]   Overall Financial Resource Strain (CARDIA)    Difficulty of Paying Living Expenses: Not hard at all  Food Insecurity: No Food Insecurity (02/04/2024)   Received from [facility]   Hunger Vital Sign    Within the past 12 months, you worried that your food would run out before you got the money to buy more.: Never true    Within the past 12 months, the food you bought just didn't last and you didn't have money to get more.: Never true  Transportation Needs: No Transportation Needs (02/04/2024)   Received from [facility] - Transportation    Lack of Transportation (Medical): No    Lack of Transportation (Non-Medical): No  Physical Activity: Inactive (11/24/2023)   Exercise Vital Sign    Days of Exercise per Week: 0 days    Minutes of Exercise per Session: 0 min  Stress: No Stress Concern Present (11/24/2023)   [employer] of Occupational Health - Occupational Stress Questionnaire    Feeling of Stress : Only a little  Social Connections: Socially Integrated (11/24/2023)   Social Connection and Isolation Panel    Frequency of  Communication with Friends and Family: More than three times a week    Frequency of Social Gatherings with Friends and Family: More than three times a week    Attends Religious Services: More than 4 times per year    Active Member of [employer] or Organizations: Yes    Attends Engineer, structural: More than 4 times per year    Marital Status: Married  Catering manager Violence: Not At Risk (11/24/2023)   Humiliation, Afraid, Rape, and Kick questionnaire    Fear of Current or Ex-Partner: No    Emotionally Abused: No    Physically Abused: No    Sexually Abused: No    Family History  Problem Relation Age of Onset   Diabetes Mother    Hypertension Mother    Heart attack Father 46   Hypertension Father    Stroke Other        grandmother   Colon cancer Neg Hx    Esophageal cancer Neg Hx    Stomach cancer Neg Hx    Rectal cancer Neg Hx     ROS: no fevers or chills, productive cough, hemoptysis, dysphasia, odynophagia, melena, hematochezia, dysuria, hematuria, rash, seizure activity, orthopnea, PND, pedal edema, claudication. Remaining systems are negative.  Physical Exam: Well-developed well-nourished in no acute distress.  Skin is warm and dry.  HEENT is normal.  Neck is supple.  Chest is clear to auscultation with normal expansion.  Cardiovascular exam is regular rate and rhythm.  Abdominal exam nontender or distended. No masses palpated. Extremities show no edema. neuro grossly intact  EKG Interpretation Date/Time:  Wednesday June 16 2024 15:18:23 EDT Ventricular Rate:  76 PR Interval:  160 QRS Duration:  72 QT Interval:  378 QTC Calculation: 425 R Axis:   85  Text Interpretation: Normal sinus  rhythm Possible Left atrial enlargement Nonspecific T wave abnormality Confirmed by Pietro Rogue ([id number]) on 06/16/2024 3:30:24 PM    A/P  1 coronary artery disease-mild on previous CTA.  Continue medical therapy with aspirin  and statin.  She denies chest pain.  2  hypertension-blood pressure controlled.  Continue present medications.  Check potassium and renal function.  3 hyperlipidemia-continue statin.  Check lipids and liver.  4 history of palpitations-continue beta-blocker.  5 history of nonsustained ventricular tachycardia-LV function is normal on her echocardiogram.  Continue beta-blocker at present dose.  6 prior CVA-continue aspirin  and Plavix .  She has no history of documented atrial fibrillation.  7 history of chest pain-no recurrences since last office visit.  Previous CTA showed mild coronary disease.  Rogue Pietro, MD

## 2024-06-06 ENCOUNTER — Other Ambulatory Visit: Payer: Self-pay | Admitting: Family Medicine

## 2024-06-16 ENCOUNTER — Encounter: Payer: Self-pay | Admitting: Cardiology

## 2024-06-16 ENCOUNTER — Ambulatory Visit (INDEPENDENT_AMBULATORY_CARE_PROVIDER_SITE_OTHER): Admitting: Cardiology

## 2024-06-16 VITALS — BP 120/60 | HR 76 | Ht 66.0 in | Wt 195.0 lb

## 2024-06-16 DIAGNOSIS — E7800 Pure hypercholesterolemia, unspecified: Secondary | ICD-10-CM | POA: Diagnosis not present

## 2024-06-16 DIAGNOSIS — I2510 Atherosclerotic heart disease of native coronary artery without angina pectoris: Secondary | ICD-10-CM | POA: Diagnosis not present

## 2024-06-16 DIAGNOSIS — I10 Essential (primary) hypertension: Secondary | ICD-10-CM | POA: Diagnosis not present

## 2024-06-16 NOTE — Patient Instructions (Signed)
 Medication Instructions:   NO CHANGE  *If you need a refill on your cardiac medications before your next appointment, please call your pharmacy*  Lab Work:  Your physician recommends that you return for lab work FASTING  If you have labs (blood work) drawn today and your tests are completely normal, you will receive your results only by: MyChart Message (if you have MyChart) OR A paper copy in the mail If you have any lab test that is abnormal or we need to change your treatment, we will call you to review the results.   Follow-Up: At [facility], you and your health needs are our priority.  As part of our continuing mission to provide you with exceptional heart care, our providers are all part of one team.  This team includes your primary Cardiologist (physician) and Advanced Practice Providers or APPs (Physician Assistants and Nurse Practitioners) who all work together to provide you with the care you need, when you need it.  Your next appointment:   12 month(s)  Provider:   Alexandria Angel, MD

## 2024-06-23 ENCOUNTER — Ambulatory Visit: Payer: Self-pay | Admitting: Cardiology

## 2024-06-23 LAB — COMPREHENSIVE METABOLIC PANEL WITH GFR
ALT: 14 IU/L (ref 0–32)
AST: 15 IU/L (ref 0–40)
Albumin: 4.3 g/dL (ref 3.9–4.9)
Alkaline Phosphatase: 127 IU/L (ref 49–135)
BUN/Creatinine Ratio: 10 — ABNORMAL LOW (ref 12–28)
BUN: 9 mg/dL (ref 8–27)
Bilirubin Total: 0.7 mg/dL (ref 0.0–1.2)
CO2: 23 mmol/L (ref 20–29)
Calcium: 9.2 mg/dL (ref 8.7–10.3)
Chloride: 107 mmol/L — ABNORMAL HIGH (ref 96–106)
Creatinine, Ser: 0.87 mg/dL (ref 0.57–1.00)
Globulin, Total: 2 g/dL (ref 1.5–4.5)
Glucose: 104 mg/dL — ABNORMAL HIGH (ref 70–99)
Potassium: 3.7 mmol/L (ref 3.5–5.2)
Sodium: 143 mmol/L (ref 134–144)
Total Protein: 6.3 g/dL (ref 6.0–8.5)
eGFR: 73 mL/min/1.73 (ref >59)

## 2024-06-23 LAB — LIPID PANEL
Chol/HDL Ratio: 2.5 ratio (ref 0.0–4.4)
Cholesterol, Total: 124 mg/dL (ref 100–199)
HDL: 50 mg/dL (ref >39)
LDL Chol Calc (NIH): 60 mg/dL (ref 0–99)
Triglycerides: 67 mg/dL (ref 0–149)
VLDL Cholesterol Cal: 14 mg/dL (ref 5–40)

## 2024-06-24 ENCOUNTER — Ambulatory Visit (INDEPENDENT_AMBULATORY_CARE_PROVIDER_SITE_OTHER): Admitting: Family Medicine

## 2024-06-24 VITALS — BP 106/67 | HR 73 | Ht 66.0 in | Wt 197.0 lb

## 2024-06-24 DIAGNOSIS — M8580 Other specified disorders of bone density and structure, unspecified site: Secondary | ICD-10-CM | POA: Diagnosis not present

## 2024-06-24 DIAGNOSIS — E119 Type 2 diabetes mellitus without complications: Secondary | ICD-10-CM

## 2024-06-24 DIAGNOSIS — R2242 Localized swelling, mass and lump, left lower limb: Secondary | ICD-10-CM

## 2024-06-24 DIAGNOSIS — I10 Essential (primary) hypertension: Secondary | ICD-10-CM | POA: Diagnosis not present

## 2024-06-24 LAB — POCT GLYCOSYLATED HEMOGLOBIN (HGB A1C): HbA1c, POC (controlled diabetic range): 5.7 % (ref 0.0–7.0)

## 2024-06-24 NOTE — Progress Notes (Unsigned)
 Established Patient Office Visit  Subjective  Patient ID: [name], female    DOB: Oct 22, 1955  Age: 68 y.o. MRN: [id number]  Chief Complaint  Patient presents with   Diabetes    HPI  Discussed the use of AI scribe software for clinical note transcription with the patient, who gave verbal consent to proceed.  History of Present Illness [name] is a 68 year old female who presents with foot pain due to a mass and upcoming surgery.  Foot pain and mass - Severe pain in the foot due to a mass located between the nerves, confirmed by MRI with contrast - Pain is most pronounced at night, resulting in significant sleep disturbance - Describes the pain as 'really sore' and is 'up half of the night' - Unable to bear weight on the affected foot, ambulates on 'tippy toes' - Preparing for upcoming surgery on the foot - Has acquired a scooter for post-surgical mobility - Home environment is flat with no stairs, facilitating recovery  Glycemic control - Diabetes diagnosed in 2014 - A1c consistently in the prediabetes range, most recent value 5.7 - Glycemic control maintained through dietary management - No recent issues with blood glucose levels  Liver enzyme abnormality - Elevated alkaline phosphatase in March, now normalized - Family history of liver disease (daughter with fatty liver disease)  Visual changes - Eye examination completed in 2023, prior to cerebrovascular accident - Vision changes since stroke, plans to update lenses - Intends to replace only the lenses in existing designer frames  Dietary habits and weight gain - High intake of [employer], attributed to weight gain - Desires to reduce consumption     ROS    Objective:     BP 106/67   Pulse 73   Ht 5' 6 (1.676 m)   Wt 197 lb (89.4 kg)   SpO2 98%   BMI 31.80 kg/m    Physical Exam Vitals and nursing note reviewed.  Constitutional:      Appearance: Normal appearance.  HENT:     Head:  Normocephalic and atraumatic.  Eyes:     Conjunctiva/sclera: Conjunctivae normal.  Cardiovascular:     Rate and Rhythm: Normal rate and regular rhythm.  Pulmonary:     Effort: Pulmonary effort is normal.     Breath sounds: Normal breath sounds.  Skin:    General: Skin is warm and dry.  Neurological:     Mental Status: She is alert.  Psychiatric:        Mood and Affect: Mood normal.      Results for orders placed or performed in visit on 06/24/24  POCT HgB A1C  Result Value Ref Range   Hemoglobin A1C     HbA1c POC (<> result, manual entry)     HbA1c, POC (prediabetic range)     HbA1c, POC (controlled diabetic range) 5.7 0.0 - 7.0 %      The ASCVD Risk score (Arnett DK, et al., 2019) failed to calculate for the following reasons:   Risk score cannot be calculated because patient has a medical history suggesting prior/existing ASCVD    Assessment & Plan:   Problem List Items Addressed This Visit       Cardiovascular and Mediastinum   Essential hypertension, malignant     Endocrine   Type II diabetes mellitus (HCC) - Primary   Relevant Orders   POCT HgB A1C (Completed)     Musculoskeletal and Integument   Osteopenia   Due for  repeat DEXA in 6 months March 2026.      Other Visit Diagnoses       Foot mass, left          Assessment and Plan Assessment & Plan Foot mass with pain Chronic painful mass between foot nerves causing discomfort and sleep disturbance. MRI confirmed mass. Previous surgery required deep intervention, current mass affects foot anchor. - Correct paperwork for hospital surgery. - Non-weight bearing for 8 weeks post-surgery. - Send excised tissue for analysis.  Essential hypertension Blood pressure well-controlled.  Type 2 diabetes mellitus, diet controlled Diagnosed in 2014, managed with diet. A1c 5.7, indicating good control.  Liver enzyme elevation Previous alkaline phosphatase elevation normalized. AST and ALT normal. - Monitor  liver enzyme levels in future lab work.   Return in about 4 months (around 10/24/2024) for Diabetes follow-up, Hypertension.    Dorothyann Byars, MD

## 2024-06-25 ENCOUNTER — Encounter: Payer: Self-pay | Admitting: Family Medicine

## 2024-06-25 NOTE — Assessment & Plan Note (Signed)
 Due for repeat DEXA in 6 months March 2026.

## 2024-06-30 ENCOUNTER — Telehealth: Payer: Self-pay

## 2024-06-30 NOTE — Telephone Encounter (Signed)
   Pre-operative Risk Assessment    Patient Name: [name]  DOB: [birth year] MRN: [id number]   Date of last office visit: 06/16/24 Date of next office visit:    Request for Surgical Clearance    Procedure:  Foot surgery   Date of Surgery:  Clearance 07/15/24                                 Surgeon:  Dr. Toribio Primes Surgeon's Group or Practice Name:  [facility]  Phone number:  [phone] Fax number:  [phone]   Type of Clearance Requested:   - Medical  - Pharmacy:  Hold Aspirin  and Clopidogrel  (Plavix ) Not indicated    Type of Anesthesia:  Not Indicated   Additional requests/questions:    [name]   06/30/2024, 2:30 PM

## 2024-06-30 NOTE — Telephone Encounter (Signed)
   Patient Name: [name]  DOB: [birth year] MRN: [id number]  Primary Cardiologist: Redell Shallow, MD  Chart reviewed as part of pre-operative protocol coverage. Given past medical history and time since last visit, based on ACC/AHA guidelines, [name] is at acceptable risk for the planned procedure without further cardiovascular testing.   Needs recommendations from neurology concerning Plavix  hold due to hx of CVA. Dr.John C. Morris.   Per office protocol, if patient is without any new symptoms or concerns at the time of their virtual visit, she may hold ASA for 7 days prior to procedure. Please resume ASA as soon as possible postprocedure, at the discretion of the surgeon.    The patient was advised that if she develops new symptoms prior to surgery to contact our office to arrange for a follow-up visit, and she verbalized understanding.  I will route this recommendation to the requesting party via Epic fax function and remove from pre-op pool.  Please call with questions.  Lamarr Satterfield, NP 06/30/2024, 2:47 PM

## 2024-07-02 ENCOUNTER — Other Ambulatory Visit: Payer: Self-pay | Admitting: Cardiology

## 2024-07-02 DIAGNOSIS — I10 Essential (primary) hypertension: Secondary | ICD-10-CM

## 2024-07-02 NOTE — Telephone Encounter (Signed)
 Does pt supposed to be using both clonidine  0.1 and 0.3 mg 24 hr patch per week? Please address

## 2024-07-30 ENCOUNTER — Other Ambulatory Visit: Payer: Self-pay | Admitting: Cardiology

## 2024-07-30 ENCOUNTER — Other Ambulatory Visit: Payer: Self-pay | Admitting: Internal Medicine

## 2024-07-30 DIAGNOSIS — I10 Essential (primary) hypertension: Secondary | ICD-10-CM

## 2024-08-18 ENCOUNTER — Other Ambulatory Visit: Payer: Self-pay | Admitting: Internal Medicine

## 2024-09-28 ENCOUNTER — Other Ambulatory Visit: Payer: Self-pay | Admitting: Family Medicine

## 2024-10-14 ENCOUNTER — Ambulatory Visit (INDEPENDENT_AMBULATORY_CARE_PROVIDER_SITE_OTHER): Admitting: Obstetrics and Gynecology

## 2024-10-14 ENCOUNTER — Encounter: Payer: Self-pay | Admitting: Obstetrics and Gynecology

## 2024-10-14 VITALS — BP 146/85 | HR 79 | Wt 188.0 lb

## 2024-10-14 DIAGNOSIS — N9089 Other specified noninflammatory disorders of vulva and perineum: Secondary | ICD-10-CM | POA: Diagnosis not present

## 2024-10-14 NOTE — Progress Notes (Signed)
 "  GYNECOLOGY OFFICE VISIT NOTE  History:  [name] is a 69 y.o. G1P0101 here today for bumps on her vulva for the last week that she has noticed. They are bothersome to her. She thinks they started after having a waxing. She would like they removed if possible as they are bothersome to her.    Past Medical History:  Diagnosis Date   Allergy    Anemia    Anxiety    Bipolar disorder (HCC)    BIPOLAR DISORDER UNSPECIFIED 05/17/2009   Qualifier: Diagnosis of   By: Pietro, MD, CODY Redell Dimes        Cerebral amyloid angiopathy (CODE) 12/07/2020   CHF (congestive heart failure) (HCC)    Chronic diastolic heart failure (HCC)    hospitalized at [facility]   Closed nondisplaced fracture of phalanx of toe of left foot 12/07/2020   Complication of anesthesia    had breathing problems and ended up in ICU 03/2010 report on chart   COPD (chronic obstructive pulmonary disease) (HCC)    Coronary atherosclerosis 03/13/2013   DDD (degenerative disc disease), lumbar    Depression    GERD (gastroesophageal reflux disease)    Gestational diabetes mellitus in pregnancy, diet controlled    Glucose intolerance (impaired glucose tolerance)    HLD (hyperlipidemia)    nec/nos   HTN (hypertension)    benign essential   MVP (mitral valve prolapse)    Narcolepsy    Osteopenia 12/25/2022   DEXA 12/2022: T score -1.7        Right cervical radiculopathy 08/09/2016   MRI at [facility] shows moderate-sized midline protrusions at C5-6 and C6-7, MRI disc returned to patient     Stroke [facility])    Suicide attempt (HCC)    hx   Syncope    admx to [facility] in 2/12: Echo with normal LVF; head CT unremarkable, ECG stable, no further w/u   Type II diabetes mellitus (HCC) 03/13/2013   Ulcer     Past Surgical History:  Procedure Laterality Date   AUGMENTATION MAMMAPLASTY  2003   BLADDER SUSPENSION  2002   DENTAL SURGERY  2016   Teeth implants x 4   GASTRIC BYPASS  2014   at [facility]   GASTRIC FUNDOPLICATION     LUMBAR LAMINECTOMY/DECOMPRESSION MICRODISCECTOMY  09/11/2011   Procedure: LUMBAR LAMINECTOMY/DECOMPRESSION MICRODISCECTOMY;  Surgeon: Tanda DELENA Heading;  Location: [facility];  Service: Orthopedics;  Laterality: Right;  Hemi Laminectomy/Microdiscectomy L5 - S1 on the Right (X-Ray)   REPAIR PERONEAL TENDONS ANKLE Left 2008   after injury    TOTAL ABDOMINAL HYSTERECTOMY  1979    The following portions of the patient's history were reviewed and updated as appropriate: allergies, current medications, past family history, past medical history, past social history, past surgical history and problem list.   Review of Systems:  Pertinent items noted in HPI and remainder of comprehensive ROS otherwise negative.  Physical Exam:  BP (!) 146/85   Pulse 79   Wt 188 lb (85.3 kg)   BMI 30.34 kg/m  CONSTITUTIONAL: Well-developed, well-nourished female in no acute distress.  HEENT:  Normocephalic, atraumatic. External right and left ear normal. No scleral icterus.  NECK: Normal range of motion, supple, no masses noted on observation SKIN: No rash noted. Not diaphoretic. No erythema. No pallor. MUSCULOSKELETAL: Normal range of motion. No edema noted. NEUROLOGIC: Alert and oriented to person, place, and time. Normal muscle tone coordination. No cranial nerve deficit noted.  PSYCHIATRIC: Normal mood and affect. Normal behavior. Normal judgment and thought content.  PELVIC: Small sebaceous cysts on labia minora. She requests removal.   I&D NOTE The indications for I&D  were reviewed - sebaceous cysts.   Risks include pain, bleeding, infection, scarring and need for additional procedures were discussed. The patient stated understanding and agreed to undergo procedure today. Consent was signed, time out performed.   The patient's skin over the abscess was prepped with Betadine  and lidocaine  1% injected for about 0.5 ml. A stab incision was made with an 11 blade scalpel.  The  contents were drained. Cyst walls removed with hemostat.   The patient tolerated the procedure well.   Post-procedure instructions and ABX were given to the patient. The patient is to call with heavy bleeding, fever greater than 100.4, foul smelling vaginal discharge or other concerns.   Assessment and Plan:  1. Vulvar lesion (Primary) I&D done today per pt request. Cyst walls removed. Reviewed care instructions.    No orders of the defined types were placed in this encounter.    Routine preventative health maintenance measures emphasized. Please refer to After Visit Summary for other counseling recommendations.   Return if symptoms worsen or fail to improve.  Vina Solian, MD, FACOG Obstetrician & Gynecologist, [facility] for [facility], [facility]      "

## 2024-10-21 ENCOUNTER — Ambulatory Visit (INDEPENDENT_AMBULATORY_CARE_PROVIDER_SITE_OTHER): Admitting: Family Medicine

## 2024-10-21 ENCOUNTER — Encounter: Payer: Self-pay | Admitting: Family Medicine

## 2024-10-21 VITALS — BP 130/79 | HR 78 | Ht 66.0 in | Wt 192.0 lb

## 2024-10-21 DIAGNOSIS — E1159 Type 2 diabetes mellitus with other circulatory complications: Secondary | ICD-10-CM | POA: Diagnosis not present

## 2024-10-21 DIAGNOSIS — I509 Heart failure, unspecified: Secondary | ICD-10-CM | POA: Diagnosis not present

## 2024-10-21 DIAGNOSIS — I152 Hypertension secondary to endocrine disorders: Secondary | ICD-10-CM

## 2024-10-21 DIAGNOSIS — G43909 Migraine, unspecified, not intractable, without status migrainosus: Secondary | ICD-10-CM | POA: Diagnosis not present

## 2024-10-21 LAB — POCT GLYCOSYLATED HEMOGLOBIN (HGB A1C): Hemoglobin A1C: 5.9 % — AB (ref 4.0–5.6)

## 2024-10-21 MED ORDER — DULOXETINE HCL 30 MG PO CPEP: ORAL_CAPSULE | ORAL | 0 refills | Status: AC

## 2024-10-21 NOTE — Assessment & Plan Note (Signed)
 Has not had a significant migraine in years and so would like to taper off of amitriptyline  she says is what it was initially started for she typically takes 20 mg daily. Did discuss how to taper off of the medication.   History of migraine No migraines for ten years. On amitriptyline  10 mg for prevention. Decision to discontinue due to long-term use and lack of recent migraines. - Taper off amitriptyline  by reducing to one tablet nightly for a couple of weeks, then half a tablet nightly for another week or two, then stop.

## 2024-10-21 NOTE — Assessment & Plan Note (Addendum)
 Essential hypertension, malignant Blood pressure well-controlled. Hydralazine  prescribed by cardiologist. Discontinuation discussion deferred to cardiologist. - Discuss discontinuation of hydralazine  with cardiologist at upcoming appointment.   Type 2 diabetes mellitus Well-controlled with A1c of 5.9, aiding post-surgical healing.

## 2024-10-21 NOTE — Progress Notes (Signed)
 "  Established Patient Office Visit  Patient ID: [name], female    DOB: 10/31/1955  Age: 69 y.o. MRN: [id number] PCP: Alvan Theresa BIRCH, MD  Chief Complaint  Patient presents with   Hypertension   ifg    Subjective:     HPI  Discussed the use of AI scribe software for clinical note transcription with the patient, who gave verbal consent to proceed.  History of Present Illness [name] is a 69 year old female who presents with persistent drainage from a foot wound post-surgery.  Foot wound drainage - Persistent drainage from a foot wound with 'cream cheese' consistency - Drainage began a few days prior to recent surgery and has continued postoperatively - Wound has formed a canal extending to the top of the foot, which was cleaned during surgery - Continued concern regarding the unusual nature of the discharge  Postoperative course and immobilization - Underwent recent surgery to address the foot wound - MRI performed recently for further evaluation - Removable cast in place for over a month following a period in a hard cast - During hard cast period, unable to shower or bear weight on the foot - Experienced a fall while wearing the cast  Foot pain - Significant ongoing pain in the affected foot - Managing pain with duloxetine  - Considering tapering off duloxetine   Medication use and management - History of amitriptyline  use for migraines, which have not occurred in ten years; plans to discontinue amitriptyline  - Currently using clonidine  patches with confusion regarding dosage due to receiving two different strengths     ROS    Objective:     BP 130/79   Pulse 78   Ht 5' 6 (1.676 m)   Wt 192 lb (87.1 kg)   SpO2 97%   BMI 30.99 kg/m    Physical Exam Vitals and nursing note reviewed.  Constitutional:      Appearance: Normal appearance.  HENT:     Head: Normocephalic and atraumatic.  Eyes:     Conjunctiva/sclera: Conjunctivae normal.   Cardiovascular:     Rate and Rhythm: Normal rate and regular rhythm.  Pulmonary:     Effort: Pulmonary effort is normal.     Breath sounds: Normal breath sounds.  Skin:    General: Skin is warm and dry.  Neurological:     Mental Status: She is alert.  Psychiatric:        Mood and Affect: Mood normal.      Results for orders placed or performed in visit on 10/21/24  POCT HgB A1C  Result Value Ref Range   Hemoglobin A1C 5.9 (A) 4.0 - 5.6 %   HbA1c POC (<> result, manual entry)     HbA1c, POC (prediabetic range)     HbA1c, POC (controlled diabetic range)        The ASCVD Risk score (Arnett DK, et al., 2019) failed to calculate for the following reasons:   Risk score cannot be calculated because patient has a medical history suggesting prior/existing ASCVD   * - Cholesterol units were assumed    Assessment & Plan:   Problem List Items Addressed This Visit       Cardiovascular and Mediastinum   Migraine headache   Has not had a significant migraine in years and so would like to taper off of amitriptyline  she says is what it was initially started for she typically takes 20 mg daily. Did discuss how to taper off of the medication.  History of migraine No migraines for ten years. On amitriptyline  10 mg for prevention. Decision to discontinue due to long-term use and lack of recent migraines. - Taper off amitriptyline  by reducing to one tablet nightly for a couple of weeks, then half a tablet nightly for another week or two, then stop.       Relevant Medications   DULoxetine  (CYMBALTA ) 30 MG capsule   Hypertension associated with diabetes (HCC) - Primary   Essential hypertension, malignant Blood pressure well-controlled. Hydralazine  prescribed by cardiologist. Discontinuation discussion deferred to cardiologist. - Discuss discontinuation of hydralazine  with cardiologist at upcoming appointment.   Type 2 diabetes mellitus Well-controlled with A1c of 5.9, aiding  post-surgical healing.       Relevant Orders   POCT HgB A1C (Completed)   Congestive heart failure (HCC)    Assessment and Plan Assessment & Plan Foot mass, post-surgical, with persistent drainage and pain Persistent drainage and pain post-surgery. Drainage not indicative of infection or osteomyelitis. Awaiting specialist evaluation. No signs of infection or cancer. - Continue wearing cast until follow-up with specialist. - Await referral to plastic surgeon and cancer specialist.   Needs to schedule an eye exam.    We did not do foot exam today because she has a postop shoe after her recent heel surgery so we will wait till that heals before we do a monofilament exam   Return in about 6 months (around 04/20/2025).    Theresa Byars, MD [facility] at [facility]   "

## 2024-10-21 NOTE — Patient Instructions (Signed)
 Decrease amitriptyline  to one tab daily for 10 days and then 1/2 tab daily for 8 days and then stop.

## 2024-10-24 ENCOUNTER — Other Ambulatory Visit: Payer: Self-pay | Admitting: Family Medicine

## 2024-11-24 ENCOUNTER — Ambulatory Visit: Payer: Medicare Other

## 2025-04-20 ENCOUNTER — Ambulatory Visit: Admitting: Family Medicine
